# Patient Record
Sex: Male | Born: 1950
Health system: Southern US, Community
[De-identification: ages and names within clinical notes are randomized; demographics above are authoritative.]

## PROBLEM LIST (undated history)

## (undated) DIAGNOSIS — M545 Low back pain, unspecified: Secondary | ICD-10-CM

## (undated) DIAGNOSIS — K529 Noninfective gastroenteritis and colitis, unspecified: Secondary | ICD-10-CM

## (undated) DIAGNOSIS — M1990 Unspecified osteoarthritis, unspecified site: Secondary | ICD-10-CM

## (undated) DIAGNOSIS — I10 Essential (primary) hypertension: Secondary | ICD-10-CM

## (undated) DIAGNOSIS — J449 Chronic obstructive pulmonary disease, unspecified: Secondary | ICD-10-CM

## (undated) DIAGNOSIS — R51 Headache: Secondary | ICD-10-CM

## (undated) DIAGNOSIS — G629 Polyneuropathy, unspecified: Secondary | ICD-10-CM

## (undated) DIAGNOSIS — R0600 Dyspnea, unspecified: Secondary | ICD-10-CM

## (undated) DIAGNOSIS — I2510 Atherosclerotic heart disease of native coronary artery without angina pectoris: Secondary | ICD-10-CM

## (undated) DIAGNOSIS — R519 Headache, unspecified: Secondary | ICD-10-CM

## (undated) DIAGNOSIS — I219 Acute myocardial infarction, unspecified: Secondary | ICD-10-CM

## (undated) DIAGNOSIS — M542 Cervicalgia: Secondary | ICD-10-CM

## (undated) DIAGNOSIS — J189 Pneumonia, unspecified organism: Secondary | ICD-10-CM

## (undated) DIAGNOSIS — Z5181 Encounter for therapeutic drug level monitoring: Secondary | ICD-10-CM

## (undated) DIAGNOSIS — Z720 Tobacco use: Secondary | ICD-10-CM

## (undated) DIAGNOSIS — R918 Other nonspecific abnormal finding of lung field: Secondary | ICD-10-CM

## (undated) DIAGNOSIS — G709 Myoneural disorder, unspecified: Secondary | ICD-10-CM

## (undated) DIAGNOSIS — F1410 Cocaine abuse, uncomplicated: Secondary | ICD-10-CM

## (undated) DIAGNOSIS — J439 Emphysema, unspecified: Secondary | ICD-10-CM

## (undated) DIAGNOSIS — K219 Gastro-esophageal reflux disease without esophagitis: Secondary | ICD-10-CM

## (undated) DIAGNOSIS — M549 Dorsalgia, unspecified: Secondary | ICD-10-CM

## (undated) HISTORY — DX: Low back pain: M54.5

## (undated) HISTORY — DX: Gastro-esophageal reflux disease without esophagitis: K21.9

## (undated) HISTORY — DX: Tobacco use: Z72.0

## (undated) HISTORY — PX: COLONOSCOPY: SHX174

## (undated) HISTORY — DX: Cervicalgia: M54.2

## (undated) HISTORY — DX: Emphysema, unspecified: J43.9

## (undated) HISTORY — DX: Encounter for therapeutic drug level monitoring: Z51.81

## (undated) HISTORY — PX: CARDIAC CATHETERIZATION: SHX172

## (undated) HISTORY — DX: Dorsalgia, unspecified: M54.9

## (undated) HISTORY — DX: Chronic obstructive pulmonary disease, unspecified: J44.9

## (undated) HISTORY — PX: NO PAST SURGERIES: SHX2092

## (undated) HISTORY — DX: Low back pain, unspecified: M54.50

## (undated) HISTORY — DX: Noninfective gastroenteritis and colitis, unspecified: K52.9

## (undated) HISTORY — DX: Other nonspecific abnormal finding of lung field: R91.8

## (undated) HISTORY — DX: Cocaine abuse, uncomplicated: F14.10

---

## 1998-07-06 ENCOUNTER — Emergency Department (HOSPITAL_COMMUNITY): Admission: EM | Admit: 1998-07-06 | Discharge: 1998-07-06 | Payer: Self-pay | Admitting: Emergency Medicine

## 1998-07-10 ENCOUNTER — Emergency Department (HOSPITAL_COMMUNITY): Admission: EM | Admit: 1998-07-10 | Discharge: 1998-07-10 | Payer: Self-pay | Admitting: *Deleted

## 1998-07-10 ENCOUNTER — Encounter: Payer: Self-pay | Admitting: *Deleted

## 1999-12-19 ENCOUNTER — Encounter: Payer: Self-pay | Admitting: *Deleted

## 1999-12-19 ENCOUNTER — Encounter (INDEPENDENT_AMBULATORY_CARE_PROVIDER_SITE_OTHER): Payer: Self-pay | Admitting: *Deleted

## 1999-12-20 ENCOUNTER — Inpatient Hospital Stay (HOSPITAL_COMMUNITY): Admission: EM | Admit: 1999-12-20 | Discharge: 1999-12-21 | Payer: Self-pay | Admitting: *Deleted

## 2000-03-02 ENCOUNTER — Ambulatory Visit (HOSPITAL_COMMUNITY): Admission: RE | Admit: 2000-03-02 | Discharge: 2000-03-02 | Payer: Self-pay | Admitting: Gastroenterology

## 2000-04-24 ENCOUNTER — Encounter: Payer: Self-pay | Admitting: General Surgery

## 2000-04-24 ENCOUNTER — Encounter: Admission: RE | Admit: 2000-04-24 | Discharge: 2000-04-24 | Payer: Self-pay | Admitting: General Surgery

## 2000-05-13 ENCOUNTER — Emergency Department (HOSPITAL_COMMUNITY): Admission: EM | Admit: 2000-05-13 | Discharge: 2000-05-13 | Payer: Self-pay | Admitting: Emergency Medicine

## 2001-07-03 ENCOUNTER — Encounter: Admission: RE | Admit: 2001-07-03 | Discharge: 2001-07-03 | Payer: Self-pay | Admitting: Internal Medicine

## 2001-07-03 ENCOUNTER — Encounter: Payer: Self-pay | Admitting: Internal Medicine

## 2002-05-11 ENCOUNTER — Encounter: Payer: Self-pay | Admitting: Emergency Medicine

## 2002-05-11 ENCOUNTER — Emergency Department (HOSPITAL_COMMUNITY): Admission: EM | Admit: 2002-05-11 | Discharge: 2002-05-11 | Payer: Self-pay | Admitting: Emergency Medicine

## 2002-07-05 ENCOUNTER — Inpatient Hospital Stay (HOSPITAL_COMMUNITY): Admission: EM | Admit: 2002-07-05 | Discharge: 2002-07-06 | Payer: Self-pay | Admitting: Emergency Medicine

## 2002-07-05 ENCOUNTER — Encounter: Payer: Self-pay | Admitting: Emergency Medicine

## 2002-07-06 ENCOUNTER — Encounter: Payer: Self-pay | Admitting: Neurology

## 2004-01-27 ENCOUNTER — Emergency Department (HOSPITAL_COMMUNITY): Admission: EM | Admit: 2004-01-27 | Discharge: 2004-01-27 | Payer: Self-pay | Admitting: Emergency Medicine

## 2005-10-09 ENCOUNTER — Emergency Department (HOSPITAL_COMMUNITY): Admission: EM | Admit: 2005-10-09 | Discharge: 2005-10-09 | Payer: Self-pay | Admitting: Emergency Medicine

## 2007-05-21 ENCOUNTER — Emergency Department (HOSPITAL_COMMUNITY): Admission: EM | Admit: 2007-05-21 | Discharge: 2007-05-21 | Payer: Self-pay | Admitting: Emergency Medicine

## 2008-08-19 ENCOUNTER — Encounter: Admission: RE | Admit: 2008-08-19 | Discharge: 2008-08-19 | Payer: Self-pay | Admitting: Internal Medicine

## 2009-05-27 ENCOUNTER — Emergency Department (HOSPITAL_COMMUNITY): Admission: EM | Admit: 2009-05-27 | Discharge: 2009-05-27 | Payer: Self-pay | Admitting: Family Medicine

## 2009-08-31 ENCOUNTER — Emergency Department (HOSPITAL_COMMUNITY): Admission: EM | Admit: 2009-08-31 | Discharge: 2009-08-31 | Payer: Self-pay | Admitting: Family Medicine

## 2010-02-07 ENCOUNTER — Encounter
Admission: RE | Admit: 2010-02-07 | Discharge: 2010-02-07 | Payer: Self-pay | Source: Home / Self Care | Attending: Family Medicine | Admitting: Family Medicine

## 2010-03-01 ENCOUNTER — Encounter
Admission: RE | Admit: 2010-03-01 | Discharge: 2010-03-01 | Payer: Self-pay | Source: Home / Self Care | Attending: Internal Medicine | Admitting: Internal Medicine

## 2010-03-30 ENCOUNTER — Encounter: Payer: Self-pay | Admitting: Internal Medicine

## 2010-03-31 ENCOUNTER — Emergency Department (HOSPITAL_COMMUNITY): Payer: 59

## 2010-03-31 ENCOUNTER — Emergency Department (HOSPITAL_COMMUNITY)
Admission: EM | Admit: 2010-03-31 | Discharge: 2010-03-31 | Disposition: A | Discharge status: Home / Self Care | Payer: 59 | Attending: Emergency Medicine | Admitting: Emergency Medicine

## 2010-03-31 DIAGNOSIS — Z79899 Other long term (current) drug therapy: Secondary | ICD-10-CM | POA: Insufficient documentation

## 2010-03-31 DIAGNOSIS — R05 Cough: Secondary | ICD-10-CM | POA: Insufficient documentation

## 2010-03-31 DIAGNOSIS — R062 Wheezing: Secondary | ICD-10-CM | POA: Insufficient documentation

## 2010-03-31 DIAGNOSIS — R059 Cough, unspecified: Secondary | ICD-10-CM | POA: Insufficient documentation

## 2010-03-31 DIAGNOSIS — R0602 Shortness of breath: Secondary | ICD-10-CM | POA: Insufficient documentation

## 2010-03-31 DIAGNOSIS — F17200 Nicotine dependence, unspecified, uncomplicated: Secondary | ICD-10-CM | POA: Insufficient documentation

## 2010-03-31 DIAGNOSIS — J209 Acute bronchitis, unspecified: Secondary | ICD-10-CM | POA: Insufficient documentation

## 2010-04-18 ENCOUNTER — Emergency Department (HOSPITAL_COMMUNITY): Payer: 59

## 2010-04-18 ENCOUNTER — Emergency Department (HOSPITAL_COMMUNITY)
Admission: EM | Admit: 2010-04-18 | Discharge: 2010-04-19 | Disposition: A | Discharge status: Home / Self Care | Payer: 59 | Attending: Emergency Medicine | Admitting: Emergency Medicine

## 2010-04-18 DIAGNOSIS — J438 Other emphysema: Secondary | ICD-10-CM | POA: Insufficient documentation

## 2010-04-18 DIAGNOSIS — R0989 Other specified symptoms and signs involving the circulatory and respiratory systems: Secondary | ICD-10-CM | POA: Insufficient documentation

## 2010-04-18 DIAGNOSIS — I491 Atrial premature depolarization: Secondary | ICD-10-CM | POA: Insufficient documentation

## 2010-04-18 DIAGNOSIS — R062 Wheezing: Secondary | ICD-10-CM | POA: Insufficient documentation

## 2010-04-18 DIAGNOSIS — I498 Other specified cardiac arrhythmias: Secondary | ICD-10-CM | POA: Insufficient documentation

## 2010-04-18 DIAGNOSIS — R05 Cough: Secondary | ICD-10-CM | POA: Insufficient documentation

## 2010-04-18 DIAGNOSIS — J40 Bronchitis, not specified as acute or chronic: Secondary | ICD-10-CM | POA: Insufficient documentation

## 2010-04-18 DIAGNOSIS — R0609 Other forms of dyspnea: Secondary | ICD-10-CM | POA: Insufficient documentation

## 2010-04-18 DIAGNOSIS — R059 Cough, unspecified: Secondary | ICD-10-CM | POA: Insufficient documentation

## 2010-04-18 DIAGNOSIS — R079 Chest pain, unspecified: Secondary | ICD-10-CM | POA: Insufficient documentation

## 2010-04-18 LAB — DIFFERENTIAL
Eosinophils Relative: 12 % — ABNORMAL HIGH (ref 0–5)
Lymphocytes Relative: 33 % (ref 12–46)
Lymphs Abs: 4.2 10*3/uL — ABNORMAL HIGH (ref 0.7–4.0)
Monocytes Absolute: 0.6 10*3/uL (ref 0.1–1.0)

## 2010-04-18 LAB — URINALYSIS, ROUTINE W REFLEX MICROSCOPIC
Bilirubin Urine: NEGATIVE
Hgb urine dipstick: NEGATIVE
Specific Gravity, Urine: 1.011 (ref 1.005–1.030)
Urine Glucose, Fasting: NEGATIVE mg/dL
Urobilinogen, UA: 0.2 mg/dL (ref 0.0–1.0)

## 2010-04-18 LAB — CBC
HCT: 52.5 % — ABNORMAL HIGH (ref 39.0–52.0)
MCV: 87.5 fL (ref 78.0–100.0)
RDW: 14.1 % (ref 11.5–15.5)
WBC: 12.8 10*3/uL — ABNORMAL HIGH (ref 4.0–10.5)

## 2010-04-19 LAB — BASIC METABOLIC PANEL
CO2: 25 mEq/L (ref 19–32)
Calcium: 9.7 mg/dL (ref 8.4–10.5)
Chloride: 101 mEq/L (ref 96–112)
Glucose, Bld: 103 mg/dL — ABNORMAL HIGH (ref 70–99)
Potassium: 4 mEq/L (ref 3.5–5.1)
Sodium: 137 mEq/L (ref 135–145)

## 2010-05-02 ENCOUNTER — Other Ambulatory Visit: Payer: Self-pay | Admitting: Internal Medicine

## 2010-05-02 DIAGNOSIS — R918 Other nonspecific abnormal finding of lung field: Secondary | ICD-10-CM

## 2010-05-04 ENCOUNTER — Other Ambulatory Visit: Payer: 59

## 2010-05-18 ENCOUNTER — Inpatient Hospital Stay (HOSPITAL_COMMUNITY)
Admission: EM | Admit: 2010-05-18 | Discharge: 2010-05-20 | DRG: 192 | Disposition: A | Discharge status: Home / Self Care | Payer: 59 | Attending: Internal Medicine | Admitting: Internal Medicine

## 2010-05-18 ENCOUNTER — Emergency Department (HOSPITAL_COMMUNITY): Payer: 59

## 2010-05-18 ENCOUNTER — Encounter (HOSPITAL_COMMUNITY): Payer: Self-pay | Admitting: Radiology

## 2010-05-18 DIAGNOSIS — T380X5A Adverse effect of glucocorticoids and synthetic analogues, initial encounter: Secondary | ICD-10-CM | POA: Diagnosis present

## 2010-05-18 DIAGNOSIS — F17200 Nicotine dependence, unspecified, uncomplicated: Secondary | ICD-10-CM | POA: Diagnosis present

## 2010-05-18 DIAGNOSIS — R222 Localized swelling, mass and lump, trunk: Secondary | ICD-10-CM | POA: Diagnosis present

## 2010-05-18 DIAGNOSIS — R7309 Other abnormal glucose: Secondary | ICD-10-CM | POA: Diagnosis present

## 2010-05-18 DIAGNOSIS — Z8673 Personal history of transient ischemic attack (TIA), and cerebral infarction without residual deficits: Secondary | ICD-10-CM

## 2010-05-18 DIAGNOSIS — J441 Chronic obstructive pulmonary disease with (acute) exacerbation: Principal | ICD-10-CM | POA: Diagnosis present

## 2010-05-18 LAB — CK TOTAL AND CKMB (NOT AT ARMC)
CK, MB: 5 ng/mL — ABNORMAL HIGH (ref 0.3–4.0)
Total CK: 313 U/L — ABNORMAL HIGH (ref 7–232)

## 2010-05-18 LAB — DIFFERENTIAL
Lymphocytes Relative: 47 % — ABNORMAL HIGH (ref 12–46)
Lymphs Abs: 3.8 10*3/uL (ref 0.7–4.0)
Monocytes Absolute: 0.6 10*3/uL (ref 0.1–1.0)
Monocytes Relative: 8 % (ref 3–12)
Neutro Abs: 3.2 10*3/uL (ref 1.7–7.7)

## 2010-05-18 LAB — CBC
HCT: 47.3 % (ref 39.0–52.0)
Hemoglobin: 16.2 g/dL (ref 13.0–17.0)
MCH: 29.9 pg (ref 26.0–34.0)
MCHC: 34.2 g/dL (ref 30.0–36.0)
RBC: 5.42 MIL/uL (ref 4.22–5.81)

## 2010-05-18 LAB — BASIC METABOLIC PANEL
CO2: 24 mEq/L (ref 19–32)
Calcium: 9.6 mg/dL (ref 8.4–10.5)
Chloride: 112 mEq/L (ref 96–112)
GFR calc Af Amer: >60 mL/min (ref >60)
Sodium: 141 mEq/L (ref 135–145)

## 2010-05-18 LAB — TROPONIN I: Troponin I: 0.01 ng/mL (ref 0.00–0.06)

## 2010-05-18 LAB — POCT CARDIAC MARKERS: Troponin i, poc: <0.05 ng/mL (ref 0.00–0.09)

## 2010-05-18 LAB — BRAIN NATRIURETIC PEPTIDE: Pro B Natriuretic peptide (BNP): <30 pg/mL (ref 0.0–100.0)

## 2010-05-18 MED ORDER — IOHEXOL 300 MG/ML  SOLN
100.0000 mL | Freq: Once | INTRAMUSCULAR | Status: AC | PRN
  Administered 2010-05-18: 100 mL via INTRAVENOUS

## 2010-05-19 ENCOUNTER — Inpatient Hospital Stay (HOSPITAL_COMMUNITY): Payer: 59

## 2010-05-19 DIAGNOSIS — D381 Neoplasm of uncertain behavior of trachea, bronchus and lung: Secondary | ICD-10-CM

## 2010-05-19 DIAGNOSIS — R0602 Shortness of breath: Secondary | ICD-10-CM

## 2010-05-19 LAB — URINE DRUGS OF ABUSE SCREEN W ALC, ROUTINE (REF LAB)
Amphetamine Screen, Ur: NEGATIVE
Barbiturate Quant, Ur: NEGATIVE
Creatinine,U: 133.1 mg/dL
Ethyl Alcohol: <10 mg/dL (ref <10)
Marijuana Metabolite: POSITIVE — AB
Methadone: NEGATIVE
Phencyclidine (PCP): NEGATIVE

## 2010-05-19 LAB — CARDIAC PANEL(CRET KIN+CKTOT+MB+TROPI)
CK, MB: 4 ng/mL (ref 0.3–4.0)
CK, MB: 4.3 ng/mL — ABNORMAL HIGH (ref 0.3–4.0)
Relative Index: 1.5 (ref 0.0–2.5)
Troponin I: 0.03 ng/mL (ref 0.00–0.06)

## 2010-05-19 LAB — TSH: TSH: 1.036 u[IU]/mL (ref 0.350–4.500)

## 2010-05-19 LAB — CBC
HCT: 44.7 % (ref 39.0–52.0)
MCH: 30 pg (ref 26.0–34.0)
MCHC: 34.5 g/dL (ref 30.0–36.0)
Platelets: 182 10*3/uL (ref 150–400)

## 2010-05-19 LAB — COMPREHENSIVE METABOLIC PANEL
Albumin: 3.4 g/dL — ABNORMAL LOW (ref 3.5–5.2)
BUN: 14 mg/dL (ref 6–23)
Creatinine, Ser: 0.95 mg/dL (ref 0.4–1.5)
Potassium: 4.2 mEq/L (ref 3.5–5.1)
Total Protein: 6.7 g/dL (ref 6.0–8.3)

## 2010-05-19 LAB — LIPID PANEL
Cholesterol: 204 mg/dL — ABNORMAL HIGH (ref 0–200)
HDL: 63 mg/dL (ref >39)
LDL Cholesterol: 132 mg/dL — ABNORMAL HIGH (ref 0–99)
Total CHOL/HDL Ratio: 3.2 RATIO
VLDL: 9 mg/dL (ref 0–40)

## 2010-05-19 LAB — GLUCOSE, CAPILLARY
Glucose-Capillary: 100 mg/dL — ABNORMAL HIGH (ref 70–99)
Glucose-Capillary: 141 mg/dL — ABNORMAL HIGH (ref 70–99)

## 2010-05-20 ENCOUNTER — Other Ambulatory Visit (HOSPITAL_COMMUNITY): Payer: 59

## 2010-05-20 DIAGNOSIS — D381 Neoplasm of uncertain behavior of trachea, bronchus and lung: Secondary | ICD-10-CM

## 2010-05-20 LAB — BASIC METABOLIC PANEL
CO2: 27 mEq/L (ref 19–32)
Calcium: 9 mg/dL (ref 8.4–10.5)
Chloride: 108 mEq/L (ref 96–112)
Creatinine, Ser: 0.91 mg/dL (ref 0.4–1.5)
GFR calc Af Amer: >60 mL/min (ref >60)
Glucose, Bld: 111 mg/dL — ABNORMAL HIGH (ref 70–99)

## 2010-05-20 LAB — PROTIME-INR
INR: 0.94 (ref 0.00–1.49)
Prothrombin Time: 12.8 seconds (ref 11.6–15.2)

## 2010-05-20 LAB — GLUCOSE, CAPILLARY

## 2010-05-20 LAB — APTT: aPTT: 27 seconds (ref 24–37)

## 2010-05-20 LAB — HEMOGLOBIN A1C: Mean Plasma Glucose: 143 mg/dL — ABNORMAL HIGH (ref <117)

## 2010-05-24 LAB — THC (MARIJUANA), URINE, CONFIRMATION: Marijuana, Ur-Confirmation: 59 NG/ML — ABNORMAL HIGH

## 2010-05-27 ENCOUNTER — Other Ambulatory Visit: Payer: Self-pay | Admitting: Cardiothoracic Surgery

## 2010-05-27 DIAGNOSIS — IMO0002 Reserved for concepts with insufficient information to code with codable children: Secondary | ICD-10-CM

## 2010-05-27 NOTE — Consult Note (Signed)
NAMELAY, GEHRES           ACCOUNT NO.:  [id number]  MEDICAL RECORD NO.:  [id number]           PATIENT TYPE:  I  LOCATION:  [id number]                         FACILITY:  [facility]  PHYSICIAN:  Ivin Poot, M.D.  DATE OF BIRTH:  1951-02-07  DATE OF CONSULTATION:  05/19/2010 DATE OF DISCHARGE:                                CONSULTATION   REQUESTING PHYSICIAN:  Royetta Crochet. Karlton Lemon, MD  REASON FOR CONSULTATION:  Right upper lobe pleural base density by 5 x 0.5 cm.  CHIEF COMPLAINT:  Acute bronchitis and shortness of breath.  HISTORY OF PRESENT ILLNESS:  I was asked to evaluate this 60 year old African American male smoker for further evaluation and diagnosis of a pleural-based right upper lobe mass measuring 5 x 1.5 cm.  There was no discrete pulmonary mass and no abnormal mediastinal nodes.  The patient does have severe bullous emphysema and severe COPD and was admitted for a refractory episode of prolonged bronchitis with productive cough, shortness of breath, congestion, and he is now on IV antibiotics, steroids, and nebulized therapy.  This mass is new since his last chest x-ray in 2004.  He denies fever, weight loss, hemoptysis.  He has never had any significant trauma to his right chest.  He denies tenderness over the area of the mass on CT scan.  The radiologist felt this could represent either a primary bronchogenic cancer versus a fibrous tumor of the pleura versus a loculated effusion or scar.  The patient does have a strong and heavy history of smoking, still actively smokes.  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  PAST MEDICAL HISTORY: 1. COPD. 2. Tobacco abuse and polysubstance abuse. 3. History of headaches and TIAs. 4. History of small bowel obstruction, not requiring  surgery.  SOCIAL HISTORY:  The patient is retired, married, lives with his wife, and smokes cigarettes, and alcohol use is occasional.  HOME MEDICATIONS:  Ventolin inhaler, Atrovent inhaler, Robaxin for back pain, albuterol inhaler, and Skelaxin t.i.d.  ALLERGIES:  PENICILLIN.  SURGICAL HISTORY:  Negative, but he is receiving apparent steroid epidural injections for low back pain at an orthopedic office here in [place].  REVIEW OF SYSTEMS:  CONSTITUTIONAL:  Review is negative fever or weight loss.  ENT:  Review is significant for some dentures.  No difficulty swallowing or active dental complaints.  THORACIC:  Review is significant for his active COPD/bronchitis flare-up.  CARDIAC:  Review is significant for a recent 2-D echo which showed good LV function.  No significant valvular disease.  No significant pericardial effusion.  GI: Review is positive for some problem with constipation but no blood per rectum.  ENDOCRINE:  Negative diabetes.  VASCULAR:  Negative for DVT, claudication.  NEUROLOGIC:  Positive for low back pain.  PHYSICAL EXAMINATION:  VITAL SIGNS:  He is 6 feet 1 inches, weighs 86 kg.  Blood pressure 142/77, pulse 90 sinus, temperature 97.9, respirations 20, saturation on 2 liters 96%. GENERAL  APPEARANCE:  This is a middle-aged [place] American male who gets short of breath with long conversation or walking around the bed. HEENT:  Normocephalic.  Pupils are equal.  Upper dentures intact. NECK:  Without JVD, mass, or bruit. LYMPHATICS:  No palpable supraclavicular or cervical adenopathy.  Breath sounds are distant with scattered wheezing and rhonchi pronounced.  No thoracic abnormality or deformity. CARDIAC:  Rhythm is regular without murmur. ABDOMEN:  Soft without pulsatile mass. EXTREMITIES:  Reveals some clubbing, but no cyanosis or tenderness. Peripheral pulses are intact. NEUROLOGIC:  Alert and oriented without focal motor deficit.  His white count on  admission was 8.2, glucose 102.  Cardiac enzymes negative, BNP negative, and hemoglobin 15.2.  His chest CT scan was reviewed.  IMPRESSION AND PLAN:  The patient is a 60 year old African American male who is admitted with chronic obstructive pulmonary disease flare-up and is being aggressively treated with IV antibiotics and nebulized bronchodilators and IV steroids.  The pleural base mass is an incidental finding on chest x-ray and is probably not contributing to his acute symptoms.  However, this is a suspicious density.  He will need to be further evaluated.  I strongly doubt the patient would be a candidate for general anesthesia or VATS resection or biopsy due to his severe chronic obstructive pulmonary disease.  We will proceed with room air blood gas in the form of PFTs during this hospitalization.  Since he is not a surgical candidate, I will arrange for a needle core biopsy under CT guidance and then if this shows a malignancy, we would proceed with a PET scan as an outpatient with a referral to oncology for nonsurgical treatment.  This plan was discussed with the patient including the specifics of the CT directed biopsy.  He understands and agrees.  We will follow up with results of these studies.     Ivin Poot, M.D.     PV/MEDQ  D:  05/19/2010  T:  05/20/2010  Job:  [id number]  Electronically Signed by Ivin Poot M.D. on 05/27/2010 01:14:22 PM

## 2010-05-27 NOTE — Discharge Summary (Signed)
NAMEMONDALE, DUTCHER           ACCOUNT NO.:  [id number]  MEDICAL RECORD NO.:  [id number]           PATIENT TYPE:  I  LOCATION:  [id number]                         FACILITY:  [facility]  PHYSICIAN:  Nat Math, MD      DATE OF BIRTH:  10/16/1950  DATE OF ADMISSION:  05/18/2010 DATE OF DISCHARGE:  05/20/2010                        DISCHARGE SUMMARY - REFERRING   PRIMARY CARE PHYSICIAN:  Joelene Millin R. Karlton Lemon, MD  DISCHARGE DIAGNOSES: 1. Shortness of breath most likely secondary to acute exacerbation of     chronic obstructive pulmonary disease. 2. Right-sided pleural-based mass of size 1.4 x 6.4 cm, thought to be     benign.  Needs follow up CT of the chest with contrast in 6 months     time to assess progression. 3. Hyperglycemia, steroid induced. 4. Previous history of polysubstance abuse. 5. Ongoing tobacco abuse. 6. Previous history of transient ischemic attack.  DISCHARGE MEDICATIONS: 1. Azithromycin 250 mg daily for 4 more days. 2. Mucinex 1200 mg twice daily for 4 more days. 3. Prednisone taper. 4. Advair 250/50 one puff twice daily. 5. Prilosec 20 mg daily for 10 days. 6. Albuterol MDI 2 puffs every 4 hours as needed. 7. Atrovent 1 inhalation every 6 hours as needed. 8. Skelaxin 800 mg 3 times daily.  CONSULTING PHYSICIANS: 1. Ivin Poot, MD Cardiothoracic Surgery. 2. Eulas Post T. Kathlene Cote, MD Interventional Radiology.  PROCEDURES PERFORMED: 1. CT of the chest with contrast on May 18, 2010 showed severe     centrilobular emphysema with bullous disease  at the left apex and     also showed no evidence of pulmonary embolism and a pleural-based     right upper lobe mass with no thoracic adenopathy. 2. A 2-D echocardiogram on May 19, 2010 showed ejection fraction 65-     70% with no wall motion or valvular abnormalities.  HOSPITAL COURSE:  Mr. Hanlin is a very pleasant 60 year old male with history of tobacco habituation who came in with complaints of  worsening shortness of breath associated with cough.  He has been in and out of the emergency room in the last 2 months and was found to have a pleural- based mass with concerns for malignancy.  He had evidence of COPD exacerbation and was started on bronchodilators, oxygen supplementation, systemic steroids and azithromycin to which he responded very well during the short hospital stay.  Cardiothoracic Surgery Dr. Prescott Gum was consulted to decide on way for with the pleural-based mass and his initial recommendation had been for CT-guided biopsy of the mass. Radiology reports he discussed further with Dr. Kathlene Cote, Interventional Radiology who felt that this mass was unchanged from January 2012 and that it was most likely benign as it had smooth margin and has not changed in size.  The consensus was for the patient to have a followup CT scan of the chest with contrast in 6 months.  He needs pulmonary function tests to assess the severity of the COPD and may be a candidate for reduction surgery depending on pulmonary opinion on which I am deferring to the patient's primary care provider.  The patient follows very closely with Dr.  Karlton Lemon and he and his wife will call for an appointment.  Otherwise, I spent quite some time discussing his need to quit smoking as a single almost factor that could change the trajectory of what is obviously already bad COPD with concerns for malignancy.  He will work with his primary care provider to try to quit.  Otherwise today he is much better.  Labs remarkable for normal electrolytes.  Drug urine, drug screen positive for marijuana.  Lipids panel showing total cholesterol 204, HDL 63, LDL 132.  CBC, thyroid function tests normal. BNP less than 30.  He is discharged in a stable condition and should follow with Dr. Willey Blade in the next 1-2 weeks.  He should follow with Dr. Willey Blade in 1-2 weeks and should call Dr. Lucianne Lei Trigt's office for  further followup.  The time spent for discharge preparation is less than 30 minutes.     Nat Math, MD     SR/MEDQ  D:  05/20/2010  T:  05/20/2010  Job:  [id number]  cc:   Royetta Crochet. Karlton Lemon, M.D. Ivin Poot, M.D. Venetia Night. Kathlene Cote, M.D.  Electronically Signed by Nat Math  on 05/27/2010 02:08:30 AM

## 2010-05-30 NOTE — H&P (Signed)
Peter Sims, Peter Sims           ACCOUNT NO.:  [id number]  MEDICAL RECORD NO.:  [id number]           PATIENT TYPE:  E  LOCATION:  [facility]                         FACILITY:  [facility]  PHYSICIAN:  Rise Patience, MDDATE OF BIRTH:  05/05/1950  DATE OF ADMISSION:  05/18/2010 DATE OF DISCHARGE:                             HISTORY & PHYSICAL   PRIMARY CARE PHYSICIAN:  Royetta Crochet. Karlton Lemon, MD  CHIEF COMPLAINT:  Shortness of breath.  HISTORY OF PRESENT ILLNESS:  A 60 year old male with known history of COPD on nebulizer, has been experiencing shortness of breath which has worsened over the last 1 month.  The patient did originally come to ER a month ago wherein he was given a steroid tapering, during which he felt better, after that steroids were off, he started developing shortness of breath again.  The patient is short of breath both at rest and at exertion, has some cough with nonproductive sputum.  Denies any fever orchills.  Denies any dizziness, loss of consciousness, any nausea, vomiting, abdominal pain, dysuria, discharge or diarrhea.  The patient denies any focal deficit, headache or visual symptoms.  In the ER, the patient was found to have bilateral expiratory wheeze. At this time CT angio chest was done which shows right-sided pleural- based mass which he needs further workup.  The patient has been admitted for COPD exacerbation.  In addition, the patient has some chest pain when he takes deep breath.  Otherwise, he has no chest pain on exertion or at rest.  PAST MEDICAL HISTORY:  COPD, ongoing tobacco abuse, previous history of polysubstance abuse and TIAs early from that and small bowel obstruction years ago wherein the patient had colonoscopy and he states that it relieved.  MEDICATIONS PRIOR TO ADMISSION:  Nebulizers and albuterol p.r.n.  SOCIAL HISTORY:  The patient is married, lives with his wife, smokes cigarettes, alcohol very occasionally.  He used to abuse  cocaine, has stopped using it more than 10 years as per patient.  ALLERGIES:  PENICILLIN.  FAMILY HISTORY:  Father died from cancer, not sure what type.  REVIEW OF SYSTEMS:  As per history of present illness nothing else significant.  PHYSICAL EXAMINATION:  GENERAL:  The patient is examined at bedside not in acute distress. VITAL SIGNS:  Blood pressure is 133/89, pulse of 100 per minute, temperature 97.4, respirations is 22-24 per minute, O2 sat is 96. HEENT:  Anicteric.  No pallor.  No discharge from ears, eyes, nose and mouth. CHEST:  Bilateral air entry present, bilateral expiratory wheeze.  No crepitation. HEART:  S1 and S2 heard. ABDOMEN:  Soft, nontender.  Bowel sounds present. CNS:  Alert, awake and oriented to time, place and person.  Moves upper and lower extremities 5/5, symmetric.  Peripheral pulses are felt.  No edema.  LABORATORY FINDINGS:  EKG shows a normal sinus rhythm with premature atrial complexes with aberrant conduction, nonspecific ST-T wave changes with poor R-wave progression.  Chest CT angio of chest, poor to moderate heart evaluation, pulmonary embolism secondary to motion artifact.  No large embolism identified.  Plain film abnormality corresponds to a pleural-based right upper lobe mass.  Differential considerations  include primary bronchogenic carcinoma was likely a complex locular pleural effusion or a fibrous tumor of the pleura.  Consider thoracic surgery consultation, no thoracic adenopathy, severe frontal lobar infarction with bullous disease at the left apex.  CBC; WBC is 8.2, hemoglobin is 15.2, hematocrit 47.3, platelets 204, neutrophils 39%, lymphocytes 47%, eosinophil 6%.  Basic metabolic panel; sodium [id number], potassium 4, chloride 112, carbon dioxide 25, glucose 102, BUN 15, creatinine 0.9, calcium 9.6, CK-MB 3.4, troponin less than 0.05, myoglobin is 173, BNP less than 30.  ASSESSMENT: 1. Shortness of breath, most likely chronic  obstructive pulmonary     disease exacerbation. 2. Pleural-based right upper lobe mass. 3. Ongoing tobacco abuse. 4. Previous history of polysubstance abuse. 5. Previous history of transient ischemic attack. 6. Eosinophilia.  PLAN: 1. At this time we will admit the patient to telemetry. 2. For his COPD exacerbation, the patient will be placed on nebulizer,     IV steroids, slowly change to p.o. prednisone, antibiotics and     Pulmicort.  The patient also has some exertional dyspnea which     could be from his severe COPD, but at this time I will be getting a     2-D echo. 3. Pleuritic type of chest pain.  We will cycle cardiac markers.  As     suggested earlier, the patient will be getting a 2-D echo. 4. Right-sided pleural-based right upper lobe mass.  We will need to     get a CT Surgery consult in a.m. for further recommendations.  5.     Further recommendation as condition evolves.     Rise Patience, MD     ANK/MEDQ  D:  05/18/2010  T:  05/18/2010  Job:  [id number]  cc:   Royetta Crochet. Karlton Lemon, M.D.  Electronically Signed by Gean Birchwood MD on 05/30/2010 08:02:20 AM

## 2010-06-14 ENCOUNTER — Encounter (HOSPITAL_COMMUNITY)
Admission: RE | Admit: 2010-06-14 | Discharge: 2010-06-14 | Disposition: A | Discharge status: Home / Self Care | Payer: 59 | Source: Ambulatory Visit | Attending: Cardiothoracic Surgery | Admitting: Cardiothoracic Surgery

## 2010-06-14 DIAGNOSIS — R222 Localized swelling, mass and lump, trunk: Secondary | ICD-10-CM | POA: Insufficient documentation

## 2010-06-14 DIAGNOSIS — K5730 Diverticulosis of large intestine without perforation or abscess without bleeding: Secondary | ICD-10-CM | POA: Insufficient documentation

## 2010-06-14 DIAGNOSIS — I2510 Atherosclerotic heart disease of native coronary artery without angina pectoris: Secondary | ICD-10-CM | POA: Insufficient documentation

## 2010-06-14 DIAGNOSIS — IMO0002 Reserved for concepts with insufficient information to code with codable children: Secondary | ICD-10-CM

## 2010-06-14 DIAGNOSIS — K449 Diaphragmatic hernia without obstruction or gangrene: Secondary | ICD-10-CM | POA: Insufficient documentation

## 2010-06-14 LAB — GLUCOSE, CAPILLARY: Glucose-Capillary: 115 mg/dL — ABNORMAL HIGH (ref 70–99)

## 2010-06-14 MED ORDER — FLUDEOXYGLUCOSE F - 18 (FDG) INJECTION
17.0000 | Freq: Once | INTRAVENOUS | Status: AC | PRN
  Administered 2010-06-14: 17 via INTRAVENOUS

## 2010-06-15 ENCOUNTER — Ambulatory Visit: Payer: 59 | Admitting: Cardiothoracic Surgery

## 2010-06-22 ENCOUNTER — Ambulatory Visit (INDEPENDENT_AMBULATORY_CARE_PROVIDER_SITE_OTHER): Payer: 59 | Admitting: Cardiothoracic Surgery

## 2010-06-22 DIAGNOSIS — J449 Chronic obstructive pulmonary disease, unspecified: Secondary | ICD-10-CM

## 2010-06-22 DIAGNOSIS — D491 Neoplasm of unspecified behavior of respiratory system: Secondary | ICD-10-CM

## 2010-06-23 NOTE — Assessment & Plan Note (Signed)
OFFICE VISIT  EMERT, SORGI DOB:  06/04/1950                                        June 22, 2010 CHART #:  [id number]  PREOPERATIVE DIAGNOSES: 1. Right upper lobe pleural-based mass negative on PET scan. 2. Severe bilateral bullous emphysema. 3. Active smoking.  POSTOPERATIVE DIAGNOSES: 1. Right upper lobe pleural-based mass negative on PET scan. 2. Severe bilateral bullous emphysema. 3. Active smoking.  HISTORY OF PRESENT ILLNESS:  Peter Sims is a 60 year old African American male who was seen in the hospital after a CT scan showed a pleural-based mass in the right upper lobe.  This was felt to be a benign process possibly related to his COPD.  The patient was admitted to the medical service with acute COPD flare-up and was treated with steroids antibiotics with improved symptoms.  He was discharged and subsequently underwent a PET scan.  PET scan showed no metabolic activity of the pleural-based mass consistent with a scar.  He did however show some subcentimeter subcarinal nodes with no metabolic activity on PET and some subcentimeter lymph nodes in the AP window with SUV of 3.5.  He presents now for review of the PET scan and further review of the situation.  Unfortunately he continues to smoke.  PHYSICAL EXAMINATION:  VITAL SIGNS:  Blood pressure 127/80, pulse 87, respirations 20, saturation 98% on room air, weight 190 pounds, height 6 feet 1 inches.  He has clear COPD.  He is somewhat anxious. LUNGS:  Breath sounds are with scattered wheezing bilaterally. NECK:  He is no crepitus in the neck or cervical adenopathy or mass. CARDIAC:  Rhythm is regular. EXTREMITIES:  Nontender. NEUROLOGIC:  Intact.  LABORATORY DATA:  The PET scan is reviewed.  He has several subcentimeter mediastinal nodes probably related to his COPD with recent flare-up.  The right upper lobe pleural-based mass appears to be benign and will not need further  followup.  The AP window lymph node, which is subcentimeter has mild activity, but has been associated with a recent COPD flare-up.  I would not recommend biopsy of this node at this time rather follow up with a CT scan in approximately 3 months to see if the node is changed in size.  Again he was strongly advised to stop smoking. He is given a prescription for Advair Disk to help with his wheezing. He will return to the care of his primary care physician, Dr. Heath Gold and we will follow him along for his adenopathy.  Of note, spirometry today shows FEV-1 of 2.1 and FVC of 2.9, approximately 60% of predicted.  Peter Sims, M.D. Electronically Signed  PV/MEDQ  D:  06/22/2010  T:  06/23/2010  Job:  [id number]

## 2010-07-15 NOTE — H&P (Signed)
NAME:  Peter Sims, Peter Sims                     ACCOUNT NO.:  [id number]   MEDICAL RECORD NO.:  [id number]                   PATIENT TYPE:  INP   LOCATION:  3020                                 FACILITY:  [facility]   PHYSICIAN:  Jill Alexanders, M.D.               DATE OF BIRTH:  [birth year]   DATE OF ADMISSION:  07/05/2002  DATE OF DISCHARGE:  07/06/2002                                HISTORY & PHYSICAL   HISTORY OF PRESENT ILLNESS:  The patient is a 60 year old black male born  [birth year] with a history of cocaine abuse in the past.  This patient has been  drug-free for some time, but apparently fell off the wagon in the last 24  hours and has been on 13-hour binge using cocaine and alcohol. The patient  last used cocaine today around 11 a.m.  The patient began noting around 1:30  p.m. some left facial and arm numbness, chest pressure, palpitations.  The  patient also complained of some nausea, but no vomiting.  The patient was  brought to the emergency room  by EMS.  CT scan of the brain was done and is  unremarkable.  This patient has received some medication for headache  resulting in oversedation.  Neurology was called for further evaluation at  this time.  The patient has partially responded to Narcan.   PAST MEDICAL HISTORY:  1. Significant for cocaine abuse.  2. Gastroesophageal reflux disease.  3. New onset of left-sided face and arm numbness.  No clear evidence of     weakness on that side.  The patient denies any prior history of surgery.   MEDICATIONS:  Nexium 40 mg daily.   ALLERGIES:  PENICILLIN.   SOCIAL HISTORY:  Smokes a pack of cigarettes a day.  Does not drink alcohol  on a regular basis, but was drinking prior to coming in here.  Has been  using crack cocaine.   SOCIAL HISTORY:  The patient is married and has four children.  Lives in the  [place] area.  Lives with his family.   FAMILY HISTORY:  Notable that father died with cancer. Mother also passed  away.   It is not clear what the cause.  The patient is unable to list any  further family history.   REVIEW OF SYSTEMS:  Notable for problems with headache.  Denies neck pain.  Does note some shortness of breath, some nausea.  Denies chest pain.  The  patient does note some numbness. Denies any blackout episodes, seizure  problems in the past.   PHYSICAL EXAMINATION:  VITAL SIGNS:  Blood pressure is 140/90, heart rate  120, respiratory rate 20.  GENERAL:  This patient is a well-developed black male who is very sleepy at  the time of examination.  HEENT:  Head is atraumatic.  Pupils are 2 mm, trace reactive.  Disks are  flat.  NECK:  Supple.  No carotid bruits noted.  RESPIRATORY:  Clear.  CARDIOVASCULAR:  Regular  rate and rhythm.  No obvious murmurs or rubs  noted.  ABDOMEN:  Reveals positive bowel sounds.  No organomegaly or tenderness  noted.  EXTREMITIES:  Without significant edema.  NEUROLOGIC:  Cranial nerves as above. Facial symmetry is present.  The  patient has no obvious asymmetry to pin prick sensation throughout, but  patient is quite drowsy at this time.  Will move all fours fairly  symmetrically and no obvious drift is seen. The patient will not follow  commands well enough for cerebellar testing due to excessive drowsiness.  Deep tendon reflexes are depressed, but symmetric.  Toes are downgoing  bilaterally.   LABORATORY DATA:  White count 7.5, hemoglobin 15.3, hematocrit 46.4, MCV  88.6, platelets 214, sodium 138, potassium 3.6, chloride 104, CO2 24,  glucose 112, BUN 8, creatinine 0.9, calcium 9.4, total protein 7.1, albumin  3.7, AST 21, ALT 23, alkaline phosphatase 90, total bilirubin 0.7.  Alcohol  level of 30.  Drug screen is pending.   EKG reveals normal sinus rhythm, normal EKG.  Heart rate of 98.  CT of the  head is unremarkable by my review.   IMPRESSION:  1. History of cocaine abuse.  2. New onset of left face and arm numbness.  Rule out cerebrovascular      infarction.   The patient has been given Nubain 10 mg,  Ativan 2 mg and Phenergan 12.5 mg  and is oversedated at this point and has responded partially to Narcan  suggesting that the altered mental status is medication related.  Will admit  this patient at this time for further workup of possible stroke event.   PLAN:  1. Admission to [facility] monitored bed.  2. MRI of the brain.  3. MR angiogram.  4. 2-D echocardiogram.  5. Drug screen.  6. Check cardiac enzymes in the a.m.  7. Followup patient's clinical course while inhouse.                                               Jill Alexanders, M.D.    CKW/MEDQ  D:  07/05/2002  T:  07/07/2002  Job:  [id number]   cc:   [facility]  [address]  [address]

## 2010-07-15 NOTE — Discharge Summary (Signed)
NAME:  Peter Sims, Peter Sims                     ACCOUNT NO.:  [id number]   MEDICAL RECORD NO.:  [id number]                   PATIENT TYPE:  INP   LOCATION:  3020                                 FACILITY:  [facility]   PHYSICIAN:  Jill Alexanders, M.D.               DATE OF BIRTH:  1950-12-27   DATE OF ADMISSION:  07/05/2002  DATE OF DISCHARGE:  07/06/2002                                 DISCHARGE SUMMARY   ADMISSION DIAGNOSES:  1. Onset of left sensory changes, rule out cerebrovascular infarction.  2. Cocaine abuse.   DISCHARGE DIAGNOSES:  1. Cocaine abuse.  2. Cerebrovascular spasm with transient ischemic attack, right brain.   PROCEDURES:  1. Computed tomography of the head.  2. Magnetic resonance imaging of the brain.  3. Magnetic resonance imaging angiogram.   COMPLICATIONS OF ABOVE PROCEDURES:  None.   HISTORY OF PRESENT ILLNESS:  Peter Sims is a 60 year old black  male born [birth year] with a history cocaine abuse in the past.  The patient  had been doing well in this regard but fell off the wagon on the day of  admission, sustaining a 13 hour bout of cocaine abuse associated with  alcohol intake.  The patient began noting the onset of left face, left arm  numb, tingling sensation.  He was concerned that he was having a stroke.  He  then called the EMS and came to the hospital for an evaluation.  This  patient underwent a CT scan of the brain that was unremarkable, was admitted  for further evaluation.  The patient was treated with aspirin and IV fluids.   PAST MEDICAL HISTORY:  1. History of cocaine abuse.  2. Gastroesophageal reflux disease.  3. New onset left-sided facial and arm paresthesias, rule out     cerebrovascular infarction.   Medications include Nexium 40 mg a day.  He has an allergy to PENICILLIN.  He smokes a pack of cigarettes a day.  He does drink alcohol.  He has a  history of cocaine abuse.   Please refer to history and physical for social  history, family history,  review of systems, and physical examination.   LABORATORY DATA:  Notable for hemoglobin 15.3, hematocrit 46.4, platelets  214, white count of 7.5.  Sodium 138, potassium 3.6, chloride 104, CO2 24,  BUN of 8, creatinine 0.9, glucose of 112, SGOT of 21, SGPT of 23, alkaline  phosphatase of 90, total Bili of 0.7, calcium of 9.4, CK level of 291, MB  fraction of 2.9.  Homocystine level is pending at this time.  Lipid profile  is pending.  Alcohol level of 30.  Drug screen is positive for cocaine and  for THC.  Otherwise, drug screen was negative.   EKG reveals normal sinus rhythm, normal EKG, heart rate 98.   HOSPITAL COURSE:  This patient was admitted to [facility] for evaluation.  The patient was treated with IV fluids  and aspirin therapy.  The patient was  quite sedated initially as he was given narcotics and antiemetics in the  emergency room.  The patient did suffer some nausea initially but this  cleared.  The patient was set up for an MRI of the brain which has been done  showing evidence of increased white matter signal in the right deep parietal  white matter that does not have a full correlate with diffusion-weighted  positive imaging on the scan.  This was felt related to vasospasm and not  actual infarction.  An MI angiogram was unremarkable with intracranial and  extracranial vessels.  The patient has had normalization of symptomatology  and feels at his baseline.  The patient will be discharged to home on  aspirin 81 mg a day.  The patient will be on Nexium 40 mg daily.  He is to  refrain from the use of cocaine.  Again, the patient was found to have  cocaine, alcohol and marijuana in his system at the time of this admission.  Two echocardiograms initially were ordered but have been cancelled.  Plan to  follow up with this patient in 4-6 weeks.  Diagnosis code 433.10.                                               Jill Alexanders, M.D.     CKW/MEDQ  D:  07/06/2002  T:  07/08/2002  Job:  [id number]   cc:   [facility]  [id number]

## 2010-07-15 NOTE — Procedures (Signed)
[facility]  Patient:    Peter Sims, Peter Sims                  MRN: [id number] Proc. Date: 12/21/99 Adm. Date:  [id number] Attending:  Juanita Craver CC:         Royetta Crochet. Karlton Lemon, M.D.   Procedure Report  DATE OF BIRTH:  1950/10/30  REFERRING PHYSICIAN:  Royetta Crochet. Karlton Lemon, M.D.  PROCEDURE PERFORMED:  Colonoscopy with biopsies.  ENDOSCOPIST:  Nelwyn Salisbury, M.D.  INSTRUMENT USED:  Olympus video colonoscope.  INDICATIONS FOR PROCEDURE:  Rectal bleeding in a 60 year old black male with severe left lower quadrant abdominal pain rule out ischemic colitis, masses, polyps.  PREPROCEDURE PREPARATION:  Informed consent was procured from the patient. The patient was fasted for eight hours prior to the procedure and prepped with a bottle of magnesium citrate and a gallon of NuLytely the night prior to the procedure.  PREPROCEDURE PHYSICAL:  The patient had stable vital signs.  Neck supple. Chest clear to auscultation.  S1, S2 regular.  Abdomen soft with normal abdominal bowel sounds.  DESCRIPTION OF PROCEDURE:  The patient was placed in the left lateral decubitus position and sedated with 75 mg of Demerol and 8 mg of Versed intravenously.  Once the patient was adequately sedated and maintained on low-flow oxygen and continuous cardiac monitoring, the Olympus video colonoscope was advanced from the rectum to the cecum without difficulty. There was a patchy area of erythema from 30 to 35 cm that was biopsied for ischemic colitis.  No frank ulceration was seen.  No masses or polyps were present.  There was evidence of left-sided diverticulosis.  The procedure was complete up to the cecum and terminal ileum.  IMPRESSION: 1. Normal-appearing cecum and terminal ileum. 2. Normal right colon and transverse colon. 3. Patchy erythema at 30 to 35 cm consistent with ischemic colitis, biopsies    done, results pending. 4. Left-sided  diverticulosis.  RECOMMENDATIONS: 1. The patient has been advised to avoid nonsteroidals. 2. A high fiber diet has been advocated for him.  He is to start on a soft    diet today and gradually advance his diet as tolerated. 3. He is strongly advised against smoking. 4. Outpatient follow-up is advised in the next two weeks. DD:  12/21/99 TD:  12/21/99 Job: [id number]

## 2010-07-15 NOTE — Procedures (Signed)
[name]  Patient:    Peter Sims, Peter Sims                  MRN: [id number] Proc. Date: 03/02/00 Adm. Date:  [id number] Attending:  Juanita Craver CC:         Royetta Crochet. Karlton Lemon, M.D.   Procedure Report  DATE OF BIRTH:  03-Jun-1950.  PROCEDURE:  Flexible sigmoidoscopy.  ENDOSCOPIST:  Nelwyn Salisbury, M.D.  INSTRUMENT USED:  Olympus video colonoscope.  INDICATION FOR PROCEDURE:  Severe left lower quadrant pain with painful defecation in a 60 year old African-American male who has had a history of ischemic colitis in the past.  Rule out ongoing ischemia.  PREPROCEDURE PREPARATION:  Informed consent was procured from the patient. The patient was fasted for eight hours prior to the procedure and prepped with a bottle of Fleets Phospho-Soda the night prior to the procedure and two Fleets enemas the morning of the procedure.  PREPROCEDURE PHYSICAL:  VITAL SIGNS:  The patient had stable vital signs.  NECK:  Supple.  CHEST:  Clear to auscultation.  S1, S2 regular.  ABDOMEN:  Soft with normal abdominal bowel sounds, with significant left lower quadrant tenderness on palpation.  DESCRIPTION OF PROCEDURE:  The patient was placed in the left lateral decubitus position and sedated with 50 mg of Demerol and 5 mg of Versed intravenously.  Once the patient was adequately sedate and maintained on low-flow oxygen and continuous cardiac monitoring, the Olympus video colonoscope was advanced from the rectum to 70 cm without difficulty.  There was significant left-sided diverticular disease.  No masses, polyps, erosions, ulcerations, or ischemia were seen.  Small internal hemorrhoids were appreciated on retroflexion.  IMPRESSION: 1. Left-sided diverticulosis. 2. Small, nonbleeding internal hemorrhoids.  RECOMMENDATIONS: 1. The patient has been advised to increase the fluids and fiber in his diet.    Metamucil/Citrucel supplements have been  recommended, and samples have been    given to the patient.  Fifteen grams of fiber per day have been    recommended.  _____ in the fiber has been recommended. 2. Colace 100-200 mg is to be used at bedtime as a stool softener. 3. Outpatient follow-up is advised in the next four weeks. DD:  03/02/00 TD:  03/03/00 Job: [id number]

## 2010-08-17 ENCOUNTER — Other Ambulatory Visit: Payer: Self-pay | Admitting: Cardiothoracic Surgery

## 2010-08-17 DIAGNOSIS — D381 Neoplasm of uncertain behavior of trachea, bronchus and lung: Secondary | ICD-10-CM

## 2010-08-17 DIAGNOSIS — R918 Other nonspecific abnormal finding of lung field: Secondary | ICD-10-CM

## 2010-08-25 DIAGNOSIS — Z0271 Encounter for disability determination: Secondary | ICD-10-CM

## 2010-10-26 ENCOUNTER — Other Ambulatory Visit: Payer: 59

## 2010-10-26 ENCOUNTER — Ambulatory Visit: Payer: 59 | Admitting: Cardiothoracic Surgery

## 2010-11-08 DIAGNOSIS — R918 Other nonspecific abnormal finding of lung field: Secondary | ICD-10-CM

## 2010-11-08 DIAGNOSIS — F1410 Cocaine abuse, uncomplicated: Secondary | ICD-10-CM | POA: Insufficient documentation

## 2010-11-08 DIAGNOSIS — J439 Emphysema, unspecified: Secondary | ICD-10-CM

## 2010-11-08 DIAGNOSIS — R911 Solitary pulmonary nodule: Secondary | ICD-10-CM | POA: Insufficient documentation

## 2010-11-08 DIAGNOSIS — K219 Gastro-esophageal reflux disease without esophagitis: Secondary | ICD-10-CM | POA: Insufficient documentation

## 2010-11-09 ENCOUNTER — Ambulatory Visit
Admission: RE | Admit: 2010-11-09 | Discharge: 2010-11-09 | Disposition: A | Discharge status: Home / Self Care | Payer: 59 | Source: Ambulatory Visit | Attending: Cardiothoracic Surgery | Admitting: Cardiothoracic Surgery

## 2010-11-09 ENCOUNTER — Ambulatory Visit (INDEPENDENT_AMBULATORY_CARE_PROVIDER_SITE_OTHER): Payer: 59 | Admitting: Cardiothoracic Surgery

## 2010-11-09 ENCOUNTER — Encounter: Payer: Self-pay | Admitting: Cardiothoracic Surgery

## 2010-11-09 VITALS — BP 125/93 | HR 92 | Resp 18 | Ht 73.0 in | Wt 192.0 lb

## 2010-11-09 DIAGNOSIS — J439 Emphysema, unspecified: Secondary | ICD-10-CM

## 2010-11-09 DIAGNOSIS — R918 Other nonspecific abnormal finding of lung field: Secondary | ICD-10-CM

## 2010-11-09 DIAGNOSIS — D491 Neoplasm of unspecified behavior of respiratory system: Secondary | ICD-10-CM

## 2010-11-09 DIAGNOSIS — D381 Neoplasm of uncertain behavior of trachea, bronchus and lung: Secondary | ICD-10-CM

## 2010-11-09 MED ORDER — IOHEXOL 300 MG/ML  SOLN
75.0000 mL | Freq: Once | INTRAMUSCULAR | Status: AC | PRN
  Administered 2010-11-09: 75 mL via INTRAVENOUS

## 2010-11-09 NOTE — Progress Notes (Signed)
HPI:  The patient is a 60 year old black male smoker with severe end-stage lung disease from bullous emphysema. He is initially evaluated in March of this year for a  pleural based density approximately 5 x 0.5 cm which was felt to be a benign fibrous tumor of the pleura based on a negative PET scan. The PET scan at that time however showed some mild sub centimeter mediastinal lymph nodes in the AP window mild metabolic activity. He had just recovered from a course of recurrent bronchitis  At that time. He returns now for a remote followup CT scan to evaluate the mediastinal lymph nodes.  The patient continues to smoke but the proximal half pack of cigarettes a day. He needs to have severe dyspnea on exertion productive morning cough decreased activity. He is in the process of applying for disability. Denies hemoptysis. He has lost 5 pounds in weight intentionally. He remains on multiple oral medications as well as inhalers for his COPD. He has not seen a pulmonologist.   Current Outpatient Prescriptions  Medication Sig Dispense Refill  . albuterol (PROVENTIL HFA;VENTOLIN HFA) 108 (90 BASE) MCG/ACT inhaler Inhale 2 puffs into the lungs every 6 (six) hours as needed.        . beclomethasone (QVAR) 80 MCG/ACT inhaler Inhale 1 puff into the lungs as needed.        . cyclobenzaprine (FLEXERIL) 10 MG tablet Take 10 mg by mouth 2 (two) times daily as needed.        . meloxicam (MOBIC) 7.5 MG tablet Take 7.5 mg by mouth daily. As needed       . methocarbamol (ROBAXIN) 750 MG tablet Take 750 mg by mouth 3 (three) times daily. As needed       . mometasone-formoterol (DULERA) 100-5 MCG/ACT AERO Inhale 2 puffs into the lungs 2 (two) times daily.        Marland Kitchen omeprazole (PRILOSEC) 40 MG capsule Take 40 mg by mouth daily.        . predniSONE (DELTASONE) 10 MG tablet Take 10 mg by mouth daily.         No current facility-administered medications for this visit.   Facility-Administered Medications Ordered in Other  Visits  Medication Dose Route Frequency Provider Last Rate Last Dose  . iohexol (OMNIPAQUE) 300 MG/ML injection 75 mL  75 mL Intravenous Once PRN Medication Radiologist   75 mL at 11/09/10 1036     Physical Exam:  Pressure 125/90 pulse 80 and regular respirations 20 saturation on room air 98% Middle-age black male with shortness of breath at rest. ENT exam is normocephalic pupils are normal. Neck is a lot adenopathy or mass or JVD. chest exam is with bilateral rales and wheezes. Cardiac exam is regular rhythm without murmur or gallop.  Diagnostic Tests: A CT scan of the chest with IV contrast is obtained. The pleural based mass is stable to slightly decreased in size. The AP window adenopathy appears to be diminished in size appeared to study in May 2012. Noted is a small 4 mm nodule in the right upper lung for which followup CT scan in one year is recommended. This will be planned according to guidelines.  Impression:  Severe COPD with bullous emphysema. Able right pleural-based massThis appears benign. A small 4 mm nodule in the right upper lobe is noted.  Plan: Followup CT scan of the chest in one year to followup the right upper lobe small nodule. Complete smoking cessation is also recommended.

## 2010-11-09 NOTE — Patient Instructions (Signed)
Stop smoking

## 2010-11-21 LAB — POCT I-STAT, CHEM 8
BUN: 17
Calcium, Ion: 1.19
Chloride: 107
Creatinine, Ser: 1.3
Glucose, Bld: 106 — ABNORMAL HIGH
HCT: 47
Hemoglobin: 16
Potassium: 4.1
Sodium: 138
TCO2: 26

## 2011-01-31 ENCOUNTER — Institutional Professional Consult (permissible substitution): Payer: 59 | Admitting: Critical Care Medicine

## 2011-03-06 ENCOUNTER — Encounter: Payer: Self-pay | Admitting: Critical Care Medicine

## 2011-03-07 ENCOUNTER — Encounter: Payer: Self-pay | Admitting: Critical Care Medicine

## 2011-03-07 ENCOUNTER — Ambulatory Visit (INDEPENDENT_AMBULATORY_CARE_PROVIDER_SITE_OTHER): Payer: 59 | Admitting: Critical Care Medicine

## 2011-03-07 DIAGNOSIS — F17200 Nicotine dependence, unspecified, uncomplicated: Secondary | ICD-10-CM

## 2011-03-07 DIAGNOSIS — J449 Chronic obstructive pulmonary disease, unspecified: Secondary | ICD-10-CM

## 2011-03-07 DIAGNOSIS — Z720 Tobacco use: Secondary | ICD-10-CM

## 2011-03-07 DIAGNOSIS — F1410 Cocaine abuse, uncomplicated: Secondary | ICD-10-CM

## 2011-03-07 DIAGNOSIS — R222 Localized swelling, mass and lump, trunk: Secondary | ICD-10-CM

## 2011-03-07 DIAGNOSIS — R918 Other nonspecific abnormal finding of lung field: Secondary | ICD-10-CM

## 2011-03-07 MED ORDER — TIOTROPIUM BROMIDE MONOHYDRATE 18 MCG IN CAPS: 18.0000 ug | ORAL_CAPSULE | Freq: Every day | RESPIRATORY_TRACT | Status: DC

## 2011-03-07 MED ORDER — OMEPRAZOLE 40 MG PO CPDR: 40.0000 mg | DELAYED_RELEASE_CAPSULE | Freq: Every day | ORAL | Status: DC

## 2011-03-07 MED ORDER — PREDNISONE 10 MG PO TABS: ORAL_TABLET | ORAL | Status: DC

## 2011-03-07 NOTE — Progress Notes (Signed)
Subjective:    [name], male    DOB: 09-Apr-1950, 61 y.o.   MRN: [id number]  HPI Comments: Dx 2012 Hosp for bronchitis.  Dx Copd.  VanTrigt saw the patient.  D/t RUL pleural lesion.  Bx not done. Hx heavy smoking and still doing so.,  Now on wellbutrin.  Was 10cig/d now down to 5/d.    Shortness of Breath This is a chronic problem. The current episode started more than 1 year ago. The problem occurs daily (Dyspnea at rest and exertion. Will awaken at night gasping for air). The problem has been rapidly worsening. Associated symptoms include abdominal pain, chest pain, headaches, PND, rhinorrhea, a sore throat, sputum production and wheezing. Pertinent negatives include no claudication, coryza, ear pain, fever, hemoptysis, leg pain, leg swelling, neck pain, orthopnea, rash or vomiting. The symptoms are aggravated by any activity, exercise, URIs, weather changes, odors, fumes, lying flat, emotional upset and pollens. Associated symptoms comments: Mucus is white to yellow/brown Chest pain with cough. Risk factors include smoking. He has tried beta agonist inhalers for the symptoms. The treatment provided mild relief. His past medical history is significant for chronic lung disease and COPD. There is no history of allergies, aspirin allergies, asthma, bronchiolitis, CAD, DVT, a heart failure, PE, pneumonia or a recent surgery.      Review of Systems  Constitutional: Positive for appetite change and unexpected weight change. Negative for fever.  HENT: Positive for congestion, sore throat, rhinorrhea, sneezing, postnasal drip and tinnitus. Negative for ear pain, trouble swallowing, neck pain and dental problem.   Eyes: Negative for redness.  Respiratory: Positive for cough, sputum production, chest tightness, shortness of breath and wheezing. Negative for hemoptysis.   Cardiovascular: Positive for chest pain and PND. Negative for palpitations, orthopnea, claudication and leg  swelling.  Gastrointestinal: Positive for abdominal pain. Negative for nausea, vomiting and diarrhea.  Genitourinary: Negative for dysuria and urgency.  Musculoskeletal: Negative for joint swelling.  Skin: Negative for rash.  Neurological: Positive for light-headedness and headaches. Negative for syncope.  Hematological: Does not bruise/bleed easily.  Psychiatric/Behavioral: Positive for dysphoric mood. The patient is nervous/anxious.        Objective:   Physical Exam   Filed Vitals:   03/07/11 1052  BP: 120/92  Pulse: 95  Temp: 97.2 F (36.2 C)  TempSrc: Oral  Height: 6\' 1"  (1.854 m)  Weight: 199 lb 12.8 oz (90.629 kg)  SpO2: 98%    Gen: Pleasant, well-nourished, in no distress,  normal affect  ENT: No lesions,  mouth clear,  oropharynx clear, no postnasal drip  Neck: No JVD, no TMG, no carotid bruits  Lungs: No use of accessory muscles, no dullness to percussion, distant BS poor airflow, exp wheeze  Cardiovascular: RRR, heart sounds normal, no murmur or gallops, no peripheral edema  Abdomen: soft and NT, no HSM,  BS normal  Musculoskeletal: No deformities, no cyanosis or clubbing  Neuro: alert, non focal  Skin: Warm, no lesions or rashes  9/12 Unchanged appearance of lobulated pleural mass along the peripheral  aspect of the right upper thorax, grossly unchanged in size,  measuring approximately 5.5 x 1.8 cm (axial image 21), previously,  6.4 x 1.4 cm. Redemonstrated extensive centrilobular and  paraseptal emphysema with bullous change predominately involving  the left lung apex. No definite pneumothorax. 4 mm indeterminate  nodule within the right upper lobe (axial image 22). Bibasilar  dependent ground-glass opacities, likely atelectasis. No new focal  airspace opacities. No pleural effusion.  Normal heart size. No pericardial effusion. The caliber of the  main pulmonary artery is normal. Normal configuration of the  aortic arch. Normal caliber of the  thoracic aorta. No  mediastinal, hilar or axillary lymphadenopathy.  Limited evaluation of the upper abdomen redemonstrates mild diffuse  thickening of the bilateral adrenal glands, left greater than  right, without discrete nodule.  No acute aggressive osseous abnormalities.  IMPRESSION:  1. Unchanged appearance of lobulated pleural mass along the  lateral aspect of the right upper thorax. The appearance remains  indeterminate, though stability and lack of FDG avidity suggests a  benign etiology such as fibrous tumor of pleura.  2. Indeterminate 4-mm nodule within the right upper lobe. A follow-  up chest CT at 1 year is recommended. This recommendation follows  the consensus statement: Guidelines for Management of Small  Pulmonary Nodules Detected on CT Scans: A Statement from the  [employer] as published in Radiology 2005; 237:395-400.  Available online at: MyDisguise.cz-  nodule.htm.       Assessment & Plan:   COPD (chronic obstructive pulmonary disease) Golds III Copd with Bullous emphysema, pt with a large LUL bullae localized.  ? If resection would improve function Also stable RUL pleural based lesion, doubt is CA with neg PET   Plan Prednisone 10mg  Take 4 for four days 3 for four days 2 for four days then one daily Stay on Dulera Stop Qvar Start Spiriva daily Resume omeprazole daily Check alpha one antitrypsin assay Reflux diet Return 1 month STOP SMOKING--add nicorette minis to the wellbutrin   Lung mass Rul pleural based mass is likely benign Plan Follow   Nicotine abuse Nicotine abuse Plan Nicorette gum wellbutrin Counseling provided   Will obtain PFTs later    Updated Medication List Outpatient Encounter Prescriptions as of 03/07/2011  Medication Sig Dispense Refill  . albuterol (ACCUNEB) 1.25 MG/3ML nebulizer solution Take 1 ampule by nebulization 2 (two) times daily as needed.       Marland Kitchen albuterol (PROVENTIL  HFA;VENTOLIN HFA) 108 (90 BASE) MCG/ACT inhaler Inhale 2 puffs into the lungs every 6 (six) hours as needed.        Marland Kitchen buPROPion (WELLBUTRIN XL) 150 MG 24 hr tablet Take 1 tablet by mouth daily.      . cyclobenzaprine (FLEXERIL) 10 MG tablet Take 10 mg by mouth 2 (two) times daily as needed.        Marland Kitchen ipratropium (ATROVENT) 0.02 % nebulizer solution Take 500 mcg by nebulization 2 (two) times daily.       . mometasone-formoterol (DULERA) 100-5 MCG/ACT AERO Inhale 2 puffs into the lungs 2 (two) times daily.        . predniSONE (DELTASONE) 10 MG tablet Take 4 for four days 3 for four days 2 for four days then one daily  60 tablet  6  . DISCONTD: beclomethasone (QVAR) 80 MCG/ACT inhaler Inhale 2 puffs into the lungs 2 (two) times daily.       Marland Kitchen DISCONTD: predniSONE (DELTASONE) 10 MG tablet Take 10 mg by mouth daily.        . benzonatate (TESSALON) 100 MG capsule as directed.      . meloxicam (MOBIC) 7.5 MG tablet Take 7.5 mg by mouth daily. As needed       . methocarbamol (ROBAXIN) 750 MG tablet Take 750 mg by mouth 3 (three) times daily. As needed       . omeprazole (PRILOSEC) 40 MG capsule Take 1 capsule (40 mg total) by  mouth daily.  60 capsule  6  . tiotropium (SPIRIVA HANDIHALER) 18 MCG inhalation capsule Place 1 capsule (18 mcg total) into inhaler and inhale daily.  30 capsule  2  . DISCONTD: omeprazole (PRILOSEC) 40 MG capsule Take 40 mg by mouth daily.

## 2011-03-07 NOTE — Patient Instructions (Addendum)
Chest xray today Prednisone 10mg  Take 4 for four days 3 for four days 2 for four days then one daily Labs today Stay on Dulera Stop Qvar Start Spiriva daily Resume omeprazole daily Reflux diet Return 1 month STOP SMOKING--add nicorette minis to the wellbutrin

## 2011-03-09 NOTE — Assessment & Plan Note (Addendum)
Golds III Copd with Bullous emphysema, pt with a large LUL bullae localized.  ? If resection would improve function Also stable RUL pleural based lesion, doubt is CA with neg PET   Plan Prednisone 10mg  Take 4 for four days 3 for four days 2 for four days then one daily Stay on Dulera Stop Qvar Start Spiriva daily Resume omeprazole daily Check alpha one antitrypsin assay Reflux diet Return 1 month STOP SMOKING--add nicorette minis to the wellbutrin

## 2011-03-09 NOTE — Assessment & Plan Note (Signed)
Rul pleural based mass is likely benign Plan Follow

## 2011-03-09 NOTE — Assessment & Plan Note (Signed)
Nicotine abuse Plan Nicorette gum wellbutrin Counseling provided

## 2011-03-21 ENCOUNTER — Telehealth: Payer: Self-pay | Admitting: *Deleted

## 2011-03-21 NOTE — Telephone Encounter (Signed)
[email or web address].Ewa Hipp, CMA  

## 2011-03-21 NOTE — Telephone Encounter (Signed)
Message copied by Lilli Few on Tue Mar 21, 2011  3:01 PM ------      Message from: Asencion Noble E      Created: Thu Mar 09, 2011 10:21 AM       Call this pt and tell him he needs to get his chest xray done, was not done on his OV            Also need to bring him in for an alpha one test            Use [facility] lab pouch            pw

## 2011-03-29 NOTE — Telephone Encounter (Signed)
LMTCBx2. Jennifer Castillo, CMA  

## 2011-04-03 NOTE — Telephone Encounter (Signed)
Called, spoke with pt's son.  Was advised pt is not in at this time but he will ask pt to call office back.

## 2011-04-06 NOTE — Telephone Encounter (Signed)
I called, spoke with pt.  I informed him PW would like him to come in to have a CXR done because it wasn't done at his OV.  I also advised PW recs we do an Alpha One test which tests for a generic cause of COPD.  Pt was not interested in coming in for either of these tests and stated he "was not happy with the diagnosis from Dr. Joya Gaskins and will not be returning to see him."  I did ask if he changed his mind to please call our office back.  Will forward message to Dr. Joya Gaskins so he is aware pt does not wish to come in for these tests.

## 2011-04-07 ENCOUNTER — Ambulatory Visit: Payer: 59 | Admitting: Critical Care Medicine

## 2011-04-07 NOTE — Telephone Encounter (Signed)
Noted.  I will forward to PCP so she is aware of this issue.

## 2011-04-20 ENCOUNTER — Ambulatory Visit
Admission: RE | Admit: 2011-04-20 | Discharge: 2011-04-20 | Disposition: A | Discharge status: Home / Self Care | Payer: 59 | Source: Ambulatory Visit | Attending: Nurse Practitioner | Admitting: Nurse Practitioner

## 2011-04-20 ENCOUNTER — Other Ambulatory Visit: Payer: Self-pay | Admitting: Nurse Practitioner

## 2011-04-20 DIAGNOSIS — J449 Chronic obstructive pulmonary disease, unspecified: Secondary | ICD-10-CM

## 2011-04-20 DIAGNOSIS — R062 Wheezing: Secondary | ICD-10-CM

## 2011-09-28 ENCOUNTER — Inpatient Hospital Stay (HOSPITAL_COMMUNITY)
Admission: EM | Admit: 2011-09-28 | Discharge: 2011-09-30 | DRG: 194 | Disposition: A | Discharge status: Home / Self Care | Payer: 59 | Attending: Family Medicine | Admitting: Family Medicine

## 2011-09-28 ENCOUNTER — Inpatient Hospital Stay (HOSPITAL_COMMUNITY): Payer: 59

## 2011-09-28 ENCOUNTER — Encounter (HOSPITAL_COMMUNITY): Payer: Self-pay | Admitting: Emergency Medicine

## 2011-09-28 ENCOUNTER — Emergency Department (HOSPITAL_COMMUNITY): Payer: 59

## 2011-09-28 DIAGNOSIS — M545 Low back pain, unspecified: Secondary | ICD-10-CM

## 2011-09-28 DIAGNOSIS — R911 Solitary pulmonary nodule: Secondary | ICD-10-CM | POA: Diagnosis present

## 2011-09-28 DIAGNOSIS — J441 Chronic obstructive pulmonary disease with (acute) exacerbation: Secondary | ICD-10-CM | POA: Diagnosis present

## 2011-09-28 DIAGNOSIS — E785 Hyperlipidemia, unspecified: Secondary | ICD-10-CM | POA: Diagnosis present

## 2011-09-28 DIAGNOSIS — Z79899 Other long term (current) drug therapy: Secondary | ICD-10-CM

## 2011-09-28 DIAGNOSIS — J180 Bronchopneumonia, unspecified organism: Principal | ICD-10-CM | POA: Diagnosis present

## 2011-09-28 DIAGNOSIS — F1410 Cocaine abuse, uncomplicated: Secondary | ICD-10-CM

## 2011-09-28 DIAGNOSIS — Z720 Tobacco use: Secondary | ICD-10-CM

## 2011-09-28 DIAGNOSIS — R222 Localized swelling, mass and lump, trunk: Secondary | ICD-10-CM | POA: Diagnosis present

## 2011-09-28 DIAGNOSIS — R918 Other nonspecific abnormal finding of lung field: Secondary | ICD-10-CM

## 2011-09-28 DIAGNOSIS — F17210 Nicotine dependence, cigarettes, uncomplicated: Secondary | ICD-10-CM | POA: Insufficient documentation

## 2011-09-28 DIAGNOSIS — J449 Chronic obstructive pulmonary disease, unspecified: Secondary | ICD-10-CM

## 2011-09-28 DIAGNOSIS — R079 Chest pain, unspecified: Secondary | ICD-10-CM

## 2011-09-28 DIAGNOSIS — J439 Emphysema, unspecified: Secondary | ICD-10-CM | POA: Diagnosis present

## 2011-09-28 DIAGNOSIS — F17200 Nicotine dependence, unspecified, uncomplicated: Secondary | ICD-10-CM | POA: Diagnosis present

## 2011-09-28 DIAGNOSIS — K219 Gastro-esophageal reflux disease without esophagitis: Secondary | ICD-10-CM | POA: Diagnosis present

## 2011-09-28 DIAGNOSIS — G8929 Other chronic pain: Secondary | ICD-10-CM | POA: Diagnosis present

## 2011-09-28 LAB — CARDIAC PANEL(CRET KIN+CKTOT+MB+TROPI)
CK, MB: 2.7 ng/mL (ref 0.3–4.0)
CK, MB: 2.6 ng/mL (ref 0.3–4.0)
Relative Index: 1.5 (ref 0.0–2.5)
Relative Index: 1.5 (ref 0.0–2.5)
Total CK: 175 U/L (ref 7–232)
Total CK: 169 U/L (ref 7–232)
Troponin I: <0.3 ng/mL
Troponin I: <0.3 ng/mL

## 2011-09-28 LAB — BASIC METABOLIC PANEL
CO2: 26 mEq/L (ref 19–32)
Chloride: 99 mEq/L (ref 96–112)
GFR calc Af Amer: 80 mL/min — ABNORMAL LOW (ref >90)
Potassium: 4 mEq/L (ref 3.5–5.1)

## 2011-09-28 LAB — URINALYSIS, ROUTINE W REFLEX MICROSCOPIC
Bilirubin Urine: NEGATIVE
Glucose, UA: 250 mg/dL — AB
Hgb urine dipstick: NEGATIVE
Ketones, ur: NEGATIVE mg/dL
Leukocytes, UA: NEGATIVE
Nitrite: NEGATIVE
Protein, ur: NEGATIVE mg/dL
Specific Gravity, Urine: <1.005 — ABNORMAL LOW (ref 1.005–1.030)
Urobilinogen, UA: 0.2 mg/dL (ref 0.0–1.0)
pH: 5.5 (ref 5.0–8.0)

## 2011-09-28 LAB — PRO B NATRIURETIC PEPTIDE: Pro B Natriuretic peptide (BNP): 8.5 pg/mL (ref 0–125)

## 2011-09-28 LAB — CBC WITH DIFFERENTIAL/PLATELET
Basophils Absolute: 0 10*3/uL (ref 0.0–0.1)
Basophils Relative: 0 % (ref 0–1)
HCT: 48.7 % (ref 39.0–52.0)
Hemoglobin: 17.1 g/dL — ABNORMAL HIGH (ref 13.0–17.0)
Lymphocytes Relative: 25 % (ref 12–46)
MCHC: 35.1 g/dL (ref 30.0–36.0)
Monocytes Relative: 6 % (ref 3–12)
Neutro Abs: 7.3 10*3/uL (ref 1.7–7.7)
Neutrophils Relative %: 66 % (ref 43–77)
RDW: 14.1 % (ref 11.5–15.5)
WBC: 11 10*3/uL — ABNORMAL HIGH (ref 4.0–10.5)

## 2011-09-28 LAB — CREATININE, SERUM
GFR calc Af Amer: 87 mL/min — ABNORMAL LOW (ref >90)
GFR calc non Af Amer: 75 mL/min — ABNORMAL LOW (ref >90)

## 2011-09-28 LAB — RAPID URINE DRUG SCREEN, HOSP PERFORMED: Benzodiazepines: NOT DETECTED

## 2011-09-28 LAB — CBC
HCT: 48 % (ref 39.0–52.0)
Hemoglobin: 16.5 g/dL (ref 13.0–17.0)
RBC: 5.49 MIL/uL (ref 4.22–5.81)
WBC: 13.2 10*3/uL — ABNORMAL HIGH (ref 4.0–10.5)

## 2011-09-28 LAB — POCT I-STAT TROPONIN I

## 2011-09-28 MED ORDER — ZOLPIDEM TARTRATE 5 MG PO TABS: 5.0000 mg | ORAL_TABLET | Freq: Every evening | ORAL | Status: DC | PRN

## 2011-09-28 MED ORDER — ASPIRIN EC 81 MG PO TBEC
81.0000 mg | DELAYED_RELEASE_TABLET | Freq: Every day | ORAL | Status: DC
  Administered 2011-09-28 – 2011-09-30 (×3): 81 mg via ORAL
  Filled 2011-09-28 (×3): qty 1

## 2011-09-28 MED ORDER — CEFTRIAXONE SODIUM 1 G IJ SOLR
1.0000 g | Freq: Once | INTRAMUSCULAR | Status: AC
  Administered 2011-09-28: 1 g via INTRAVENOUS
  Filled 2011-09-28: qty 10

## 2011-09-28 MED ORDER — IOHEXOL 350 MG/ML SOLN
100.0000 mL | Freq: Once | INTRAVENOUS | Status: AC | PRN
  Administered 2011-09-28: 100 mL via INTRAVENOUS

## 2011-09-28 MED ORDER — IPRATROPIUM BROMIDE 0.02 % IN SOLN
0.5000 mg | Freq: Four times a day (QID) | RESPIRATORY_TRACT | Status: DC
  Administered 2011-09-28: 0.5 mg via RESPIRATORY_TRACT
  Filled 2011-09-28: qty 2.5

## 2011-09-28 MED ORDER — METHOCARBAMOL 750 MG PO TABS
750.0000 mg | ORAL_TABLET | Freq: Three times a day (TID) | ORAL | Status: DC
  Administered 2011-09-28 – 2011-09-30 (×7): 750 mg via ORAL
  Filled 2011-09-28 (×10): qty 1

## 2011-09-28 MED ORDER — ONDANSETRON HCL 4 MG/2ML IJ SOLN: 4.0000 mg | Freq: Four times a day (QID) | INTRAMUSCULAR | Status: DC | PRN

## 2011-09-28 MED ORDER — ALBUTEROL SULFATE (5 MG/ML) 0.5% IN NEBU
2.5000 mg | INHALATION_SOLUTION | RESPIRATORY_TRACT | Status: DC
  Administered 2011-09-28 – 2011-09-29 (×4): 2.5 mg via RESPIRATORY_TRACT
  Filled 2011-09-28 (×5): qty 0.5

## 2011-09-28 MED ORDER — DOCUSATE SODIUM 100 MG PO CAPS
100.0000 mg | ORAL_CAPSULE | Freq: Two times a day (BID) | ORAL | Status: DC
  Administered 2011-09-28 – 2011-09-30 (×5): 100 mg via ORAL
  Filled 2011-09-28 (×6): qty 1

## 2011-09-28 MED ORDER — IPRATROPIUM BROMIDE 0.02 % IN SOLN
0.5000 mg | Freq: Once | RESPIRATORY_TRACT | Status: AC
  Administered 2011-09-28: 0.5 mg via RESPIRATORY_TRACT
  Filled 2011-09-28: qty 2.5

## 2011-09-28 MED ORDER — DEXTROSE 5 % IV SOLN
500.0000 mg | Freq: Once | INTRAVENOUS | Status: AC
  Administered 2011-09-28: 500 mg via INTRAVENOUS
  Filled 2011-09-28: qty 500

## 2011-09-28 MED ORDER — ACETAMINOPHEN 650 MG RE SUPP: 650.0000 mg | Freq: Four times a day (QID) | RECTAL | Status: DC | PRN

## 2011-09-28 MED ORDER — HYDROCODONE-ACETAMINOPHEN 5-325 MG PO TABS
1.0000 | ORAL_TABLET | ORAL | Status: DC | PRN
  Administered 2011-09-28: 2 via ORAL
  Filled 2011-09-28: qty 2

## 2011-09-28 MED ORDER — TIOTROPIUM BROMIDE MONOHYDRATE 18 MCG IN CAPS
18.0000 ug | ORAL_CAPSULE | Freq: Every day | RESPIRATORY_TRACT | Status: DC
  Administered 2011-09-29 – 2011-09-30 (×2): 18 ug via RESPIRATORY_TRACT
  Filled 2011-09-28: qty 5

## 2011-09-28 MED ORDER — SODIUM CHLORIDE 0.9 % IV SOLN
INTRAVENOUS | Status: AC
  Administered 2011-09-28: 09:00:00 via INTRAVENOUS

## 2011-09-28 MED ORDER — SODIUM CHLORIDE 0.9 % IV SOLN
INTRAVENOUS | Status: DC
  Administered 2011-09-28: 11:00:00 via INTRAVENOUS

## 2011-09-28 MED ORDER — IPRATROPIUM BROMIDE 0.02 % IN SOLN: 0.5000 mg | Freq: Four times a day (QID) | RESPIRATORY_TRACT | Status: DC

## 2011-09-28 MED ORDER — ALBUTEROL SULFATE (5 MG/ML) 0.5% IN NEBU: 2.5000 mg | INHALATION_SOLUTION | Freq: Four times a day (QID) | RESPIRATORY_TRACT | Status: DC

## 2011-09-28 MED ORDER — GUAIFENESIN-DM 100-10 MG/5ML PO SYRP: 5.0000 mL | ORAL_SOLUTION | ORAL | Status: DC | PRN

## 2011-09-28 MED ORDER — MORPHINE SULFATE 4 MG/ML IJ SOLN
4.0000 mg | Freq: Once | INTRAMUSCULAR | Status: AC
  Administered 2011-09-28: 4 mg via INTRAVENOUS
  Filled 2011-09-28: qty 1

## 2011-09-28 MED ORDER — ONDANSETRON HCL 4 MG/2ML IJ SOLN
4.0000 mg | Freq: Once | INTRAMUSCULAR | Status: AC
  Administered 2011-09-28: 4 mg via INTRAVENOUS
  Filled 2011-09-28: qty 2

## 2011-09-28 MED ORDER — BUPROPION HCL ER (XL) 150 MG PO TB24
150.0000 mg | ORAL_TABLET | Freq: Every day | ORAL | Status: DC
  Administered 2011-09-28 – 2011-09-30 (×3): 150 mg via ORAL
  Filled 2011-09-28 (×3): qty 1

## 2011-09-28 MED ORDER — SODIUM CHLORIDE 0.9 % IJ SOLN
3.0000 mL | Freq: Two times a day (BID) | INTRAMUSCULAR | Status: DC
  Administered 2011-09-29: 3 mL via INTRAVENOUS

## 2011-09-28 MED ORDER — ACETAMINOPHEN 325 MG PO TABS: 650.0000 mg | ORAL_TABLET | Freq: Four times a day (QID) | ORAL | Status: DC | PRN

## 2011-09-28 MED ORDER — MOMETASONE FURO-FORMOTEROL FUM 100-5 MCG/ACT IN AERO
2.0000 | INHALATION_SPRAY | Freq: Two times a day (BID) | RESPIRATORY_TRACT | Status: DC
  Administered 2011-09-29 (×2): 2 via RESPIRATORY_TRACT

## 2011-09-28 MED ORDER — METHYLPREDNISOLONE SODIUM SUCC 125 MG IJ SOLR
60.0000 mg | Freq: Two times a day (BID) | INTRAMUSCULAR | Status: DC
  Administered 2011-09-28: 60 mg via INTRAVENOUS
  Administered 2011-09-28: 62.5 mg via INTRAVENOUS
  Administered 2011-09-29 (×2): 60 mg via INTRAVENOUS
  Filled 2011-09-28 (×2): qty 0.96
  Filled 2011-09-28 (×2): qty 2
  Filled 2011-09-28: qty 0.96
  Filled 2011-09-28: qty 2
  Filled 2011-09-28: qty 0.96

## 2011-09-28 MED ORDER — MOXIFLOXACIN HCL IN NACL 400 MG/250ML IV SOLN
400.0000 mg | INTRAVENOUS | Status: DC
  Administered 2011-09-28 – 2011-09-29 (×2): 400 mg via INTRAVENOUS
  Filled 2011-09-28 (×3): qty 250

## 2011-09-28 MED ORDER — ENOXAPARIN SODIUM 40 MG/0.4ML ~~LOC~~ SOLN
40.0000 mg | SUBCUTANEOUS | Status: DC
  Filled 2011-09-28 (×3): qty 0.4

## 2011-09-28 MED ORDER — CYCLOBENZAPRINE HCL 10 MG PO TABS
10.0000 mg | ORAL_TABLET | Freq: Once | ORAL | Status: AC
  Administered 2011-09-28: 10 mg via ORAL
  Filled 2011-09-28: qty 1

## 2011-09-28 MED ORDER — ATORVASTATIN CALCIUM 40 MG PO TABS
40.0000 mg | ORAL_TABLET | Freq: Every day | ORAL | Status: DC
  Administered 2011-09-28 – 2011-09-30 (×3): 40 mg via ORAL
  Filled 2011-09-28 (×3): qty 1

## 2011-09-28 MED ORDER — ASPIRIN 81 MG PO CHEW
324.0000 mg | CHEWABLE_TABLET | Freq: Once | ORAL | Status: AC
  Administered 2011-09-28: 324 mg via ORAL
  Filled 2011-09-28: qty 4

## 2011-09-28 MED ORDER — ALBUTEROL SULFATE (5 MG/ML) 0.5% IN NEBU
5.0000 mg | INHALATION_SOLUTION | Freq: Once | RESPIRATORY_TRACT | Status: AC
  Administered 2011-09-28: 5 mg via RESPIRATORY_TRACT
  Filled 2011-09-28: qty 1

## 2011-09-28 MED ORDER — ONDANSETRON HCL 4 MG PO TABS: 4.0000 mg | ORAL_TABLET | Freq: Four times a day (QID) | ORAL | Status: DC | PRN

## 2011-09-28 MED ORDER — ALUM & MAG HYDROXIDE-SIMETH 200-200-20 MG/5ML PO SUSP: 30.0000 mL | Freq: Four times a day (QID) | ORAL | Status: DC | PRN

## 2011-09-28 MED ORDER — ALBUTEROL SULFATE (5 MG/ML) 0.5% IN NEBU
2.5000 mg | INHALATION_SOLUTION | Freq: Four times a day (QID) | RESPIRATORY_TRACT | Status: DC
  Administered 2011-09-28: 2.5 mg via RESPIRATORY_TRACT
  Filled 2011-09-28: qty 0.5

## 2011-09-28 MED ORDER — IPRATROPIUM BROMIDE 0.02 % IN SOLN
0.5000 mg | RESPIRATORY_TRACT | Status: DC
  Administered 2011-09-28 – 2011-09-29 (×4): 0.5 mg via RESPIRATORY_TRACT
  Filled 2011-09-28 (×5): qty 2.5

## 2011-09-28 MED ORDER — NICOTINE 21 MG/24HR TD PT24
21.0000 mg | MEDICATED_PATCH | Freq: Every day | TRANSDERMAL | Status: DC
  Filled 2011-09-28 (×3): qty 1

## 2011-09-28 NOTE — Evaluation (Signed)
Physical Therapy Evaluation Patient Details Name: Peter Sims MRN: [id number] DOB: [birth year] Today's Date: 09/28/2011 Time: [id number] PT Time Calculation (min): 18 min  PT Assessment / Plan / Recommendation Clinical Impression  Pt admitted with chest pain and SOB, possible pneumonia. Pt is at his baseline functional level, although decreased endurance and shortness of breath. Encouraged pt to use incentive spirometer as well as educated pt on breathing techniques and energy conservation. Spoke to MD regarding possible pulmonary rehab. No acute PT needs, will not follow.    PT Assessment  Patent does not need any further PT services                   Precautions / Restrictions Restrictions Weight Bearing Restrictions: No         Mobility  Bed Mobility Bed Mobility: Supine to Sit;Sitting - Scoot to Edge of Bed;Sit to Supine Supine to Sit: 7: Independent Sitting - Scoot to Edge of Bed: 7: Independent Sit to Supine: 7: Independent Transfers Transfers: Sit to Stand;Stand to Sit Sit to Stand: 7: Independent Stand to Sit: 7: Independent Ambulation/Gait Ambulation/Gait Assistance: 6: Modified independent (Device/Increase time) Ambulation Distance (Feet): 100 Feet Assistive device: None Ambulation/Gait Assistance Details: 1 rest break requiring during ambulation secondary to fatigue. Educated pt on breathing techniques as well as energy conservation Gait Pattern: Within Functional Limits     Visit Information  Last PT Received On: 09/28/11 Assistance Needed: +1    Subjective Data      Prior [place] Lives With: Spouse Available Help at Discharge: Family (works during the day) Type of Home: House Home Access: Stairs to enter Technical brewer of Steps: 3 Entrance Stairs-Rails: None Home Layout: One level Bathroom Shower/Tub: Product/process development scientist: [place] Accessibility: Yes How Accessible: Accessible via  [place]: Crutches Prior Function Level of Independence: Log Cabin to [place]?: Yes Driving: Yes Vocation: On disability Comments: cut the grass; neighborhood Museum/gallery conservator Communication: No difficulties Dominant Hand: Right    Cognition  Overall Cognitive Status: Appears within functional limits for tasks assessed/performed Arousal/Alertness: Awake/alert Orientation Level: Appears intact for tasks assessed Behavior During Session: [facility] for tasks performed    Extremity/Trunk Assessment Right Lower Extremity Assessment RLE ROM/Strength/Tone: Within functional levels RLE Sensation: WFL - Light Touch Left Lower Extremity Assessment LLE ROM/Strength/Tone: Within functional levels LLE Sensation: WFL - Light Touch   Balance    End of Session PT - End of Session Equipment Utilized During Treatment: Gait belt Activity Tolerance: Patient limited by fatigue Patient left: in bed;with call bell/phone within reach;with family/visitor present Nurse Communication: Mobility status   Ambrose Finland 09/28/2011, 5:31 PM  09/28/2011 Ambrose Finland DPT PAGER: [phone] OFFICE: [phone]

## 2011-09-28 NOTE — ED Notes (Signed)
Pt reports at 0400 with left sided chest pain radiating into left side of neck. Symptoms accompanied by shortness of breath and diaphoresis. Pt talking in complete sentences without difficulty.

## 2011-09-28 NOTE — Care Management Note (Unsigned)
    Page 1 of 1   09/28/2011     4:08:06 PM   CARE MANAGEMENT NOTE 09/28/2011  Patient:  Peter Sims, Peter Sims   Account Number:  [id number]  Date Initiated:  09/28/2011  Documentation initiated by:  GRAVES-BIGELOW,Berniece Abid  Subjective/Objective Assessment:   Pt admitted with sob and cp. Treating for worrisome bronchopneumonia with IV ABX, Iv solumedrol and Nebulizer treatments. Pt has family support.     Action/Plan:   CM will continue to monitor for disposition needs. Pt has dme home 02 and cpap.   Anticipated DC Date:  10/02/2011   Anticipated DC Plan:  [place]         Choice offered to / List presented to:             Status of service:  In process, will continue to follow Medicare Important Message given?   (If response is "NO", the following Medicare IM given date fields will be blank) Date Medicare IM given:   Date Additional Medicare IM given:    Discharge Disposition:    Per UR Regulation:  Reviewed for med. necessity/level of care/duration of stay  If discussed at [place] of Stay Meetings, dates discussed:    Comments:

## 2011-09-28 NOTE — ED Provider Notes (Signed)
7:54 AM  Date: 09/28/2011  Rate: 108  Rhythm: sinus tachycardia  QRS Axis: normal  Intervals: normal  ST/T Wave abnormalities: normal  Conduction Disutrbances:none  Narrative Interpretation: Sinus Tachycardia, otherwise normal.  Old EKG Reviewed: unchanged    Mylinda Latina III, MD 09/28/11 [phone]

## 2011-09-28 NOTE — H&P (Signed)
[facility] History and Physical  FLORA SWARTOUT [id number] DOB: 10/02/1950 DOA: 09/28/2011  Referring physician: Delos Haring / Dr. Alcide Goodness PCP: Salena Saner., MD  Pulmonologist:  Dr. Kary Kos of [facility]  Chief Complaint: Chest Pain with Shortness of breath  HPI:  This is a very pleasant 61 year old African American male with a history of bullous emphysema, lung mass, COPD, chronic low back pain, tobacco abuse. He presents to the ED this morning after awakening at 4 AM with severe chest pain and shortness of breath. The chest pain is in his left upper chest it is associated with back spasms, and becomes much worse when taking a deep breath. His chest pain is reproducible on palpation. With regards to his breathing he tells me that over the last 6 months he's noticed a decrease in his ability to walk the one block distance to his daughter's house. At home he is on 2 L of oxygen when needed during the day and CPAP at night.  This morning when he awoke with chest pain he was also very short of breath. At this point in the ED he is conversant but showing increased work of breathing when he speaks. On 2 L his oxygen saturations are 94%. His lung mass was discovered one year ago. His pulmonologist wanted to do a biopsy, but the patient says there was concern that his lung would be punctured and so no biopsy was done. The patient does not know if the masses malignant or benign. He continues to smoke approximately 5 cigarettes daily.  Review of Systems:  Pertinent positives include:  Back spasms,  difficulty with urination (which is worse when he takes Spiriva), left upper chest chest pain, shortness of breath, dyspnea on exertion, ongoing tobacco abuse.  Pertinent negatives include:  No history of cardiac problems, no hematemesis, no recent illness, no vomiting or fever, no changes in bowel habits.  All other systems reviewed and found to be negative.   Past Medical History    Diagnosis Date  . Cocaine abuse  none since 2002   . GERD (gastroesophageal reflux disease)   . Bullous emphysema     Severe Bilateral  . Lung mass     Right Upper Lobe pleural based mass, negative  on PET Scan  . COPD (chronic obstructive pulmonary disease)   . Lumbago   . Tobacco abuse   . Therapeutic drug monitoring   . Colitis     with bleeding  . LBP (low back pain)     now chronic  . Neck pain     nerve pain   . Spine pain     nerve   History reviewed. No pertinent past surgical history. Social History:  reports that he has been smoking Cigarettes.  He has never used smokeless tobacco. He reports that he does not drink alcohol or use illicit drugs.  Allergies  Allergen Reactions  . Penicillins Anaphylaxis    And hives     Family History:  The patient was adopted. Family history unknown   Problem Relation Age of Onset    Father     Sister     Mother      Prior to Admission medications   Medication Sig Start Date End Date Taking? Authorizing Provider  albuterol (ACCUNEB) 1.25 MG/3ML nebulizer solution Take 1 ampule by nebulization 2 (two) times daily as needed. Wheezing or shortness of breath   Yes Historical Provider, MD  albuterol (PROVENTIL HFA;VENTOLIN HFA) 108 (90 BASE) MCG/ACT inhaler  Inhale 2 puffs into the lungs every 6 (six) hours as needed. Shortness of breath   Yes Historical Provider, MD  atorvastatin (LIPITOR) 40 MG tablet Take 40 mg by mouth daily.   Yes Historical Provider, MD  buPROPion (WELLBUTRIN XL) 150 MG 24 hr tablet Take 1 tablet by mouth daily. 03/02/11  Yes Historical Provider, MD  cyclobenzaprine (FLEXERIL) 10 MG tablet Take 10 mg by mouth 2 (two) times daily as needed. Muscle spasms   Yes Historical Provider, MD  ipratropium (ATROVENT) 0.02 % nebulizer solution Take 500 mcg by nebulization 2 (two) times daily. Wheezing or shortness of breath   Yes Historical Provider, MD  methocarbamol (ROBAXIN) 750 MG tablet Take 750 mg by mouth 3 (three)  times daily. Muscle spasms   Yes Historical Provider, MD  mometasone-formoterol (DULERA) 100-5 MCG/ACT AERO Inhale 2 puffs into the lungs 2 (two) times daily.     Yes Historical Provider, MD  omeprazole (PRILOSEC) 40 MG capsule Take 1 capsule (40 mg total) by mouth daily. 03/07/11  Yes Elsie Stain, MD  predniSONE (DELTASONE) 10 MG tablet Take 4 for four days 3 for four days 2 for four days then one daily 03/07/11  Yes Elsie Stain, MD  tiotropium (SPIRIVA HANDIHALER) 18 MCG inhalation capsule Place 1 capsule (18 mcg total) into inhaler and inhale daily. 03/07/11 03/06/12 Yes Elsie Stain, MD   Physical Exam: Filed Vitals:   09/28/11 [id number] 09/28/11 0811  BP: 135/98   Pulse: 102   Temp: 98.4 F (36.9 C)   TempSrc: Oral   Resp: 18   SpO2: 94% 100%     General:  No Acute Distress, Awake, Non toxic  Eyes: Pupils equal, round and reactive to light, sclera hyperemic L>R  ENT: Oropharynx shows no edema or exudates, nose with out discharge  Neck: Supple  Cardiovascular:  Tachycardic, Regular rhythm, no murmurs, rubs or gallops, no lower extremity edema  Respiratory: Severe wheezes in all lung areas , increased work of breathing noted particularly when the patient is speaking.   Abdomen: Soft, non tender, non distended, positive bowel sounds, no obvious masses palpated.  Skin: no rashes, bruises, or lesions  Psychiatric: Alert and Orientated, Cooperative, Well groomed  Neurologic: Cranial Nerves 2-12 are grossly in tact.  Exam non focal.  Labs on Admission:  Basic Metabolic Panel:  Lab [id number] 0752  NA 137  K 4.0  CL 99  CO2 26  GLUCOSE 105*  BUN 16  CREATININE 1.13  CALCIUM 9.9  MG --  PHOS --   CBC:  Lab 09/28/11 0752  WBC 11.0*  NEUTROABS 7.3  HGB 17.1*  HCT 48.7  MCV 87.3  PLT 204    Radiological Exams on Admission: Dg Chest Portable 1 View  09/28/2011  *RADIOLOGY REPORT*  Clinical Data: Chest pain shortness of breath with cough and congestion   PORTABLE CHEST - 1 VIEW  Comparison: April 20, 2011  Findings: Prominent bullous emphysematous changes are noted within the left upper lung.  There are increased peribronchial markings within the left lower lung today, and to a lesser extent the right infrahilar region, worrisome for developing bronchopneumonia.  No pleural effusions are identified.  The cardiac silhouette, mediastinum, pulmonary vasculature are within normal limits.  IMPRESSION: Increased lower lobe peribronchial markings, left greater than right, worrisome for early bronchopneumonia.  Original Report Authenticated By: Duayne Cal, M.D.    EKG: Independently reviewed. Sinus Tach  Assessment/Plan Active Problems:  Bronchopneumonia  Bullous emphysema  Lung mass  COPD (chronic obstructive pulmonary disease)  GERD (gastroesophageal reflux disease)  Nicotine abuse  Lumbago   Short of Breath with Chest Pain. Admit to a telemetry bed with continuous pulse ox.   Will order CT Angio Chest to rule out pulmonary embolus, and more closely evaluate his lung mass. Will start Avelox rather than Rocephin given his allergies. Will start IV Solu-Medrol and scheduled nebulizers.  He will be on oxygen therapy titrated to 88-92%. We will offer nightly oxygen by [place]. Pt says he is not using CPAP and does not want to use it in the hospital.   Have requested smoking cessation consult and ordered for nicotine patch.  Bronchopneumonia. Have initiated antibiotics, (AVELOX IV),  steroids, and nebulizers. Resuming home pulmonary meds.    Bolus emphysema with lung mass. The patient sees Dr. Kary Kos, pulmonologist with [facility].  Per the records he had a negative PET scan, but I am very concerned for malignancy.  The patient does not seem to understand the severity of his chronic lung disease. Will consider pulmonary consult.    COPD. Please see above.  Lumbago with back spasms. Will continue Robaxin, and add when necessary narcotic pain medicine.  Will request physical therapy evaluation. K pad.  History of Cocaine abuse.  Will check Urine Drug Screen  Code Status: Full Code Family Communication: Wife works in [employer].  She was at bedside during our conversation. Disposition Plan: to home when medically appropriate.  Imogene Burn, PA-C [facility] Pager: [phone]   Pt was seen, interviewed and examined.  I agree with above H&P, Assessment and Plan.  Care plan discussed with patient, wife and daughter.   Murvin Natal, MD [facility] Pager [phone]   If 7PM-7AM, please contact night-coverage [email or web address] Password [email or web address] 09/28/2011, 9:53 AM

## 2011-09-28 NOTE — Progress Notes (Signed)
8:12 AM Pt had onset of chest pain around 4 A.M. Today.  He says, "My heart hurts."  The pain is in the left anterior and posterior chest.  He has known bullous emphysema.  Exam shows him to be in moderate distress with chest pain.  He has coarse rhonchi over both lung fields.  Heart sounds normal.  EKG showed sinus tach.  Lab workup has been ordered.  Nebulizer Rx, IV pain meds ordered.  Lab workup subsequently showed a LLL pneumonia.  Admitted for IV antibiotics for CAP.

## 2011-09-28 NOTE — Progress Notes (Signed)
UR Completed Zyanya Glaza Graves-Bigelow, RN,BSN [phone]  

## 2011-09-28 NOTE — Consult Note (Signed)
Name: Peter Sims MRN: [id number] DOB: [birth year]    LOS: 0  Referring Provider:  [facility] Reason for Referral:  Dyspnea / Lung mass  PULMONARY / CRITICAL CARE MEDICINE  HPI:  61 yo active smoker with COPD on nocturnal oxygen admitted for progressive dyspnea and chest pain.  Chest pain onset was sudden, on the morning of admission.  It was sharp, located in the left chest and aggravated by deep breath.  This morning he also had some wheezing and difficulty clearing secretions.  Dyspnea seem to progress since about six month ago and is associated with activity.  There is cough, productive of yellow sputum in the morning, and dry the rest of the day.  He denies fever or chills.  There is no leg swelling.  Patient reports increase in frequency of rescue inhaler use.  He continues to smoke about 5 cigarettes a day.  Past Medical History  Diagnosis Date  . Cocaine abuse   . GERD (gastroesophageal reflux disease)   . Bullous emphysema     Severe Bilateral  . Lung mass     Right Upper Lobe pleural based mass, negative  on PET Scan  . COPD (chronic obstructive pulmonary disease)   . Lumbago   . Tobacco abuse   . Therapeutic drug monitoring   . Colitis     with bleeding  . LBP (low back pain)     now chronic  . Neck pain     nerve pain   . Spine pain     nerve   History reviewed. No pertinent past surgical history. Prior to Admission medications   Medication Sig Start Date End Date Taking? Authorizing Provider  albuterol (ACCUNEB) 1.25 MG/3ML nebulizer solution Take 1 ampule by nebulization 2 (two) times daily as needed. Wheezing or shortness of breath   Yes Historical Provider, MD  albuterol (PROVENTIL HFA;VENTOLIN HFA) 108 (90 BASE) MCG/ACT inhaler Inhale 2 puffs into the lungs every 6 (six) hours as needed. Shortness of breath   Yes Historical Provider, MD  atorvastatin (LIPITOR) 40 MG tablet Take 40 mg by mouth daily.   Yes Historical Provider, MD  buPROPion (WELLBUTRIN XL) 150 MG  24 hr tablet Take 1 tablet by mouth daily. 03/02/11  Yes Historical Provider, MD  cyclobenzaprine (FLEXERIL) 10 MG tablet Take 10 mg by mouth 2 (two) times daily as needed. Muscle spasms   Yes Historical Provider, MD  ipratropium (ATROVENT) 0.02 % nebulizer solution Take 500 mcg by nebulization 2 (two) times daily. Wheezing or shortness of breath   Yes Historical Provider, MD  methocarbamol (ROBAXIN) 750 MG tablet Take 750 mg by mouth 3 (three) times daily. Muscle spasms   Yes Historical Provider, MD  mometasone-formoterol (DULERA) 100-5 MCG/ACT AERO Inhale 2 puffs into the lungs 2 (two) times daily.     Yes Historical Provider, MD  omeprazole (PRILOSEC) 40 MG capsule Take 1 capsule (40 mg total) by mouth daily. 03/07/11  Yes Elsie Stain, MD  predniSONE (DELTASONE) 10 MG tablet Take 4 for four days 3 for four days 2 for four days then one daily 03/07/11  Yes Elsie Stain, MD  tiotropium (SPIRIVA HANDIHALER) 18 MCG inhalation capsule Place 1 capsule (18 mcg total) into inhaler and inhale daily. 03/07/11 03/06/12 Yes Elsie Stain, MD   Allergies Allergies  Allergen Reactions  . Penicillins Anaphylaxis    And hives    Family History Family History  Problem Relation Age of Onset  . Cancer Father   .  Cervical cancer Sister   . Emphysema Mother    Social History  reports that he has been smoking Cigarettes.  He has never used smokeless tobacco. He reports that he does not drink alcohol or use illicit drugs.  Review Of Systems:  As in HPI.  In addition leg and back spasms.  Otherwise negative.  Vital Signs: Temp:  [97.8 F (36.6 C)-98.4 F (36.9 C)] 97.8 F (36.6 C) (08/01 1400) Pulse Rate:  [92-102] 99  (08/01 1400) Resp:  [18-20] 18  (08/01 1400) BP: (131-135)/(89-98) 131/89 mmHg (08/01 1400) SpO2:  [94 %-100 %] 100 % (08/01 1712) FiO2 (%):  [100 %] 100 % (08/01 1114)  Physical Examination: General:  No distress, resting comfortably Neuro:  Awake, alert, cooperative HEENT:   PERRL Neck:  Supple, no JVD Cardiovascular:  RRR, no murmurs Lungs:  Bilateral diminished air entry, scattered rhonchi, expiratory wheezes, prolonged expiratory phase Abdomen:  Soft, nontender, bowel sounds present Musculoskeletal:  No pedal edema Skin:  Intact  Labs:  Lab 09/28/11 1147 09/28/11 0752  HGB 16.5 17.1*  HCT 48.0 48.7  WBC 13.2* 11.0*  PLT 198 204  NA -- 137  K -- 4.0  CL -- 99  CO2 -- 26  GLUCOSE -- 105*  BUN -- 16  CREATININE 1.06 1.13  CALCIUM -- 9.9  MG -- --  PHOS -- --  AST -- --  ALT -- --  ALKPHOS -- --  BILITOT -- --  PROT -- --  ALBUMIN -- --  INR -- --  APTT -- --  INR -- --  LATICACIDVEN -- --  TROPONINI <0.30 --   No results found for this basename: PHART:5,PCO2:5,PCO2ART:5,PO2:5,PO2ART:5,HCO3:5,TCO2:5,O2SAT:5 in the last 168 hours   Lab 09/28/11 1902 09/28/11 1147  TROPONINI <0.30 <0.30   Imaging:  Chest CT 09/28/11 >>> New peribronchial interstitial thickening in the left hilar region, worrisome for bronchopneumonia. Slightly enlarged lymph nodes in the mediastinum and right hilar region are likely reactive. Stable, pronounced emphysematous changes. No pneumothorax. Stable loculated fluid/soft tissue lesion at the lateral right lung apex (5 cm in greatest dimension).  PET / CT 06/14/10 >>> No malignant range F D G uptake involving the pleural based right upper lobe lesion.  Chest CT 05/20/2010  >>> Poor to moderate quality evaluation for pulmonary embolism secondary to motion artifact. No large embolism identified. The plain film abnormality corresponds to a pleural-based right upper lobe mass 6.5 cm in greatest dimension). Differential considerations include primary bronchogenic carcinoma versus less likely a complex loculated pleural fluid or a fibrous tumor of the pleura. No thoracic adenopathy.  Severe centrilobular emphysema with bullous disease at the left apex.  ASSESSMENT AND PLAN:  CAP Hilar / mediastinal lymphadenopathy, likely  reactive COPD, oxygen-dependent (GOLD stage IV) with significant bullous disease, acute exacerbation Suspected Cor pulmonale Suspected OSA with nocturnal hypoxemia, on oxygen only, not on CPAP Pleura-based mass on the right present since 05/20/2010, stable (in fact smaller in size on recent imaging) with no evidence of increased metabolic activity on PET CT 06/14/10 - likely loculate pleyral effusion or benign tumor (reportedly seen by Dr. Prescott Gum in the past, who recommended conservative management) Active smoking  -->  Agree with Avelox for CAP -->  Chest CT after discharge to demonstrated resolution of lymphadenopathy -->  Agree with Solu-Medrol, Albuterol, Atrovent, supplemental oxygen for COPD -->  2D ECHO to r/o Cor pulmonale -->  Given risk benefit ratio of pleural mass biopsy, its stability and no metabolic activity would advocate  for conservative follow up.  Would think this decision needs to be made between the patient and his primary pulmonologist and does not have to be addressed during acute admission -->  Agree with Nicoderm /  Wellbutrin for smoking cessation  Doree Fudge, M.D. [facility] [place] Pager: [phone]  09/28/2011, 7:32 PM

## 2011-09-28 NOTE — ED Provider Notes (Signed)
History     CSN: [id number]  Arrival date & time 09/28/11  [id number]   First MD Initiated Contact with Patient 09/28/11 [phone]      Chief Complaint  Patient presents with  . Shortness of Breath  . Chest Pain    (Consider location/radiation/quality/duration/timing/severity/associated sxs/prior treatment) HPI  Pt brought to the ER with acute onset of left sided chest pain at 4am. The pain is worse with coughing and palpation. The patient is having severe shortness of breath and says that he having to work hard to breath. He uses oxygen at home and many medications but it has not helped. Pt has a significant past medical history for bullous emphysema and lung mass. He takes many medications on a daily basis for his COPD. He denies having pain like this in the past. He also denies having this severe shortness of breath. During interview the patients wheezes and crackle are auditory. The patient is tachycardic and tachyonic, Blood pressure and oxygen saturation are stable.  Past Medical History  Diagnosis Date  . Cocaine abuse   . GERD (gastroesophageal reflux disease)   . Bullous emphysema     Severe Bilateral  . Lung mass     Right Upper Lobe pleural based mass, negative  on PET Scan  . COPD (chronic obstructive pulmonary disease)   . Lumbago   . Tobacco abuse   . Therapeutic drug monitoring   . Colitis     with bleeding  . LBP (low back pain)     now chronic  . Neck pain     nerve pain   . Spine pain     nerve    History reviewed. No pertinent past surgical history.  Family History  Problem Relation Age of Onset  . Cancer Father   . Cervical cancer Sister   . Emphysema Mother     History  Substance Use Topics  . Smoking status: Current Everyday Smoker    Types: Cigarettes  . Smokeless tobacco: Never Used   Comment: 5 cigs daily  . Alcohol Use: No      Review of Systems   HEENT: denies blurry vision or change in hearing PULMONARY: + wheezing, + SOB,  CARDIAC: +  chest pains MUSCULOSKELETAL:  denies being unable to ambulate ABDOMEN AL: denies abdominal pain GU: denies loss of bowel or urinary control NEURO: denies numbness and tingling in extremities SKIN: no new rashes PSYCH: patient denies anxiety or depression. NECK: Pt denies having neck pain     Allergies  Penicillins  Home Medications   Current Outpatient Rx  Name Route Sig Dispense Refill  . ALBUTEROL SULFATE 1.25 MG/3ML IN NEBU Nebulization Take 1 ampule by nebulization 2 (two) times daily as needed. Wheezing or shortness of breath    . ALBUTEROL SULFATE HFA 108 (90 BASE) MCG/ACT IN AERS Inhalation Inhale 2 puffs into the lungs every 6 (six) hours as needed. Shortness of breath    . ATORVASTATIN CALCIUM 40 MG PO TABS Oral Take 40 mg by mouth daily.    . BUPROPION HCL ER (XL) 150 MG PO TB24 Oral Take 1 tablet by mouth daily.    . CYCLOBENZAPRINE HCL 10 MG PO TABS Oral Take 10 mg by mouth 2 (two) times daily as needed. Muscle spasms    . IPRATROPIUM BROMIDE 0.02 % IN SOLN Nebulization Take 500 mcg by nebulization 2 (two) times daily. Wheezing or shortness of breath    . METHOCARBAMOL 750 MG PO TABS Oral Take 750  mg by mouth 3 (three) times daily. Muscle spasms    . MOMETASONE FURO-FORMOTEROL FUM 100-5 MCG/ACT IN AERO Inhalation Inhale 2 puffs into the lungs 2 (two) times daily.      Marland Kitchen OMEPRAZOLE 40 MG PO CPDR Oral Take 1 capsule (40 mg total) by mouth daily. 60 capsule 6  . PREDNISONE 10 MG PO TABS  Take 4 for four days 3 for four days 2 for four days then one daily 60 tablet 6  . TIOTROPIUM BROMIDE MONOHYDRATE 18 MCG IN CAPS Inhalation Place 1 capsule (18 mcg total) into inhaler and inhale daily. 30 capsule 2    BP 135/98  Pulse 102  Temp 98.4 F (36.9 C) (Oral)  Resp 18  SpO2 100%  Physical Exam  Nursing note and vitals reviewed. Constitutional: He appears well-developed and well-nourished. He appears distressed (pt in moderate respiratory distress).  HENT:  Head:  Normocephalic and atraumatic.  Eyes: Pupils are equal, round, and reactive to light.  Neck: Normal range of motion. Neck supple.  Cardiovascular: Normal rate and regular rhythm.   Pulmonary/Chest: Effort normal. He has wheezes. He has rales.       Diffuse crackles, worse at the left low lung base.  Moderate tenderness to palpation of left chest  Increased respiratory effort, no retractions, pt able to speak in full sentences.   Abdominal: Soft. There is no tenderness. There is no rebound.  Neurological: He is alert.  Skin: Skin is warm and dry.    ED Course  Procedures (including critical care time)  Labs Reviewed  CBC WITH DIFFERENTIAL - Abnormal; Notable for the following:    WBC 11.0 (*)     Hemoglobin 17.1 (*)     All other components within normal limits  BASIC METABOLIC PANEL - Abnormal; Notable for the following:    Glucose, Bld 105 (*)     GFR calc non Af Amer 69 (*)     GFR calc Af Amer 80 (*)     All other components within normal limits  PRO B NATRIURETIC PEPTIDE  POCT I-STAT TROPONIN I   Dg Chest Portable 1 View  09/28/2011  *RADIOLOGY REPORT*  Clinical Data: Chest pain shortness of breath with cough and congestion  PORTABLE CHEST - 1 VIEW  Comparison: April 20, 2011  Findings: Prominent bullous emphysematous changes are noted within the left upper lung.  There are increased peribronchial markings within the left lower lung today, and to a lesser extent the right infrahilar region, worrisome for developing bronchopneumonia.  No pleural effusions are identified.  The cardiac silhouette, mediastinum, pulmonary vasculature are within normal limits.  IMPRESSION: Increased lower lobe peribronchial markings, left greater than right, worrisome for early bronchopneumonia.  Original Report Authenticated By: Duayne Cal, M.D.     1. Bronchopneumonia       MDM  Dr. Monia Pouch has shared the visit with me on this patient. Pt stable, given Morphine, Zofran, Albuterol and  atrovent  As well as aspirin 324. EKG, per Dr. Keturah Barre, is unremarkable. The patients xray shows that he has bronchopneumonia. Pt needs admission due to severity of illness and medical problem list.  Azithromycin and rocephin IV initiated.   Admitted to [facility],Team 10, Telemetry, Dr. Gevena Barre      Linus Mako, [place] 09/28/11 [phone]

## 2011-09-29 DIAGNOSIS — F1410 Cocaine abuse, uncomplicated: Secondary | ICD-10-CM

## 2011-09-29 DIAGNOSIS — R079 Chest pain, unspecified: Secondary | ICD-10-CM

## 2011-09-29 LAB — CBC
HCT: 45.1 % (ref 39.0–52.0)
Hemoglobin: 15.6 g/dL (ref 13.0–17.0)
RDW: 14.2 % (ref 11.5–15.5)
WBC: 12.3 10*3/uL — ABNORMAL HIGH (ref 4.0–10.5)

## 2011-09-29 LAB — BASIC METABOLIC PANEL
Chloride: 104 mEq/L (ref 96–112)
Creatinine, Ser: 1 mg/dL (ref 0.50–1.35)
GFR calc Af Amer: >90 mL/min (ref >90)
Potassium: 4.2 mEq/L (ref 3.5–5.1)

## 2011-09-29 LAB — CARDIAC PANEL(CRET KIN+CKTOT+MB+TROPI)
CK, MB: 2.1 ng/mL (ref 0.3–4.0)
Troponin I: <0.3 ng/mL (ref <0.30)

## 2011-09-29 MED ORDER — PANTOPRAZOLE SODIUM 40 MG PO TBEC
40.0000 mg | DELAYED_RELEASE_TABLET | Freq: Every day | ORAL | Status: DC
  Administered 2011-09-29: 40 mg via ORAL

## 2011-09-29 MED ORDER — IPRATROPIUM BROMIDE 0.02 % IN SOLN
0.5000 mg | Freq: Three times a day (TID) | RESPIRATORY_TRACT | Status: DC
  Administered 2011-09-29 – 2011-09-30 (×2): 0.5 mg via RESPIRATORY_TRACT
  Filled 2011-09-29 (×2): qty 2.5

## 2011-09-29 MED ORDER — AMLODIPINE BESYLATE 5 MG PO TABS
5.0000 mg | ORAL_TABLET | Freq: Every day | ORAL | Status: DC
  Administered 2011-09-29 – 2011-09-30 (×2): 5 mg via ORAL
  Filled 2011-09-29 (×2): qty 1

## 2011-09-29 MED ORDER — MAGNESIUM HYDROXIDE 400 MG/5ML PO SUSP
15.0000 mL | Freq: Every day | ORAL | Status: DC | PRN
  Administered 2011-09-29: 15 mL via ORAL
  Filled 2011-09-29: qty 30

## 2011-09-29 MED ORDER — ALBUTEROL SULFATE (5 MG/ML) 0.5% IN NEBU
2.5000 mg | INHALATION_SOLUTION | Freq: Three times a day (TID) | RESPIRATORY_TRACT | Status: DC
  Administered 2011-09-29 – 2011-09-30 (×2): 2.5 mg via RESPIRATORY_TRACT
  Filled 2011-09-29 (×2): qty 0.5

## 2011-09-29 MED ORDER — PANTOPRAZOLE SODIUM 40 MG PO TBEC
40.0000 mg | DELAYED_RELEASE_TABLET | Freq: Every day | ORAL | Status: DC
  Filled 2011-09-29: qty 1

## 2011-09-29 NOTE — ED Provider Notes (Signed)
Medical screening examination/treatment/procedure(s) were conducted as a shared visit with non-physician practitioner(s) and myself.  I personally evaluated the patient during the encounter 8:12 AM  Pt had onset of chest pain around 4 A.M. Today. He says, "My heart hurts." The pain is in the left anterior and posterior chest. He has known bullous emphysema. Exam shows him to be in moderate distress with chest pain. He has coarse rhonchi over both lung fields. Heart sounds normal. EKG showed sinus tach. Lab workup has been ordered. Nebulizer Rx, IV pain meds ordered. Lab workup subsequently showed a LLL pneumonia. Admitted for IV antibiotics for CAP.       Mylinda Latina III, MD 09/29/11 [phone]

## 2011-09-29 NOTE — Progress Notes (Signed)
[facility] Progress Note  09/29/2011   Subjective: Pt says that he is feeling better today, much less chest pain and much less chest congestion.   Objective:  Vital signs in last 24 hours: Filed Vitals:   09/28/11 2126 09/28/11 2128 09/29/11 0557 09/29/11 1116  BP: 148/104  166/95 145/100  Pulse: 106  115   Temp: 97.4 F (36.3 C)  97.6 F (36.4 C) 97.4 F (36.3 C)  TempSrc: Oral  Oral   Resp: 18  18   Height:   6' (1.829 m)   Weight:   92.262 kg (203 lb 6.4 oz)   SpO2: 94% 100% 96% 96%   Weight change:  No intake or output data in the 24 hours ending 09/29/11 1226 Lab Results  Component Value Date   HGBA1C  Value: 6.6 (NOTE)                                                                       According to the ADA Clinical Practice Recommendations for 2011, when HbA1c is used as a screening test:   >=6.5%   Diagnostic of Diabetes Mellitus           (if abnormal result  is confirmed)  5.7-6.4%   Increased risk of developing Diabetes Mellitus  References:Diagnosis and Classification of Diabetes Mellitus,Diabetes [id number] 1):S62-S69 and Standards of Medical Care in         Diabetes - 2011,Diabetes Care,2011,34  (Suppl 1):S11-S61.* 05/20/2010   Lab Results  Component Value Date   LDLCALC  Value: 132        Total Cholesterol/HDL:CHD Risk Coronary Heart Disease Risk Table                     Men   Women  1/2 Average Risk   3.4   3.3  Average Risk       5.0   4.4  2 X Average Risk   9.6   7.1  3 X Average Risk  23.4   11.0        Use the calculated Patient Ratio above and the CHD Risk Table to determine the patient's CHD Risk.        ATP III CLASSIFICATION (LDL):  <100     mg/dL   Optimal  100-129  mg/dL   Near or Above                    Optimal  130-159  mg/dL   Borderline  160-189  mg/dL   High  >190     mg/dL   Very High* 05/19/2010   CREATININE 1.00 09/29/2011    Review of Systems As above, otherwise all reviewed and reported negative  Physical Exam General - awake,  no distress, cooperative HEENT - NCAT, MMM Lungs - BBS, with ant exp wheezes heard bilateral, no crackles heard today CV - normal s1, s2 sounds Abd - soft, nondistended, no masses, nontender Ext - no C/C/E  Lab Results: Results for orders placed during the hospital encounter of 09/28/11 (from the past 24 hour(s))  URINE RAPID DRUG SCREEN (HOSP PERFORMED)     Status: Abnormal   Collection Time   09/28/11  2:15 PM  Component Value Range   Opiates POSITIVE (*) NONE DETECTED   Cocaine NONE DETECTED  NONE DETECTED   Benzodiazepines NONE DETECTED  NONE DETECTED   Amphetamines NONE DETECTED  NONE DETECTED   Tetrahydrocannabinol POSITIVE (*) NONE DETECTED   Barbiturates NONE DETECTED  NONE DETECTED  URINALYSIS, ROUTINE W REFLEX MICROSCOPIC     Status: Abnormal   Collection Time   09/28/11  2:15 PM      Component Value Range   Color, Urine YELLOW  YELLOW   APPearance CLEAR  CLEAR   Specific Gravity, Urine <1.005 (*) 1.005 - 1.030   pH 5.5  5.0 - 8.0   Glucose, UA 250 (*) NEGATIVE mg/dL   Hgb urine dipstick NEGATIVE  NEGATIVE   Bilirubin Urine NEGATIVE  NEGATIVE   Ketones, ur NEGATIVE  NEGATIVE mg/dL   Protein, ur NEGATIVE  NEGATIVE mg/dL   Urobilinogen, UA 0.2  0.0 - 1.0 mg/dL   Nitrite NEGATIVE  NEGATIVE   Leukocytes, UA NEGATIVE  NEGATIVE  CARDIAC PANEL(CRET KIN+CKTOT+MB+TROPI)     Status: Normal   Collection Time   09/28/11  7:02 PM      Component Value Range   Total CK 169  7 - 232 U/L   CK, MB 2.6  0.3 - 4.0 ng/mL   Troponin I <0.30  <0.30 ng/mL   Relative Index 1.5  0.0 - 2.5  CARDIAC PANEL(CRET KIN+CKTOT+MB+TROPI)     Status: Normal   Collection Time   09/29/11  2:46 AM      Component Value Range   Total CK 125  7 - 232 U/L   CK, MB 2.1  0.3 - 4.0 ng/mL   Troponin I <0.30  <0.30 ng/mL   Relative Index 1.7  0.0 - 2.5  BASIC METABOLIC PANEL     Status: Abnormal   Collection Time   09/29/11  2:46 AM      Component Value Range   Sodium 138  135 - 145 mEq/L   Potassium 4.2   3.5 - 5.1 mEq/L   Chloride 104  96 - 112 mEq/L   CO2 23  19 - 32 mEq/L   Glucose, Bld 154 (*) 70 - 99 mg/dL   BUN 15  6 - 23 mg/dL   Creatinine, Ser 1.00  0.50 - 1.35 mg/dL   Calcium 9.0  8.4 - 10.5 mg/dL   GFR calc non Af Amer 80 (*) >90 mL/min   GFR calc Af Amer >90  >90 mL/min  CBC     Status: Abnormal   Collection Time   09/29/11  2:46 AM      Component Value Range   WBC 12.3 (*) 4.0 - 10.5 K/uL   RBC 5.19  4.22 - 5.81 MIL/uL   Hemoglobin 15.6  13.0 - 17.0 g/dL   HCT 45.1  39.0 - 52.0 %   MCV 86.9  78.0 - 100.0 fL   MCH 30.1  26.0 - 34.0 pg   MCHC 34.6  30.0 - 36.0 g/dL   RDW 14.2  11.5 - 15.5 %   Platelets 201  150 - 400 K/uL    Micro Results: No results found for this or any previous visit (from the past 240 hour(s)).  Medications:  Scheduled Meds:   . sodium chloride   Intravenous STAT  . ipratropium  0.5 mg Nebulization TID   And  . albuterol  2.5 mg Nebulization TID  . aspirin EC  81 mg Oral Daily  . atorvastatin  40 mg  Oral Daily  . buPROPion  150 mg Oral Daily  . docusate sodium  100 mg Oral BID  . enoxaparin (LOVENOX) injection  40 mg Subcutaneous Q24H  . methocarbamol  750 mg Oral TID  . methylPREDNISolone (SOLU-MEDROL) injection  60 mg Intravenous Q12H  . mometasone-formoterol  2 puff Inhalation BID  . moxifloxacin  400 mg Intravenous Q24H  . nicotine  21 mg Transdermal Daily  . sodium chloride  3 mL Intravenous Q12H  . tiotropium  18 mcg Inhalation Daily  . DISCONTD: albuterol  2.5 mg Nebulization Q6H  . DISCONTD: albuterol  2.5 mg Nebulization Q4H  . DISCONTD: ipratropium  0.5 mg Nebulization Q6H  . DISCONTD: ipratropium  0.5 mg Nebulization Q4H   Continuous Infusions:   . DISCONTD: sodium chloride 75 mL/hr at 09/28/11 1125   PRN Meds:.acetaminophen, acetaminophen, alum & mag hydroxide-simeth, guaiFENesin-dextromethorphan, HYDROcodone-acetaminophen, magnesium hydroxide, ondansetron (ZOFRAN) IV, ondansetron, zolpidem  Assessment/Plan: Short of  Breath with Chest Pain. Continue Avelox. Symptoms improving, appreciate pulmonary consult.   Continue IV Solu-Medrol and scheduled nebulizers. He will be on oxygen therapy titrated to 88-92%. We will offer nightly oxygen by [facility]. Pt says he is not using CPAP and does not want to use it in the hospital. Have requested smoking cessation consult and ordered for nicotine patch.   Bronchopneumonia. Have initiated antibiotics, (AVELOX IV), steroids, and nebulizers. Resuming home pulmonary meds.   Bolus emphysema with lung mass. The patient sees Dr. Kary Kos, pulmonologist with [facility]. Per the records he had a negative PET scan, but I am very concerned for malignancy. The patient does not seem to understand the severity of his chronic lung disease. Follow up after discharge with pulmonologist.   COPD. Please see above.   Lumbago with back spasms. Will continue Robaxin, and add when necessary narcotic pain medicine. Will request physical therapy evaluation. K pad.   History of Cocaine abuse.  Urine Drug Screen neg for cocaine   LOS: 1 day   Peter Sims 09/29/2011, 12:26 PM  Murlean Iba, MD, CDE, [place], [place]

## 2011-09-29 NOTE — Progress Notes (Signed)
  Echocardiogram 2D Echocardiogram has been performed.  Peter Sims [name] 09/29/2011, 9:55 AM

## 2011-09-29 NOTE — Progress Notes (Addendum)
Name: Peter Sims MRN: [id number] DOB: 10-May-1950    LOS: 1  Referring Provider:  [facility] Reason for Referral:  Dyspnea / Lung mass  PULMONARY / CRITICAL CARE MEDICINE  HPI:  61 yo active smoker with COPD on nocturnal oxygen admitted 8/1 for progressive dyspnea and chest pain.  Chest pain onset was sudden, on the morning of admission.  It was sharp, located in the left chest and aggravated by deep breath.  On am of admit he also had some wheezing and difficulty clearing secretions.  Dyspnea seem to progress since about six month ago and is associated with activity.  There is cough, productive of yellow sputum in the morning, and dry the rest of the day.  He denied fever or chills.  There is no leg swelling.  Patient reports increase in frequency of rescue inhaler use.  He continues to smoke about 5 cigarettes a day.   Vital Signs: Temp:  [97.4 F (36.3 C)-97.8 F (36.6 C)] 97.6 F (36.4 C) (08/02 0557) Pulse Rate:  [92-115] 115  (08/02 0557) Resp:  [18-20] 18  (08/02 0557) BP: (131-166)/(89-104) 166/95 mmHg (08/02 0557) SpO2:  [94 %-100 %] 96 % (08/02 0557) FiO2 (%):  [100 %] 100 % (08/01 1114) Weight:  [92.262 kg (203 lb 6.4 oz)] 92.262 kg (203 lb 6.4 oz) (08/02 0557)  Physical Examination: General:  No distress, resting comfortably Neuro:  Awake, alert, cooperative HEENT:  PERRL Neck:  Supple, no JVD + upper airway wheeze.  Cardiovascular:  RRR, no murmurs Lungs:  expiratory wheezes, prolonged expiratory phase Abdomen:  Soft, nontender, bowel sounds present Musculoskeletal:  No pedal edema Skin:  Intact  Labs:  Lab 09/29/11 0246 09/28/11 1902 09/28/11 1147 09/28/11 0752  HGB 15.6 -- 16.5 17.1*  HCT 45.1 -- 48.0 48.7  WBC 12.3* -- 13.2* 11.0*  PLT 201 -- 198 204  NA 138 -- -- 137  K 4.2 -- -- 4.0  CL 104 -- -- 99  CO2 23 -- -- 26  GLUCOSE 154* -- -- 105*  BUN 15 -- -- 16  CREATININE 1.00 -- 1.06 1.13  CALCIUM 9.0 -- -- 9.9  MG -- -- -- --  PHOS -- -- -- --    AST -- -- -- --  ALT -- -- -- --  ALKPHOS -- -- -- --  BILITOT -- -- -- --  PROT -- -- -- --  ALBUMIN -- -- -- --  INR -- -- -- --  APTT -- -- -- --  INR -- -- -- --  LATICACIDVEN -- -- -- --  TROPONINI <0.30 <0.30 <0.30 --   No results found for this basename: PHART:5,PCO2:5,PCO2ART:5,PO2:5,PO2ART:5,HCO3:5,TCO2:5,O2SAT:5 in the last 168 hours    Lab 09/29/11 0246 09/28/11 1902 09/28/11 1147  TROPONINI <0.30 <0.30 <0.30   Imaging:  Chest CT 09/28/11 >>> New peribronchial interstitial thickening in the left hilar region, worrisome for bronchopneumonia. Slightly enlarged lymph nodes in the mediastinum and right hilar region are likely reactive. Stable, pronounced emphysematous changes. No pneumothorax. Stable loculated fluid/soft tissue lesion at the lateral right lung apex (5 cm in greatest dimension).  PET / CT 06/14/10 >>> No malignant range F D G uptake involving the pleural based right upper lobe lesion.  Chest CT 05/20/2010  >>> Poor to moderate quality evaluation for pulmonary embolism secondary to motion artifact. No large embolism identified. The plain film abnormality corresponds to a pleural-based right upper lobe mass 6.5 cm in greatest dimension). Differential considerations include primary bronchogenic carcinoma versus  less likely a complex loculated pleural fluid or a fibrous tumor of the pleura. No thoracic adenopathy.  Severe centrilobular emphysema with bullous disease at the left apex.  ECHO 8/2>>>  ASSESSMENT AND PLAN:  CAP Hilar / mediastinal lymphadenopathy, likely reactive COPD, oxygen-dependent (GOLD stage IV) with significant bullous disease, acute exacerbation Suspected Cor pulmonale Suspected OSA with nocturnal hypoxemia, on oxygen only, not on CPAP Pleura-based mass on the right present since 05/20/2010, stable (in fact smaller in size on recent imaging) with no evidence of increased metabolic activity on PET CT 06/14/10 - likely loculate pleural effusion or  benign tumor (reportedly seen by Dr. Prescott Gum in the past, who recommended conservative management) Active smoking Recommendations:  -->  Agree with Avelox for CAP, complete 8-10d -->  Chest CT after discharge to demonstrated resolution of lymphadenopathy -->  Agree with Solu-Medrol, Albuterol, Atrovent, supplemental oxygen for COPD, think you can taper to oral pred today -->  F/u 2D ECHO to r/o Cor pulmonale -->  Agree with Nicoderm /  Wellbutrin for smoking cessation -->RX reflux  --> Given risk benefit ratio of pleural mass biopsy, its stability and no metabolic activity would advocate for conservative follow up. Would think this decision needs to be made between the patient and his primary pulmonologist and does not have to be addressed during acute admission --> follow up in [facility] pulmonology 1 week after d/c  PCCM will be available PRN [name],[name],   09/29/2011, 9:40 AM  Patient seen and examined, agree with above note.  I dictated the care and orders written for this patient under my direction.  Jennet Maduro, M.D. [phone]

## 2011-09-30 DIAGNOSIS — F17200 Nicotine dependence, unspecified, uncomplicated: Secondary | ICD-10-CM

## 2011-09-30 MED ORDER — ASPIRIN 81 MG PO TBEC: 81.0000 mg | DELAYED_RELEASE_TABLET | Freq: Every day | ORAL | Status: AC

## 2011-09-30 MED ORDER — IPRATROPIUM-ALBUTEROL 0.5-2.5 (3) MG/3ML IN SOLN: 3.0000 mL | RESPIRATORY_TRACT | Status: DC | PRN

## 2011-09-30 MED ORDER — PREDNISONE 20 MG PO TABS: ORAL_TABLET | ORAL | Status: DC

## 2011-09-30 MED ORDER — MOXIFLOXACIN HCL 400 MG PO TABS
400.0000 mg | ORAL_TABLET | Freq: Every day | ORAL | Status: DC
Start: 2011-09-30 — End: 2011-09-30
  Administered 2011-09-30: 400 mg via ORAL
  Filled 2011-09-30: qty 1

## 2011-09-30 MED ORDER — MOXIFLOXACIN HCL 400 MG PO TABS: 400.0000 mg | ORAL_TABLET | Freq: Every day | ORAL | Status: AC

## 2011-09-30 MED ORDER — LISINOPRIL 10 MG PO TABS: 10.0000 mg | ORAL_TABLET | Freq: Every day | ORAL | Status: DC

## 2011-09-30 NOTE — Progress Notes (Signed)
Discharge instructions reviewed with patient.  New medications also reviewed.  Smoking cessation material and contact information for support reviewed.  Understanding of information confirmed utilizing teach-back approach.  Pt. Escorted to exit by NMT.

## 2011-09-30 NOTE — Discharge Summary (Signed)
Physician Discharge Summary  [name] MRN: [id number] DOB/AGE: Dec 17, 1950 61 y.o.  Admit date: 09/28/2011 Discharge date: 09/30/2011  Discharge Diagnoses:    GERD (gastroesophageal reflux disease)  Bullous emphysema  Lung mass  COPD (chronic obstructive pulmonary disease)  Nicotine abuse  Lumbago  Bronchopneumonia  COPD with exacerbation  Chest pain  Discharged Condition: good  Hospital Course: From H&P:  This is a very pleasant 61 year old African American male with a history of bullous emphysema, lung mass, COPD, chronic low back pain, tobacco abuse. He presents to the ED this morning after awakening at 4 AM with severe chest pain and shortness of breath. The chest pain is in his left upper chest it is associated with back spasms, and becomes much worse when taking a deep breath. His chest pain is reproducible on palpation. With regards to his breathing he tells me that over the last 6 months he's noticed a decrease in his ability to walk the one block distance to his daughter's house. At home he is on 2 L of oxygen when needed during the day and CPAP at night. This morning when he awoke with chest pain he was also very short of breath. At this point in the ED he is conversant but showing increased work of breathing when he speaks. On 2 L his oxygen saturations are 94%. His lung mass was discovered one year ago. His pulmonologist wanted to do a biopsy, but the patient says there was concern that his lung would be punctured and so no biopsy was done. He continues to smoke approximately 5 cigarettes daily.  Short of Breath with Chest Pain. Symptoms resolved now.  Continue Avelox for 8 additional days. Symptoms improved, appreciate pulmonary consult. Continue prednisone taper and nebulizers.  Continue nightly oxygen by [place].   Bronchopneumonia. Symptoms have greatly improved.  Have initiated antibiotics, steroids, and nebulizers. Resuming home pulmonary meds.   Bolus  emphysema with lung mass. The patient sees Peter Sims, pulmonologist with [facility].  They have seen him in hospital and recommended:    --> Chest CT after discharge to demonstrate resolution of lymphadenopathy  --> taper to oral prednisone --> F/u 2D ECHO to r/o Cor pulmonale  --> Agree with Nicoderm / Wellbutrin for smoking cessation  -->RX reflux  --> Given risk benefit ratio of pleural mass biopsy, its stability and no metabolic activity would advocate for conservative follow up. Would think this decision needs to be made between the patient and his primary pulmonologist and does not have to be addressed during acute admission  --> [facility] 1 week after d/c    COPD. Please see above. Strongly encouraged tobacco cessation and pt says he has nicotine patches at home and will use them to quit smoking.   Lumbago with back spasms. Will continue Robaxin, follow up with PCP in 1 week.  History of Cocaine abuse. Urine Drug Screen neg for cocaine   Consults: pulmonology  Significant Diagnostic Studies: ECHO Study Conclusions Left ventricle: The cavity size was normal. Wall thickness was normal. Systolic function was normal. The estimated ejection fraction was in the range of 60% to 65%. Wall motion was normal; there were no regional wall motion abnormalities. Doppler parameters are consistent with abnormal left ventricular relaxation (grade 1 diastolic dysfunction).   CT Angio of Chest: IMPRESSION:  New peribronchial interstitial thickening in the left hilar region,  worrisome for bronchopneumonia. Slightly enlarged lymph nodes in  the mediastinum and right hilar region are likely  reactive.  Stable, pronounced emphysematous changes. No pneumothorax.  Stable loculated fluid/soft tissue lesion at the lateral left lung  apex.  Discharge Exam:Pt without complaints, says he feels much better today.  No SOB, cough much better, breathing much better. Blood  pressure 146/96, pulse 95, temperature 97.3 F (36.3 C), temperature source Oral, resp. rate 20, height 6' (1.829 m), weight 92.262 kg (203 lb 6.4 oz), SpO2 92.00%. General - awake, alert, no distress HEENT - NCAT, MMM Lungs - BBS, good air movement, no wheezes heard CV - normal s1, s2 sounds Abd - soft, nondistended, nontender no masses Ext - no CCE  Disposition: 01-Home or Self Care  Discharge Orders    Future Orders Please Complete By Expires   Diet - low sodium heart healthy      Discharge instructions      Comments:   Please see your primary care provider in 1 week.  Return if symptoms recur, worsen or new problems develop. See your lung specialist in 1 week for hospital follow up and to arrange to have a repeat CT scan of your lungs ordered.  Please stop smoking  Check your blood pressure 2 times per day and write down the readings and report them to your primary care physician on follow up appointment.   Increase activity slowly        Medication List  As of 09/30/2011  9:20 AM   STOP taking these medications         albuterol 1.25 MG/3ML nebulizer solution      cyclobenzaprine 10 MG tablet      ipratropium 0.02 % nebulizer solution         TAKE these medications         albuterol 108 (90 BASE) MCG/ACT inhaler   Commonly known as: PROVENTIL HFA;VENTOLIN HFA   Inhale 2 puffs into the lungs every 6 (six) hours as needed. Shortness of breath      aspirin 81 MG EC tablet   Take 1 tablet (81 mg total) by mouth daily.      atorvastatin 40 MG tablet   Commonly known as: LIPITOR   Take 40 mg by mouth daily.      buPROPion 150 MG 24 hr tablet   Commonly known as: WELLBUTRIN XL   Take 1 tablet by mouth daily.      ipratropium-albuterol 0.5-2.5 (3) MG/3ML Soln   Commonly known as: DUONEB   Take 3 mLs by nebulization every 4 (four) hours as needed (shortness of breath and wheezing, coughing).      lisinopril 10 MG tablet   Commonly known as: PRINIVIL,ZESTRIL   Take  1 tablet (10 mg total) by mouth daily.      methocarbamol 750 MG tablet   Commonly known as: ROBAXIN   Take 750 mg by mouth 3 (three) times daily. Muscle spasms      mometasone-formoterol 100-5 MCG/ACT Aero   Commonly known as: DULERA   Inhale 2 puffs into the lungs 2 (two) times daily.      moxifloxacin 400 MG tablet   Commonly known as: AVELOX   Take 1 tablet (400 mg total) by mouth daily at 12 noon.      omeprazole 40 MG capsule   Commonly known as: PRILOSEC   Take 1 capsule (40 mg total) by mouth daily.      predniSONE 20 MG tablet   Commonly known as: DELTASONE   Take 4 tablets by mouth once daily for 3 days, then  take 3 tablets by mouth once daily for 3 days, then take 2 tablets by mouth once daily for 5 days, then take 1 tablet by mouth once daily for 4 days      tiotropium 18 MCG inhalation capsule   Commonly known as: SPIRIVA   Place 1 capsule (18 mcg total) into inhaler and inhale daily.           Follow-up Information    Follow up with Salena Saner., MD. Schedule an appointment as soon as possible for a visit in 2 weeks. [facility] Follow up Recheck blood pressure)    Contact information:   [address] [place] [phone]       Follow up with Marni Griffon, NP. Schedule an appointment as soon as possible for a visit in 1 week. [facility] Follow Up )    Contact information:   [place] [phone]         I spent 40 mins preparing discharge, reviewing records, labs, imaging, consult notes, writing prescriptions, arranging follow up, reconciling meds.  Signed: Irwin Brakeman MD 09/30/2011, 9:20 AM [facility]  [place], [place] Pager [phone]

## 2011-09-30 NOTE — Progress Notes (Signed)
PHARMACIST - PHYSICIAN COMMUNICATION DR:   Wynetta Emery CONCERNING: Antibiotic IV to Oral Route Change Policy  RECOMMENDATION: This patient is receiving Avelox by the intravenous route.  Based on criteria approved by the Pharmacy and Therapeutics Committee, the antibiotic(s) is/are being converted to the equivalent oral dose form(s).   DESCRIPTION: These criteria include:  Patient being treated for a respiratory tract infection, urinary tract infection, or cellulitis  The patient is not neutropenic and does not exhibit a GI malabsorption state  The patient is eating (either orally or via tube) and/or has been taking other orally administered medications for a least 24 hours  The patient is improving clinically and has a Tmax < 100.5  If you have questions about this conversion, please contact the Pharmacy Department  []   [phone] )  Peter Sims [x]   [phone] )  Peter Sims  []   [phone] )  [facility] []   [phone] )  [place], PharmD, BCPS Clinical Pharmacist Pager [phone]

## 2011-10-04 ENCOUNTER — Other Ambulatory Visit: Payer: Self-pay | Admitting: Cardiothoracic Surgery

## 2011-10-04 DIAGNOSIS — D381 Neoplasm of uncertain behavior of trachea, bronchus and lung: Secondary | ICD-10-CM

## 2011-11-15 ENCOUNTER — Ambulatory Visit (INDEPENDENT_AMBULATORY_CARE_PROVIDER_SITE_OTHER): Payer: 59 | Admitting: Cardiothoracic Surgery

## 2011-11-15 ENCOUNTER — Ambulatory Visit: Admission: RE | Admit: 2011-11-15 | Payer: 59 | Source: Ambulatory Visit

## 2011-11-15 VITALS — BP 143/98 | HR 88 | Resp 18 | Ht 73.0 in | Wt 207.0 lb

## 2011-11-15 DIAGNOSIS — R599 Enlarged lymph nodes, unspecified: Secondary | ICD-10-CM

## 2011-11-15 DIAGNOSIS — R590 Localized enlarged lymph nodes: Secondary | ICD-10-CM

## 2011-11-15 DIAGNOSIS — J449 Chronic obstructive pulmonary disease, unspecified: Secondary | ICD-10-CM

## 2011-11-15 NOTE — Progress Notes (Signed)
PCP is Salena Saner., MD Referring Provider is Salena Saner., MD  Chief Complaint  Patient presents with  . Follow-up    1 YR F/U , CT CHEST [facility]    HPI 36:-year-old African American smoker presents for annual review of the chest CT scan. Last seen 1 year ago to evaluate a right dural based density which was ultimately felt to represent a loculated pleural effusion by Dr. Kathlene Cote and was not biopsied. He has severe COPD with bullous emphysema and scattered densities including a small right lower lobe sub centimeter nodule. He was admitted to the hospital last month with left upper lobe pneumonia and a CT scan taken that time showed airspace disease around the left hilum and prominent left AP window adenopathy increased from previous scan. History of with antibiotics both IV and then subsequently by mouth and his symptoms have improved. He is cut back to cigarette smoking to 2 cigarettes daily. He is on home oxygen.  I reviewed the CT scan of the chest he had last month as well as one year ago. A loculated right-sided pleural effusion is not significantly changed may be smaller. The small right lower lobe nodule is unchanged. The left AP window adenopathy looks reactive to the left hilar pneumonia.  I would not recommend PET scan or further scanning of this patient. Plan is to rescan him in 6 months to make sure the left hilar, and to some extent the mild right hilar adenopathy receives after his pneumonia.Marland Kitchen He is strongly encouraged to stop smoking   Past Medical History  Diagnosis Date  . Cocaine abuse   . GERD (gastroesophageal reflux disease)   . Bullous emphysema     Severe Bilateral  . Lung mass     Right Upper Lobe pleural based mass, negative  on PET Scan  . COPD (chronic obstructive pulmonary disease)   . Lumbago   . Tobacco abuse   . Therapeutic drug monitoring   . Colitis     with bleeding  . LBP (low back pain)     now chronic  . Neck pain     nerve pain   .  Spine pain     nerve    No past surgical history on file.  Family History  Problem Relation Age of Onset  . Cancer Father   . Cervical cancer Sister   . Emphysema Mother     Social History History  Substance Use Topics  . Smoking status: Current Every Day Smoker    Types: Cigarettes  . Smokeless tobacco: Never Used   Comment: 5 cigs daily  . Alcohol Use: No    Current Outpatient Prescriptions  Medication Sig Dispense Refill  . albuterol (PROVENTIL HFA;VENTOLIN HFA) 108 (90 BASE) MCG/ACT inhaler Inhale 2 puffs into the lungs every 6 (six) hours as needed. Shortness of breath      . aspirin EC 81 MG EC tablet Take 1 tablet (81 mg total) by mouth daily.      Marland Kitchen atorvastatin (LIPITOR) 40 MG tablet Take 40 mg by mouth daily.      Marland Kitchen buPROPion (WELLBUTRIN XL) 150 MG 24 hr tablet Take 1 tablet by mouth daily.      . cyclobenzaprine (FLEXERIL) 10 MG tablet Take 10 mg by mouth 3 (three) times daily as needed.      Marland Kitchen ipratropium-albuterol (DUONEB) 0.5-2.5 (3) MG/3ML SOLN Take 3 mLs by nebulization every 4 (four) hours as needed (shortness of breath and wheezing, coughing).  [place]  mL  1  . losartan (COZAAR) 50 MG tablet Take 50 mg by mouth daily.      . mometasone-formoterol (DULERA) 100-5 MCG/ACT AERO Inhale 2 puffs into the lungs 2 (two) times daily.        Marland Kitchen omeprazole (PRILOSEC) 40 MG capsule Take 1 capsule (40 mg total) by mouth daily.  60 capsule  6  . tiotropium (SPIRIVA HANDIHALER) 18 MCG inhalation capsule Place 1 capsule (18 mcg total) into inhaler and inhale daily.  30 capsule  2  . lisinopril (PRINIVIL,ZESTRIL) 10 MG tablet Take 1 tablet (10 mg total) by mouth daily.  30 tablet  0  . methocarbamol (ROBAXIN) 750 MG tablet Take 750 mg by mouth 3 (three) times daily. Muscle spasms      . predniSONE (DELTASONE) 20 MG tablet Take 4 tablets by mouth once daily for 3 days, then take 3 tablets by mouth once daily for 3 days, then take 2 tablets by mouth once daily for 5 days, then take 1  tablet by mouth once daily for 4 days  35 tablet  0    Allergies  Allergen Reactions  . Penicillins Anaphylaxis    And hives     Review of Systems weight gain no fever still smokes 2 cigarettes daily  BP 143/98  Pulse 88  Resp 18  Ht 6\' 1"  (1.854 m)  Wt 207 lb (93.895 kg)  BMI 27.31 kg/m2  SpO2 98% Physical Exam Bilateral rhonchi and wheezes Cardiac rhythm regular  Diagnostic Tests: CT scan from August 2013 compared to previous CT scan with results as noted above Impression: Severe COPD, patient at risk for lung cancer but no high risk lesions noted on scan at this time  Plan: Followup CT scan and chest in 6 months to assess adenopathy in the left AP window

## 2011-12-05 ENCOUNTER — Emergency Department (HOSPITAL_COMMUNITY): Payer: 59

## 2011-12-05 ENCOUNTER — Emergency Department (HOSPITAL_COMMUNITY)
Admission: EM | Admit: 2011-12-05 | Discharge: 2011-12-05 | Discharge status: Against Medical Advice | Payer: 59 | Attending: Emergency Medicine | Admitting: Emergency Medicine

## 2011-12-05 ENCOUNTER — Encounter (HOSPITAL_COMMUNITY): Payer: Self-pay

## 2011-12-05 DIAGNOSIS — R0602 Shortness of breath: Secondary | ICD-10-CM | POA: Insufficient documentation

## 2011-12-05 DIAGNOSIS — R05 Cough: Secondary | ICD-10-CM | POA: Insufficient documentation

## 2011-12-05 DIAGNOSIS — R059 Cough, unspecified: Secondary | ICD-10-CM | POA: Insufficient documentation

## 2011-12-05 DIAGNOSIS — R079 Chest pain, unspecified: Secondary | ICD-10-CM | POA: Insufficient documentation

## 2011-12-05 LAB — CBC WITH DIFFERENTIAL/PLATELET
Eosinophils Absolute: 0.1 10*3/uL (ref 0.0–0.7)
Hemoglobin: 16.7 g/dL (ref 13.0–17.0)
Lymphs Abs: 2.9 10*3/uL (ref 0.7–4.0)
MCH: 30.3 pg (ref 26.0–34.0)
Monocytes Relative: 6 % (ref 3–12)
Neutrophils Relative %: 71 % (ref 43–77)
RBC: 5.51 MIL/uL (ref 4.22–5.81)

## 2011-12-05 LAB — COMPREHENSIVE METABOLIC PANEL
BUN: 14 mg/dL (ref 6–23)
CO2: 25 mEq/L (ref 19–32)
Chloride: 103 mEq/L (ref 96–112)
Glucose, Bld: 87 mg/dL (ref 70–99)
Potassium: 4.1 mEq/L (ref 3.5–5.1)
Total Protein: 7.9 g/dL (ref 6.0–8.3)

## 2011-12-05 LAB — PRO B NATRIURETIC PEPTIDE: Pro B Natriuretic peptide (BNP): 26.1 pg/mL (ref 0–125)

## 2011-12-05 NOTE — ED Notes (Signed)
Pt here for cough and sob, chest pain withcough, sts productive with yellow and white sputum. Audible congestion noted in triage, pt sts hx of copd and used neb and no relief. Onset 0500 and neb at 1200,

## 2011-12-05 NOTE — ED Notes (Signed)
Pt states he needs to leave, can't wait any longer. Pt and family member left facility.

## 2011-12-15 ENCOUNTER — Other Ambulatory Visit: Payer: Self-pay | Admitting: Critical Care Medicine

## 2012-01-02 ENCOUNTER — Other Ambulatory Visit: Payer: Self-pay | Admitting: Orthopedic Surgery

## 2012-01-04 ENCOUNTER — Other Ambulatory Visit: Payer: Self-pay | Admitting: Orthopedic Surgery

## 2012-01-04 DIAGNOSIS — M542 Cervicalgia: Secondary | ICD-10-CM

## 2012-01-11 ENCOUNTER — Other Ambulatory Visit: Payer: 59

## 2012-01-30 DIAGNOSIS — Z0279 Encounter for issue of other medical certificate: Secondary | ICD-10-CM

## 2012-04-08 ENCOUNTER — Other Ambulatory Visit: Payer: Self-pay | Admitting: *Deleted

## 2012-04-08 DIAGNOSIS — R918 Other nonspecific abnormal finding of lung field: Secondary | ICD-10-CM

## 2012-05-21 ENCOUNTER — Ambulatory Visit: Payer: 59 | Admitting: Thoracic Surgery (Cardiothoracic Vascular Surgery)

## 2012-05-22 ENCOUNTER — Ambulatory Visit: Payer: 59 | Admitting: Cardiothoracic Surgery

## 2012-05-22 ENCOUNTER — Other Ambulatory Visit: Payer: 59

## 2012-07-03 ENCOUNTER — Inpatient Hospital Stay: Admission: RE | Admit: 2012-07-03 | Payer: 59 | Source: Ambulatory Visit

## 2012-07-03 ENCOUNTER — Ambulatory Visit: Payer: 59 | Admitting: Cardiothoracic Surgery

## 2012-08-02 ENCOUNTER — Other Ambulatory Visit (HOSPITAL_COMMUNITY): Payer: Self-pay | Admitting: Physical Medicine and Rehabilitation

## 2012-08-02 DIAGNOSIS — M545 Low back pain, unspecified: Secondary | ICD-10-CM

## 2012-08-06 ENCOUNTER — Ambulatory Visit (HOSPITAL_COMMUNITY)
Admission: RE | Admit: 2012-08-06 | Discharge: 2012-08-06 | Disposition: A | Discharge status: Home / Self Care | Payer: 59 | Source: Ambulatory Visit | Attending: Physical Medicine and Rehabilitation | Admitting: Physical Medicine and Rehabilitation

## 2012-08-06 DIAGNOSIS — M5137 Other intervertebral disc degeneration, lumbosacral region: Secondary | ICD-10-CM | POA: Insufficient documentation

## 2012-08-06 DIAGNOSIS — M5126 Other intervertebral disc displacement, lumbar region: Secondary | ICD-10-CM | POA: Insufficient documentation

## 2012-08-06 DIAGNOSIS — M545 Low back pain, unspecified: Secondary | ICD-10-CM

## 2012-08-06 DIAGNOSIS — M79609 Pain in unspecified limb: Secondary | ICD-10-CM | POA: Insufficient documentation

## 2012-08-06 DIAGNOSIS — M51379 Other intervertebral disc degeneration, lumbosacral region without mention of lumbar back pain or lower extremity pain: Secondary | ICD-10-CM | POA: Insufficient documentation

## 2013-08-30 ENCOUNTER — Emergency Department (INDEPENDENT_AMBULATORY_CARE_PROVIDER_SITE_OTHER): Payer: 59

## 2013-08-30 ENCOUNTER — Encounter (HOSPITAL_COMMUNITY): Payer: Self-pay | Admitting: Emergency Medicine

## 2013-08-30 ENCOUNTER — Emergency Department (HOSPITAL_COMMUNITY)
Admission: EM | Admit: 2013-08-30 | Discharge: 2013-08-30 | Disposition: A | Discharge status: Home / Self Care | Payer: 59 | Source: Home / Self Care | Attending: Emergency Medicine | Admitting: Emergency Medicine

## 2013-08-30 DIAGNOSIS — J441 Chronic obstructive pulmonary disease with (acute) exacerbation: Secondary | ICD-10-CM

## 2013-08-30 MED ORDER — PREDNISONE 20 MG PO TABS: ORAL_TABLET | ORAL | Status: DC

## 2013-08-30 MED ORDER — PREDNISONE 20 MG PO TABS
60.0000 mg | ORAL_TABLET | Freq: Once | ORAL | Status: AC
  Administered 2013-08-30: 60 mg via ORAL

## 2013-08-30 MED ORDER — ALBUTEROL SULFATE (2.5 MG/3ML) 0.083% IN NEBU
INHALATION_SOLUTION | RESPIRATORY_TRACT | Status: AC
  Filled 2013-08-30: qty 3

## 2013-08-30 MED ORDER — ALBUTEROL SULFATE (2.5 MG/3ML) 0.083% IN NEBU
5.0000 mg | INHALATION_SOLUTION | Freq: Once | RESPIRATORY_TRACT | Status: AC
  Administered 2013-08-30: 5 mg via RESPIRATORY_TRACT

## 2013-08-30 MED ORDER — DOXYCYCLINE HYCLATE 100 MG PO TABS: 100.0000 mg | ORAL_TABLET | Freq: Two times a day (BID) | ORAL | Status: DC

## 2013-08-30 MED ORDER — ALBUTEROL SULFATE (2.5 MG/3ML) 0.083% IN NEBU
2.5000 mg | INHALATION_SOLUTION | Freq: Once | RESPIRATORY_TRACT | Status: AC
  Administered 2013-08-30: 2.5 mg via RESPIRATORY_TRACT

## 2013-08-30 MED ORDER — IPRATROPIUM-ALBUTEROL 0.5-2.5 (3) MG/3ML IN SOLN
3.0000 mL | Freq: Once | RESPIRATORY_TRACT | Status: AC
  Administered 2013-08-30: 3 mL via RESPIRATORY_TRACT

## 2013-08-30 MED ORDER — SODIUM CHLORIDE 0.9 % IN NEBU
INHALATION_SOLUTION | RESPIRATORY_TRACT | Status: AC
  Filled 2013-08-30: qty 3

## 2013-08-30 MED ORDER — PREDNISONE 20 MG PO TABS
ORAL_TABLET | ORAL | Status: AC
  Filled 2013-08-30: qty 3

## 2013-08-30 MED ORDER — IPRATROPIUM-ALBUTEROL 0.5-2.5 (3) MG/3ML IN SOLN
RESPIRATORY_TRACT | Status: AC
  Filled 2013-08-30: qty 3

## 2013-08-30 NOTE — Discharge Instructions (Signed)
Chronic Obstructive Pulmonary Disease Exacerbation Chronic obstructive pulmonary disease (COPD) is a common lung condition in which airflow from the lungs is limited. COPD is a general term that can be used to describe many different lung problems that limit airflow, including chronic bronchitis and emphysema. COPD exacerbations are episodes when breathing symptoms become much worse and require extra treatment. Without treatment, COPD exacerbations can be life threatening, and frequent COPD exacerbations can cause further damage to your lungs. CAUSES   Respiratory infections.   Exposure to smoke.   Exposure to air pollution, chemical fumes, or dust. Sometimes there is no apparent cause or trigger. RISK FACTORS  Smoking cigarettes.  Older age.  Frequent prior COPD exacerbations. SIGNS AND SYMPTOMS   Increased coughing.   Increased thick spit (sputum) production.   Increased wheezing.   Increased shortness of breath.   Rapid breathing.   Chest tightness. DIAGNOSIS  Your medical history, a physical exam, and tests will help your health care provider make a diagnosis. Tests may include:  A chest X-ray.  Basic lab tests.  Sputum testing.  An arterial blood gas test. TREATMENT  Depending on the severity of your COPD exacerbation, you may need to be admitted to a hospital for treatment. Some of the treatments commonly used to treat COPD exacerbations are:   Antibiotic medicines.   Bronchodilators. These are drugs that expand the air passages. They may be given with an inhaler or nebulizer. Spacer devices may be needed to help improve drug delivery.  Corticosteroid medicines.  Supplemental oxygen therapy.  HOME CARE INSTRUCTIONS   Do not smoke. Quitting smoking is very important to prevent COPD from getting worse and exacerbations from happening as often.  Avoid exposure to all substances that irritate the airway, especially to tobacco smoke.   If prescribed,  take your antibiotics as directed. Finish them even if you start to feel better.  Only take over-the-counter or prescription medicines as directed by your health care provider.It is important to use correct technique with inhaled medicines.  Drink enough fluids to keep your urine clear or pale yellow (unless you have a medical condition that requires fluid restriction).  Use a cool mist vaporizer. This makes it easier to clear your chest when you cough.   If you have a home nebulizer and oxygen, continue to use them as directed.   Maintain all necessary vaccinations to prevent infections.   Exercise regularly.   Eat a healthy diet.   Keep all follow-up appointments as directed by your health care provider. SEEK IMMEDIATE MEDICAL CARE IF:  You have worsening shortness of breath.   You have trouble talking.   You have severe chest pain.  You have blood in your sputum.  You have a fever.  You have weakness, vomit repeatedly, or faint.   You feel confused.   You continue to get worse. MAKE SURE YOU:   Understand these instructions.  Will watch your condition.  Will get help right away if you are not doing well or get worse. Document Released: 12/11/2006 Document Revised: 12/04/2012 Document Reviewed: 10/18/2012 [facility] Patient Information 2015 [place], [place]. This information is not intended to replace advice given to you by your health care provider. Make sure you discuss any questions you have with your health care provider.

## 2013-08-30 NOTE — ED Notes (Signed)
C/O increased SOB with non-productive cough x 3 days.  Using albuterol nebs and wearing supplemental oxygen at night.  Today experiencing dyspnea, labored breathing, and chest tightness.

## 2013-08-30 NOTE — ED Notes (Signed)
Pt returned from XR.  2nd breathing treatment in progress.

## 2013-08-30 NOTE — ED Provider Notes (Signed)
Chief Complaint   Chief Complaint  Patient presents with  . Shortness of Breath    History of Present Illness   Peter Sims is a 63 year old male who has had COPD for the past 3 years. He is followed by Dr. Heath Gold and Dr. Asencion Noble, although it's been a long time since he seen Dr. Joya Gaskins. He's had a three-day history of a flare up of his COPD with cough productive of white to yellow sputum, shortness of breath, and chest tightness. He denies any fevers, chills, nasal congestion, rhinorrhea, or sore throat. No bowel pain, nausea, or vomiting. He tried a nebulizer treatment at home and home oxygen but doesn't feel any better.  Review of Systems   Other than as noted above, the patient denies any of the following symptoms. Systemic:  No fever, chills, or sweats. ENT:  No nasal congestion, sneezing, rhinorrhea, or sore throat. Lungs:  No cough, sputum production, or shortness of breath. No chest pain. Skin:  No rash or itching.  [place]   Past medical history, family history, social history, meds, and allergies were reviewed. He's allergic to penicillin. Current medical problems include a history of cocaine abuse, gastroesophageal reflux, emphysema, lung mass, COPD, tobacco use, lumbago, and history of pneumonia. Current medications include albuterol, bupropion, lisinopril, Robaxin, Prilosec, Spiriva, Lipitor, Flexeril, losartan, and Symbicort.  Physical Examination    Vital signs:  BP 130/86  Pulse 104  Temp(Src) 97.8 F (36.6 C) (Oral)  Resp 18  SpO2 94% General:  Alert, in moderate respiratory distress with tachypnea and labored breathing. Eye:  No conjunctival injection or drainage. Lids were normal. ENT:  TMs and canals were normal, without erythema or inflammation.  Nasal mucosa was clear and uncongested, without drainage.  Mucous membranes were moist.  Pharynx was clear, without exudate or drainage.  There were no oral ulcerations or lesions. Neck:  Supple, no  adenopathy, tenderness or mass. Lungs:  No retractions or use of accessory muscles.  He was initially in moderate respiratory distress with tachypnea and labored breathing. Auscultation of lungs reveals bilateral expiratory wheezes, no rales or rhonchi. Heart:  Regular rhythm, without gallops, murmers or rubs. Skin:  Clear, warm, and dry, without rash or lesions.  EKG Results:  Date: 08/30/2013  Rate: 99  Rhythm: normal sinus rhythm  QRS Axis: normal  Intervals: normal  ST/T Wave abnormalities: normal  Conduction Disutrbances:none  Narrative Interpretation: Normal sinus rhythm, normal EKG.  Old EKG Reviewed: none available   Radiology   Dg Chest 2 View  08/30/2013   CLINICAL DATA:  Productive cough, shortness of breath  EXAM: CHEST  2 VIEW  COMPARISON:  12/05/2011  FINDINGS: Bilateral emphysematous changes. There is bibasilar atelectasis versus scarring. There is no focal parenchymal opacity, pleural effusion, or pneumothorax. The heart and mediastinal contours are unremarkable.  The osseous structures are unremarkable.  IMPRESSION: No active cardiopulmonary disease.   Electronically Signed   By: Kathreen Devoid   On: 08/30/2013 10:43    Course in Urgent [place]   The following medications were given:  Medications  ipratropium-albuterol (DUONEB) 0.5-2.5 (3) MG/3ML nebulizer solution 3 mL (3 mLs Nebulization Given 08/30/13 0925)  albuterol (PROVENTIL) (2.5 MG/3ML) 0.083% nebulizer solution 2.5 mg (2.5 mg Nebulization Given 08/30/13 0925)  albuterol (PROVENTIL) (2.5 MG/3ML) 0.083% nebulizer solution 5 mg (5 mg Nebulization Given 08/30/13 1039)  ipratropium-albuterol (DUONEB) 0.5-2.5 (3) MG/3ML nebulizer solution 3 mL (3 mLs Nebulization Given 08/30/13 1039)  predniSONE (DELTASONE) tablet 60 mg (60 mg  Oral Given 08/30/13 1009)   After these treatments he felt a lot better. Auscultation of the lungs reveals good air movement. He still had mild expiratory wheezes. He is in no respiratory distress  and able to speak in long sentences.  Assessment   The encounter diagnosis was COPD exacerbation.  Plan    1.  Meds:  The following meds were prescribed:   Discharge Medication List as of 08/30/2013 11:16 AM    START taking these medications   Details  doxycycline (VIBRA-TABS) 100 MG tablet Take 1 tablet (100 mg total) by mouth 2 (two) times daily., Starting 08/30/2013, Until Discontinued, Normal    !! predniSONE (DELTASONE) 20 MG tablet Take 3 daily for 5 days, 2 daily for 5 days, 1 daily for 5 days., Normal     !! - Potential duplicate medications found. Please discuss with provider.      2.  Patient Education/Counseling:  The patient was given appropriate handouts, self care instructions, and instructed in symptomatic relief.  Continue to use all of his inhalers including albuterol, Spiriva, and Symbicort. Followup with Dr. Heath Gold within the next week.  3.  Follow up:  The patient was told to follow up here if no better in 2 days, or sooner if becoming worse in any way, and given some red flag symptoms such as increasing difficulty breathing which would prompt immediate return.         Harden Mo, MD 08/30/13 [phone]

## 2013-08-30 NOTE — ED Notes (Signed)
States feeling MUCH better.

## 2013-08-30 NOTE — ED Notes (Addendum)
Breathing treatment continues with Sao)2 @ 100% with treatment.

## 2014-07-27 ENCOUNTER — Encounter (HOSPITAL_COMMUNITY): Payer: Self-pay | Admitting: *Deleted

## 2014-07-27 ENCOUNTER — Emergency Department (HOSPITAL_COMMUNITY)
Admission: EM | Admit: 2014-07-27 | Discharge: 2014-07-27 | Disposition: A | Discharge status: Home / Self Care | Payer: 59 | Attending: Emergency Medicine | Admitting: Emergency Medicine

## 2014-07-27 ENCOUNTER — Emergency Department (HOSPITAL_COMMUNITY): Payer: 59

## 2014-07-27 DIAGNOSIS — Z79899 Other long term (current) drug therapy: Secondary | ICD-10-CM | POA: Insufficient documentation

## 2014-07-27 DIAGNOSIS — Z720 Tobacco use: Secondary | ICD-10-CM | POA: Insufficient documentation

## 2014-07-27 DIAGNOSIS — K219 Gastro-esophageal reflux disease without esophagitis: Secondary | ICD-10-CM | POA: Insufficient documentation

## 2014-07-27 DIAGNOSIS — Z7951 Long term (current) use of inhaled steroids: Secondary | ICD-10-CM | POA: Diagnosis not present

## 2014-07-27 DIAGNOSIS — J441 Chronic obstructive pulmonary disease with (acute) exacerbation: Secondary | ICD-10-CM | POA: Insufficient documentation

## 2014-07-27 DIAGNOSIS — Z8739 Personal history of other diseases of the musculoskeletal system and connective tissue: Secondary | ICD-10-CM | POA: Diagnosis not present

## 2014-07-27 DIAGNOSIS — Z880 Allergy status to penicillin: Secondary | ICD-10-CM | POA: Diagnosis not present

## 2014-07-27 DIAGNOSIS — F1210 Cannabis abuse, uncomplicated: Secondary | ICD-10-CM | POA: Diagnosis not present

## 2014-07-27 DIAGNOSIS — R042 Hemoptysis: Secondary | ICD-10-CM | POA: Diagnosis present

## 2014-07-27 DIAGNOSIS — Z7952 Long term (current) use of systemic steroids: Secondary | ICD-10-CM | POA: Insufficient documentation

## 2014-07-27 LAB — CBC WITH DIFFERENTIAL/PLATELET
BASOS ABS: 0 10*3/uL (ref 0.0–0.1)
Basophils Relative: 1 % (ref 0–1)
EOS PCT: 10 % — AB (ref 0–5)
Eosinophils Absolute: 0.9 10*3/uL — ABNORMAL HIGH (ref 0.0–0.7)
HEMATOCRIT: 47.6 % (ref 39.0–52.0)
Hemoglobin: 16.3 g/dL (ref 13.0–17.0)
LYMPHS ABS: 4.1 10*3/uL — AB (ref 0.7–4.0)
LYMPHS PCT: 48 % — AB (ref 12–46)
MCH: 29.6 pg (ref 26.0–34.0)
MCHC: 34.2 g/dL (ref 30.0–36.0)
MCV: 86.4 fL (ref 78.0–100.0)
Monocytes Absolute: 0.4 10*3/uL (ref 0.1–1.0)
Monocytes Relative: 5 % (ref 3–12)
Neutro Abs: 3 10*3/uL (ref 1.7–7.7)
Neutrophils Relative %: 36 % — ABNORMAL LOW (ref 43–77)
Platelets: 200 10*3/uL (ref 150–400)
RBC: 5.51 MIL/uL (ref 4.22–5.81)
RDW: 14.9 % (ref 11.5–15.5)
WBC: 8.5 10*3/uL (ref 4.0–10.5)

## 2014-07-27 LAB — RAPID URINE DRUG SCREEN, HOSP PERFORMED
Amphetamines: NOT DETECTED
Barbiturates: NOT DETECTED
Benzodiazepines: NOT DETECTED
Cocaine: NOT DETECTED
Opiates: NOT DETECTED
TETRAHYDROCANNABINOL: POSITIVE — AB

## 2014-07-27 LAB — I-STAT CHEM 8, ED
BUN: 12 mg/dL (ref 6–20)
CALCIUM ION: 1.21 mmol/L (ref 1.13–1.30)
CHLORIDE: 103 mmol/L (ref 101–111)
CREATININE: 1.1 mg/dL (ref 0.61–1.24)
GLUCOSE: 105 mg/dL — AB (ref 65–99)
HEMATOCRIT: 52 % (ref 39.0–52.0)
Hemoglobin: 17.7 g/dL — ABNORMAL HIGH (ref 13.0–17.0)
POTASSIUM: 3.6 mmol/L (ref 3.5–5.1)
Sodium: 142 mmol/L (ref 135–145)
TCO2: 23 mmol/L (ref 0–100)

## 2014-07-27 LAB — COMPREHENSIVE METABOLIC PANEL
ALT: 21 U/L (ref 17–63)
AST: 18 U/L (ref 15–41)
Albumin: 3.9 g/dL (ref 3.5–5.0)
Alkaline Phosphatase: 96 U/L (ref 38–126)
Anion gap: 6 (ref 5–15)
BILIRUBIN TOTAL: 0.8 mg/dL (ref 0.3–1.2)
BUN: 12 mg/dL (ref 6–20)
CALCIUM: 9.6 mg/dL (ref 8.9–10.3)
CO2: 24 mmol/L (ref 22–32)
Chloride: 108 mmol/L (ref 101–111)
Creatinine, Ser: 1.12 mg/dL (ref 0.61–1.24)
Glucose, Bld: 100 mg/dL — ABNORMAL HIGH (ref 65–99)
Potassium: 3.7 mmol/L (ref 3.5–5.1)
Sodium: 138 mmol/L (ref 135–145)
TOTAL PROTEIN: 7.2 g/dL (ref 6.5–8.1)

## 2014-07-27 LAB — BRAIN NATRIURETIC PEPTIDE: B NATRIURETIC PEPTIDE 5: 6.6 pg/mL (ref 0.0–100.0)

## 2014-07-27 LAB — I-STAT TROPONIN, ED: Troponin i, poc: 0 ng/mL (ref 0.00–0.08)

## 2014-07-27 MED ORDER — LEVOFLOXACIN 750 MG PO TABS: 750.0000 mg | ORAL_TABLET | Freq: Every day | ORAL | Status: AC

## 2014-07-27 MED ORDER — IPRATROPIUM-ALBUTEROL 0.5-2.5 (3) MG/3ML IN SOLN
3.0000 mL | Freq: Once | RESPIRATORY_TRACT | Status: AC
  Administered 2014-07-27: 3 mL via RESPIRATORY_TRACT
  Filled 2014-07-27: qty 3

## 2014-07-27 MED ORDER — PREDNISONE 20 MG PO TABS
60.0000 mg | ORAL_TABLET | Freq: Once | ORAL | Status: AC
  Administered 2014-07-27: 60 mg via ORAL
  Filled 2014-07-27: qty 3

## 2014-07-27 MED ORDER — IOHEXOL 350 MG/ML SOLN
80.0000 mL | Freq: Once | INTRAVENOUS | Status: AC | PRN
  Administered 2014-07-27: 80 mL via INTRAVENOUS

## 2014-07-27 MED ORDER — IOHEXOL 350 MG/ML SOLN
100.0000 mL | Freq: Once | INTRAVENOUS | Status: AC | PRN
  Administered 2014-07-27: 100 mL via INTRAVENOUS

## 2014-07-27 MED ORDER — SODIUM CHLORIDE 0.9 % IV SOLN
INTRAVENOUS | Status: DC
  Administered 2014-07-27: 15:00:00 via INTRAVENOUS

## 2014-07-27 MED ORDER — LEVOFLOXACIN 750 MG PO TABS
750.0000 mg | ORAL_TABLET | Freq: Once | ORAL | Status: AC
  Administered 2014-07-27: 750 mg via ORAL
  Filled 2014-07-27: qty 1

## 2014-07-27 MED ORDER — LEVOFLOXACIN 750 MG PO TABS: 750.0000 mg | ORAL_TABLET | Freq: Every day | ORAL | Status: DC

## 2014-07-27 NOTE — Discharge Instructions (Signed)

## 2014-07-27 NOTE — ED Notes (Signed)
Pt in c/o cough and congestion, states he is coughing up blood, this started today, pt recently tx for the flu, no distress noted

## 2014-07-27 NOTE — ED Notes (Signed)
MD at bedside. 

## 2014-07-27 NOTE — ED Notes (Signed)
Expiratory wheezing noted all lung fields. Respirations unlabored. Pt reports SOB increases on exertion. Denies chest pain. Pt is alert and oriented x4.

## 2014-07-27 NOTE — ED Provider Notes (Signed)
Physical Exam  BP 141/93 mmHg  Pulse 116  Temp(Src) 97.4 F (36.3 C) (Oral)  Resp 18  SpO2 94%  Physical Exam  Results for orders placed or performed during the hospital encounter of 07/27/14  CBC with Differential  Result Value Ref Range   WBC 8.5 4.0 - 10.5 K/uL   RBC 5.51 4.22 - 5.81 MIL/uL   Hemoglobin 16.3 13.0 - 17.0 g/dL   HCT 47.6 39.0 - 52.0 %   MCV 86.4 78.0 - 100.0 fL   MCH 29.6 26.0 - 34.0 pg   MCHC 34.2 30.0 - 36.0 g/dL   RDW 14.9 11.5 - 15.5 %   Platelets 200 150 - 400 K/uL   Neutrophils Relative % 36 (L) 43 - 77 %   Neutro Abs 3.0 1.7 - 7.7 K/uL   Lymphocytes Relative 48 (H) 12 - 46 %   Lymphs Abs 4.1 (H) 0.7 - 4.0 K/uL   Monocytes Relative 5 3 - 12 %   Monocytes Absolute 0.4 0.1 - 1.0 K/uL   Eosinophils Relative 10 (H) 0 - 5 %   Eosinophils Absolute 0.9 (H) 0.0 - 0.7 K/uL   Basophils Relative 1 0 - 1 %   Basophils Absolute 0.0 0.0 - 0.1 K/uL  Comprehensive metabolic panel  Result Value Ref Range   Sodium 138 135 - 145 mmol/L   Potassium 3.7 3.5 - 5.1 mmol/L   Chloride 108 101 - 111 mmol/L   CO2 24 22 - 32 mmol/L   Glucose, Bld 100 (H) 65 - 99 mg/dL   BUN 12 6 - 20 mg/dL   Creatinine, Ser 1.12 0.61 - 1.24 mg/dL   Calcium 9.6 8.9 - 10.3 mg/dL   Total Protein 7.2 6.5 - 8.1 g/dL   Albumin 3.9 3.5 - 5.0 g/dL   AST 18 15 - 41 U/L   ALT 21 17 - 63 U/L   Alkaline Phosphatase 96 38 - 126 U/L   Total Bilirubin 0.8 0.3 - 1.2 mg/dL   GFR calc non Af Amer >60 >60 mL/min   GFR calc Af Amer >60 >60 mL/min   Anion gap 6 5 - 15  Drug screen panel, emergency  Result Value Ref Range   Opiates NONE DETECTED NONE DETECTED   Cocaine NONE DETECTED NONE DETECTED   Benzodiazepines NONE DETECTED NONE DETECTED   Amphetamines NONE DETECTED NONE DETECTED   Tetrahydrocannabinol POSITIVE (A) NONE DETECTED   Barbiturates NONE DETECTED NONE DETECTED  Brain natriuretic peptide  Result Value Ref Range   B Natriuretic Peptide 6.6 0.0 - 100.0 pg/mL  I-Stat Chem 8, ED   Result Value Ref Range   Sodium 142 135 - 145 mmol/L   Potassium 3.6 3.5 - 5.1 mmol/L   Chloride 103 101 - 111 mmol/L   BUN 12 6 - 20 mg/dL   Creatinine, Ser 1.10 0.61 - 1.24 mg/dL   Glucose, Bld 105 (H) 65 - 99 mg/dL   Calcium, Ion 1.21 1.13 - 1.30 mmol/L   TCO2 23 0 - 100 mmol/L   Hemoglobin 17.7 (H) 13.0 - 17.0 g/dL   HCT 52.0 39.0 - 52.0 %  I-stat troponin, ED  Result Value Ref Range   Troponin i, poc 0.00 0.00 - 0.08 ng/mL   Comment 3           Dg Chest 2 View  07/27/2014   CLINICAL DATA:  Hemoptysis for 2 weeks.  EXAM: CHEST  2 VIEW  COMPARISON:  PA and lateral chest 12/05/2011 and 08/30/2013.  FINDINGS: Changes of bullous emphysema are again seen. No consolidative process, pneumothorax or effusion is identified.  IMPRESSION: No active cardiopulmonary disease.   Electronically Signed   By: Inge Rise M.D.   On: 07/27/2014 13:52   Ct Angio Chest Pe W/cm &/or Wo Cm  07/27/2014   CLINICAL DATA:  Patient reports hemoptysis as well wheezing. History of COPD. No chest pain.  EXAM: CT ANGIOGRAPHY CHEST WITH CONTRAST  TECHNIQUE: Multidetector CT imaging of the chest was performed using the standard protocol during bolus administration of intravenous contrast. Multiplanar CT image reconstructions and MIPs were obtained to evaluate the vascular anatomy.  CONTRAST:  179mL OMNIPAQUE IOHEXOL 350 MG/ML SOLN  COMPARISON:  Current chest radiograph.  Chest CT, 09/28/2011.  FINDINGS: Angiographic study: No evidence of a pulmonary embolism. Great vessels normal in caliber. No atherosclerotic plaque.  Thoracic inlet:  No neck base masses or adenopathy.  Normal thyroid.  Heart and mediastinum: Heart normal in size and configuration. Minimal coronary artery calcifications. Several mildly prominent lymph nodes, the largest a right subcarinal node measuring 11 mm in short axis. There mildly prominent hilar lymph nodes, largest on the right is along the superior hilum measuring 12 mm short axis. Largest on  the left is adjacent to the lower lobe bronchus measuring 9 mm in short axis.  Lungs and pleural: Extensive chronic changes. There is centrilobular and paraseptal emphysema. A large bleb is at the left apex. There is a loculated fluid collection along the antral lateral aspect of the right upper lobe measuring 7 cm in greatest transverse dimension by 19 mm in greatest thickness. This was present on the prior CT. There are areas of irregular mild bronchiectasis and bronchial wall thickening in both lower lobes and at the bases of the right middle lobe and left upper lobe lingula. This is also chronic. Superimposed acute bronchitis is possible. There are no areas of lung consolidation to suggest pneumonia. There are no discrete masses or suspicious nodules. No free pleural fluid.  Limited upper abdomen: Small hiatal hernia. No other significant finding.  Musculoskeletal: No osteoblastic or osteolytic lesions. Minor degenerative changes of the thoracic spine.  Review of the MIP images confirms the above findings.  IMPRESSION: 1. No evidence of a pulmonary embolus. 2. No definite acute findings. 3. Small chronic peripheral fluid collection adjacent to the antral lateral right upper lobe stable from the prior CT. 4. Areas of chronic mild bronchiectasis and bronchial wall thickening most evident in the lower lobes. Superimposed acute bronchitis is possible. 5. Stable changes of fairly extensive centrilobular and paraseptal emphysema. 6. No pneumonia.  No evidence of malignancy.   Electronically Signed   By: Lajean Manes M.D.   On: 07/27/2014 18:28      ED Course  Procedures  MDM  Peter Sims is a 64 y.o. male with PMH significant for severe b/l bullous emphysema complaining of dry cough which he's had for greater than one week turning productive over the last day initially with blood streaking, patient states he coughed up.   Care assumed from [facility], PA-C at 3:30 PM. At time of sign out,  the patient is awaiting his diagnostic laboratory and imaging workup including CT scan. The patient was tachycardic at time of sign out and has received disturbance, and a breathing treatment. The patient reports that his breathing feels somewhat better per report. Next  The plan of care is to evaluate the imaging studies.   The patient's diagnostic imaging study results are  seen above. The patient was found to not have any evidence of pulmonary embolism. The scan revealed stable changes of extensive emphysema and chronic bronchiectasis. There is also a small chronic fluid collection stable from prior.  Given the patient's shortness of breath and concern for COPD exacerbation, the patient was given a dose of levofloxacin and will be discharged with a prescription for the same. The patient was instructed to follow-up with his PCP in the next several days and return to the emergency department if his symptoms worsened. The patient's lungs had improved aeration and decreased wheezing when reassessed prior to discharge.  The patient had no other questions, concerns, or complaints and was discharged in good condition with understanding the plan of care.  This patient was seen with Dr. Zenia Resides, emergency medicine attending.  Antony Blackbird, MD 07/28/14 [phone]

## 2014-07-27 NOTE — ED Notes (Signed)
Pt dc home, instructions given. Pt refuses wheelchair.  Pt and family member assisted to the lobby room.

## 2014-07-27 NOTE — ED Provider Notes (Signed)
CSN: [id number]     Arrival date & time 07/27/14  1321 History   First MD Initiated Contact with Patient 07/27/14 1409     Chief Complaint  Patient presents with  . Hemoptysis     (Consider location/radiation/quality/duration/timing/severity/associated sxs/prior Treatment) HPI   Peter Sims is a 64 y.o. male with PMH significant for severe b/l bullous emphysema complaining of dry cough which she's had for greater than one week turning productive over the last day initially with blood streaking, patient states he coughed up. Blood earlier and this is largely resolved. He has been having shortness of breath and wheezing, it is not relieved by his nebulizer and inhalers at home. Patient denies fever, chills, chest pain. States he was treated for influenza 2 days ago. He denies any history of DVT or PE, recent mobilizations, calf pain or leg swelling.  [facility]  Past Medical History  Diagnosis Date  . Cocaine abuse   . GERD (gastroesophageal reflux disease)   . Bullous emphysema     Severe Bilateral  . Lung mass     Right Upper Lobe pleural based mass, negative  on PET Scan  . COPD (chronic obstructive pulmonary disease)   . Lumbago   . Tobacco abuse   . Therapeutic drug monitoring   . Colitis     with bleeding  . LBP (low back pain)     now chronic  . Neck pain     nerve pain   . Spine pain     nerve   History reviewed. No pertinent past surgical history. Family History  Problem Relation Age of Onset  . Cancer Father   . Cervical cancer Sister   . Emphysema Mother    History  Substance Use Topics  . Smoking status: Current Every Day Smoker    Types: Cigarettes  . Smokeless tobacco: Never Used     Comment: 5 cigs daily  . Alcohol Use: Yes    Review of Systems  10 systems reviewed and found to be negative, except as noted in the HPI.  Allergies  Penicillins  Home Medications   Prior to Admission medications   Medication Sig Start Date  End Date Taking? Authorizing Provider  albuterol (PROVENTIL HFA;VENTOLIN HFA) 108 (90 BASE) MCG/ACT inhaler Inhale 2 puffs into the lungs every 6 (six) hours as needed. Shortness of breath    Historical Provider, MD  atorvastatin (LIPITOR) 40 MG tablet Take 40 mg by mouth daily.    Historical Provider, MD  buPROPion (WELLBUTRIN XL) 150 MG 24 hr tablet Take 1 tablet by mouth daily. 03/02/11   Historical Provider, MD  cyclobenzaprine (FLEXERIL) 10 MG tablet Take 10 mg by mouth 3 (three) times daily as needed.    Historical Provider, MD  doxycycline (VIBRA-TABS) 100 MG tablet Take 1 tablet (100 mg total) by mouth 2 (two) times daily. 08/30/13   Harden Mo, MD  ipratropium-albuterol (DUONEB) 0.5-2.5 (3) MG/3ML SOLN Take 3 mLs by nebulization every 4 (four) hours as needed (shortness of breath and wheezing, coughing). 09/30/11   Clanford Marisa Hua, MD  lisinopril (PRINIVIL,ZESTRIL) 10 MG tablet Take 1 tablet (10 mg total) by mouth daily. 09/30/11 09/29/12  Clanford Marisa Hua, MD  LISINOPRIL PO Take by mouth.    Historical Provider, MD  losartan (COZAAR) 50 MG tablet Take 50 mg by mouth daily.    Historical Provider, MD  methocarbamol (ROBAXIN) 750 MG tablet Take 750 mg by mouth 3 (three) times daily. Muscle spasms  Historical Provider, MD  mometasone-formoterol (DULERA) 100-5 MCG/ACT AERO Inhale 2 puffs into the lungs 2 (two) times daily.      Historical Provider, MD  omeprazole (PRILOSEC) 40 MG capsule TAKE 1 CAPSULE BY MOUTH DAILY 12/15/11   Elsie Stain, MD  predniSONE (DELTASONE) 20 MG tablet Take 4 tablets by mouth once daily for 3 days, then take 3 tablets by mouth once daily for 3 days, then take 2 tablets by mouth once daily for 5 days, then take 1 tablet by mouth once daily for 4 days 09/30/11   Clanford Marisa Hua, MD  predniSONE (DELTASONE) 20 MG tablet Take 3 daily for 5 days, 2 daily for 5 days, 1 daily for 5 days. 08/30/13   Harden Mo, MD  tiotropium (SPIRIVA HANDIHALER) 18 MCG inhalation  capsule Place 1 capsule (18 mcg total) into inhaler and inhale daily. 03/07/11 03/06/12  Elsie Stain, MD  Tiotropium Bromide Monohydrate (SPIRIVA HANDIHALER IN) Inhale into the lungs.    Historical Provider, MD   BP 141/93 mmHg  Pulse 116  Temp(Src) 97.4 F (36.3 C) (Oral)  Resp 18  SpO2 94% Physical Exam  Constitutional: He is oriented to person, place, and time. He appears well-developed and well-nourished. No distress.  HENT:  Head: Normocephalic and atraumatic.  Mouth/Throat: Oropharynx is clear and moist.  Eyes: Conjunctivae and EOM are normal. Pupils are equal, round, and reactive to light.  Cardiovascular: Normal rate and regular rhythm.   Pulmonary/Chest: Effort normal. No stridor. No respiratory distress. He has wheezes. He has no rales. He exhibits no tenderness.  Reclining, speaking in short sentences. Diffuse scattered moderate expiratory wheezing. No stridor.   Blood streaked sputum noted in emesis bag.  Abdominal: Soft. He exhibits no distension and no mass. There is no tenderness. There is no rebound and no guarding.  Musculoskeletal: Normal range of motion.  Neurological: He is alert and oriented to person, place, and time.  Psychiatric: He has a normal mood and affect.  Nursing note and vitals reviewed.   ED Course  Procedures (including critical care time) Labs Review Labs Reviewed  CBC WITH DIFFERENTIAL/PLATELET - Abnormal; Notable for the following:    Neutrophils Relative % 36 (*)    Lymphocytes Relative 48 (*)    Lymphs Abs 4.1 (*)    Eosinophils Relative 10 (*)    Eosinophils Absolute 0.9 (*)    All other components within normal limits  COMPREHENSIVE METABOLIC PANEL - Abnormal; Notable for the following:    Glucose, Bld 100 (*)    All other components within normal limits  URINE RAPID DRUG SCREEN (HOSP PERFORMED) NOT AT Rule PEPTIDE  I-STAT CHEM 8, ED  Peter Sims, ED    Imaging Review Dg Chest 2 View  07/27/2014    CLINICAL DATA:  Hemoptysis for 2 weeks.  EXAM: CHEST  2 VIEW  COMPARISON:  PA and lateral chest 12/05/2011 and 08/30/2013.  FINDINGS: Changes of bullous emphysema are again seen. No consolidative process, pneumothorax or effusion is identified.  IMPRESSION: No active cardiopulmonary disease.   Electronically Signed   By: Inge Rise M.D.   On: 07/27/2014 13:52     EKG Interpretation   Date/Time:  Monday Jul 27 2014 14:19:29 EDT Ventricular Rate:  90 PR Interval:  132 QRS Duration: 83 QT Interval:  361 QTC Calculation: 442 R Axis:   83 Text Interpretation:  Sinus rhythm Borderline right axis deviation  Baseline wander in lead(s) V3 No significant change since  last tracing  Confirmed by [facility]  MD, WHITNEY ([id number]) on 07/27/2014 2:40:07 PM      MDM   Final diagnoses:  None    Filed Vitals:   07/27/14 1326  BP: 141/93  Pulse: 116  Temp: 97.4 F (36.3 C)  TempSrc: Oral  Resp: 18  SpO2: 94%    Medications  0.9 %  sodium chloride infusion ( Intravenous New Bag/Given 07/27/14 1431)  ipratropium-albuterol (DUONEB) 0.5-2.5 (3) MG/3ML nebulizer solution 3 mL (not administered)  ipratropium-albuterol (DUONEB) 0.5-2.5 (3) MG/3ML nebulizer solution 3 mL (3 mLs Nebulization Given 07/27/14 1426)  predniSONE (DELTASONE) tablet 60 mg (60 mg Oral Given 07/27/14 1426)    Peter Sims is a pleasant 64 y.o. male presenting with wheezing, shortness of breath and hemoptysis. Patient is tachycardic, no clinical signs of DVT. Patient has severe, bullous, emphysema. This is likely a emphysema exacerbation however considering his tachycardia CT to rule out PE is ordered. Urine drug screen is also ordered as patient has a history of cocaine abuse.  Reassessed after first nebulizer treatment with improvement in wheezing however he is still having mild to moderate wheezing in all fields, will order second neb.  Case signed out to resident Dr. Sherry Ruffing at shift change. Plan is to f/u CT and  reassess.        Monico Blitz, PA-C 07/27/14 [place], MD 07/29/14 2154

## 2015-03-01 DIAGNOSIS — R0602 Shortness of breath: Secondary | ICD-10-CM | POA: Diagnosis not present

## 2015-03-01 DIAGNOSIS — R05 Cough: Secondary | ICD-10-CM | POA: Diagnosis not present

## 2015-03-01 DIAGNOSIS — J449 Chronic obstructive pulmonary disease, unspecified: Secondary | ICD-10-CM | POA: Diagnosis not present

## 2015-03-05 MED FILL — IPRAT-ALBUT 0.5-3(2.5) MG/3: 0.5-2.5 (3) | 30 days supply | Qty: 360 | Fill #0

## 2015-03-25 DIAGNOSIS — Z716 Tobacco abuse counseling: Secondary | ICD-10-CM | POA: Diagnosis not present

## 2015-03-25 DIAGNOSIS — I10 Essential (primary) hypertension: Secondary | ICD-10-CM | POA: Diagnosis not present

## 2015-03-25 DIAGNOSIS — Z9981 Dependence on supplemental oxygen: Secondary | ICD-10-CM | POA: Diagnosis not present

## 2015-03-25 DIAGNOSIS — Z720 Tobacco use: Secondary | ICD-10-CM | POA: Diagnosis not present

## 2015-03-25 DIAGNOSIS — Z1211 Encounter for screening for malignant neoplasm of colon: Secondary | ICD-10-CM | POA: Diagnosis not present

## 2015-03-25 DIAGNOSIS — F329 Major depressive disorder, single episode, unspecified: Secondary | ICD-10-CM | POA: Diagnosis not present

## 2015-03-25 DIAGNOSIS — Z0000 Encounter for general adult medical examination without abnormal findings: Secondary | ICD-10-CM | POA: Diagnosis not present

## 2015-03-25 DIAGNOSIS — Z79899 Other long term (current) drug therapy: Secondary | ICD-10-CM | POA: Diagnosis not present

## 2015-03-25 DIAGNOSIS — J441 Chronic obstructive pulmonary disease with (acute) exacerbation: Secondary | ICD-10-CM | POA: Diagnosis not present

## 2015-04-01 DIAGNOSIS — R05 Cough: Secondary | ICD-10-CM | POA: Diagnosis not present

## 2015-04-01 DIAGNOSIS — J449 Chronic obstructive pulmonary disease, unspecified: Secondary | ICD-10-CM | POA: Diagnosis not present

## 2015-04-01 DIAGNOSIS — R0602 Shortness of breath: Secondary | ICD-10-CM | POA: Diagnosis not present

## 2015-04-02 MED FILL — OMEPRAZOLE DR 40 MG CAPSULE: 40 | 30 days supply | Qty: 30 | Fill #4

## 2015-04-02 MED FILL — SYMBICORT 160-4.5 MCG INH: 160-4.5 | 30 days supply | Qty: 10 | Fill #2

## 2015-04-02 MED FILL — DICLOFENAC SOD EC 75 MG TAB: 75 | 30 days supply | Qty: 60 | Fill #1

## 2015-04-02 MED FILL — BUPROPION HCL XL 150 MG TAB: 150 | 90 days supply | Qty: 90 | Fill #1

## 2015-04-05 MED FILL — predniSONE 10 MG (21) TBPK: 10 | 6 days supply | Qty: 21 | Fill #0

## 2015-04-22 MED FILL — CLINDAMYCIN HCL 300 MG CAP: 300 | 7 days supply | Qty: 28 | Fill #0

## 2015-06-08 MED FILL — SYMBICORT 160-4.5 MCG INH: 160-4.5 | 30 days supply | Qty: 10 | Fill #3

## 2015-06-12 ENCOUNTER — Encounter (HOSPITAL_COMMUNITY): Payer: Self-pay | Admitting: Emergency Medicine

## 2015-06-12 ENCOUNTER — Emergency Department (HOSPITAL_COMMUNITY)
Admission: EM | Admit: 2015-06-12 | Discharge: 2015-06-12 | Disposition: A | Discharge status: Home / Self Care | Payer: 59 | Attending: Emergency Medicine | Admitting: Emergency Medicine

## 2015-06-12 ENCOUNTER — Emergency Department (HOSPITAL_COMMUNITY): Payer: 59

## 2015-06-12 DIAGNOSIS — R079 Chest pain, unspecified: Secondary | ICD-10-CM | POA: Diagnosis present

## 2015-06-12 DIAGNOSIS — Z791 Long term (current) use of non-steroidal anti-inflammatories (NSAID): Secondary | ICD-10-CM | POA: Insufficient documentation

## 2015-06-12 DIAGNOSIS — F17210 Nicotine dependence, cigarettes, uncomplicated: Secondary | ICD-10-CM | POA: Diagnosis not present

## 2015-06-12 DIAGNOSIS — K219 Gastro-esophageal reflux disease without esophagitis: Secondary | ICD-10-CM | POA: Insufficient documentation

## 2015-06-12 DIAGNOSIS — J449 Chronic obstructive pulmonary disease, unspecified: Secondary | ICD-10-CM | POA: Diagnosis not present

## 2015-06-12 DIAGNOSIS — Z880 Allergy status to penicillin: Secondary | ICD-10-CM | POA: Diagnosis not present

## 2015-06-12 DIAGNOSIS — R0789 Other chest pain: Secondary | ICD-10-CM | POA: Insufficient documentation

## 2015-06-12 DIAGNOSIS — Z792 Long term (current) use of antibiotics: Secondary | ICD-10-CM | POA: Insufficient documentation

## 2015-06-12 DIAGNOSIS — G8929 Other chronic pain: Secondary | ICD-10-CM | POA: Insufficient documentation

## 2015-06-12 DIAGNOSIS — R0602 Shortness of breath: Secondary | ICD-10-CM | POA: Diagnosis not present

## 2015-06-12 DIAGNOSIS — Z79899 Other long term (current) drug therapy: Secondary | ICD-10-CM | POA: Diagnosis not present

## 2015-06-12 LAB — BASIC METABOLIC PANEL
ANION GAP: 11 (ref 5–15)
BUN: 18 mg/dL (ref 6–20)
CO2: 21 mmol/L — ABNORMAL LOW (ref 22–32)
Calcium: 9.3 mg/dL (ref 8.9–10.3)
Chloride: 110 mmol/L (ref 101–111)
Creatinine, Ser: 1.18 mg/dL (ref 0.61–1.24)
GFR calc Af Amer: >60 mL/min (ref >60)
GLUCOSE: 93 mg/dL (ref 65–99)
POTASSIUM: 4.1 mmol/L (ref 3.5–5.1)
Sodium: 142 mmol/L (ref 135–145)

## 2015-06-12 LAB — CBC
HEMATOCRIT: 44.5 % (ref 39.0–52.0)
HEMOGLOBIN: 14.9 g/dL (ref 13.0–17.0)
MCH: 29 pg (ref 26.0–34.0)
MCHC: 33.5 g/dL (ref 30.0–36.0)
MCV: 86.6 fL (ref 78.0–100.0)
Platelets: 215 10*3/uL (ref 150–400)
RBC: 5.14 MIL/uL (ref 4.22–5.81)
RDW: 14.9 % (ref 11.5–15.5)
WBC: 7.6 10*3/uL (ref 4.0–10.5)

## 2015-06-12 LAB — I-STAT TROPONIN, ED: Troponin i, poc: 0 ng/mL (ref 0.00–0.08)

## 2015-06-12 NOTE — ED Notes (Signed)
Pt verbalized understanding of d/c instructions and is to follow up with pcp, no further questions. NAD.

## 2015-06-12 NOTE — Discharge Instructions (Signed)
Use heat on the sore area 3 or 4 times a day. Follow-up with your Dr. for problems.   Chest Wall Pain Chest wall pain is pain in or around the bones and muscles of your chest. Sometimes, an injury causes this pain. Sometimes, the cause may not be known. This pain may take several weeks or longer to get better. HOME CARE INSTRUCTIONS  Pay attention to any changes in your symptoms. Take these actions to help with your pain:   Rest as told by your health care provider.   Avoid activities that cause pain. These include any activities that use your chest muscles or your abdominal and side muscles to lift heavy items.   If directed, apply ice to the painful area:  Put ice in a plastic bag.  Place a towel between your skin and the bag.  Leave the ice on for 20 minutes, 2-3 times per day.  Take over-the-counter and prescription medicines only as told by your health care provider.  Do not use tobacco products, including cigarettes, chewing tobacco, and e-cigarettes. If you need help quitting, ask your health care provider.  Keep all follow-up visits as told by your health care provider. This is important. SEEK MEDICAL CARE IF:  You have a fever.  Your chest pain becomes worse.  You have new symptoms. SEEK IMMEDIATE MEDICAL CARE IF:  You have nausea or vomiting.  You feel sweaty or light-headed.  You have a cough with phlegm (sputum) or you cough up blood.  You develop shortness of breath.   This information is not intended to replace advice given to you by your health care provider. Make sure you discuss any questions you have with your health care provider.   Document Released: 02/13/2005 Document Revised: 11/04/2014 Document Reviewed: 05/11/2014 Elsevier Interactive Patient Education [employer].

## 2015-06-12 NOTE — ED Notes (Signed)
Pt. reports left chest pain " spasms" with miild SOB onset this evening , pain radiates to left upper back , denies nausea or diaphoresis .

## 2015-06-12 NOTE — ED Provider Notes (Signed)
CSN: [id number]     Arrival date & time 06/12/15  2133 History   First MD Initiated Contact with Patient 06/12/15 2233     Chief Complaint  Patient presents with  . Chest Pain     (Consider location/radiation/quality/duration/timing/severity/associated sxs/prior Treatment) HPI   Peter Sims is a 65 y.o. male presents for evaluation of left-sided upper lateral chest pain which occurred tonight while he was laughing with friends, essentially drinking cold beverages. He has had intermittent similar episodes of "spasm, for 1 week. Has ongoing low back pain with "spasms," for which he takes Flexeril, and Voltaren. He denies cough, diaphoresis, nausea, vomiting, weakness or dizziness. There are no other known modifying factors.   Past Medical History  Diagnosis Date  . Cocaine abuse   . GERD (gastroesophageal reflux disease)   . Bullous emphysema (HCC)     Severe Bilateral  . Lung mass     Right Upper Lobe pleural based mass, negative  on PET Scan  . COPD (chronic obstructive pulmonary disease) ([place])   . Lumbago   . Tobacco abuse   . Therapeutic drug monitoring   . Colitis     with bleeding  . LBP (low back pain)     now chronic  . Neck pain     nerve pain   . Spine pain     nerve   History reviewed. No pertinent past surgical history. Family History  Problem Relation Age of Onset  . Cancer Father   . Cervical cancer Sister   . Emphysema Mother    Social History  Substance Use Topics  . Smoking status: Current Every Day Smoker    Types: Cigarettes  . Smokeless tobacco: Never Used     Comment: 5 cigs daily  . Alcohol Use: Yes    Review of Systems  All other systems reviewed and are negative.     Allergies  Penicillins  Home Medications   Prior to Admission medications   Medication Sig Start Date End Date Taking? Authorizing Provider  albuterol (PROVENTIL HFA;VENTOLIN HFA) 108 (90 BASE) MCG/ACT inhaler Inhale 2 puffs into the lungs every 6 (six) hours  as needed. Shortness of breath    Historical Provider, MD  atorvastatin (LIPITOR) 40 MG tablet Take 40 mg by mouth daily.    Historical Provider, MD  budesonide-formoterol (SYMBICORT) 160-4.5 MCG/ACT inhaler Inhale 2 puffs into the lungs 2 (two) times daily.    Historical Provider, MD  buPROPion (WELLBUTRIN XL) 150 MG 24 hr tablet Take 1 tablet by mouth daily. 03/02/11   Historical Provider, MD  diclofenac (VOLTAREN) 25 MG EC tablet Take 25 mg by mouth daily.    Historical Provider, MD  doxycycline (VIBRA-TABS) 100 MG tablet Take 1 tablet (100 mg total) by mouth 2 (two) times daily. 08/30/13   Harden Mo, MD  guaiFENesin (MUCINEX) 600 MG 12 hr tablet Take 600 mg by mouth 4 (four) times daily as needed for to loosen phlegm.    Historical Provider, MD  ipratropium-albuterol (DUONEB) 0.5-2.5 (3) MG/3ML SOLN Take 3 mLs by nebulization every 4 (four) hours as needed (shortness of breath and wheezing, coughing). 09/30/11   Clanford Marisa Hua, MD  levofloxacin (LEVAQUIN) 750 MG tablet Take 1 tablet (750 mg total) by mouth daily. 07/27/14   Antony Blackbird, MD  lisinopril (PRINIVIL,ZESTRIL) 10 MG tablet Take 1 tablet (10 mg total) by mouth daily. 09/30/11 07/27/14  Clanford Marisa Hua, MD  losartan (COZAAR) 50 MG tablet Take 50 mg by mouth  daily.    Historical Provider, MD  omeprazole (PRILOSEC) 40 MG capsule TAKE 1 CAPSULE BY MOUTH DAILY 12/15/11   Elsie Stain, MD  OXYGEN Inhale 2.5 L into the lungs 2 (two) times daily as needed (use sduring day only as needed and wears every night).    Historical Provider, MD  predniSONE (DELTASONE) 20 MG tablet Take 4 tablets by mouth once daily for 3 days, then take 3 tablets by mouth once daily for 3 days, then take 2 tablets by mouth once daily for 5 days, then take 1 tablet by mouth once daily for 4 days 09/30/11   Clanford Marisa Hua, MD  predniSONE (DELTASONE) 20 MG tablet Take 3 daily for 5 days, 2 daily for 5 days, 1 daily for 5 days. 08/30/13   Harden Mo, MD   tiotropium (SPIRIVA HANDIHALER) 18 MCG inhalation capsule Place 1 capsule (18 mcg total) into inhaler and inhale daily. 03/07/11 03/06/12  Elsie Stain, MD   BP 120/78 mmHg  Pulse 83  Temp(Src) 97.7 F (36.5 C) (Oral)  Resp 18  Ht 6\' 1"  (1.854 m)  Wt 191 lb 8 oz (86.864 kg)  BMI 25.27 kg/m2  SpO2 94% Physical Exam  Constitutional: He is oriented to person, place, and time. He appears well-developed and well-nourished.  HENT:  Head: Normocephalic and atraumatic.  Right Ear: External ear normal.  Left Ear: External ear normal.  Eyes: Conjunctivae and EOM are normal. Pupils are equal, round, and reactive to light.  Neck: Normal range of motion and phonation normal. Neck supple.  Cardiovascular: Normal rate, regular rhythm and normal heart sounds.   Pulmonary/Chest: Effort normal and breath sounds normal. He exhibits tenderness (left upper anterior and lateral chest wall, mild. No associated deformity, swelling or crepitation.). He exhibits no bony tenderness.  Abdominal: Soft. There is no tenderness.  Musculoskeletal: Normal range of motion.  Neurological: He is alert and oriented to person, place, and time. No cranial nerve deficit or sensory deficit. He exhibits normal muscle tone. Coordination normal.  Skin: Skin is warm, dry and intact.  Psychiatric: He has a normal mood and affect. His behavior is normal. Judgment and thought content normal.  Nursing note and vitals reviewed.   ED Course  Procedures (including critical care time)  Medications - No data to display  Patient Vitals for the past 24 hrs:  BP Temp Temp src Pulse Resp SpO2 Height Weight  06/12/15 2258 - - - 83 - 94 % - -  06/12/15 2257 120/78 mmHg - - 83 18 96 % - -  06/12/15 2141 130/80 mmHg 97.7 F (36.5 C) Oral 91 20 97 % 6\' 1"  (1.854 m) 191 lb 8 oz (86.864 kg)    11:08 PM Reevaluation with update and discussion. After initial assessment and treatment, an updated evaluation reveals no change in clinical  status. Findings discussed with patient, all questions were answered. [place] METABOLIC PANEL - Abnormal; Notable for the following:    CO2 21 (*)    All other components within normal limits  CBC  I-STAT TROPOININ, ED    Imaging Review Dg Chest 2 View  06/12/2015  CLINICAL DATA:  Initial evaluation for acute left-sided chest pain, shortness of breath. EXAM: CHEST  2 VIEW COMPARISON:  Prior study from 5/30 07/27/2014. FINDINGS: Cardiac and mediastinal silhouettes are stable in size and contour, and remain within normal limits. Lungs are mildly hypoinflated with bibasilar subsegmental atelectasis. Diffuse  peribronchial thickening, similar to prior. No consolidative airspace disease. Bullous emphysema at the left upper lobe. No pulmonary edema or pleural effusion. No pneumothorax. No acute osseus abnormality. IMPRESSION: 1. No active cardiopulmonary disease. 2. Emphysema. Electronically Signed   By: Jeannine Boga M.D.   On: 06/12/2015 22:57   I have personally reviewed and evaluated these images and lab results as part of my medical decision-making.   EKG Interpretation   Date/Time:  Saturday June 12 2015 21:39:08 EDT Ventricular Rate:  95 PR Interval:  126 QRS Duration: 84 QT Interval:  352 QTC Calculation: 442 R Axis:   79 Text Interpretation:  Sinus rhythm with occasional Premature ventricular  complexes Otherwise normal ECG Since last tracing pvc is new Confirmed by  Eulis Foster  MD, Prajwal Fellner [id number]) on 06/12/2015 10:34:37 PM      MDM   Final diagnoses:  Chest wall pain    Chest wall pain, likely muscle spasm. Doubt fracture, pneumonia, PE or ACS.  Nursing Notes Reviewed/ Care Coordinated Applicable Imaging Reviewed Interpretation of Laboratory Data incorporated into ED treatment  The patient appears reasonably screened and/or stabilized for discharge and I doubt any other medical condition or other Care [facility] requiring further  screening, evaluation, or treatment in the ED at this time prior to discharge.  Plan: Home Medications- usual; Home Treatments- rest; return here if the recommended treatment, does not improve the symptoms; Recommended follow up- PCP prn     Daleen Bo, MD 06/12/15 2311

## 2015-06-18 MED FILL — OMEPRAZOLE DR 40 MG CAPSULE: 40 | 30 days supply | Qty: 30 | Fill #5

## 2015-06-24 DIAGNOSIS — M62838 Other muscle spasm: Secondary | ICD-10-CM | POA: Diagnosis not present

## 2015-06-24 DIAGNOSIS — R7309 Other abnormal glucose: Secondary | ICD-10-CM | POA: Diagnosis not present

## 2015-06-24 DIAGNOSIS — I10 Essential (primary) hypertension: Secondary | ICD-10-CM | POA: Diagnosis not present

## 2015-06-24 DIAGNOSIS — J449 Chronic obstructive pulmonary disease, unspecified: Secondary | ICD-10-CM | POA: Diagnosis not present

## 2015-07-21 ENCOUNTER — Other Ambulatory Visit (HOSPITAL_COMMUNITY): Payer: Self-pay | Admitting: Physical Medicine and Rehabilitation

## 2015-07-21 DIAGNOSIS — M791 Myalgia: Secondary | ICD-10-CM | POA: Diagnosis not present

## 2015-07-21 DIAGNOSIS — M5412 Radiculopathy, cervical region: Secondary | ICD-10-CM | POA: Diagnosis not present

## 2015-07-21 DIAGNOSIS — G8929 Other chronic pain: Secondary | ICD-10-CM

## 2015-07-21 DIAGNOSIS — M542 Cervicalgia: Secondary | ICD-10-CM

## 2015-07-21 DIAGNOSIS — Z716 Tobacco abuse counseling: Secondary | ICD-10-CM | POA: Diagnosis not present

## 2015-07-21 DIAGNOSIS — M545 Low back pain, unspecified: Secondary | ICD-10-CM

## 2015-07-21 DIAGNOSIS — M5416 Radiculopathy, lumbar region: Secondary | ICD-10-CM | POA: Diagnosis not present

## 2015-07-21 DIAGNOSIS — F17210 Nicotine dependence, cigarettes, uncomplicated: Secondary | ICD-10-CM | POA: Diagnosis not present

## 2015-07-27 MED FILL — DICLOFENAC SOD EC 75 MG TAB: 75 | 30 days supply | Qty: 60 | Fill #0

## 2015-07-30 ENCOUNTER — Ambulatory Visit (HOSPITAL_COMMUNITY)
Admission: RE | Admit: 2015-07-30 | Discharge: 2015-07-30 | Disposition: A | Discharge status: Home / Self Care | Payer: 59 | Source: Ambulatory Visit | Attending: Physical Medicine and Rehabilitation | Admitting: Physical Medicine and Rehabilitation

## 2015-07-30 DIAGNOSIS — M5023 Other cervical disc displacement, cervicothoracic region: Secondary | ICD-10-CM | POA: Insufficient documentation

## 2015-07-30 DIAGNOSIS — M542 Cervicalgia: Secondary | ICD-10-CM | POA: Insufficient documentation

## 2015-07-30 DIAGNOSIS — M4806 Spinal stenosis, lumbar region: Secondary | ICD-10-CM | POA: Insufficient documentation

## 2015-07-30 DIAGNOSIS — G8929 Other chronic pain: Secondary | ICD-10-CM

## 2015-07-30 DIAGNOSIS — M47812 Spondylosis without myelopathy or radiculopathy, cervical region: Secondary | ICD-10-CM | POA: Diagnosis not present

## 2015-07-30 DIAGNOSIS — M47816 Spondylosis without myelopathy or radiculopathy, lumbar region: Secondary | ICD-10-CM | POA: Diagnosis not present

## 2015-07-30 DIAGNOSIS — M5127 Other intervertebral disc displacement, lumbosacral region: Secondary | ICD-10-CM | POA: Diagnosis not present

## 2015-07-30 DIAGNOSIS — M545 Low back pain, unspecified: Secondary | ICD-10-CM

## 2015-07-30 DIAGNOSIS — M4302 Spondylolysis, cervical region: Secondary | ICD-10-CM | POA: Insufficient documentation

## 2015-07-30 DIAGNOSIS — M7138 Other bursal cyst, other site: Secondary | ICD-10-CM | POA: Diagnosis not present

## 2015-07-30 MED FILL — SYMBICORT 160-4.5 MCG INH: 160-4.5 | 30 days supply | Qty: 10 | Fill #4

## 2015-07-30 MED FILL — OMEPRAZOLE DR 40 MG CAPSULE: 40 | 90 days supply | Qty: 90 | Fill #0

## 2015-07-30 MED FILL — diazePAM 10 MG TABS: 10 | 2 days supply | Qty: 2 | Fill #0

## 2015-08-02 DIAGNOSIS — M5416 Radiculopathy, lumbar region: Secondary | ICD-10-CM | POA: Diagnosis not present

## 2015-08-02 DIAGNOSIS — M5412 Radiculopathy, cervical region: Secondary | ICD-10-CM | POA: Diagnosis not present

## 2015-08-02 DIAGNOSIS — Z716 Tobacco abuse counseling: Secondary | ICD-10-CM | POA: Diagnosis not present

## 2015-08-04 ENCOUNTER — Encounter (HOSPITAL_COMMUNITY): Payer: Self-pay | Admitting: *Deleted

## 2015-08-04 DIAGNOSIS — F17210 Nicotine dependence, cigarettes, uncomplicated: Secondary | ICD-10-CM | POA: Diagnosis not present

## 2015-08-04 DIAGNOSIS — M549 Dorsalgia, unspecified: Secondary | ICD-10-CM | POA: Diagnosis not present

## 2015-08-04 DIAGNOSIS — J439 Emphysema, unspecified: Secondary | ICD-10-CM | POA: Insufficient documentation

## 2015-08-04 DIAGNOSIS — G8929 Other chronic pain: Secondary | ICD-10-CM | POA: Diagnosis not present

## 2015-08-04 NOTE — ED Notes (Signed)
The pt has chronic back pain and for th past 2 days his pain  Has been worse.  He was seen by his doctor and had his lower back injected. 2 days ago  But it did not help this time.  h cannot weight bear on his lt leg he was given percocet and flexeril but they have not helped

## 2015-08-04 NOTE — ED Notes (Signed)
No bowel or bladder difficulty

## 2015-08-05 ENCOUNTER — Emergency Department (HOSPITAL_COMMUNITY)
Admission: EM | Admit: 2015-08-05 | Discharge: 2015-08-05 | Disposition: A | Discharge status: Home / Self Care | Payer: 59 | Attending: Emergency Medicine | Admitting: Emergency Medicine

## 2015-08-05 MED FILL — METAXALONE 800 MG TABLET: 800 | 30 days supply | Qty: 60 | Fill #0

## 2015-08-05 MED FILL — predniSONE 10 MG TABS: 10 | 12 days supply | Qty: 48 | Fill #0

## 2015-08-05 NOTE — ED Notes (Signed)
Pt states he is leaving and will see PMD tomorrow

## 2015-08-12 ENCOUNTER — Other Ambulatory Visit: Payer: Self-pay | Admitting: *Deleted

## 2015-08-12 ENCOUNTER — Encounter: Payer: Self-pay | Admitting: *Deleted

## 2015-08-12 NOTE — Patient Outreach (Addendum)
[facility]) Care Management  08/12/2015  Peter Sims 09/25/1950 [id number]  Subjective: Telephone call to patient's wife Lynelle Smoke [place]) work number, per [facility] sign consent form and referral email request.  Spoke with wife and HIPAA verified.   States she is currently busy at work and requested that I reach out to patient.   Telephone call to patient's home number, spoke with patient, and HIPAA verified.  Patient states he is doing ok and hanging in there.   Discussed [name] Care Management services with patient, voiced understanding, and is  in agreement to receive services.  Patient states he has a history of COPD, rheumatoid arthritis (RA), hypertension, and borderline diabetes.  Patient states his last A1C was 6.4, not currently on any diabetic medications, and has made dietary changes to keep AC1 in line.  States he does not need any education on COPD.   Patient states has received disability since 2014, has Medicare part A, and is covered under his wife's [employer].      States he feels like his care is all over the place and wants to have all of his MDs in one place.   States he has received Medicaid in the past, had reimburse Medicare of overpayment, and is no longer eligible for Medicaid.  Patient states his biggest needs are obtaining local MD providers in [place], versus driving to [facility] for services, obtaining oxygen, obtaining oxygen concentrator through another durable medical equipment provider, and have care coordination of healthcare.   Patient states he has been walking crutches and straight cane for the last week due questionable nerve damage from an injection.  States his orthopedic MD, is Dr. Normajean Glasgow at [facility].   States he is wanting to change to a different orthopedic MD.   RNCM educated patient how to look up in network providers on insurance website and referral criteria.   Patient voices understanding, states he will review  list of providers on line, enlist RNCM assistance if needed with transition, and give RNCM update on next outreach call.   Patient states he has used [employer] for oxygen, oxygen concentrator, in the past, returned equipment to [facility] approximately 2 - 3 months ago due service dissatisfaction and billing dissatisfaction.   States he has been without oxygen and concentrator since equipment was returned and has been managing COPD with inhalers, medications, and nebulizer.  Patient states he wants a new durable medical equipment provider for his oxygen equipment and does not want [facility].  RNCM advised will send patient list of providers in [facility].   Patient voices understanding, will review list of providers once received, choose a provider, make contact with provider for services or enlist RNCM assistance if needed.   Patient states he would to also like change his primary MD.   States his current primary MD is Dr. Glendale Chard.   States he has seen Dr. Karlton Lemon in the past and may research going into the MD VIP program.  Sates he will review list of providers on line, enlist RNCM assistance if needed with transition, and give RNCM update on next outreach call.  Patient states he would like to change his pulmonologist to [facility] from [place] in [place].  Sates he will review list of providers on line, enlist RNCM assistance if needed with transition, and give RNCM update on next outreach call.  Patient states he has been recently diagnosed with rheumatoid arthritis by Dr. Allena Katz  Rip Harbour, does not currently have a rheumatologist,  and would like to see a rheumatologist.  Sates he will review list of providers on line, enlist RNCM assistance if needed to obtain services, and give RNCM update on next outreach call.  Patient states he has been given a prescription for outpatient physical therapy evaluation and can not afford the $50 copay.  RNCM advised patient of [facility]  employee / family $20 copay rate at [facility] and discounts are available for other services in [place] facilities. .   Patient voices understanding and states he will ask his wife to make him an appointment at the [facility] facility.   Patient states he does not have any pharmacy or transportation issues at this time.  States he does not drive but has faily members that will take him to appointments as needed.   Patient states he gets his prescription filled at [place], has a good relationship with the pharmacist, and is able to ask questions at needed. Patient states he has a history of medication overdose in the past due to forgetting that he had already taken his medications.  Patient states he now uses a pill box.   Patient will continue to receive [place] Management services.   Objective: Per chart review: Patient had ED visit on 08/04/15 - 08/05/15 for back pain.  Patient  left ED, no MD provider notes in chart and stated he would follow up with primary MD.   Patient had ED visit on 06/12/15 for chest pain.   Assessment: Received UMR member self/ spouse referral on 08/06/15.  Referral reason: Spouse states she is needing some help with husband.    Telephone screen completed.   Patient will continue to receive [facility] Care Management services for care coordination.    Plan: RNCM will send successful outreach letter, patient [facility] Welcome packet, and list durable medical equipment companies. RNCM will send request for consent to be uploaded to chart and case status changed to open, to [place].   RNCM will call patient within 3 weeks to follow up on care coordination needs (verify durable medical equipment vendor choice, verify status of oxygen supplies, new MD provider status)  and  complete initial assessment. RNCM will verify status of outpatient physical therapy within 3 weeks.  RNCM will follow up with patient within 3 weeks to verify  status of pulmonologist, rheumatologist, orthopedist, and primary MD.    Colbert Coyer. Annia Friendly, BSN, [place] Management [facility] Telephonic CM Phone: [phone] Fax: [phone]

## 2015-08-17 ENCOUNTER — Other Ambulatory Visit: Payer: Self-pay | Admitting: *Deleted

## 2015-08-17 NOTE — Patient Outreach (Signed)
[facility]) Care Management  08/17/2015  Peter Sims [birth year] [id number]  Subjective: Received voicemail message from patient's wife Lynelle Smoke [employer]), states she has some questions regarding patient's diagnosis and request call back at work number [phone]).   States she can be reached at work number until 4:00pm today.  RNCM has received signed [place] Management consent with patient's written authorization to speak with wife as needed.   Telephone call to wife's work number, left HIPAA compliant message with male answering the phone for Mrs. Yeager, and requested call back. Patient will continue to receive [place] Management  [facility] services.   Objective: Per chart review: Patient had ED visit on 08/04/15 - 08/05/15 for back pain. Patient left ED, no MD provider notes in chart and stated he would follow up with primary MD. Patient had ED visit on 06/12/15 for chest pain.   Assessment: Received UMR member self/ spouse referral on 08/06/15. Referral reason: Spouse states she is needing some help with husband. Telephone screen completed. Patient will continue to receive [facility] Care Management services for care coordination.   Plan: RNCM will call patient within 3 weeks to follow up on care coordination needs (verify durable medical equipment vendor choice, verify status of oxygen supplies, new MD provider status) and complete initial assessment. RNCM will verify status of outpatient physical therapy within 3 weeks.  RNCM will follow up with patient within 3 weeks to verify status of pulmonologist, rheumatologist, orthopedist, and primary MD.   Colbert Coyer. Annia Friendly, BSN, [place] Management [facility] Telephonic CM Phone: [phone] Fax: [phone]

## 2015-08-18 ENCOUNTER — Ambulatory Visit: Payer: 59 | Attending: Orthopedic Surgery

## 2015-08-18 DIAGNOSIS — M545 Low back pain: Secondary | ICD-10-CM | POA: Insufficient documentation

## 2015-08-18 DIAGNOSIS — R293 Abnormal posture: Secondary | ICD-10-CM | POA: Diagnosis not present

## 2015-08-18 DIAGNOSIS — R262 Difficulty in walking, not elsewhere classified: Secondary | ICD-10-CM | POA: Insufficient documentation

## 2015-08-18 DIAGNOSIS — M5441 Lumbago with sciatica, right side: Secondary | ICD-10-CM | POA: Diagnosis not present

## 2015-08-18 DIAGNOSIS — R252 Cramp and spasm: Secondary | ICD-10-CM | POA: Insufficient documentation

## 2015-08-18 DIAGNOSIS — M542 Cervicalgia: Secondary | ICD-10-CM | POA: Insufficient documentation

## 2015-08-18 NOTE — Therapy (Signed)
[place], [place], [address] Phone: [phone]   Fax:  [phone]  Physical Therapy Evaluation  Patient Details  Name: Peter Sims MRN: [id number] Date of Birth: February 07, 1951 Referring Provider: Normajean Glasgow, MD  Encounter Date: 08/18/2015      PT End of Session - 08/18/15 1232    Visit Number 1   Number of Visits 12   Date for PT Re-Evaluation 09/29/15   Authorization Type Medicare/UMR   PT Start Time 1036   PT Stop Time 1130   PT Time Calculation (min) 54 min   Activity Tolerance Patient limited by pain   Behavior During Therapy [facility] for tasks assessed/performed      Past Medical History  Diagnosis Date  . Cocaine abuse   . GERD (gastroesophageal reflux disease)   . Bullous emphysema (HCC)     Severe Bilateral  . Lung mass     Right Upper Lobe pleural based mass, negative  on PET Scan  . COPD (chronic obstructive pulmonary disease) ([place])   . Lumbago   . Tobacco abuse   . Therapeutic drug monitoring   . Colitis     with bleeding  . LBP (low back pain)     now chronic  . Neck pain     nerve pain   . Spine pain     nerve    No past surgical history on file.  There were no vitals filed for this visit.       Subjective Assessment - 08/18/15 1053    Subjective He reports onset of chronic neck and back pain from fall in 1987 Felt better then in 2010 he fell again and began to have chronic pain. Dx of OA and DDD with pinched nerves in back. 204 on disability. Reports neck and jaw locking with stretching Reports spasms.    Pertinent History COPD, He received cortisone injections May 2015   Limitations Walking;Standing   Diagnostic tests MRI:    Patient Stated Goals Releive pain , get some mobility   Currently in Pain? Yes   Pain Score 7    Pain Location Neck   Pain Orientation Right;Posterior;Left;Lower  neck   Pain Descriptors / Indicators Numbness   Pain Type Chronic pain   Pain Radiating Towards RT buttock to foot posterior RT leg numbness   Pain Onset More than a month ago   Pain Frequency Constant   Multiple Pain Sites Yes   Pain Score 10   Pain Location Back   Pain Orientation Right;Left   Pain Descriptors / Indicators Constant   Pain Type Chronic pain   Pain Onset More than a month ago   Pain Frequency Constant            [facility] PT Assessment - 08/18/15 1042    Assessment   Medical Diagnosis neck and back pain   Referring Provider Normajean Glasgow, MD   Onset Date/Surgical Date --  6 years   Next MD Visit later in July2017   Prior Therapy Yes years ago . Therapy did not make a big difference   Precautions   Precautions None   Restrictions   Weight Bearing Restrictions No   Balance Screen   Has the patient fallen in the past 6 months No   Has the patient had a decrease in activity level because of a fear of falling?  Yes  limited by condition   Is the patient reluctant to leave their home because of  a fear of falling?  No   Home Environment   Living Environment Private residence   Living Arrangements Spouse/significant other   Type of [place] to enter   Entrance Stairs-Number of Steps 3   Entrance Stairs-Rails Right   Prior Function   Level of Independence Requires assistive device for independence  sometimes gets assist for balance   Vocation On disability   Cognition   Overall Cognitive Status Within Functional Limits for tasks assessed   Posture/Postural Control   Posture Comments Inprone he kept RT hip Jerene Pitch /elevatedand was unable to position RT leg into hip flexion >45-50 degrees in supine but was able to sit with hip flexed to 80 degrees without complaint.    ROM / Strength   AROM / PROM / Strength AROM;Strength   AROM   Overall AROM Comments He refused to attempt RT lkne flexion  but was able to lex LT knee tpo chest, He refused  to bridge  then bridged to turn on side   AROM Assessment Site  Cervical;Lumbar   Cervical Flexion 28   Cervical Extension 60   Cervical - Right Side Bend 43   Cervical - Left Side Bend 44   Cervical - Right Rotation 55   Cervical - Left Rotation 60   Lumbar Flexion He can touch his knees   Lumbar Extension 15   Lumbar - Right Side Bend 10   Lumbar - Left Side Bend 5   Lumbar - Right Rotation 25   Lumbar - Left Rotation 30   Strength   Overall Strength Comments UE strength WNL Not able to fully test LE and he expressed pain wiht touch to hip and with testing   Flexibility   Soft Tissue Assessment /Muscle Length yes   Hamstrings SLR RT 20 degrees with pain LT 50 degrees with less pain   Palpation   Palpation comment reports extreme tenderness RT lower back and RT gluteals  on RT less so on LT to palpation   Transfers   Comments He was able to position himself on the table from prone to sidely to supine without asssit and supine to sit toto supine   Ambulation/Gait   Gait Comments No device but he uses devices to walk. Shuffling gait , decreased LE flexion with swing.    Static Standing Balance   Static Standing - Balance Support No upper extremity supported   Static Standing - Level of Assistance 6: Modified independent (Device/Increase time)   Static Standing Balance -  Activities  Single Leg Stance - Right Leg;Single Leg Stance - Left Leg   Static Standing - Comment/# of Minutes 5 sec max RT and Lt and after stating he could not do this on the RT he eneded up doing this activity.                            PT Education - 08/18/15 1232    Education provided Yes   Education Details POC   Person(s) Educated Patient   Methods Explanation   Comprehension Verbalized understanding          PT Short Term Goals - 08/18/15 1241    PT SHORT TERM GOAL #1   Title He will be ale to demo inital HEP   Time 3   Period Weeks   Status New   PT SHORT TERM GOAL #2   Title He will report 25% decrease  in neck with normal activity    Time 3   Period Weeks   Status New   PT SHORT TERM GOAL #3   Title He will repport back and RT leg symptoms 25% or more   Time 3   Period Weeks   Status New           PT Long Term Goals - 08/18/15 1242    PT LONG TERM GOAL #1   Title He will be independent with all HEP issued   Time 6   Period Weeks   Status New   PT LONG TERM GOAL #2   Title He will report pain eaed 40-50% in neck   Time 6   Period Weeks   Status New   PT LONG TERM GOAL #3   Title He will report pain eased in back and RT leg by 40-50% and report able to walk better level surfaces and stairs.    Time 6   Period Weeks   Status New               Plan - 08/18/15 1233    Clinical Impression Statement Mr Myracle presents with moderate level evaluation with reports of chronic neck and back pain with pain into RT leg (that is more recent onset in past month after  back injections )with decreased motion in LE and neck and spine and LT shoulder, abnormal posture , spasms, liniting mobility and normal activity. He reports pain levels at 10/10 though he has a pleasant( though frustrated) affect and is able to walk and do most tasks except with some RT leg motions that he refused to move , due to pain. He was not sure PT was the appropriate place for him now due to the new leg symptoms he has so I had him schedule appointments  and he can call to cancel if he opts not to come to PT now.   Rehab Potential Fair   PT Frequency 2x / week   PT Duration 6 weeks   PT Treatment/Interventions Electrical Stimulation;Moist Heat;Therapeutic exercise;Manual techniques;Taping;Dry needling;Patient/family education;Passive range of motion   PT Next Visit Plan STW, ROM , manual, modalities   Consulted and Agree with Plan of Care Patient      Patient will benefit from skilled therapeutic intervention in order to improve the following deficits and impairments:  Abnormal gait, Difficulty walking, Pain, Decreased balance,  Postural dysfunction, Decreased activity tolerance, Decreased range of motion  Visit Diagnosis: Cervicalgia - Plan: PT plan of care cert/re-cert  Bilateral low back pain with right-sided sciatica - Plan: PT plan of care cert/re-cert  Cramp and spasm - Plan: PT plan of care cert/re-cert  Abnormal posture - Plan: PT plan of care cert/re-cert  Difficulty in walking, not elsewhere classified - Plan: PT plan of care cert/re-cert      G-Codes - [id number]    Functional Assessment Tool Used FOTO 50% limited   Functional Limitation Changing and maintaining body position   Changing and Maintaining Body Position Current Status [id number]) At least 40 percent but less than 60 percent impaired, limited or restricted   Changing and Maintaining Body Position Goal Status [id number]) At least 20 percent but less than 40 percent impaired, limited or restricted       Problem List Patient Active Problem List   Diagnosis Date Noted  . Bronchopneumonia 09/28/2011  . COPD with exacerbation ([place]) 09/28/2011  . Chest pain 09/28/2011  . Lumbago   . Tobacco abuse   .  Nicotine abuse 03/09/2011  . COPD (chronic obstructive pulmonary disease) ([place]) 03/07/2011  . Cocaine abuse   . GERD (gastroesophageal reflux disease)   . Bullous emphysema ([place])   . Lung mass     Darrel Hoover  PT 08/18/2015, 12:50 PM  [facility] [address] [place], [place], [address] Phone: [phone]   Fax:  [phone]  Name: LEANDRA CHISUM MRN: [id number] Date of Birth: 01-25-1951

## 2015-08-25 ENCOUNTER — Other Ambulatory Visit: Payer: Self-pay | Admitting: *Deleted

## 2015-08-25 ENCOUNTER — Encounter: Payer: Self-pay | Admitting: *Deleted

## 2015-08-25 NOTE — Progress Notes (Signed)
This encounter was created in error - please disregard.

## 2015-08-25 NOTE — Patient Outreach (Signed)
[place] [facility]) Care Management  08/25/2015  Peter Sims 1950-06-25 [id number]  Subjective:  Telephone call to patient's home number, no answer, left HIPAA compliant voicemail message, and requested call back.  Objective: Per chart review: Patient had ED visit on 08/04/15 - 08/05/15 for back pain. Patient left ED, no MD provider notes in chart and stated he would follow up with primary MD. Patient had ED visit on 06/12/15 for chest pain.   Assessment: Received UMR member self/ spouse referral on 08/06/15. Referral reason: Spouse states she is needing some help with husband. Telephone screen completed.  Care coordination follow up, pending patient contact.  Patient will continue to receive Telephonic [facility] Care Management services for care coordination.   Plan: RNCM will call patient within 3 weeks, 2nd outreach attempt, to follow up on care coordination needs (verify durable medical equipment vendor choice, verify status of oxygen supplies, new MD provider status) and complete initial assessment. RNCM will verify status of outpatient physical therapy within 3 weeks.  RNCM will follow up with patient within 3 weeks to verify status of pulmonologist, rheumatologist, orthopedist, and primary MD.   Colbert Coyer. Annia Friendly, BSN, [place] Telephonic CM Phone: [phone] Fax: [phone]

## 2015-08-27 ENCOUNTER — Other Ambulatory Visit: Payer: Self-pay | Admitting: *Deleted

## 2015-08-27 NOTE — Patient Outreach (Signed)
[facility] [facility]) Care Management  08/27/2015  Peter Sims Jun 12, 1950 [id number]   Subjective: Telephone call to patient's home number, no answer, left HIPAA compliant voicemail message, and requested call back.  Objective: Per chart review: Patient had ED visit on 08/04/15 - 08/05/15 for back pain. Patient left ED, no MD provider notes in chart and stated he would follow up with primary MD. Patient had ED visit on 06/12/15 for chest pain.   Assessment: Received UMR member self/ spouse referral on 08/06/15. Referral reason: Spouse states she is needing some help with husband. Telephone screen completed. Care coordination follow up, pending patient contact. Patient will continue to receive Telephonic [facility] Care Management services for care coordination.   Plan: RNCM will call patient within 2 weeks, 3rd outreach attempt, to follow up on care coordination needs (verify durable medical equipment vendor choice, verify status of oxygen supplies, new MD provider status) and complete initial assessment. RNCM will verify status of outpatient physical therapy within 2 weeks.  RNCM will follow up with patient within 2 weeks to verify status of pulmonologist, rheumatologist, orthopedist, and primary MD.   Colbert Coyer. Annia Friendly, BSN, [place] Telephonic CM Phone: [phone] Fax: [phone]

## 2015-08-30 ENCOUNTER — Ambulatory Visit: Payer: 59 | Attending: Orthopedic Surgery

## 2015-08-30 DIAGNOSIS — R252 Cramp and spasm: Secondary | ICD-10-CM | POA: Diagnosis not present

## 2015-08-30 DIAGNOSIS — M542 Cervicalgia: Secondary | ICD-10-CM | POA: Diagnosis not present

## 2015-08-30 DIAGNOSIS — R293 Abnormal posture: Secondary | ICD-10-CM | POA: Insufficient documentation

## 2015-08-30 DIAGNOSIS — R262 Difficulty in walking, not elsewhere classified: Secondary | ICD-10-CM | POA: Insufficient documentation

## 2015-08-30 DIAGNOSIS — M5441 Lumbago with sciatica, right side: Secondary | ICD-10-CM | POA: Insufficient documentation

## 2015-08-30 NOTE — Therapy (Addendum)
[place], [place], [address] Phone: [phone]   Fax:  [phone]  Physical Therapy Treatment/ Discharge  Patient Details  Name: Peter Sims MRN: [id number] Date of Birth: [birth year] Referring Provider: Normajean Glasgow, MD  Encounter Date: 08/30/2015      PT End of Session - 08/30/15 1029    Visit Number 2   Number of Visits 12   Date for PT Re-Evaluation 09/29/15   PT Start Time 1018   PT Stop Time 1115   PT Time Calculation (min) 57 min   Activity Tolerance Patient limited by pain   Behavior During Therapy [facility] for tasks assessed/performed      Past Medical History  Diagnosis Date  . Cocaine abuse   . GERD (gastroesophageal reflux disease)   . Bullous emphysema (HCC)     Severe Bilateral  . Lung mass     Right Upper Lobe pleural based mass, negative  on PET Scan  . COPD (chronic obstructive pulmonary disease) ([place])   . Lumbago   . Tobacco abuse   . Therapeutic drug monitoring   . Colitis     with bleeding  . LBP (low back pain)     now chronic  . Neck pain     nerve pain   . Spine pain     nerve    No past surgical history on file.  There were no vitals filed for this visit.      Subjective Assessment - 08/30/15 1025    Subjective No changes. Pain 8/10 today.    Currently in Pain? Yes   Pain Score 8    Pain Location Neck  and back   Pain Orientation Right;Left;Posterior;Lower   Pain Descriptors / Indicators Numbness   Pain Type Chronic pain   Pain Onset More than a month ago   Pain Frequency Constant   Aggravating Factors  activity   Pain Relieving Factors ?, meds   Multiple Pain Sites Yes                         [facility] Adult PT Treatment/Exercise - 08/30/15 0001    Neck Exercises: Theraband   Scapula Retraction Limitations scapu;la rolls x 5 reps   Shoulder External Rotation 10 reps;Red   Other Theraband Exercises bicep curls x 10 RT and LT     Lumbar Exercises: Seated   Other Seated Lumbar Exercises sit fit ant/post  and side to side x 12 each   Lumbar Exercises: Supine   Ab Set 10 reps;3 seconds   AB Set Limitations needed much cuing and he reported spasms    Glut Set 10 reps;3 seconds   Clam 10 reps   Clam Limitations then 5 each leg   Other Supine Lumbar Exercises legs on ball with movement of ball side to side and Lr flexion and extension x 10    Modalities   Modalities Electrical Stimulation;Moist Heat   Moist Heat Therapy   Number Minutes Moist Heat 18 Minutes   Moist Heat Location Cervical;Lumbar Spine   Electrical Stimulation   Electrical Stimulation Location neck and back   Electrical Stimulation Action IFC   Electrical Stimulation Parameters L8   Electrical Stimulation Goals Pain   Manual Therapy   Manual Therapy Manual Traction   Manual Traction cervical 15-30 sec pulls x 8 " That actually feels good  "  with some lateral emphasis RT and LT along with straight pull  PT Short Term Goals - 08/18/15 1241    PT SHORT TERM GOAL #1   Title He will be ale to demo inital HEP   Time 3   Period Weeks   Status New   PT SHORT TERM GOAL #2   Title He will report 25% decrease in neck with normal activity   Time 3   Period Weeks   Status New   PT SHORT TERM GOAL #3   Title He will repport back and RT leg symptoms 25% or more   Time 3   Period Weeks   Status New           PT Long Term Goals - 08/18/15 1242    PT LONG TERM GOAL #1   Title He will be independent with all HEP issued   Time 6   Period Weeks   Status New   PT LONG TERM GOAL #2   Title He will report pain eaed 40-50% in neck   Time 6   Period Weeks   Status New   PT LONG TERM GOAL #3   Title He will report pain eased in back and RT leg by 40-50% and report able to walk better level surfaces and stairs.    Time 6   Period Weeks   Status New               Plan - 08/30/15 1030    Clinical Impression  Statement He did all exercise most with report of spasm but he completed all . He liked the cervical traction manual and modalities. Witll continue with exercise manual and modalities' Possible request for home TENS   PT Next Visit Plan STW, ROM , manual, modalities Start HEP   Consulted and Agree with Plan of Care Patient      Patient will benefit from skilled therapeutic intervention in order to improve the following deficits and impairments:  Abnormal gait, Difficulty walking, Pain, Decreased balance, Postural dysfunction, Decreased activity tolerance, Decreased range of motion  Visit Diagnosis: Cervicalgia  Bilateral low back pain with right-sided sciatica  Cramp and spasm  Abnormal posture  Difficulty in walking, not elsewhere classified     Problem List Patient Active Problem List   Diagnosis Date Noted  . Bronchopneumonia 09/28/2011  . COPD with exacerbation (HCC) 09/28/2011  . Chest pain 09/28/2011  . Lumbago   . Tobacco abuse   . Nicotine abuse 03/09/2011  . COPD (chronic obstructive pulmonary disease) (HCC) 03/07/2011  . Cocaine abuse   . GERD (gastroesophageal reflux disease)   . Bullous emphysema (HCC)   . Lung mass     Peter Sims  PT 08/30/2015, 11:03 AM  [facility] [address] [place], [place], [address] Phone: [phone]   Fax:  [phone]  Name: Peter Sims MRN: [id number] Date of Birth: 12/22/1950    PHYSICAL THERAPY DISCHARGE SUMMARY  Visits from Start of Care: 2  Current functional level related to goals / functional outcomes: Unknown . He no showed 3 appointments then canceled all appointments stating he did not need the services   Remaining deficits: Unknown   Education / Equipment: HEP  Plan: Patient agrees to discharge.  Patient goals were not met. Patient is being discharged due to the patient's request.  ?????    Stephen Sims Chasse, PT  10/26/15     2:52 PM 

## 2015-09-01 ENCOUNTER — Ambulatory Visit: Payer: 59

## 2015-09-02 ENCOUNTER — Other Ambulatory Visit: Payer: Self-pay | Admitting: *Deleted

## 2015-09-02 ENCOUNTER — Encounter: Payer: Self-pay | Admitting: *Deleted

## 2015-09-02 NOTE — Patient Outreach (Signed)
[place] [facility]) Care Management  09/02/2015  CHANEY SY [birth year] [id number]   Subjective: Telephone call to patient's home number, spoke with male answering the phone, states patient not available, left HIPAA compliant message with male answering phone for patient, and requested call back.  Objective: Per chart review: Patient had ED visit on 08/04/15 - 08/05/15 for back pain. Patient left ED, no MD provider notes in chart and stated he would follow up with primary MD. Patient had ED visit on 06/12/15 for chest pain.   Patient receiving outpatient physical therapy with [facility].   Assessment: Received UMR member self/ spouse referral on 08/06/15. Referral reason: Spouse states she is needing some help with husband. Telephone screen completed. Care coordination follow up, pending patient contact. Patient will continue to receive [facility] Care Management services for care coordination.   Plan:  RNCM will send patient unsuccessful outreach letter, [facility] pamphlet, and proceed with case closure if no return call from patient, within 10 business days.     Juanna Pudlo H. Annia Friendly, BSN, [place] Telephonic CM Phone: [phone] Fax: [phone]

## 2015-09-06 ENCOUNTER — Ambulatory Visit: Payer: 59

## 2015-09-08 ENCOUNTER — Ambulatory Visit: Payer: 59

## 2015-09-13 ENCOUNTER — Ambulatory Visit: Payer: 59

## 2015-09-16 ENCOUNTER — Other Ambulatory Visit: Payer: Self-pay | Admitting: *Deleted

## 2015-09-16 NOTE — Patient Outreach (Signed)
[place] [facility]) Care Management  09/16/2015  Peter Sims [facility] 19-Sep-1950 [id number]   No response from patient outreach attempts, will proceed with case closure.  Objective: Per chart review: Patient had ED visit on 08/04/15 - 08/05/15 for back pain. Patient left ED, no MD provider notes in chart and stated he would follow up with primary MD. Patient had ED visit on 06/12/15 for chest pain. Patient receiving outpatient physical therapy with [facility].   Assessment: Received UMR member self/ spouse referral on 08/06/15. Referral reason: Spouse states she is needing some help with husband. Telephone screen completed. Care coordination follow up, unsuccessful due to unable to reach patient, and will proceed with case closure.   Plan: RNCM will send case closure due to unable to reach request to [facility] Management.     Jennilee Demarco H. Annia Friendly, BSN, [place] Telephonic CM Phone: [phone] Fax: [phone]

## 2015-10-15 MED FILL — SYMBICORT 160-4.5 MCG INH: 160-4.5 | 30 days supply | Qty: 10 | Fill #0

## 2015-10-20 DIAGNOSIS — M5431 Sciatica, right side: Secondary | ICD-10-CM | POA: Diagnosis not present

## 2015-10-20 DIAGNOSIS — I10 Essential (primary) hypertension: Secondary | ICD-10-CM | POA: Diagnosis not present

## 2015-10-20 DIAGNOSIS — R7309 Other abnormal glucose: Secondary | ICD-10-CM | POA: Diagnosis not present

## 2015-10-20 DIAGNOSIS — E785 Hyperlipidemia, unspecified: Secondary | ICD-10-CM | POA: Diagnosis not present

## 2015-11-23 DIAGNOSIS — J449 Chronic obstructive pulmonary disease, unspecified: Secondary | ICD-10-CM | POA: Diagnosis not present

## 2015-11-23 DIAGNOSIS — Z23 Encounter for immunization: Secondary | ICD-10-CM | POA: Diagnosis not present

## 2015-11-23 DIAGNOSIS — R413 Other amnesia: Secondary | ICD-10-CM | POA: Diagnosis not present

## 2015-11-23 DIAGNOSIS — M5431 Sciatica, right side: Secondary | ICD-10-CM | POA: Diagnosis not present

## 2015-11-23 DIAGNOSIS — L84 Corns and callosities: Secondary | ICD-10-CM | POA: Diagnosis not present

## 2015-12-06 MED FILL — SYMBICORT 160-4.5 MCG INH: 160-4.5 | 30 days supply | Qty: 10 | Fill #1

## 2015-12-15 MED FILL — DICLOFENAC SOD 75 MG TAB EC: 75 | 30 days supply | Qty: 60 | Fill #0

## 2015-12-15 MED FILL — predniSONE 10 MG TABS: 10 | 6 days supply | Qty: 21 | Fill #0

## 2016-01-07 MED FILL — LOSARTAN POTASSIUM 50 MG TA: 50 | 90 days supply | Qty: 90 | Fill #0

## 2016-01-31 DIAGNOSIS — J449 Chronic obstructive pulmonary disease, unspecified: Secondary | ICD-10-CM | POA: Diagnosis not present

## 2016-01-31 DIAGNOSIS — J320 Chronic maxillary sinusitis: Secondary | ICD-10-CM | POA: Diagnosis not present

## 2016-01-31 DIAGNOSIS — R7309 Other abnormal glucose: Secondary | ICD-10-CM | POA: Diagnosis not present

## 2016-01-31 DIAGNOSIS — F17200 Nicotine dependence, unspecified, uncomplicated: Secondary | ICD-10-CM | POA: Diagnosis not present

## 2016-01-31 DIAGNOSIS — R202 Paresthesia of skin: Secondary | ICD-10-CM | POA: Diagnosis not present

## 2016-01-31 MED FILL — AZITHROMYCIN 250 MG TABLET: 250 | 5 days supply | Qty: 6 | Fill #0

## 2016-02-16 MED FILL — SYMBICORT 160-4.5 MCG INH: 160-4.5 | 30 days supply | Qty: 10 | Fill #2

## 2016-03-07 DIAGNOSIS — M5126 Other intervertebral disc displacement, lumbar region: Secondary | ICD-10-CM | POA: Diagnosis not present

## 2016-03-07 DIAGNOSIS — G5602 Carpal tunnel syndrome, left upper limb: Secondary | ICD-10-CM | POA: Diagnosis not present

## 2016-03-07 DIAGNOSIS — M4802 Spinal stenosis, cervical region: Secondary | ICD-10-CM | POA: Diagnosis not present

## 2016-03-07 DIAGNOSIS — G5621 Lesion of ulnar nerve, right upper limb: Secondary | ICD-10-CM | POA: Diagnosis not present

## 2016-03-07 DIAGNOSIS — M4722 Other spondylosis with radiculopathy, cervical region: Secondary | ICD-10-CM | POA: Diagnosis not present

## 2016-03-07 DIAGNOSIS — G5601 Carpal tunnel syndrome, right upper limb: Secondary | ICD-10-CM | POA: Diagnosis not present

## 2016-03-23 DIAGNOSIS — G5603 Carpal tunnel syndrome, bilateral upper limbs: Secondary | ICD-10-CM | POA: Diagnosis not present

## 2016-03-23 DIAGNOSIS — M9981 Other biomechanical lesions of cervical region: Secondary | ICD-10-CM | POA: Diagnosis not present

## 2016-03-23 DIAGNOSIS — G5623 Lesion of ulnar nerve, bilateral upper limbs: Secondary | ICD-10-CM | POA: Diagnosis not present

## 2016-03-23 MED FILL — SYMBICORT 160-4.5 MCG INH: 160-4.5 | 30 days supply | Qty: 10 | Fill #3

## 2016-03-29 DIAGNOSIS — I10 Essential (primary) hypertension: Secondary | ICD-10-CM | POA: Diagnosis not present

## 2016-03-29 DIAGNOSIS — J449 Chronic obstructive pulmonary disease, unspecified: Secondary | ICD-10-CM | POA: Diagnosis not present

## 2016-03-29 DIAGNOSIS — R51 Headache: Secondary | ICD-10-CM | POA: Diagnosis not present

## 2016-04-04 ENCOUNTER — Other Ambulatory Visit: Payer: Self-pay | Admitting: Internal Medicine

## 2016-04-04 DIAGNOSIS — R519 Headache, unspecified: Secondary | ICD-10-CM

## 2016-04-04 DIAGNOSIS — R51 Headache: Principal | ICD-10-CM

## 2016-04-05 ENCOUNTER — Other Ambulatory Visit: Payer: 59

## 2016-04-12 ENCOUNTER — Other Ambulatory Visit (HOSPITAL_COMMUNITY): Payer: Self-pay | Admitting: Internal Medicine

## 2016-04-12 DIAGNOSIS — R519 Headache, unspecified: Secondary | ICD-10-CM

## 2016-04-12 DIAGNOSIS — R51 Headache: Principal | ICD-10-CM

## 2016-04-17 ENCOUNTER — Other Ambulatory Visit: Payer: Self-pay | Admitting: Neurosurgery

## 2016-04-17 DIAGNOSIS — I10 Essential (primary) hypertension: Secondary | ICD-10-CM | POA: Diagnosis not present

## 2016-04-17 DIAGNOSIS — Z6825 Body mass index (BMI) 25.0-25.9, adult: Secondary | ICD-10-CM | POA: Diagnosis not present

## 2016-04-17 DIAGNOSIS — G5602 Carpal tunnel syndrome, left upper limb: Secondary | ICD-10-CM | POA: Diagnosis not present

## 2016-04-19 ENCOUNTER — Ambulatory Visit (HOSPITAL_COMMUNITY)
Admission: RE | Admit: 2016-04-19 | Discharge: 2016-04-19 | Disposition: A | Discharge status: Home / Self Care | Payer: 59 | Source: Ambulatory Visit | Attending: Internal Medicine | Admitting: Internal Medicine

## 2016-04-19 DIAGNOSIS — R519 Headache, unspecified: Secondary | ICD-10-CM

## 2016-04-19 DIAGNOSIS — R51 Headache: Secondary | ICD-10-CM | POA: Insufficient documentation

## 2016-04-26 ENCOUNTER — Encounter (HOSPITAL_COMMUNITY): Payer: Self-pay

## 2016-04-26 ENCOUNTER — Encounter (HOSPITAL_COMMUNITY)
Admission: RE | Admit: 2016-04-26 | Discharge: 2016-04-26 | Disposition: A | Discharge status: Home / Self Care | Payer: 59 | Source: Ambulatory Visit | Attending: Neurosurgery | Admitting: Neurosurgery

## 2016-04-26 DIAGNOSIS — Z01812 Encounter for preprocedural laboratory examination: Secondary | ICD-10-CM | POA: Insufficient documentation

## 2016-04-26 HISTORY — DX: Dyspnea, unspecified: R06.00

## 2016-04-26 HISTORY — DX: Pneumonia, unspecified organism: J18.9

## 2016-04-26 HISTORY — DX: Unspecified osteoarthritis, unspecified site: M19.90

## 2016-04-26 HISTORY — DX: Essential (primary) hypertension: I10

## 2016-04-26 HISTORY — DX: Headache, unspecified: R51.9

## 2016-04-26 HISTORY — DX: Headache: R51

## 2016-04-26 LAB — COMPREHENSIVE METABOLIC PANEL
ALBUMIN: 3.9 g/dL (ref 3.5–5.0)
ALT: 26 U/L (ref 17–63)
AST: 20 U/L (ref 15–41)
Alkaline Phosphatase: 91 U/L (ref 38–126)
Anion gap: 8 (ref 5–15)
BUN: 12 mg/dL (ref 6–20)
CHLORIDE: 106 mmol/L (ref 101–111)
CO2: 25 mmol/L (ref 22–32)
Calcium: 9.9 mg/dL (ref 8.9–10.3)
Creatinine, Ser: 1.06 mg/dL (ref 0.61–1.24)
GFR calc Af Amer: >60 mL/min (ref >60)
GLUCOSE: 113 mg/dL — AB (ref 65–99)
POTASSIUM: 4.8 mmol/L (ref 3.5–5.1)
SODIUM: 139 mmol/L (ref 135–145)
Total Bilirubin: 0.7 mg/dL (ref 0.3–1.2)
Total Protein: 7.3 g/dL (ref 6.5–8.1)

## 2016-04-26 LAB — CBC
HEMATOCRIT: 48.3 % (ref 39.0–52.0)
Hemoglobin: 16.2 g/dL (ref 13.0–17.0)
MCH: 29.4 pg (ref 26.0–34.0)
MCHC: 33.5 g/dL (ref 30.0–36.0)
MCV: 87.7 fL (ref 78.0–100.0)
Platelets: 206 10*3/uL (ref 150–400)
RBC: 5.51 MIL/uL (ref 4.22–5.81)
RDW: 15.6 % — AB (ref 11.5–15.5)
WBC: 8.8 10*3/uL (ref 4.0–10.5)

## 2016-04-26 LAB — SURGICAL PCR SCREEN
MRSA, PCR: NEGATIVE
STAPHYLOCOCCUS AUREUS: NEGATIVE

## 2016-04-26 MED FILL — IPRAT-ALBUT 0.5-3(2.5) MG/3: 0.5-2.5 (3) | 30 days supply | Qty: 360 | Fill #0

## 2016-04-26 NOTE — Pre-Procedure Instructions (Addendum)
Peter Sims  04/26/2016      [place], [place] - [address] [facility] [name] [place] [address] Phone: [phone] Fax: [phone]    Your procedure is scheduled on 05/03/16.  Report to [facility] Admitting at 800 A.M.  Call this number if you have problems the morning of surgery:  [phone]   Remember:  Do not eat food or drink liquids after midnight.  Take these medicines the morning of surgery with A SIP OF WATER       Inhalers as needed(bring albuterol inhaler ) Buspropion(wellbutrin),lyrica, omeprazole,eye drops if needed  STOP all herbel meds, nsaids (aleve,naproxen,advil,ibuprofen) Starting today including diclofenac(voltaren), aspirin, all vitamins/supplements   Do not wear jewelry, make-up or nail polish.  Do not wear lotions, powders, or perfumes, or deoderant.  Do not shave 48 hours prior to surgery.  Men may shave face and neck.  Do not bring valuables to the hospital.  [facility] is not responsible for any belongings or valuables.  Contacts, dentures or bridgework may not be worn into surgery.  Leave your suitcase in the car.  After surgery it may be brought to your room.  For patients admitted to the hospital, discharge time will be determined by your treatment team.  Patients discharged the day of surgery will not be allowed to drive home.   Special instructions:   Special Instructions: [facility] - Preparing for Surgery  Before surgery, you can play an important role.  Because skin is not sterile, your skin needs to be as free of germs as possible.  You can reduce the number of germs on you skin by washing with CHG (chlorahexidine gluconate) soap before surgery.  CHG is an antiseptic cleaner which kills germs and bonds with the skin to continue killing germs even after washing.  Please DO NOT use if you have an allergy to CHG or antibacterial soaps.  If your skin becomes  reddened/irritated stop using the CHG and inform your nurse when you arrive at Short Stay.  Do not shave (including legs and underarms) for at least 48 hours prior to the first CHG shower.  You may shave your face.  Please follow these instructions carefully:   1.  Shower with CHG Soap the night before surgery and the morning of Surgery.  2.  If you choose to wash your hair, wash your hair first as usual with your normal shampoo.  3.  After you shampoo, rinse your hair and body thoroughly to remove the Shampoo.  4.  Use CHG as you would any other liquid soap.  You can apply chg directly  to the skin and wash gently with scrungie or a clean washcloth.  5.  Apply the CHG Soap to your body ONLY FROM THE NECK DOWN.  Do not use on open wounds or open sores.  Avoid contact with your eyes ears, mouth and genitals (private parts).  Wash genitals (private parts)       with your normal soap.  6.  Wash thoroughly, paying special attention to the area where your surgery will be performed.  7.  Thoroughly rinse your body with warm water from the neck down.  8.  DO NOT shower/wash with your normal soap after using and rinsing off the CHG Soap.  9.  Pat yourself dry with a clean towel.            10.  Wear clean pajamas.  11.  Place clean sheets on your bed the night of your first shower and do not sleep with pets.  Day of Surgery  Do not apply any lotions/deodorants the morning of surgery.  Please wear clean clothes to the hospital/surgery center.  Please read over the  fact sheets that you were given.

## 2016-04-27 ENCOUNTER — Encounter (HOSPITAL_COMMUNITY): Payer: Self-pay

## 2016-04-27 MED FILL — CYCLOBENZAPRINE 10 MG TAB: 10 | 20 days supply | Qty: 60 | Fill #0

## 2016-05-01 NOTE — Progress Notes (Signed)
Anesthesia Chart Review:  Pt is a 66 year old Peter Sims scheduled for L5-S1 microdiscectomy on 05/03/2016 with Ashok Pall, MD.   - PCP is Glendale Chard, MD - Pt used to see Francena Hanly, MD with pulmonology, last office visit 08/05/14.   PMH includes:  Bullous emphysema, severe COPD, HTN, hx cocaine abuse, GERD. Current smoker. BMI 26.  BP (!) 130/94   Pulse 84   Temp 36.4 C   Resp 20   Ht 6' (1.829 m)   Wt 193 lb 1.6 oz (87.6 kg)   SpO2 100%   BMI 26.19 kg/m   Medications include: Albuterol, Symbicort, ipratropium-albuterol nebulized, lisinopril, losartan, Prilosec  Preoperative labs reviewed.    CXR 06/12/15:  1. No active cardiopulmonary disease. 2. Emphysema.  EKG 06/12/15: Sinus rhythm with occasional PVCs  Echo 8/2/Peter:  - Left ventricle: The cavity size was normal. Wall thickness was normal. Systolic function was normal. The estimated ejection fraction was in the range of 60% to 65%. Wall motion was normal; there were no regional wall motion abnormalities. Doppler parameters are consistent with abnormal left ventricular relaxation (grade 1 diastolic dysfunction).   I spoke with pt by telephone.  He feels his COPD is stable.  No longer uses oxygen.  Has not had COPD exacerbation since prior to last visit with pulmonology 07/2014.  O2 sats at PAT were 100% on RA.   If no changes, I anticipate pt can proceed with surgery as scheduled.   Willeen Cass, FNP-BC [facility]/Anesthesiology Phone: [phone] 05/01/2016 5:19 PM

## 2016-05-03 ENCOUNTER — Ambulatory Visit (HOSPITAL_COMMUNITY)
Admission: RE | Admit: 2016-05-03 | Discharge: 2016-05-03 | Disposition: A | Discharge status: Home / Self Care | Payer: 59 | Source: Ambulatory Visit | Attending: Neurosurgery | Admitting: Neurosurgery

## 2016-05-03 ENCOUNTER — Ambulatory Visit (HOSPITAL_COMMUNITY): Payer: 59 | Admitting: Anesthesiology

## 2016-05-03 ENCOUNTER — Ambulatory Visit (HOSPITAL_COMMUNITY): Payer: 59

## 2016-05-03 ENCOUNTER — Encounter (HOSPITAL_COMMUNITY): Payer: Self-pay | Admitting: Anesthesiology

## 2016-05-03 ENCOUNTER — Encounter (HOSPITAL_COMMUNITY)
Admission: RE | Disposition: A | Discharge status: Home / Self Care | Payer: Self-pay | Source: Ambulatory Visit | Attending: Neurosurgery

## 2016-05-03 ENCOUNTER — Ambulatory Visit (HOSPITAL_COMMUNITY): Payer: 59 | Admitting: Emergency Medicine

## 2016-05-03 DIAGNOSIS — Z791 Long term (current) use of non-steroidal anti-inflammatories (NSAID): Secondary | ICD-10-CM | POA: Diagnosis not present

## 2016-05-03 DIAGNOSIS — I10 Essential (primary) hypertension: Secondary | ICD-10-CM | POA: Insufficient documentation

## 2016-05-03 DIAGNOSIS — Z79899 Other long term (current) drug therapy: Secondary | ICD-10-CM | POA: Insufficient documentation

## 2016-05-03 DIAGNOSIS — M5126 Other intervertebral disc displacement, lumbar region: Secondary | ICD-10-CM | POA: Diagnosis not present

## 2016-05-03 DIAGNOSIS — Z880 Allergy status to penicillin: Secondary | ICD-10-CM | POA: Insufficient documentation

## 2016-05-03 DIAGNOSIS — K219 Gastro-esophageal reflux disease without esophagitis: Secondary | ICD-10-CM | POA: Insufficient documentation

## 2016-05-03 DIAGNOSIS — M5127 Other intervertebral disc displacement, lumbosacral region: Secondary | ICD-10-CM | POA: Diagnosis not present

## 2016-05-03 DIAGNOSIS — Z419 Encounter for procedure for purposes other than remedying health state, unspecified: Secondary | ICD-10-CM

## 2016-05-03 DIAGNOSIS — J449 Chronic obstructive pulmonary disease, unspecified: Secondary | ICD-10-CM | POA: Insufficient documentation

## 2016-05-03 DIAGNOSIS — Z9889 Other specified postprocedural states: Secondary | ICD-10-CM | POA: Diagnosis not present

## 2016-05-03 DIAGNOSIS — R918 Other nonspecific abnormal finding of lung field: Secondary | ICD-10-CM | POA: Diagnosis not present

## 2016-05-03 DIAGNOSIS — M5117 Intervertebral disc disorders with radiculopathy, lumbosacral region: Secondary | ICD-10-CM | POA: Diagnosis not present

## 2016-05-03 DIAGNOSIS — F17210 Nicotine dependence, cigarettes, uncomplicated: Secondary | ICD-10-CM | POA: Insufficient documentation

## 2016-05-03 DIAGNOSIS — Z7951 Long term (current) use of inhaled steroids: Secondary | ICD-10-CM | POA: Insufficient documentation

## 2016-05-03 HISTORY — PX: LUMBAR LAMINECTOMY/DECOMPRESSION MICRODISCECTOMY: SHX5026

## 2016-05-03 SURGERY — LUMBAR LAMINECTOMY/DECOMPRESSION MICRODISCECTOMY 1 LEVEL
Anesthesia: General | Site: Back | Laterality: Right

## 2016-05-03 MED ORDER — SENNOSIDES-DOCUSATE SODIUM 8.6-50 MG PO TABS: 1.0000 | ORAL_TABLET | Freq: Every evening | ORAL | Status: DC | PRN

## 2016-05-03 MED ORDER — MENTHOL 3 MG MT LOZG: 1.0000 | LOZENGE | OROMUCOSAL | Status: DC | PRN

## 2016-05-03 MED ORDER — NAPHAZOLINE-GLYCERIN 0.012-0.2 % OP SOLN
2.0000 [drp] | Freq: Four times a day (QID) | OPHTHALMIC | Status: DC | PRN
  Filled 2016-05-03: qty 15

## 2016-05-03 MED ORDER — PREGABALIN 50 MG PO CAPS: 50.0000 mg | ORAL_CAPSULE | Freq: Two times a day (BID) | ORAL | Status: DC

## 2016-05-03 MED ORDER — THROMBIN 5000 UNITS EX SOLR
CUTANEOUS | Status: DC | PRN
Start: 2016-05-03 — End: 2016-05-03
  Administered 2016-05-03 (×2): 5000 [IU] via TOPICAL

## 2016-05-03 MED ORDER — METHYLPREDNISOLONE ACETATE 80 MG/ML IJ SUSP
INTRAMUSCULAR | Status: DC | PRN
  Administered 2016-05-03: 80 mg

## 2016-05-03 MED ORDER — FENTANYL CITRATE (PF) 100 MCG/2ML IJ SOLN
INTRAMUSCULAR | Status: AC
  Filled 2016-05-03: qty 2

## 2016-05-03 MED ORDER — METHYLPREDNISOLONE ACETATE 80 MG/ML IJ SUSP
INTRAMUSCULAR | Status: AC
  Filled 2016-05-03: qty 1

## 2016-05-03 MED ORDER — LIDOCAINE-EPINEPHRINE (PF) 2 %-1:200000 IJ SOLN
INTRAMUSCULAR | Status: DC | PRN
  Administered 2016-05-03: 10 mL

## 2016-05-03 MED ORDER — FENTANYL CITRATE (PF) 100 MCG/2ML IJ SOLN
INTRAMUSCULAR | Status: DC | PRN
  Administered 2016-05-03: 100 ug via INTRAVENOUS
  Administered 2016-05-03 (×4): 50 ug via INTRAVENOUS

## 2016-05-03 MED ORDER — PROPOFOL 10 MG/ML IV BOLUS
INTRAVENOUS | Status: DC | PRN
  Administered 2016-05-03: 40 mg via INTRAVENOUS
  Administered 2016-05-03: 200 mg via INTRAVENOUS

## 2016-05-03 MED ORDER — 0.9 % SODIUM CHLORIDE (POUR BTL) OPTIME
TOPICAL | Status: DC | PRN
  Administered 2016-05-03: 1000 mL

## 2016-05-03 MED ORDER — LIDOCAINE HCL (CARDIAC) 20 MG/ML IV SOLN
INTRAVENOUS | Status: DC | PRN
  Administered 2016-05-03: 80 mg via INTRAVENOUS

## 2016-05-03 MED ORDER — DIAZEPAM 5 MG PO TABS: 5.0000 mg | ORAL_TABLET | Freq: Four times a day (QID) | ORAL | Status: DC | PRN

## 2016-05-03 MED ORDER — LIDOCAINE 2% (20 MG/ML) 5 ML SYRINGE
INTRAMUSCULAR | Status: AC
  Filled 2016-05-03: qty 5

## 2016-05-03 MED ORDER — FENTANYL CITRATE (PF) 100 MCG/2ML IJ SOLN
INTRAMUSCULAR | Status: DC | PRN
  Administered 2016-05-03: 100 ug via INTRAVENOUS

## 2016-05-03 MED ORDER — FENTANYL CITRATE (PF) 100 MCG/2ML IJ SOLN
50.0000 ug | INTRAMUSCULAR | Status: DC | PRN
  Administered 2016-05-03 (×2): 50 ug via INTRAVENOUS

## 2016-05-03 MED ORDER — KETOROLAC TROMETHAMINE 15 MG/ML IJ SOLN: 15.0000 mg | Freq: Four times a day (QID) | INTRAMUSCULAR | Status: DC

## 2016-05-03 MED ORDER — SODIUM CHLORIDE 0.9 % IV SOLN: 250.0000 mL | INTRAVENOUS | Status: DC

## 2016-05-03 MED ORDER — LACTATED RINGERS IV SOLN
INTRAVENOUS | Status: DC
  Administered 2016-05-03: 50 mL/h via INTRAVENOUS
  Administered 2016-05-03: 16:00:00 via INTRAVENOUS

## 2016-05-03 MED ORDER — BISACODYL 5 MG PO TBEC: 5.0000 mg | DELAYED_RELEASE_TABLET | Freq: Every day | ORAL | Status: DC | PRN

## 2016-05-03 MED ORDER — DICLOFENAC SODIUM 25 MG PO TBEC: 25.0000 mg | DELAYED_RELEASE_TABLET | Freq: Every day | ORAL | Status: DC

## 2016-05-03 MED ORDER — MAGNESIUM CITRATE PO SOLN: 1.0000 | Freq: Once | ORAL | Status: DC | PRN

## 2016-05-03 MED ORDER — PHENYLEPHRINE 40 MCG/ML (10ML) SYRINGE FOR IV PUSH (FOR BLOOD PRESSURE SUPPORT)
PREFILLED_SYRINGE | INTRAVENOUS | Status: AC
  Filled 2016-05-03: qty 10

## 2016-05-03 MED ORDER — DOCUSATE SODIUM 100 MG PO CAPS: 100.0000 mg | ORAL_CAPSULE | Freq: Two times a day (BID) | ORAL | Status: DC

## 2016-05-03 MED ORDER — GUAIFENESIN ER 600 MG PO TB12
600.0000 mg | ORAL_TABLET | Freq: Four times a day (QID) | ORAL | Status: DC | PRN
  Filled 2016-05-03: qty 1

## 2016-05-03 MED ORDER — MIDAZOLAM HCL 5 MG/5ML IJ SOLN
INTRAMUSCULAR | Status: DC | PRN
  Administered 2016-05-03: 2 mg via INTRAVENOUS

## 2016-05-03 MED ORDER — PROPOFOL 10 MG/ML IV BOLUS
INTRAVENOUS | Status: AC
  Filled 2016-05-03: qty 20

## 2016-05-03 MED ORDER — HYDROMORPHONE HCL 1 MG/ML IJ SOLN: 0.2500 mg | INTRAMUSCULAR | Status: DC | PRN

## 2016-05-03 MED ORDER — ACETAMINOPHEN 325 MG PO TABS: 650.0000 mg | ORAL_TABLET | ORAL | Status: DC | PRN

## 2016-05-03 MED ORDER — ONDANSETRON HCL 4 MG/2ML IJ SOLN
INTRAMUSCULAR | Status: DC | PRN
  Administered 2016-05-03: 4 mg via INTRAVENOUS

## 2016-05-03 MED ORDER — VANCOMYCIN HCL IN DEXTROSE 1-5 GM/200ML-% IV SOLN
INTRAVENOUS | Status: AC
  Filled 2016-05-03: qty 200

## 2016-05-03 MED ORDER — THROMBIN 5000 UNITS EX SOLR
CUTANEOUS | Status: AC
  Filled 2016-05-03: qty 10000

## 2016-05-03 MED ORDER — ONDANSETRON HCL 4 MG PO TABS: 4.0000 mg | ORAL_TABLET | Freq: Four times a day (QID) | ORAL | Status: DC | PRN

## 2016-05-03 MED ORDER — PANTOPRAZOLE SODIUM 40 MG PO TBEC: 40.0000 mg | DELAYED_RELEASE_TABLET | Freq: Every day | ORAL | Status: DC

## 2016-05-03 MED ORDER — CHLORHEXIDINE GLUCONATE CLOTH 2 % EX PADS: 6.0000 | MEDICATED_PAD | Freq: Once | CUTANEOUS | Status: DC

## 2016-05-03 MED ORDER — MOMETASONE FURO-FORMOTEROL FUM 200-5 MCG/ACT IN AERO
2.0000 | INHALATION_SPRAY | Freq: Two times a day (BID) | RESPIRATORY_TRACT | Status: DC
  Filled 2016-05-03: qty 8.8

## 2016-05-03 MED ORDER — ALUM & MAG HYDROXIDE-SIMETH 200-200-20 MG/5ML PO SUSP: 30.0000 mL | Freq: Four times a day (QID) | ORAL | Status: DC | PRN

## 2016-05-03 MED ORDER — SUGAMMADEX SODIUM 200 MG/2ML IV SOLN
INTRAVENOUS | Status: DC | PRN
  Administered 2016-05-03: 200 mg via INTRAVENOUS

## 2016-05-03 MED ORDER — SODIUM CHLORIDE 0.9% FLUSH: 3.0000 mL | INTRAVENOUS | Status: DC | PRN

## 2016-05-03 MED ORDER — IPRATROPIUM-ALBUTEROL 0.5-2.5 (3) MG/3ML IN SOLN
RESPIRATORY_TRACT | Status: AC
  Administered 2016-05-03: 3 mL
  Filled 2016-05-03: qty 3

## 2016-05-03 MED ORDER — BUPROPION HCL ER (XL) 150 MG PO TB24: 150.0000 mg | ORAL_TABLET | Freq: Every day | ORAL | Status: DC

## 2016-05-03 MED ORDER — BUPIVACAINE HCL (PF) 0.5 % IJ SOLN
INTRAMUSCULAR | Status: DC | PRN
  Administered 2016-05-03: 30 mL

## 2016-05-03 MED ORDER — ROCURONIUM BROMIDE 100 MG/10ML IV SOLN
INTRAVENOUS | Status: DC | PRN
  Administered 2016-05-03: 50 mg via INTRAVENOUS

## 2016-05-03 MED ORDER — SOD CITRATE-CITRIC ACID 500-334 MG/5ML PO SOLN: 30.0000 mL | Freq: Once | ORAL | Status: DC

## 2016-05-03 MED ORDER — LOSARTAN POTASSIUM 50 MG PO TABS: 50.0000 mg | ORAL_TABLET | Freq: Every day | ORAL | Status: DC

## 2016-05-03 MED ORDER — PHENOL 1.4 % MT LIQD: 1.0000 | OROMUCOSAL | Status: DC | PRN

## 2016-05-03 MED ORDER — LISINOPRIL 20 MG PO TABS: 10.0000 mg | ORAL_TABLET | Freq: Every day | ORAL | Status: DC

## 2016-05-03 MED ORDER — IPRATROPIUM-ALBUTEROL 0.5-2.5 (3) MG/3ML IN SOLN: 3.0000 mL | RESPIRATORY_TRACT | Status: DC | PRN

## 2016-05-03 MED ORDER — FENTANYL CITRATE (PF) 100 MCG/2ML IJ SOLN
INTRAMUSCULAR | Status: AC
  Filled 2016-05-03: qty 4

## 2016-05-03 MED ORDER — DEXAMETHASONE SODIUM PHOSPHATE 4 MG/ML IJ SOLN
INTRAMUSCULAR | Status: DC | PRN
  Administered 2016-05-03: 8 mg via INTRAVENOUS

## 2016-05-03 MED ORDER — PROMETHAZINE HCL 25 MG/ML IJ SOLN: 6.2500 mg | INTRAMUSCULAR | Status: DC | PRN

## 2016-05-03 MED ORDER — ALBUTEROL SULFATE (2.5 MG/3ML) 0.083% IN NEBU: 3.0000 mL | INHALATION_SOLUTION | Freq: Four times a day (QID) | RESPIRATORY_TRACT | Status: DC | PRN

## 2016-05-03 MED ORDER — ONDANSETRON HCL 4 MG/2ML IJ SOLN: 4.0000 mg | Freq: Four times a day (QID) | INTRAMUSCULAR | Status: DC | PRN

## 2016-05-03 MED ORDER — ZOLPIDEM TARTRATE 5 MG PO TABS: 5.0000 mg | ORAL_TABLET | Freq: Every evening | ORAL | Status: DC | PRN

## 2016-05-03 MED ORDER — SODIUM CHLORIDE 0.9% FLUSH: 3.0000 mL | Freq: Two times a day (BID) | INTRAVENOUS | Status: DC

## 2016-05-03 MED ORDER — VANCOMYCIN HCL IN DEXTROSE 1-5 GM/200ML-% IV SOLN
1000.0000 mg | INTRAVENOUS | Status: AC
  Administered 2016-05-03: 1000 mg via INTRAVENOUS

## 2016-05-03 MED ORDER — POTASSIUM CHLORIDE IN NACL 20-0.9 MEQ/L-% IV SOLN: INTRAVENOUS | Status: DC

## 2016-05-03 MED ORDER — ACETAMINOPHEN 650 MG RE SUPP: 650.0000 mg | RECTAL | Status: DC | PRN

## 2016-05-03 MED ORDER — HEMOSTATIC AGENTS (NO CHARGE) OPTIME
TOPICAL | Status: DC | PRN
  Administered 2016-05-03: 1 via TOPICAL

## 2016-05-03 MED ORDER — OXYCODONE HCL 5 MG PO TABS: 5.0000 mg | ORAL_TABLET | ORAL | Status: DC | PRN

## 2016-05-03 MED ORDER — MIDAZOLAM HCL 2 MG/2ML IJ SOLN
INTRAMUSCULAR | Status: AC
  Filled 2016-05-03: qty 2

## 2016-05-03 MED ORDER — LIDOCAINE-EPINEPHRINE (PF) 2 %-1:200000 IJ SOLN
INTRAMUSCULAR | Status: AC
  Filled 2016-05-03: qty 20

## 2016-05-03 MED FILL — HYDROCODON-APAP 5-325: 5-325 | 7 days supply | Qty: 28 | Fill #0

## 2016-05-03 SURGICAL SUPPLY — 56 items
ADH SKN CLS APL DERMABOND .7 (GAUZE/BANDAGES/DRESSINGS) ×1
APL SKNCLS STERI-STRIP NONHPOA (GAUZE/BANDAGES/DRESSINGS)
BAG DECANTER FOR FLEXI CONT (MISCELLANEOUS) ×2 IMPLANT
BENZOIN TINCTURE PRP APPL 2/3 (GAUZE/BANDAGES/DRESSINGS) IMPLANT
BLADE CLIPPER SURG (BLADE) IMPLANT
BUR MATCHSTICK NEURO 3.0 LAGG (BURR) ×2 IMPLANT
CANISTER SUCT 3000ML PPV (MISCELLANEOUS) ×2 IMPLANT
CARTRIDGE OIL MAESTRO DRILL (MISCELLANEOUS) ×1 IMPLANT
DECANTER SPIKE VIAL GLASS SM (MISCELLANEOUS) ×2 IMPLANT
DERMABOND ADVANCED (GAUZE/BANDAGES/DRESSINGS) ×1
DERMABOND ADVANCED .7 DNX12 (GAUZE/BANDAGES/DRESSINGS) ×1 IMPLANT
DIFFUSER DRILL AIR PNEUMATIC (MISCELLANEOUS) ×2 IMPLANT
DRAPE LAPAROTOMY 100X72X124 (DRAPES) ×2 IMPLANT
DRAPE MICROSCOPE LEICA (MISCELLANEOUS) ×2 IMPLANT
DRAPE POUCH INSTRU U-SHP 10X18 (DRAPES) ×2 IMPLANT
DRAPE SURG 17X23 STRL (DRAPES) ×2 IMPLANT
DURAPREP 26ML APPLICATOR (WOUND CARE) ×2 IMPLANT
ELECT REM PT RETURN 9FT ADLT (ELECTROSURGICAL) ×2
ELECTRODE REM PT RTRN 9FT ADLT (ELECTROSURGICAL) ×1 IMPLANT
GAUZE SPONGE 4X4 12PLY STRL (GAUZE/BANDAGES/DRESSINGS) IMPLANT
GAUZE SPONGE 4X4 16PLY XRAY LF (GAUZE/BANDAGES/DRESSINGS) IMPLANT
GLOVE BIO SURGEON STRL SZ8 (GLOVE) ×1 IMPLANT
GLOVE BIO SURGEON STRL SZ8.5 (GLOVE) ×1 IMPLANT
GLOVE BIOGEL PI IND STRL 8 (GLOVE) IMPLANT
GLOVE BIOGEL PI INDICATOR 8 (GLOVE) ×2
GLOVE ECLIPSE 6.5 STRL STRAW (GLOVE) ×2 IMPLANT
GLOVE ECLIPSE 7.5 STRL STRAW (GLOVE) ×2 IMPLANT
GLOVE EXAM NITRILE LRG STRL (GLOVE) IMPLANT
GLOVE EXAM NITRILE XL STR (GLOVE) IMPLANT
GLOVE EXAM NITRILE XS STR PU (GLOVE) IMPLANT
GOWN STRL REUS W/ TWL LRG LVL3 (GOWN DISPOSABLE) ×2 IMPLANT
GOWN STRL REUS W/ TWL XL LVL3 (GOWN DISPOSABLE) IMPLANT
GOWN STRL REUS W/TWL 2XL LVL3 (GOWN DISPOSABLE) ×1 IMPLANT
GOWN STRL REUS W/TWL LRG LVL3 (GOWN DISPOSABLE) ×2
GOWN STRL REUS W/TWL XL LVL3 (GOWN DISPOSABLE) ×2
KIT BASIN OR (CUSTOM PROCEDURE TRAY) ×2 IMPLANT
KIT ROOM TURNOVER OR (KITS) ×2 IMPLANT
NDL HYPO 25X1 1.5 SAFETY (NEEDLE) ×1 IMPLANT
NDL SPNL 18GX3.5 QUINCKE PK (NEEDLE) IMPLANT
NEEDLE HYPO 25X1 1.5 SAFETY (NEEDLE) ×2 IMPLANT
NEEDLE SPNL 18GX3.5 QUINCKE PK (NEEDLE) ×2 IMPLANT
NS IRRIG 1000ML POUR BTL (IV SOLUTION) ×2 IMPLANT
OIL CARTRIDGE MAESTRO DRILL (MISCELLANEOUS) ×2
PACK LAMINECTOMY NEURO (CUSTOM PROCEDURE TRAY) ×2 IMPLANT
PAD ARMBOARD 7.5X6 YLW CONV (MISCELLANEOUS) ×6 IMPLANT
RUBBERBAND STERILE (MISCELLANEOUS) ×4 IMPLANT
SPONGE LAP 4X18 X RAY DECT (DISPOSABLE) IMPLANT
SPONGE SURGIFOAM ABS GEL SZ50 (HEMOSTASIS) ×2 IMPLANT
STRIP CLOSURE SKIN 1/2X4 (GAUZE/BANDAGES/DRESSINGS) IMPLANT
SUT VIC AB 0 CT1 18XCR BRD8 (SUTURE) ×1 IMPLANT
SUT VIC AB 0 CT1 8-18 (SUTURE) ×2
SUT VIC AB 2-0 CT1 18 (SUTURE) ×2 IMPLANT
SUT VIC AB 3-0 SH 8-18 (SUTURE) ×2 IMPLANT
TOWEL GREEN STERILE (TOWEL DISPOSABLE) ×2 IMPLANT
TOWEL GREEN STERILE FF (TOWEL DISPOSABLE) ×2 IMPLANT
WATER STERILE IRR 1000ML POUR (IV SOLUTION) ×2 IMPLANT

## 2016-05-03 NOTE — Discharge Instructions (Signed)
Lumbar Discectomy °Care After °A discectomy involves removal of discmaterial (the cartilage-like structures located between the bones of the back). It is done to relieve pressure on nerve roots. It can be used as a treatment for a back problem. The time in surgery depends on the findings in surgery and what is necessary to correct the problems. °HOME CARE INSTRUCTIONS  °· Check the cut (incision) made by the surgeon twice a day for signs of infection. Some signs of infection may include:  °· A foul smelling, greenish or yellowish discharge from the wound.  °· Increased pain.  °· Increased redness over the incision (operative) site.  °· The skin edges may separate.  °· Flu-like symptoms (problems).  °· A temperature above 101.5° F (38.6° C).  °· Change your bandages in about 24 to 36 hours following surgery or as directed.  °· You may shower tomrrow.  Avoid bathtubs, swimming pools and hot tubs for three weeks or until your incision has healed completely. °· Follow your doctor's instructions as to safe activities, exercises, and physical therapy.  °· Weight reduction may be beneficial if you are overweight.  °· Daily exercise is helpful to prevent the return of problems. Walking is permitted. You may use a treadmill without an incline. Cut down on activities and exercise if you have discomfort. You may also go up and down stairs as much as you can tolerate.  °· DO NOT lift anything heavier than 10 to 15 lbs. Avoid bending or twisting at the waist. Always bend your knees when lifting.  °· Maintain strength and range of motion as instructed.  °· Do not drive for 10 days, or as directed by your doctors. You may be a passenger . Lying back in the passenger seat may be more comfortable for you. Always wear a seatbelt.  °· Limit your sitting in a regular chair to 20 to 30 minutes at a time. There are no limitations for sitting in a recliner. You should lie down or walk in between sitting periods.  °· Only take  over-the-counter or prescription medicines for pain, discomfort, or fever as directed by your caregiver.  °SEEK MEDICAL CARE IF:  °· There is increased bleeding (more than a small spot) from the wound.  °· You notice redness, swelling, or increasing pain in the wound.  °· Pus is coming from wound.  °· You develop an unexplained oral temperature above 102° F (38.9° C) develops.  °· You notice a foul smell coming from the wound or dressing.  °· You have increasing pain in your wound.  °SEEK IMMEDIATE MEDICAL CARE IF:  °· You develop a rash.  °· You have difficulty breathing.  °· You develop any allergic problems to medicines given.  °Document Released: 01/19/2004 Document Revised: 02/02/2011 Document Reviewed: 05/09/2007 °ExitCare® Patient Information °

## 2016-05-03 NOTE — Discharge Summary (Signed)
Physician Discharge Summary  [name] MRN: [id number] DOB/AGE: 1950/04/13 66 y.o.  Admit date: 05/03/2016 Discharge date: 05/03/2016  Admission Diagnoses:HNP lumbar right L5/S1  Discharge Diagnoses:  Active Problems:   HNP (herniated nucleus pulposus), lumbar   Discharged Condition: good  Hospital Course: Mr. Callander was taken to the operating room for an uncomplicated lumbar discetomy. He has voided, ambulated and tolerated a regular diet.  Treatments: surgery: right L5/S1 discetomy  Discharge Exam: Blood pressure (!) 168/99, pulse 74, temperature 98.1 F (36.7 C), temperature source Oral, resp. rate 16, height 6' (1.829 m), weight 88.2 kg (194 lb 7.1 oz), SpO2 100 %. General appearance: alert, cooperative, appears stated age and no distress  Wound is clean, dry, and without signs of infection Moving lower extremities well  Disposition: 01-Home or Self Care DISC DISPLACEMENT, LUMBAR  Allergies as of 05/03/2016      Reactions   Penicillins Anaphylaxis, Hives   Has patient had a PCN reaction causing immediate rash, facial/tongue/throat swelling, SOB or lightheadedness with hypotension: No Has patient had a PCN reaction causing severe rash involving mucus membranes or skin necrosis: No Has patient had a PCN reaction that required hospitalization No Has patient had a PCN reaction occurring within the last 10 years: No If all of the above answers are "NO", then may proceed with Cephalosporin use.      Medication List    TAKE these medications   albuterol 108 (90 Base) MCG/ACT inhaler Commonly known as:  PROVENTIL HFA;VENTOLIN HFA Inhale 2 puffs into the lungs every 6 (six) hours as needed for wheezing or shortness of breath. Shortness of breath   budesonide-formoterol 160-4.5 MCG/ACT inhaler Commonly known as:  SYMBICORT Inhale 2 puffs into the lungs 2 (two) times daily.   buPROPion 150 MG 24 hr tablet Commonly known as:  WELLBUTRIN XL Take 1 tablet  by mouth daily.   diclofenac 25 MG EC tablet Commonly known as:  VOLTAREN Take 25 mg by mouth daily.   doxycycline 100 MG tablet Commonly known as:  VIBRA-TABS Take 1 tablet (100 mg total) by mouth 2 (two) times daily.   guaiFENesin 600 MG 12 hr tablet Commonly known as:  MUCINEX Take 600 mg by mouth 4 (four) times daily as needed for to loosen phlegm.   ipratropium-albuterol 0.5-2.5 (3) MG/3ML Soln Commonly known as:  DUONEB Take 3 mLs by nebulization every 4 (four) hours as needed (shortness of breath and wheezing, coughing).   levofloxacin 750 MG tablet Commonly known as:  LEVAQUIN Take 1 tablet (750 mg total) by mouth daily.   lisinopril 10 MG tablet Commonly known as:  PRINIVIL,ZESTRIL Take 1 tablet (10 mg total) by mouth daily.   losartan 50 MG tablet Commonly known as:  COZAAR Take 50 mg by mouth daily.   LYRICA 50 MG capsule Generic drug:  pregabalin Take 50 mg by mouth 2 (two) times daily.   omeprazole 40 MG capsule Commonly known as:  PRILOSEC TAKE 1 CAPSULE BY MOUTH DAILY   OXYGEN Inhale 2.5 L into the lungs 2 (two) times daily as needed (use sduring day only as needed and wears every night). Patient states not used in 7 months   predniSONE 20 MG tablet Commonly known as:  DELTASONE Take 4 tablets by mouth once daily for 3 days, then take 3 tablets by mouth once daily for 3 days, then take 2 tablets by mouth once daily for 5 days, then take 1 tablet by mouth once daily for 4 days  predniSONE 20 MG tablet Commonly known as:  DELTASONE Take 3 daily for 5 days, 2 daily for 5 days, 1 daily for 5 days.   tetrahydrozoline 0.05 % ophthalmic solution Place 2 drops into both eyes daily.        Signed: Alaya Iverson L 05/03/2016, 7:22 PM

## 2016-05-03 NOTE — Anesthesia Postprocedure Evaluation (Addendum)
Anesthesia Post Note  Patient: KAHLEN MORAIS  Procedure(s) Performed: Procedure(s) (LRB): MICRODISCECTOMY LUMBAR FIVE- SACRAL ONE RIGHT (Right)  Patient location during evaluation: PACU Anesthesia Type: General Level of consciousness: sedated Pain management: pain level controlled Vital Signs Assessment: post-procedure vital signs reviewed and stable Respiratory status: spontaneous breathing and respiratory function stable Cardiovascular status: stable Anesthetic complications: no       Last Vitals:  Vitals:   05/03/16 1730 05/03/16 1745  BP: (!) 151/92 140/90  Pulse: 88 89  Resp: (!) 5 (!) 4  Temp:  36.6 C    Last Pain:  Vitals:   05/03/16 1658  TempSrc:   PainSc: Asleep    LLE Motor Response: Purposeful movement (05/03/16 1745)   RLE Motor Response: Purposeful movement (05/03/16 1745) RLE Sensation: No tingling;No pain;No numbness (05/03/16 1745)      [place]

## 2016-05-03 NOTE — H&P (Signed)
BP 131/81   Temp 98.6 F (37 C) (Oral)   Resp 20   SpO2 99%  [name] presents today for evaluation of pain that he has in his neck, numbness that he has in his hands, pain that he has in his shoulders, pain that he has in the right lower extremity, and numbness in the lateral aspect of his right foot. He says he has a burning sensation in the right lower extremity which goes from the calf into the foot. He has had injections into the lumbar spine performed by Dr. Jacelyn Grip, which had helped until the sixth injection, when he thinks something went wrong, and his leg started hurting there. He had a severe bout of pain which left him in bed for two weeks last year. He has been given multiple medications. He says a hot tub would help with regards to the lower back pain. He was told that he had nerve damage. He also reports tingling in the right upper extremity and all of his fingers for the last few months. He says he loses strength in the right upper extremity daily. He has a retrograde radiation of the tingling from his hand into the upper extremity. He had tried Lyrica previously with good success, but then eventually it stopped working. He has had to stop driving because of the numbness he experiences in his right foot. He is married to a young lady, [name] that works in the pathology office, and whom I know very well.  In his words, he has numbness in his right arm, hand, leg, foot, neck, and fingers, since June/2017. He has no idea what started the pain. He thought it was related to the injection the rest of the discomfort in his lower extremity. He says nothing will relieve his pain. His pain has gotten worse, he is not sure why. Weakness in the hands, arms, and legs, numbness and tingling in the neck, arm, hand, and fingers. He has headaches. He has had fairly steady weight between 187 and 191 pounds.  REVIEW OF SYSTEMS: Review of systems is positive for balance problems, chronic cough,  shortness of breath, leg pain with walking, chest pain, neck pain, arm weakness, leg weakness, back pain, leg pain, blurred vision, problems with coordination, and swollen lymph nodes. He says he has a spot in his lower back on the left side that bulges when he is hurting. PAST MEDICAL HISTORY: Past medical history is significant for hypertension, lung disease, chronic obstructive pulmonary disease, and gastrointestinal problems. PAST SURGICAL HISTORY: No previous operations. FAMILY HISTORY: Mother and father are both deceased, died at age 63. Lung disease, emphysema, and cancer are present in the family history. SOCIAL HISTORY: He is 66 years of age, weighs 180 pounds. He currently is disabled and not working. He is right-handed and a smoker, with over a 30 pack year history. He does drink alcohol socially. He does have a history of substance abuse. ALLERGIES: HE HAS AN ALLERGY TO PENICILLIN.  CURRENT MEDICATIONS: He takes Symbicort, Proventil, Wellbutrin, Voltaren, Mucinex, Losartan, Prilosec, Ipratropium/Albuterol.  PHYSICAL EXAMINATION: On exam he is alert, oriented x4, answering all questions appropriately. Memory, language, attention span, and fund of knowledge is normal. Speech is clear, it is also fluent. Hearing is intact to voice. Uvula elevates in the midline. Shoulder shrug is normal. Tongue protrudes in the midline. Normal strength in the upper extremities, maybe some slight weakness in the lower extremities in the hip flexion and hip extensor. Some of this  may be due to pain induced weakness and pain limited effort. Reflex is 2+ at the knees and left ankle. 2+ at biceps, triceps, brachioradialis. Proprioception is intact in the upper extremities. Gait is otherwise normal. Bilateral Tinel's sign on the right and left side over the transverse carpal ligament, and over the cubital tunnel in the right elbow. IMAGING STUDIES: MRI of the lumbar spine shows a large herniated disc at L5-S1 eccentric to  the right side. He has a degenerated disc at other levels of L3-4 and L4-5. He has a mild disc bulge at L4-5. In the cervical spine he has spondylitic change most prominent at C4-5 where he has some canal stenosis and bilateral foraminal stenosis. At other levels the left side stenosis at C5-6, U2-0 is present, certainly worse than what is present on the right side. Cord signal is normal throughout.  DIAGNOSES: Presumtive diagnoses: 1. HNP, 2. Right L5-S1 and right S1 radiculopathy, 3. Bilateral carpal tunnel syndrome, 4. Right ulnar neuropathy. 5. Right cervical osteoarthritis of the cervical spine with radiculopathy at C4-5.  I answered all of his questions with regards to the lumbar surgery

## 2016-05-03 NOTE — Transfer of Care (Signed)
Immediate Anesthesia Transfer of Care Note  Patient: Peter Sims  Procedure(s) Performed: Procedure(s): MICRODISCECTOMY LUMBAR FIVE- SACRAL ONE RIGHT (Right)  Patient Location: PACU  Anesthesia Type:General  Level of Consciousness: awake, alert , oriented and patient cooperative  Airway & Oxygen Therapy: Patient Spontanous Breathing and Patient connected to nasal cannula oxygen  Post-op Assessment: Report given to RN and Post -op Vital signs reviewed and stable  Post vital signs: Reviewed and stable  Last Vitals:  Vitals:   05/03/16 0859 05/03/16 1658  BP: 131/81 (!) 143/96  Pulse:  90  Resp: 20 20  Temp: 37 C 36.3 C    Last Pain:  Vitals:   05/03/16 1658  TempSrc:   PainSc: Asleep      Patients Stated Pain Goal: 8 (86/16/83 7290)  Complications: No apparent anesthesia complications

## 2016-05-03 NOTE — Anesthesia Preprocedure Evaluation (Signed)
Anesthesia Evaluation  Patient identified by MRN, date of birth, ID band Patient awake    Reviewed: Allergy & Precautions, NPO status , Patient's Chart, lab work & pertinent test results  History of Anesthesia Complications Negative for: history of anesthetic complications  Airway Mallampati: II  TM Distance: >3 FB Neck ROM: Full    Dental no notable dental hx. (+) Dental Advisory Given   Pulmonary COPD, Current Smoker,    Pulmonary exam normal        Cardiovascular hypertension, Normal cardiovascular exam     Neuro/Psych  Headaches, negative psych ROS   GI/Hepatic Neg liver ROS, GERD  ,  Endo/Other  negative endocrine ROS  Renal/GU negative Renal ROS     Musculoskeletal   Abdominal   Peds  Hematology negative hematology ROS (+)   Anesthesia Other Findings   Reproductive/Obstetrics                             Anesthesia Physical Anesthesia Plan  ASA: III  Anesthesia Plan:    Post-op Pain Management:    Induction: Intravenous  Airway Management Planned: Oral ETT  Additional Equipment:   Intra-op Plan:   Post-operative Plan: Extubation in OR  Informed Consent: I have reviewed the patients History and Physical, chart, labs and discussed the procedure including the risks, benefits and alternatives for the proposed anesthesia with the patient or authorized representative who has indicated his/her understanding and acceptance.   Dental advisory given  Plan Discussed with: CRNA, Anesthesiologist and Surgeon  Anesthesia Plan Comments:         Anesthesia Quick Evaluation

## 2016-05-03 NOTE — Addendum Note (Signed)
Addendum  created 05/03/16 1812 by Oletta Lamas, CRNA   Anesthesia Attestations filed, Anesthesia Event edited, Anesthesia Intra Flowsheets edited

## 2016-05-03 NOTE — Progress Notes (Signed)
Discharged instructions/education/AVS/Rx given to patient with wife at bedside and they both verbalized understanding. No swelling, no drainage, no redness noted on incision site. MAE well and ambulating well. Voiding with no issues, and tolerating dinner well. Patient discharged via wheelchair.

## 2016-05-03 NOTE — Op Note (Signed)
05/03/2016  7:26 PM  PATIENT:  DRAYTON TIEU  66 y.o. male  With right lower extremity pain secondary to a L5/S1 disc displacement. He has not improved with conservative treatment and wishes to proceed with operative therapy.  PRE-OPERATIVE DIAGNOSIS:  DISC DISPLACEMENT, LUMBAR L5/S1 right  POST-OPERATIVE DIAGNOSIS:  DISC DISPLACEMENT, LUMBAR L5/S1 right  PROCEDURE:  Procedure(s): MICRODISCECTOMY LUMBAR FIVE- SACRAL ONE RIGHT  SURGEON:   Surgeon(s): Ashok Pall, MD Newman Pies, MD  ASSISTANTS:Jenkins, Dellis Filbert  ANESTHESIA:   general  EBL:  No intake/output data recorded.  BLOOD ADMINISTERED:none  CELL SAVER GIVEN:none  COUNT:per nursing  DRAINS: none   SPECIMEN:  No Specimen  DICTATION: Mr. Manasco was taken to the operating room, intubated and placed under a general anesthetic without difficulty. He was positioned prone on a Wilson frame with all pressure points padded. His back was prepped and draped in a sterile manner. I opened the skin with a 10 blade and carried the dissection down to the thoracolumbar fascia. I used both sharp dissection and the monopolar cautery to expose the lamina of 5, and S1. I confirmed my location with an intraoperative xray.  I exposed the ligamentum flavum between L5 and S1. I opened the ligament with a 15 blade. I used the punches to remove the ligamentum flavum to expose the thecal sac. I brought the microscope into the operative field and with Dr.Jenkins' assistance we started our decompression of the spinal canal, thecal sac and Right S1 root(s). I cauterized epidural veins overlying the disc space then divided them sharply. I opened the disc space with a 15 blade and proceeded with the discectomy. I used pituitary rongeurs, curettes, and other instruments to remove disc material. After the discectomy was completed we inspected the S1 nerve root and felt it was well decompressed. I explored rostrally, laterally, medially, and caudally  and was satisfied with the decompression. I irrigated the wound, then closed in layers. I approximated the thoracolumbar fascia, subcutaneous, and subcuticular planes with vicryl sutures. I used dermabond for a sterile dressing.   PLAN OF CARE: Admit for overnight observation  PATIENT DISPOSITION:  PACU - hemodynamically stable.   Delay start of Pharmacological VTE agent (>24hrs) due to surgical blood loss or risk of bleeding:  yes

## 2016-05-03 NOTE — Progress Notes (Signed)
Patient complained of back and leg pain states 8 out of 10. Fentanyl 23mcg given IV at 1435. Patient states no relief, 50 mcg given at 1442, patient states he is feeling better, could not give score at this time.

## 2016-05-04 ENCOUNTER — Encounter (HOSPITAL_COMMUNITY): Payer: Self-pay | Admitting: Neurosurgery

## 2016-05-09 MED FILL — DICLOFENAC SOD 75 MG TAB EC: 75 | 30 days supply | Qty: 60 | Fill #0

## 2016-05-09 MED FILL — OMEPRAZOLE DR 40 MG CAPSULE: 40 | 90 days supply | Qty: 90 | Fill #1

## 2016-05-25 DIAGNOSIS — M5126 Other intervertebral disc displacement, lumbar region: Secondary | ICD-10-CM | POA: Diagnosis not present

## 2016-05-25 DIAGNOSIS — G5623 Lesion of ulnar nerve, bilateral upper limbs: Secondary | ICD-10-CM | POA: Diagnosis not present

## 2016-05-25 DIAGNOSIS — G5603 Carpal tunnel syndrome, bilateral upper limbs: Secondary | ICD-10-CM | POA: Diagnosis not present

## 2016-05-30 MED FILL — LOSARTAN POTASSIUM 50 MG TA: 50 | 90 days supply | Qty: 90 | Fill #1

## 2016-06-10 DIAGNOSIS — T8189XA Other complications of procedures, not elsewhere classified, initial encounter: Secondary | ICD-10-CM | POA: Diagnosis not present

## 2016-06-10 DIAGNOSIS — T8131XA Disruption of external operation (surgical) wound, not elsewhere classified, initial encounter: Secondary | ICD-10-CM | POA: Diagnosis not present

## 2016-07-27 MED FILL — SYMBICORT 160-4.5 MCG INH: 160-4.5 | 30 days supply | Qty: 10 | Fill #0

## 2016-07-27 MED FILL — VENTOLIN HFA 90 MCG INHALER: 108 (90 BAS | 17 days supply | Qty: 18 | Fill #0

## 2016-07-29 NOTE — Addendum Note (Signed)
Addendum  created 07/29/16 1057 by Duane Boston, MD   Sign clinical note

## 2016-09-11 DIAGNOSIS — Z716 Tobacco abuse counseling: Secondary | ICD-10-CM | POA: Diagnosis not present

## 2016-09-11 DIAGNOSIS — F17210 Nicotine dependence, cigarettes, uncomplicated: Secondary | ICD-10-CM | POA: Diagnosis not present

## 2016-09-11 DIAGNOSIS — M545 Low back pain: Secondary | ICD-10-CM | POA: Diagnosis not present

## 2016-09-11 MED FILL — predniSONE 10 MG TABS: 10 | 6 days supply | Qty: 21 | Fill #0

## 2016-09-12 DIAGNOSIS — H5213 Myopia, bilateral: Secondary | ICD-10-CM | POA: Diagnosis not present

## 2016-09-13 MED FILL — DICLOFENAC SOD 75 MG TAB EC: 75 | 30 days supply | Qty: 60 | Fill #0

## 2016-09-13 MED FILL — OMEPRAZOLE DR 40 MG CAPSULE: 40 | 90 days supply | Qty: 90 | Fill #0

## 2016-09-13 MED FILL — LOSARTAN POTASSIUM 50 MG TA: 50 | 90 days supply | Qty: 90 | Fill #0

## 2016-09-26 MED FILL — MONTELUKAST SOD 10 MG TAB: 10 | 30 days supply | Qty: 30 | Fill #0

## 2016-10-02 DIAGNOSIS — N182 Chronic kidney disease, stage 2 (mild): Secondary | ICD-10-CM | POA: Diagnosis not present

## 2016-10-02 DIAGNOSIS — Z125 Encounter for screening for malignant neoplasm of prostate: Secondary | ICD-10-CM | POA: Diagnosis not present

## 2016-10-02 DIAGNOSIS — Z716 Tobacco abuse counseling: Secondary | ICD-10-CM | POA: Diagnosis not present

## 2016-10-02 DIAGNOSIS — I129 Hypertensive chronic kidney disease with stage 1 through stage 4 chronic kidney disease, or unspecified chronic kidney disease: Secondary | ICD-10-CM | POA: Diagnosis not present

## 2016-10-02 DIAGNOSIS — I10 Essential (primary) hypertension: Secondary | ICD-10-CM | POA: Diagnosis not present

## 2016-10-02 DIAGNOSIS — R05 Cough: Secondary | ICD-10-CM | POA: Diagnosis not present

## 2016-10-02 DIAGNOSIS — J449 Chronic obstructive pulmonary disease, unspecified: Secondary | ICD-10-CM | POA: Diagnosis not present

## 2016-10-02 DIAGNOSIS — E785 Hyperlipidemia, unspecified: Secondary | ICD-10-CM | POA: Diagnosis not present

## 2016-10-02 DIAGNOSIS — R7309 Other abnormal glucose: Secondary | ICD-10-CM | POA: Diagnosis not present

## 2016-10-02 DIAGNOSIS — Z0000 Encounter for general adult medical examination without abnormal findings: Secondary | ICD-10-CM | POA: Diagnosis not present

## 2016-10-02 DIAGNOSIS — K5900 Constipation, unspecified: Secondary | ICD-10-CM | POA: Diagnosis not present

## 2016-10-02 MED FILL — AZITHROMYCIN 250 MG TABLET: 250 | 5 days supply | Qty: 6 | Fill #0

## 2016-10-17 DIAGNOSIS — Z6828 Body mass index (BMI) 28.0-28.9, adult: Secondary | ICD-10-CM | POA: Diagnosis not present

## 2016-10-17 DIAGNOSIS — E559 Vitamin D deficiency, unspecified: Secondary | ICD-10-CM | POA: Diagnosis not present

## 2016-10-17 DIAGNOSIS — J449 Chronic obstructive pulmonary disease, unspecified: Secondary | ICD-10-CM | POA: Diagnosis not present

## 2016-10-17 DIAGNOSIS — J441 Chronic obstructive pulmonary disease with (acute) exacerbation: Secondary | ICD-10-CM | POA: Diagnosis not present

## 2016-10-17 DIAGNOSIS — R7309 Other abnormal glucose: Secondary | ICD-10-CM | POA: Diagnosis not present

## 2016-10-17 MED FILL — VIT D2 1.25 MG (50,000 UNIT: 1.25 MG | 28 days supply | Qty: 8 | Fill #0

## 2016-10-24 DIAGNOSIS — J441 Chronic obstructive pulmonary disease with (acute) exacerbation: Secondary | ICD-10-CM | POA: Diagnosis not present

## 2016-10-26 DIAGNOSIS — J441 Chronic obstructive pulmonary disease with (acute) exacerbation: Secondary | ICD-10-CM | POA: Diagnosis not present

## 2016-10-31 DIAGNOSIS — Z6827 Body mass index (BMI) 27.0-27.9, adult: Secondary | ICD-10-CM | POA: Diagnosis not present

## 2016-10-31 DIAGNOSIS — I10 Essential (primary) hypertension: Secondary | ICD-10-CM | POA: Diagnosis not present

## 2016-10-31 DIAGNOSIS — M5126 Other intervertebral disc displacement, lumbar region: Secondary | ICD-10-CM | POA: Diagnosis not present

## 2016-11-01 ENCOUNTER — Other Ambulatory Visit (HOSPITAL_COMMUNITY): Payer: Self-pay | Admitting: Neurosurgery

## 2016-11-01 DIAGNOSIS — M5126 Other intervertebral disc displacement, lumbar region: Secondary | ICD-10-CM

## 2016-11-08 ENCOUNTER — Ambulatory Visit (HOSPITAL_COMMUNITY)
Admission: RE | Admit: 2016-11-08 | Discharge: 2016-11-08 | Disposition: A | Discharge status: Home / Self Care | Payer: 59 | Source: Ambulatory Visit | Attending: Neurosurgery | Admitting: Neurosurgery

## 2016-11-08 DIAGNOSIS — M5126 Other intervertebral disc displacement, lumbar region: Secondary | ICD-10-CM | POA: Diagnosis not present

## 2016-11-08 DIAGNOSIS — G9619 Other disorders of meninges, not elsewhere classified: Secondary | ICD-10-CM | POA: Diagnosis not present

## 2016-11-08 DIAGNOSIS — M47896 Other spondylosis, lumbar region: Secondary | ICD-10-CM | POA: Insufficient documentation

## 2016-11-08 DIAGNOSIS — M47816 Spondylosis without myelopathy or radiculopathy, lumbar region: Secondary | ICD-10-CM | POA: Diagnosis not present

## 2016-11-08 DIAGNOSIS — Z9889 Other specified postprocedural states: Secondary | ICD-10-CM | POA: Insufficient documentation

## 2016-11-08 LAB — CREATININE, SERUM
Creatinine, Ser: 1.22 mg/dL (ref 0.61–1.24)
GFR calc Af Amer: >60 mL/min (ref >60)

## 2016-11-08 MED ORDER — GADOBENATE DIMEGLUMINE 529 MG/ML IV SOLN
20.0000 mL | Freq: Once | INTRAVENOUS | Status: AC
  Administered 2016-11-08: 18 mL via INTRAVENOUS

## 2016-11-13 MED FILL — DICLOFENAC SOD 75 MG TAB EC: 75 | 30 days supply | Qty: 60 | Fill #0

## 2016-11-13 MED FILL — VIT D2 1.25 MG (50,000 UNIT: 1.25 MG | 28 days supply | Qty: 8 | Fill #1

## 2016-11-14 DIAGNOSIS — I10 Essential (primary) hypertension: Secondary | ICD-10-CM | POA: Diagnosis not present

## 2016-11-14 DIAGNOSIS — M5126 Other intervertebral disc displacement, lumbar region: Secondary | ICD-10-CM | POA: Diagnosis not present

## 2016-11-14 DIAGNOSIS — Z6827 Body mass index (BMI) 27.0-27.9, adult: Secondary | ICD-10-CM | POA: Diagnosis not present

## 2016-11-21 ENCOUNTER — Ambulatory Visit (INDEPENDENT_AMBULATORY_CARE_PROVIDER_SITE_OTHER): Payer: 59 | Admitting: Internal Medicine

## 2016-11-21 ENCOUNTER — Encounter: Payer: Self-pay | Admitting: Internal Medicine

## 2016-11-21 ENCOUNTER — Telehealth: Payer: Self-pay | Admitting: Internal Medicine

## 2016-11-21 VITALS — BP 130/80 | HR 78 | Ht 72.0 in | Wt 199.2 lb

## 2016-11-21 DIAGNOSIS — F17210 Nicotine dependence, cigarettes, uncomplicated: Secondary | ICD-10-CM | POA: Diagnosis not present

## 2016-11-21 DIAGNOSIS — I10 Essential (primary) hypertension: Secondary | ICD-10-CM | POA: Insufficient documentation

## 2016-11-21 DIAGNOSIS — J449 Chronic obstructive pulmonary disease, unspecified: Secondary | ICD-10-CM | POA: Diagnosis not present

## 2016-11-21 MED ORDER — IRBESARTAN 75 MG PO TABS: 75.0000 mg | ORAL_TABLET | Freq: Every day | ORAL | 11 refills | Status: DC

## 2016-11-21 MED ORDER — BUDESONIDE-FORMOTEROL FUMARATE 160-4.5 MCG/ACT IN AERO: 2.0000 | INHALATION_SPRAY | Freq: Two times a day (BID) | RESPIRATORY_TRACT | 11 refills | Status: DC

## 2016-11-21 MED ORDER — BUDESONIDE-FORMOTEROL FUMARATE 160-4.5 MCG/ACT IN AERO: 2.0000 | INHALATION_SPRAY | Freq: Two times a day (BID) | RESPIRATORY_TRACT | 0 refills | Status: DC

## 2016-11-21 MED ORDER — PREDNISONE 10 MG PO TABS: ORAL_TABLET | ORAL | 0 refills | Status: DC

## 2016-11-21 MED FILL — predniSONE 10 MG TABS: 10 | 6 days supply | Qty: 14 | Fill #0

## 2016-11-21 MED FILL — SYMBICORT 160-4.5 MCG INH: 160-4.5 | 30 days supply | Qty: 10 | Fill #1

## 2016-11-21 MED FILL — IRBESARTAN 75 MG TABLET: 75 | 30 days supply | Qty: 30 | Fill #0

## 2016-11-21 NOTE — Telephone Encounter (Signed)
Patient is aware of PFT scheduled on 01/02/2017. Nothing Further is needed.

## 2016-11-21 NOTE — Patient Instructions (Addendum)
Stop prinivil and losartan and start avapro 75 mg daily  - take twice daily if bp not adequate  Continue omeprazole 40 mg Take 30-60 min before first meal of the day   Prednisone 10 mg take  4 each am x 2 days,   2 each am x 2 days,  1 each am x 2 days and stop   Plan A = Automatic = Symbicort 160 Take 2 puffs first thing in am and then another 2 puffs about 12 hours later.     Work on inhaler technique:  relax and gently blow all the way out then take a nice smooth deep breath back in, triggering the inhaler at same time you start breathing in.  Hold for up to 5 seconds if you can. Blow out thru nose. Rinse and gargle with water when done      Plan B = Backup Only use your albuterol (ventolin) as a rescue medication to be used if you can't catch your breath by resting or doing a relaxed purse lip breathing pattern.  - The less you use it, the better it will work when you need it. - Ok to use the inhaler up to 2 puffs  every 4 hours if you must but call for appointment if use goes up over your usual need - Don't leave home without it !!  (think of it like the spare tire for your car)   Plan C = Crisis - only use your albuterol nebulizer if you first try Plan B and it fails to help > ok to use the nebulizer up to every 4 hours but if start needing it regularly call for immediate appointment   The key is to stop smoking completely before smoking completely stops you!    Please remember to go to the  x-ray department downstairs in the basement  for your tests - we will call you with the results when they are available.      Please schedule a follow up office visit in 6 weeks, call sooner if needed for PFTs and bring all meds/ inhalers with you

## 2016-11-21 NOTE — Progress Notes (Signed)
Subjective:     [name], male   DOB: Jul 02, 1950,    MRN: [id number]  HPI  89 yowm active smoker on disability for back problems and copd most recently under care of Mohammed in [facility] but referred to pulmonary clinic 11/21/2016 by Dr   Bryon Lions - seen previously by Dr Joya Gaskins with dx of GOLD III copd though no pfts on file in epic     11/21/2016 1st [facility] office visit/ Sebastian Lurz  ons symbicort/ acei since at least 09/30/11 / still smoking Chief Complaint  Patient presents with  . Advice Only    Referred by Dr. Baird Cancer for COPD. Pt used to see Dr. Joya Gaskins in 2013 and was looking for a new pulmonary doctor. C/o prod. cough with clear to yellow mucus and wheezing, SOB, and CP  last baseline was in June/july doe x slow pace  walking dog some hills / maint sym160 2 bid / singulair hs on neb once or twice a week/ the rescue inhaler maybe 3 x weekly/ then worse sob/cough with yellow mucus since  Aug 2018 p exp to paint so eval by Bryon Lions rec abx> turned mucus white  but needing neb twice daily still.  Has some hb/ not taking ppi regularly ac    No obvious day to day or daytime variability or assoc excess/ purulent sputum or mucus plugs or hemoptysis or cp or chest tightness,   or overt sinus   symptoms. No unusual exp hx or h/o childhood pna/ asthma or knowledge of premature birth.  Sleeping ok flat without nocturnal  or early am exacerbation  of respiratory  c/o's or need for noct saba. Also denies any obvious fluctuation of symptoms with weather or environmental changes or other aggravating or alleviating factors except as outlined above   Current Allergies, Complete Past Medical History, Past Surgical History, Family History, and Social History were reviewed in [employer] record.  ROS  The following are not active complaints unless bolded sore throat, dysphagia, dental problems, itching, sneezing,  nasal congestion or disharge of excess  mucus or purulent secretions, ear ache,   fever, chills, sweats, unintended wt loss or wt gain, classically pleuritic or exertional cp,  orthopnea pnd or leg swelling, presyncope, palpitations, abdominal pain, anorexia, nausea, vomiting, diarrhea  or change in bowel habits or bladder habits, change in stools or change in urine, dysuria, hematuria,  rash, arthralgias, visual complaints, headache, numbness, weakness or ataxia or problems with walking or coordination,  change in mood/affect or memory.        Current Meds  Medication Sig  . albuterol (PROVENTIL HFA;VENTOLIN HFA) 108 (90 BASE) MCG/ACT inhaler Inhale 2 puffs into the lungs every 6 (six) hours as needed for wheezing or shortness of breath. Shortness of breath  . budesonide-formoterol (SYMBICORT) 160-4.5 MCG/ACT inhaler Inhale 2 puffs into the lungs 2 (two) times daily.  Marland Kitchen buPROPion (WELLBUTRIN XL) 150 MG 24 hr tablet Take 1 tablet by mouth daily.  . cyclobenzaprine (FLEXERIL) 10 MG tablet Take 10 mg by mouth as needed for muscle spasms.  . diclofenac (VOLTAREN) 25 MG EC tablet Take 25 mg by mouth daily.  Marland Kitchen guaiFENesin (MUCINEX) 600 MG 12 hr tablet Take 600 mg by mouth 4 (four) times daily as needed for to loosen phlegm.  Marland Kitchen ipratropium-albuterol (DUONEB) 0.5-2.5 (3) MG/3ML SOLN Take 3 mLs by nebulization every 4 (four) hours as needed (shortness of breath and wheezing, coughing).  Marland Kitchen LYRICA 50 MG capsule  Take 50 mg by mouth 2 (two) times daily.   . montelukast (SINGULAIR) 10 MG tablet every evening.  Marland Kitchen omeprazole (PRILOSEC) 40 MG capsule TAKE 1 CAPSULE BY MOUTH DAILY  . tetrahydrozoline 0.05 % ophthalmic solution Place 2 drops into both eyes daily.  . Vitamin D, Ergocalciferol, (DRISDOL) 50000 units CAPS capsule Take 50,000 Units by mouth 2 (two) times a week.  . [DISCONTINUED] doxycycline (VIBRA-TABS) 100 MG tablet Take 1 tablet (100 mg total) by mouth 2 (two) times daily.  . [  losartan (COZAAR) 50 MG tablet Take 50 mg by mouth daily.         Review of Systems     Objective:   Physical Exam  amb wm nad   Wt Readings from Last 3 Encounters:  11/21/16 199 lb 3.2 oz (90.4 kg)  05/03/16 194 lb 7.1 oz (88.2 kg)  04/26/16 193 lb 1.6 oz (87.6 kg)    Vital signs reviewed - Note on arrival 02 sats  98% on RA and bp 130/80      HEENT: nl dentition, turbinates bilaterally, and oropharynx. Nl external ear canals without cough reflex   NECK :  without JVD/Nodes/TM/ nl carotid upstrokes bilaterally   LUNGS: no acc muscle use,  Mild/ moderate barrel contour with insp and exp rhonchi and prominent upper airway wheezing    CV:  RRR  no s3 or murmur or increase in P2, and no edema   ABD:  soft and nontender with nl inspiratory excursion in the supine position. No bruits or organomegaly appreciated, bowel sounds nl  MS:  Nl gait/ ext warm without deformities, calf tenderness, cyanosis or clubbing No obvious joint restrictions   SKIN: warm and dry without lesions    NEURO:  alert, approp, nl sensorium with  no motor or cerebellar deficits apparent.     CXR PA and Lateral:   11/21/2016 :    I personally reviewed images and agree with radiology impression as follows:    did not go as req     Assessment:

## 2016-11-21 NOTE — Telephone Encounter (Signed)
Called pt to inform him of his follow-up appt with the PFT but no answer. Left message for pt to call us back to inform him of his appt.

## 2016-11-22 ENCOUNTER — Ambulatory Visit (INDEPENDENT_AMBULATORY_CARE_PROVIDER_SITE_OTHER)
Admission: RE | Admit: 2016-11-22 | Discharge: 2016-11-22 | Disposition: A | Discharge status: Home / Self Care | Payer: 59 | Source: Ambulatory Visit | Attending: Internal Medicine | Admitting: Internal Medicine

## 2016-11-22 DIAGNOSIS — J449 Chronic obstructive pulmonary disease, unspecified: Secondary | ICD-10-CM | POA: Diagnosis not present

## 2016-11-22 DIAGNOSIS — R05 Cough: Secondary | ICD-10-CM | POA: Diagnosis not present

## 2016-11-22 NOTE — Progress Notes (Signed)
Spoke with pt and notified of results per Dr. Wert. Pt verbalized understanding and denied any questions. 

## 2016-11-22 NOTE — Assessment & Plan Note (Signed)
2013 entry: Gold  III Copd with Bullous emphysema, pt with a large LUL bullae localized.  ? If resection would improve function  11/21/2016  After extensive coaching HFA effectiveness =    75% so continue symbicort 160 2bid   Clearly his copd is poorly controlled at this point. DDX of  difficult airways management almost all start with A and  include Adherence, Ace Inhibitors, Acid Reflux, Active Sinus Disease, Alpha 1 Antitripsin deficiency, Anxiety masquerading as Airways dz,  ABPA,  Allergy(esp in young), Aspiration (esp in elderly), Adverse effects of meds,  Active smokers, A bunch of PE's (a small clot burden can't cause this syndrome unless there is already severe underlying pulm or vascular dz with poor reserve) plus two Bs  = Bronchiectasis and Beta blocker use..and one C= CHF   Adherence is always the initial "prime suspect" and is a multilayered concern that requires a "trust but verify" approach in every patient - starting with knowing how to use medications, especially inhalers, correctly, keeping up with refills and understanding the fundamental difference between maintenance and prns vs those medications only taken for a very short course and then stopped and not refilled.  - does not know his meds well - return with all meds in hand using a trust but verify approach to confirm accurate Medication  Reconciliation The principal here is that until we are certain that the  patients are doing what we've asked, it makes no sense to ask them to do more.  - see hfa teaching   Active smoking (see separate a/p)   ACEi adverse effects at the  top of the usual list of suspects and the only way to rule it out is a trial off > see a/p   ? Allergy/ asthma > high dose symb should help/ ok to continue singulair also for now and give short pred burst = Prednisone 10 mg take  4 each am x 2 days,   2 each am x 2 days,  1 each am x 2 days and stop   ? Acid (or non-acid) GERD > always difficult to exclude  as up to 75% of pts in some series report no assoc GI/ Heartburn symptoms> rec continue max (24h)  acid suppression and diet restrictions/ reviewed      Will see back in 6 weeks for pfts    Total time devoted to counseling  > 50 % of initial 60 min office visit:  review case with pt/ discussion of options/alternatives/ personally creating written customized instructions  in presence of pt  then going over those specific  Instructions directly with the pt including how to use all of the meds but in particular covering each new medication in detail and the difference between the maintenance= "automatic" meds and the prns using an action plan format for the latter (If this problem/symptom => do that organization reading Left to right).  Please see AVS from this visit for a full list of these instructions which I personally wrote for this pt and  are unique to this visit.

## 2016-11-22 NOTE — Assessment & Plan Note (Signed)
In the best review of chronic cough to date ( NEJM 2016 375 [id number]) ,  ACEi are now felt to cause cough in up to  20% of pts which is a 4 fold increase from previous reports and does not include the variety of non-specific complaints we see in pulmonary clinic in pts on ACEi but previously attributed to another dx like  Copd/asthma and  include PNDS, throat and chest congestion, "bronchitis", unexplained dyspnea and noct "strangling" sensations, and hoarseness, but also  atypical /refractory GERD symptoms like dysphagia and "bad heartburn"   The only way I know  to prove this is not an "ACEi Case" is a trial off ACEi x a minimum of 6 weeks then regroup.   Try avapro 75 mg daily and take twice a day if bp not well controlled

## 2016-11-22 NOTE — Assessment & Plan Note (Signed)

## 2016-12-21 MED FILL — IRBESARTAN 75 MG TABLET: 75 | 30 days supply | Qty: 30 | Fill #1

## 2017-01-02 ENCOUNTER — Ambulatory Visit: Payer: 59 | Admitting: Internal Medicine

## 2017-01-05 MED FILL — IPRAT-ALBUT 0.5-3(2.5) MG/3: 0.5-2.5 (3) | 30 days supply | Qty: 360 | Fill #1

## 2017-01-05 MED FILL — CYCLOBENZAPRINE 10 MG TAB: 10 | 20 days supply | Qty: 60 | Fill #0

## 2017-01-05 MED FILL — OMEPRAZOLE DR 40 MG CAPSULE: 40 | 90 days supply | Qty: 90 | Fill #1

## 2017-01-05 MED FILL — DICLOFENAC SODIUM 75 MG TAB: 75 | 30 days supply | Qty: 60 | Fill #0

## 2017-01-30 MED FILL — IRBESARTAN 75 MG TABLET: 75 | 30 days supply | Qty: 30 | Fill #2

## 2017-02-02 ENCOUNTER — Ambulatory Visit: Payer: 59 | Admitting: Internal Medicine

## 2017-02-02 ENCOUNTER — Encounter: Payer: Self-pay | Admitting: Internal Medicine

## 2017-02-02 ENCOUNTER — Ambulatory Visit (INDEPENDENT_AMBULATORY_CARE_PROVIDER_SITE_OTHER): Payer: 59 | Admitting: Internal Medicine

## 2017-02-02 VITALS — BP 124/78 | HR 94 | Ht 72.0 in | Wt 203.0 lb

## 2017-02-02 DIAGNOSIS — J449 Chronic obstructive pulmonary disease, unspecified: Secondary | ICD-10-CM

## 2017-02-02 DIAGNOSIS — R0602 Shortness of breath: Secondary | ICD-10-CM | POA: Diagnosis not present

## 2017-02-02 DIAGNOSIS — R062 Wheezing: Secondary | ICD-10-CM | POA: Diagnosis not present

## 2017-02-02 DIAGNOSIS — F17210 Nicotine dependence, cigarettes, uncomplicated: Secondary | ICD-10-CM

## 2017-02-02 DIAGNOSIS — R053 Chronic cough: Secondary | ICD-10-CM | POA: Insufficient documentation

## 2017-02-02 DIAGNOSIS — I10 Essential (primary) hypertension: Secondary | ICD-10-CM | POA: Diagnosis not present

## 2017-02-02 DIAGNOSIS — R0603 Acute respiratory distress: Secondary | ICD-10-CM | POA: Diagnosis not present

## 2017-02-02 DIAGNOSIS — R05 Cough: Secondary | ICD-10-CM

## 2017-02-02 DIAGNOSIS — J439 Emphysema, unspecified: Secondary | ICD-10-CM

## 2017-02-02 DIAGNOSIS — R059 Cough, unspecified: Secondary | ICD-10-CM

## 2017-02-02 LAB — PULMONARY FUNCTION TEST
DL/VA % PRED: 81 %
DL/VA: 3.87 ml/min/mmHg/L
DLCO UNC % PRED: 55 %
DLCO UNC: 19.27 ml/min/mmHg
DLCO cor % pred: 56 %
DLCO cor: 19.92 ml/min/mmHg
FEF 25-75 PRE: 1.57 L/s
FEF 25-75 Post: 2.56 L/sec
FEF2575-%CHANGE-POST: 62 %
FEF2575-%PRED-POST: 87 %
FEF2575-%PRED-PRE: 54 %
FEV1-%Change-Post: 12 %
FEV1-%PRED-POST: 82 %
FEV1-%Pred-Pre: 74 %
FEV1-Post: 2.71 L
FEV1-Pre: 2.41 L
FEV1FVC-%CHANGE-POST: 2 %
FEV1FVC-%Pred-Pre: 94 %
FEV6-%CHANGE-POST: 9 %
FEV6-%Pred-Post: 88 %
FEV6-%Pred-Pre: 80 %
FEV6-PRE: 3.28 L
FEV6-Post: 3.61 L
FEV6FVC-%Change-Post: 0 %
FEV6FVC-%PRED-PRE: 102 %
FEV6FVC-%Pred-Post: 103 %
FVC-%Change-Post: 8 %
FVC-%Pred-Post: 85 %
FVC-%Pred-Pre: 78 %
FVC-Post: 3.63 L
FVC-Pre: 3.33 L
PRE FEV1/FVC RATIO: 72 %
Post FEV1/FVC ratio: 75 %
Post FEV6/FVC ratio: 100 %
Pre FEV6/FVC Ratio: 99 %
RV % pred: 74 %
RV: 1.82 L
TLC % pred: 71 %
TLC: 5.32 L

## 2017-02-02 LAB — NITRIC OXIDE: NITRIC OXIDE: 5

## 2017-02-02 MED ORDER — FAMOTIDINE 20 MG PO TABS: ORAL_TABLET | ORAL | 11 refills | Status: DC

## 2017-02-02 MED ORDER — BUDESONIDE-FORMOTEROL FUMARATE 80-4.5 MCG/ACT IN AERO: 2.0000 | INHALATION_SPRAY | Freq: Two times a day (BID) | RESPIRATORY_TRACT | 11 refills | Status: DC

## 2017-02-02 MED ORDER — BUDESONIDE-FORMOTEROL FUMARATE 80-4.5 MCG/ACT IN AERO: 2.0000 | INHALATION_SPRAY | Freq: Two times a day (BID) | RESPIRATORY_TRACT | 0 refills | Status: DC

## 2017-02-02 MED ORDER — FLUTTER DEVI: 0 refills | Status: AC

## 2017-02-02 NOTE — Progress Notes (Signed)
PFT completed today by June Leap.

## 2017-02-02 NOTE — Patient Instructions (Addendum)
You do not meet critieria for COPD so we will list this as COPD GOLD 0 - this means you are at risk from smoking and you should stop smoking now rather than let it develop COPD later  Plan A = Automatic = symbicort 80 Take 2 puffs first thing in am and then another 2 puffs about 12 hours later.    Work on inhaler technique:  relax and gently blow all the way out then take a nice smooth deep breath back in, triggering the inhaler at same time you start breathing in.  Hold for up to 5 seconds if you can. Blow out thru nose. Rinse and gargle with water when done   For cough/congestion > mucinex dm up to 1200 mg every 12 hours and use the flutter valve as much as possible       Plan B = Backup Only use your albuterol as a rescue medication to be used if you can't catch your breath by resting or doing a relaxed purse lip breathing pattern.  - The less you use it, the better it will work when you need it. - Ok to use the inhaler up to 2 puffs  every 4 hours if you must but call for appointment if use goes up over your usual need - Don't leave home without it !!  (think of it like the spare tire for your car)   Plan C = Crisis - only use your albuterol nebulizer if you first try Plan B and it fails to help > ok to use the nebulizer up to every 4 hours but if start needing it regularly call for immediate appointment   Please see patient coordinator before you leave today  to schedule sinus and HRCT chest    Please schedule a follow up office visit in 6 weeks, call sooner if needed with all medications /inhalers/ solutions in hand so we can verify exactly what you are taking. This includes all medications from all doctors and over the counters

## 2017-02-02 NOTE — Progress Notes (Signed)
Subjective:     [name], male   DOB: March 10, 1950,    MRN: [id number]    Brief patient profile:  70 yowm active smoker on disability for back problems and copd most recently under care of Mohammed in [place] but referred to pulmonary clinic 11/21/2016 by Dr   Bryon Lions - seen previously by Dr Joya Gaskins with dx of   copd though no pfts on file in epic and proved to have GOLD 0 criteria 02/02/2017    History of Present Illness 11/21/2016 1st [facility] office visit/ [name]  On  symbicort/ acei since at least 09/30/11 / still smoking Chief Complaint  Patient presents with  . Advice Only    Referred by Dr. Baird Cancer for COPD. Pt used to see Dr. Joya Gaskins in 2013 and was looking for a new pulmonary doctor. C/o prod. cough with clear to yellow mucus and wheezing, SOB, and CP  last baseline was in June/july doe x slow pace  walking dog some hills / maint sym160 2 bid / singulair hs on neb once or twice a week/ the rescue inhaler maybe 3 x weekly/ then worse sob/cough with yellow mucus since  Aug 2018 p exp to paint so eval by Bryon Lions rec abx> turned mucus white  but needing neb twice daily still. Has some hb/ not taking ppi regularly ac  rec Stop prinivil and losartan and start avapro 75 mg daily  - take twice daily if bp not adequate Continue omeprazole 40 mg Take 30-60 min before first meal of the day  Prednisone 10 mg take  4 each am x 2 days,   2 each am x 2 days,  1 each am x 2 days and stop  Plan A = Automatic = Symbicort 160 Take 2 puffs first thing in am and then another 2 puffs about 12 hours later.  Work on inhaler technique:  relax and gently blow all the way out then take a nice smooth deep breath back in, triggering the inhaler at same time you start breathing in.  Hold for up to 5 seconds if you can. Blow out thru nose. Rinse and gargle with water when done Plan B = Backup Only use your albuterol (ventolin) as a rescue medication Plan C = Crisis - only use your  albuterol nebulizer if you first try Plan B and it fails to help > ok to use the nebulizer up to every 4 hours but if start needing it regularly call for immediate appointment The key is to stop smoking completely before smoking completely stops you!  Please remember to go to the  x-ray department downstairs in the basement  for your tests - we will call you with the results when they are available. Please schedule a follow up office visit in 6 weeks, call sooner if needed for PFTs and bring all meds/ inhalers with you      02/02/2017  Extended f/u ov/Willaim Mode re:  No better/ did not bring meds as requested/ still over using saba multiple forms  Chief Complaint  Patient presents with  . Follow-up    PFT's done today. Pt states that his breathing is unchanged. He is using his albuterol inhalerand his neb at least once every day, usually at night.   since 2013  Cough/ wheeze/ sob - does get better for a few days from time to time but "I don't keep a diary" and he can't see an obvious pattern for why/ when his breathing  is "better" but sill uses saba daily Cough is daily feature worse after supper / nebulizer seems to help and use at bedtime and wakes up at nl hour  wheezy / congested When he's feeling better says he can play kick ball fine but overall activity level quite low    No obvious patterns in day to day or daytime variability or assoc excess/ purulent sputum or mucus plugs or hemoptysis or cp or chest tightness,   or overt sinus or hb symptoms. No unusual exposure hx or h/o childhood pna/ asthma or knowledge of premature birth.  Sleeping ok flat without nocturnal   exacerbation  of respiratory  c/o's or need for noct saba. Also denies any obvious fluctuation of symptoms with weather or environmental changes or other aggravating or alleviating factors except as outlined above   Current Allergies, Complete Past Medical History, Past Surgical History, Family History, and Social History were  reviewed in [employer] record.  ROS  The following are not active complaints unless bolded Hoarseness, sore throat, dysphagia, dental problems, itching, sneezing,  nasal congestion or discharge of excess mucus or purulent secretions, ear ache,   fever, chills, sweats, unintended wt loss or wt gain, classically pleuritic or exertional cp,  orthopnea pnd or leg swelling, presyncope, palpitations, abdominal pain, anorexia, nausea, vomiting, diarrhea  or change in bowel habits or change in bladder habits, change in stools or change in urine, dysuria, hematuria,  rash, arthralgias, visual complaints, headache, numbness, weakness or ataxia or problems with walking or coordination,  change in mood/affect or memory.        Current Meds  Medication Sig  . albuterol (PROVENTIL HFA;VENTOLIN HFA) 108 (90 BASE) MCG/ACT inhaler Inhale 2 puffs into the lungs every 6 (six) hours as needed for wheezing or shortness of breath. Shortness of breath  . buPROPion (WELLBUTRIN XL) 150 MG 24 hr tablet Take 1 tablet by mouth daily.  . cyclobenzaprine (FLEXERIL) 10 MG tablet Take 10 mg by mouth as needed for muscle spasms.  . diclofenac (VOLTAREN) 25 MG EC tablet Take 25 mg by mouth daily.  Marland Kitchen guaiFENesin (MUCINEX) 600 MG 12 hr tablet Take 600 mg by mouth 4 (four) times daily as needed for to loosen phlegm.  Marland Kitchen ipratropium-albuterol (DUONEB) 0.5-2.5 (3) MG/3ML SOLN Take 3 mLs by nebulization every 4 (four) hours as needed (shortness of breath and wheezing, coughing).  . irbesartan (AVAPRO) 75 MG tablet Take 1 tablet (75 mg total) by mouth daily.  Marland Kitchen LYRICA 50 MG capsule Take 50 mg by mouth 2 (two) times daily.   . montelukast (SINGULAIR) 10 MG tablet every evening.  Marland Kitchen omeprazole (PRILOSEC) 40 MG capsule TAKE 1 CAPSULE BY MOUTH DAILY  . Vitamin D, Ergocalciferol, (DRISDOL) 50000 units CAPS capsule Take 50,000 Units by mouth 2 (two) times a week.  .  budesonide-formoterol (SYMBICORT) 160-4.5 MCG/ACT  inhaler Inhale 2 puffs into the lungs 2 (two) times daily.                 Objective:   Physical Exam    Amb anxious wm who  failed to answer  questions asked in a straightforward manner, tending to go off on tangents or answer questions with ambiguous medical terms or diagnoses and seemed aggravated/ talking in very loud voice   when asked the same question more than once for clarification.     02/02/2017       203   11/21/16 199 lb 3.2 oz (90.4 kg)  05/03/16  194 lb 7.1 oz (88.2 kg)  04/26/16 193 lb 1.6 oz (87.6 kg)     Vital signs reviewed - Note on arrival 02 sats  97 % on  RA          LUNGS: no acc muscle use,  Mild/ moderate barrel contour with insp and exp rhonchi and prominent upper airway wheezing    HEENT: nl  turbinates bilaterally, and oropharynx. Nl external ear canals without cough reflex - top denture   NECK :  without JVD/Nodes/TM/ nl carotid upstrokes bilaterally   LUNGS: no acc muscle use,  Nl contour chest which is clear except for prominent pseudowheeze resolved with PLM / panting    CV:  RRR  no s3 or murmur or increase in P2, and no edema   ABD:  soft and nontender with nl inspiratory excursion in the supine position. No bruits or organomegaly appreciated, bowel sounds nl  MS:  Nl gait/ ext warm without deformities, calf tenderness, cyanosis or clubbing No obvious joint restrictions   SKIN: warm and dry without lesions    NEURO:  alert, approp, nl sensorium with  no motor or cerebellar deficits apparent.                Assessment:

## 2017-02-03 ENCOUNTER — Encounter: Payer: Self-pay | Admitting: Internal Medicine

## 2017-02-03 NOTE — Assessment & Plan Note (Addendum)
D/c ACEi re 11/21/16 (unable to confirm when took last dose) FENO 02/02/2017  =   5 so not likely eos driven and ok to reduce the symb from 160 to 80 which may help the upper airway component of the cough   Lack of cough resolution on a verified empirical regimen (not able to do as pt did not bring back meds as requested  could mean an alternative diagnosis, persistence of the disease state (eg sinusitis or bronchiectasis) , or inadequacy of currently available therapy (eg no medical rx available for non-acid gerd)   rec  Continue ppi Sinus CT 02/02/2017 >>>  HRCT Chest 02/02/2017  >>>

## 2017-02-03 NOTE — Assessment & Plan Note (Signed)
2013 Dr Joya Gaskins entry: Copd with Bullous emphysema, pt with a large LUL bulla -  PFT's  02/02/2017  FEV1 2.71  (82 % ) ratio 75  p 12 % improvement from saba p nothing prior to study with DLCO  55/56 % corrects to 81  % for alv volume   - 02/02/2017  After extensive coaching HFA effectiveness =   75% from a baseline of 50% (delayed trigger) > try reduce symb to 80 bid to rx AB s aggravatign the uacs component   Despite active smoking and suboptimal hfa he actually does not meet criteria for copd though I have no doubt there will be some emphysematous changes on HRCT that is needed to sort out cause of cough (see cough a/p)    for now needs to stay off the acei, verify meds at each ov and reduce dep on saba if at all possible.   I had an extended discussion with the patient reviewing all relevant studies completed to date and  lasting 25 minutes of a 40  minute office  visit addressing  severe non-specific but potentially very serious refractory respiratory symptoms of uncertain and potentially multiple  etiologies.   Each maintenance medication was reviewed in detail including most importantly the difference between maintenance and prns and under what circumstances the prns are to be triggered using an action plan format that is not reflected in the computer generated alphabetically organized AVS.    Please see AVS for specific instructions unique to this office visit that I personally wrote and verbalized to the the pt in detail and then reviewed with pt  by my nurse highlighting any changes in therapy/plan of care  recommended at today's visit.

## 2017-02-03 NOTE — Assessment & Plan Note (Signed)

## 2017-02-03 NOTE — Assessment & Plan Note (Addendum)
rec change acei to ARB 11/21/16   Adequate control on present rx, reviewed in detail with pt > no change in rx needed    Although even in retrospect it may not be clear the ACEi contributed to the pt's symptoms,  Pt improved off them and adding them back at this point or in the future would risk confusion in interpretation of non-specific respiratory symptoms to which this patient is prone  ie  Better not to muddy the waters here.

## 2017-02-03 NOTE — Assessment & Plan Note (Signed)
He has structural changes that are c/w emphysema but note excellent air flow and dlco that corrects to nl with best days able to play kickball which is not c/w emphysema which doesn't cause the cough or the wheezing or the variable activity tolerance he describes and may at this point be a moot issue.

## 2017-02-15 ENCOUNTER — Inpatient Hospital Stay: Admission: RE | Admit: 2017-02-15 | Payer: 59 | Source: Ambulatory Visit

## 2017-03-02 DIAGNOSIS — Z720 Tobacco use: Secondary | ICD-10-CM | POA: Diagnosis not present

## 2017-03-02 DIAGNOSIS — W11XXXA Fall on and from ladder, initial encounter: Secondary | ICD-10-CM | POA: Diagnosis not present

## 2017-03-02 DIAGNOSIS — M79661 Pain in right lower leg: Secondary | ICD-10-CM | POA: Diagnosis not present

## 2017-03-02 DIAGNOSIS — M25552 Pain in left hip: Secondary | ICD-10-CM | POA: Diagnosis not present

## 2017-03-02 MED FILL — predniSONE 10 MG TABS: 10 | 6 days supply | Qty: 21 | Fill #0

## 2017-03-02 MED FILL — IRBESARTAN 75 MG TABLET: 75 | 30 days supply | Qty: 30 | Fill #3

## 2017-03-02 MED FILL — CYCLOBENZAPRINE 10 MG TAB: 10 | 20 days supply | Qty: 60 | Fill #0

## 2017-04-03 ENCOUNTER — Other Ambulatory Visit (HOSPITAL_COMMUNITY): Payer: Self-pay | Admitting: Internal Medicine

## 2017-04-03 ENCOUNTER — Ambulatory Visit (HOSPITAL_COMMUNITY)
Admission: RE | Admit: 2017-04-03 | Discharge: 2017-04-03 | Disposition: A | Discharge status: Home / Self Care | Payer: 59 | Source: Ambulatory Visit | Attending: Internal Medicine | Admitting: Internal Medicine

## 2017-04-03 DIAGNOSIS — S79912A Unspecified injury of left hip, initial encounter: Secondary | ICD-10-CM | POA: Diagnosis not present

## 2017-04-03 DIAGNOSIS — W19XXXA Unspecified fall, initial encounter: Secondary | ICD-10-CM | POA: Insufficient documentation

## 2017-04-03 DIAGNOSIS — M25552 Pain in left hip: Secondary | ICD-10-CM | POA: Insufficient documentation

## 2017-04-04 DIAGNOSIS — M62830 Muscle spasm of back: Secondary | ICD-10-CM | POA: Diagnosis not present

## 2017-04-04 DIAGNOSIS — I10 Essential (primary) hypertension: Secondary | ICD-10-CM | POA: Diagnosis not present

## 2017-04-04 DIAGNOSIS — E785 Hyperlipidemia, unspecified: Secondary | ICD-10-CM | POA: Diagnosis not present

## 2017-04-04 DIAGNOSIS — K219 Gastro-esophageal reflux disease without esophagitis: Secondary | ICD-10-CM | POA: Diagnosis not present

## 2017-04-04 LAB — HEPATIC FUNCTION PANEL
ALT: 29 (ref 10–40)
AST: 18 (ref 14–40)
Alkaline Phosphatase: 99 (ref 25–125)
Bilirubin, Total: 0.5

## 2017-04-04 LAB — BASIC METABOLIC PANEL
BUN: 14 (ref 4–21)
Creatinine: 1.1 (ref 0.6–1.3)
Glucose: 100
Potassium: 4.5 (ref 3.4–5.3)
SODIUM: 140 (ref 137–147)

## 2017-04-04 LAB — LIPID PANEL
CHOLESTEROL: 204 — AB (ref 0–200)
HDL: 63 (ref 35–70)
LDL CALC: 124
LDL/HDL RATIO: 2
TRIGLYCERIDES: 87 (ref 40–160)

## 2017-04-04 LAB — HEMOGLOBIN A1C: Hgb A1c MFr Bld: 6.6 — AB (ref 4.0–6.0)

## 2017-04-04 MED FILL — IRBESARTAN 75 MG TABLET: 75 | 30 days supply | Qty: 30 | Fill #0

## 2017-04-04 MED FILL — IPRAT-ALBUT 0.5-3(2.5) MG/3: 0.5-2.5 (3) | 23 days supply | Qty: 270 | Fill #0

## 2017-04-12 MED FILL — OMEPRAZOLE DR 40 MG CAPSULE: 40 | 90 days supply | Qty: 90 | Fill #2

## 2017-04-13 ENCOUNTER — Telehealth: Payer: Self-pay | Admitting: Internal Medicine

## 2017-04-13 MED ORDER — LOSARTAN POTASSIUM 50 MG PO TABS: 50.0000 mg | ORAL_TABLET | Freq: Every day | ORAL | 1 refills | Status: DC

## 2017-04-13 MED FILL — LOSARTAN POTASSIUM 50 MG TA: 50 | 30 days supply | Qty: 30 | Fill #0

## 2017-04-13 MED FILL — SYMBICORT 160-4.5 MCG INH: 160-4.5 | 30 days supply | Qty: 10 | Fill #2

## 2017-04-13 NOTE — Telephone Encounter (Signed)
ATC patient twice, received a busy signal each time.   Will attempt to call back later.

## 2017-04-13 NOTE — Telephone Encounter (Signed)
That's fine but don't throw the medication away as I doubt it was the cause but you never know.  We can discuss this more at f/u ov

## 2017-04-13 NOTE — Telephone Encounter (Signed)
MW please advise can patient go back to Losartan. Thanks!

## 2017-04-13 NOTE — Telephone Encounter (Signed)
Called patient, unable to reach left message to give [place] a call back. Will send in prescription for Losartan 50 mg back to pharmacy per ok by MW.

## 2017-05-17 MED FILL — LOSARTAN POTASSIUM 50 MG TA: 50 | 30 days supply | Qty: 30 | Fill #1

## 2017-06-18 DIAGNOSIS — I10 Essential (primary) hypertension: Secondary | ICD-10-CM | POA: Diagnosis not present

## 2017-06-18 DIAGNOSIS — M50121 Cervical disc disorder at C4-C5 level with radiculopathy: Secondary | ICD-10-CM | POA: Insufficient documentation

## 2017-06-18 DIAGNOSIS — M4802 Spinal stenosis, cervical region: Secondary | ICD-10-CM | POA: Diagnosis not present

## 2017-06-18 DIAGNOSIS — J439 Emphysema, unspecified: Secondary | ICD-10-CM | POA: Diagnosis not present

## 2017-06-19 MED FILL — LOSARTAN POTASSIUM 50 MG TA: 50 | 90 days supply | Qty: 90 | Fill #0

## 2017-06-27 MED FILL — SYMBICORT 160-4.5 MCG INH: 160-4.5 | 30 days supply | Qty: 10 | Fill #0

## 2017-08-17 MED FILL — OMEPRAZOLE DR 40 MG CAPSULE: 40 | 90 days supply | Qty: 90 | Fill #0

## 2017-09-28 DIAGNOSIS — F1410 Cocaine abuse, uncomplicated: Secondary | ICD-10-CM | POA: Diagnosis not present

## 2017-09-28 DIAGNOSIS — J449 Chronic obstructive pulmonary disease, unspecified: Secondary | ICD-10-CM | POA: Diagnosis not present

## 2017-09-28 DIAGNOSIS — F17210 Nicotine dependence, cigarettes, uncomplicated: Secondary | ICD-10-CM | POA: Diagnosis not present

## 2017-09-28 DIAGNOSIS — J439 Emphysema, unspecified: Secondary | ICD-10-CM | POA: Diagnosis not present

## 2017-09-28 MED FILL — levoFLOXacin 750 MG TABS: 750 | 14 days supply | Qty: 14 | Fill #0

## 2017-09-28 MED FILL — CYCLOBENZAPRINE 10 MG TAB: 10 | 30 days supply | Qty: 30 | Fill #0

## 2017-09-28 MED FILL — SPIRIVA RESPIMAT 1.25 MCG I: 1.25 | 30 days supply | Qty: 4 | Fill #0

## 2017-09-28 MED FILL — predniSONE 10 MG TABS: 10 | 12 days supply | Qty: 48 | Fill #0

## 2017-09-28 MED FILL — VENTOLIN HFA 90 MCG INHALER: 108 (90 BAS | 25 days supply | Qty: 18 | Fill #0

## 2017-10-02 MED FILL — MONTELUKAST SOD 10 MG TAB: 10 | 30 days supply | Qty: 30 | Fill #0

## 2017-10-04 MED FILL — LOSARTAN POTASSIUM 50 MG TA: 50 | 90 days supply | Qty: 90 | Fill #0

## 2017-11-17 ENCOUNTER — Encounter: Payer: Self-pay | Admitting: Internal Medicine

## 2017-11-17 DIAGNOSIS — M50121 Cervical disc disorder at C4-C5 level with radiculopathy: Secondary | ICD-10-CM

## 2017-11-28 ENCOUNTER — Ambulatory Visit: Payer: 59 | Admitting: Internal Medicine

## 2017-11-28 VITALS — BP 128/80 | Temp 97.8°F | Ht 70.5 in | Wt 191.4 lb

## 2017-11-28 DIAGNOSIS — M5416 Radiculopathy, lumbar region: Secondary | ICD-10-CM | POA: Diagnosis not present

## 2017-11-28 DIAGNOSIS — I10 Essential (primary) hypertension: Secondary | ICD-10-CM | POA: Diagnosis not present

## 2017-11-28 DIAGNOSIS — Z0001 Encounter for general adult medical examination with abnormal findings: Secondary | ICD-10-CM

## 2017-11-28 DIAGNOSIS — Z1211 Encounter for screening for malignant neoplasm of colon: Secondary | ICD-10-CM

## 2017-11-28 DIAGNOSIS — R3911 Hesitancy of micturition: Secondary | ICD-10-CM

## 2017-11-28 DIAGNOSIS — L299 Pruritus, unspecified: Secondary | ICD-10-CM

## 2017-11-28 DIAGNOSIS — Z23 Encounter for immunization: Secondary | ICD-10-CM | POA: Diagnosis not present

## 2017-11-28 DIAGNOSIS — J441 Chronic obstructive pulmonary disease with (acute) exacerbation: Secondary | ICD-10-CM

## 2017-11-28 DIAGNOSIS — Z125 Encounter for screening for malignant neoplasm of prostate: Secondary | ICD-10-CM | POA: Diagnosis not present

## 2017-11-28 NOTE — Progress Notes (Addendum)
Subjective:     [name], male   DOB: [birth year], 67 y.o.   MRN: [id number]  HPI  Pt is here for Medicare wellness and complete physical. He does not have a urologist. Has problems with nocturia and hesitation. His wife is with him today.  Allergies  Allergen Reactions  . Penicillins Anaphylaxis and Hives    Has patient had a PCN reaction causing immediate rash, facial/tongue/throat swelling, SOB or lightheadedness with hypotension: No Has patient had a PCN reaction causing severe rash involving mucus membranes or skin necrosis: No Has patient had a PCN reaction that required hospitalization No Has patient had a PCN reaction occurring within the last 10 years: No If all of the above answers are "NO", then may proceed with Cephalosporin use.    albuterol (PROVENTIL HFA;VENTOLIN HFA) inhaler budesonide-formoterol (SYMBICORT) inhaler 80-4.5 mcg/puff buPROPion (WELLBUTRIN XL) 24 hr tablet cyclobenzaprine (FLEXERIL) tablet FLUTTER ipratropium-albuterol (DUONEB) nebulizer solution losartan (COZAAR) tablet LYRICA montelukast (SINGULAIR) tablet 10 mg omeprazole (PRILOSEC) capsule Tiotropium Bromide Monohydrate   Past Medical History:  Diagnosis Date  . Arthritis   . Bullous emphysema (HCC)    Severe Bilateral  . Cocaine abuse ([place])   . Colitis    with bleeding  . COPD (chronic obstructive pulmonary disease) ([place])   . Dyspnea   . GERD (gastroesophageal reflux disease)   . Headache    sinusitis  . Hypertension   . LBP (low back pain)    now chronic  . Lumbago   . Lung mass    Right Upper Lobe pleural based mass, negative  on PET Scan  . Neck pain    nerve pain   . Pneumonia    hx  . Spine pain    nerve  . Therapeutic drug monitoring   . Tobacco abuse     Review of Systems  Constitutional: Negative.        His appetite fluctuates and appetite also fluctuates   HENT: Negative for congestion, dental problem, drooling, ear discharge, ear pain,  facial swelling, hearing loss, mouth sores, nosebleeds, postnasal drip, rhinorrhea, sinus pressure, sinus pain, sneezing, sore throat, tinnitus, trouble swallowing and voice change.        Has episdic blurred vision. He is due for eye xm now. Has had intermittent muffled hearing and opening his mouth helps it.   Eyes: Positive for visual disturbance. Negative for photophobia, pain, discharge, redness and itching.  Respiratory: Positive for cough and shortness of breath. Negative for apnea, choking and chest tightness.        Has COPD and nebs helps and FU with pulmonologist. Gets intermittent wheezing and he is SOB all the time, but his meds help  Cardiovascular: Negative.   Gastrointestinal: Positive for abdominal distention. Negative for abdominal pain, anal bleeding, blood in stool, constipation, diarrhea, nausea, rectal pain and vomiting.       Sometimes from IBS and get fiber or myralax to clean up  Endocrine: Positive for cold intolerance. Negative for heat intolerance, polydipsia and polyphagia.  Genitourinary: Negative for difficulty urinating, dysuria, enuresis, flank pain, frequency, genital sores, scrotal swelling, testicular pain and urgency.       Has hesitation, and flow is decreased in the possible.   Musculoskeletal: Positive for back pain, gait problem, neck pain and neck stiffness. Negative for joint swelling.       Pain cream and Ibuprofen helps his L lumbar back pain. Has R leg pain from lumbar radiculopathy  Skin: Negative for rash and  wound.       Has been itching x 3 weeks but does have new comforter and egg sheel, and now out, but is better  Allergic/Immunologic: Negative for environmental allergies and food allergies.  Neurological: Positive for weakness and numbness. Negative for dizziness, tremors, seizures, syncope and headaches.       R foot neuropathy x 3 years since back surgery. Has R arm numbness with different neck possitions in the past week, but resolves after he  changes position. R foot weakness off and on  Hematological: Negative for adenopathy. Does not bruise/bleed easily.  Psychiatric/Behavioral: Negative for confusion, decreased concentration, hallucinations, sleep disturbance and suicidal ideas. The patient is not nervous/anxious.        Objective:   Physical Exam BP 128/80 (BP Location: Left Arm, Patient Position: Sitting)   Temp 97.8 F (36.6 C) (Oral)   Ht 5' 10.5" (1.791 m)   Wt 191 lb 6.4 oz (86.8 kg)   BMI 27.07 kg/m   General Appearance:    Alert, cooperative, no distress, appears stated age  Head:    Normocephalic, without obvious abnormality, atraumatic  Eyes:    PERRL, conjunctiva/corneas clear, EOM's intact, both eyes       Ears:    Normal TM's and external ear canals, both ears  Nose:   Nares normal, septum midline, mucosa normal, no drainage   or sinus tenderness  Throat:   Lips, mucosa, and tongue normal; teeth and gums normal  Neck:   Supple, symmetrical, trachea midline, no adenopathy;       thyroid:  No enlargement/tenderness/nodules; no carotid   bruit  Back:     Symmetric, no curvature, ROM decreased due to stiffness and pain on lower lumbar region with ROM.   Lungs:     Has diffuse wheezing which got worse when laying down. His respirations unlabored  Chest wall:    No tenderness or deformity  Heart:    Regular rate and rhythm, S1 and S2 normal, no murmur, rub   or gallop  Abdomen:     Soft, non-tender, bowel sounds active all four quadrants,    no masses, no organomegaly  Genitalia:    declined   Rectal:    declined  Extremities:   Extremities normal, atraumatic, no cyanosis or edema  Pulses:   2+ and symmetric all extremities  Skin:   Skin color, texture, turgor normal, no rashes or lesions  Lymph nodes:   Cervical, supraclavicular, and axillary nodes normal  Neurologic:   CNII-XII intact. Normal strength, unable to obtain R achillis reflex, the other were +2/4. SLR neg. Normal Romberg, heel and tip toe  gait, and was a little off balance with tandem gait. Has symmetric strength of upper and lower extremities.        Assessment:    1- Encounter for general adult medical examination with abnormal findings. 2- Urinary hesitance- urology referral made 3- HTN- stable, was seen for Welcome to Medicare visit.  4- Pruritis- improving 5- needs update immunization- high dose flu shot given 6- Lumbar radiculopathy- chronic 7- COPD- chronic wheezing- will continue care with pulmonologist.      Plan:     Labs ordered as noted, we will call him when the results are back. PSA was added.   I cant find when his last colonoscopy was in his [facility] medical records, so I placed a referral for GI.  Fu with PCP in 3 months.

## 2017-11-29 LAB — CBC
Hematocrit: 49.5 % (ref 37.5–51.0)
Hemoglobin: 16.3 g/dL (ref 13.0–17.7)
MCH: 28.7 pg (ref 26.6–33.0)
MCHC: 32.9 g/dL (ref 31.5–35.7)
MCV: 87 fL (ref 79–97)
Platelets: 237 10*3/uL (ref 150–450)
RBC: 5.68 x10E6/uL (ref 4.14–5.80)
RDW: 15.2 % (ref 12.3–15.4)
WBC: 8.3 10*3/uL (ref 3.4–10.8)

## 2017-11-29 LAB — LIPID PANEL
CHOL/HDL RATIO: 4.4 ratio (ref 0.0–5.0)
Cholesterol, Total: 205 mg/dL — ABNORMAL HIGH (ref 100–199)
HDL: 47 mg/dL (ref >39)
LDL Calculated: 140 mg/dL — ABNORMAL HIGH (ref 0–99)
Triglycerides: 89 mg/dL (ref 0–149)
VLDL Cholesterol Cal: 18 mg/dL (ref 5–40)

## 2017-11-29 LAB — CMP14 + ANION GAP
A/G RATIO: 1.6 (ref 1.2–2.2)
ALBUMIN: 4.4 g/dL (ref 3.6–4.8)
ALT: 20 IU/L (ref 0–44)
AST: 16 IU/L (ref 0–40)
Alkaline Phosphatase: 110 IU/L (ref 39–117)
Anion Gap: 16 mmol/L (ref 10.0–18.0)
BUN / CREAT RATIO: 8 — AB (ref 10–24)
BUN: 9 mg/dL (ref 8–27)
Bilirubin Total: 0.5 mg/dL (ref 0.0–1.2)
CHLORIDE: 104 mmol/L (ref 96–106)
CO2: 24 mmol/L (ref 20–29)
Calcium: 10.1 mg/dL (ref 8.6–10.2)
Creatinine, Ser: 1.15 mg/dL (ref 0.76–1.27)
GFR, EST AFRICAN AMERICAN: 77 mL/min/{1.73_m2} (ref >59)
GFR, EST NON AFRICAN AMERICAN: 66 mL/min/{1.73_m2} (ref >59)
GLOBULIN, TOTAL: 2.8 g/dL (ref 1.5–4.5)
GLUCOSE: 98 mg/dL (ref 65–99)
POTASSIUM: 4.9 mmol/L (ref 3.5–5.2)
SODIUM: 144 mmol/L (ref 134–144)
TOTAL PROTEIN: 7.2 g/dL (ref 6.0–8.5)

## 2017-11-29 LAB — T3, FREE: T3, Free: 3.5 pg/mL (ref 2.0–4.4)

## 2017-11-29 LAB — TSH: TSH: 2.79 u[IU]/mL (ref 0.450–4.500)

## 2017-11-29 LAB — T4, FREE: FREE T4: 1.16 ng/dL (ref 0.82–1.77)

## 2017-11-29 MED FILL — SYMBICORT 160-4.5 MCG INH: 160-4.5 | 30 days supply | Qty: 10 | Fill #1

## 2017-11-30 ENCOUNTER — Telehealth: Payer: Self-pay

## 2017-11-30 NOTE — Telephone Encounter (Signed)
LMTCB ( looking to see where patient last did his colonoscopy no reports )

## 2017-12-01 LAB — SPECIMEN STATUS REPORT

## 2017-12-01 LAB — PSA: PROSTATE SPECIFIC AG, SERUM: 0.8 ng/mL (ref 0.0–4.0)

## 2017-12-04 ENCOUNTER — Telehealth: Payer: Self-pay

## 2017-12-04 NOTE — Telephone Encounter (Signed)
Patient called back states that he had one done 15 years ago with Dr. Collene Mares he thinks that is who it was with but has not had one done since then. And he also wants to know why did he get shifted to seeing you since Dr. Baird Cancer has been his provider and he has also seen janece he feels like he is being tossed around. PA

## 2017-12-04 NOTE — Addendum Note (Signed)
Addended by: Nicki Guadalajara on: 12/04/2017 01:31 PM   Modules accepted: Orders

## 2017-12-04 NOTE — Telephone Encounter (Signed)
Tell him I dont know why they are switching him, pts end up in my schedule because I'm open and if they are due to physicals or visits, they use my schedule. He can ask Ebony.

## 2017-12-05 NOTE — Telephone Encounter (Signed)
LDVM

## 2017-12-11 DIAGNOSIS — J439 Emphysema, unspecified: Secondary | ICD-10-CM | POA: Diagnosis not present

## 2017-12-11 DIAGNOSIS — J449 Chronic obstructive pulmonary disease, unspecified: Secondary | ICD-10-CM | POA: Diagnosis not present

## 2017-12-11 DIAGNOSIS — F17210 Nicotine dependence, cigarettes, uncomplicated: Secondary | ICD-10-CM | POA: Diagnosis not present

## 2017-12-25 ENCOUNTER — Encounter: Payer: Self-pay | Admitting: Internal Medicine

## 2018-01-02 DIAGNOSIS — R911 Solitary pulmonary nodule: Secondary | ICD-10-CM | POA: Diagnosis not present

## 2018-01-02 DIAGNOSIS — R05 Cough: Secondary | ICD-10-CM | POA: Diagnosis not present

## 2018-01-02 DIAGNOSIS — K219 Gastro-esophageal reflux disease without esophagitis: Secondary | ICD-10-CM | POA: Diagnosis not present

## 2018-01-02 DIAGNOSIS — J439 Emphysema, unspecified: Secondary | ICD-10-CM | POA: Diagnosis not present

## 2018-01-02 DIAGNOSIS — F17210 Nicotine dependence, cigarettes, uncomplicated: Secondary | ICD-10-CM | POA: Diagnosis not present

## 2018-01-02 DIAGNOSIS — J449 Chronic obstructive pulmonary disease, unspecified: Secondary | ICD-10-CM | POA: Diagnosis not present

## 2018-01-02 DIAGNOSIS — J180 Bronchopneumonia, unspecified organism: Secondary | ICD-10-CM | POA: Diagnosis not present

## 2018-01-02 MED FILL — SYMBICORT 160-4.5 MCG INH: 160-4.5 | 30 days supply | Qty: 10 | Fill #2

## 2018-01-04 MED FILL — SPIRIVA RESPIMAT 1.25 MCG I: 1.25 | 30 days supply | Qty: 4 | Fill #1

## 2018-01-21 ENCOUNTER — Other Ambulatory Visit: Payer: Self-pay | Admitting: Internal Medicine

## 2018-01-21 MED FILL — LOSARTAN POTASSIUM 50 MG TA: 50 | 90 days supply | Qty: 90 | Fill #0

## 2018-01-28 MED FILL — OMEPRAZOLE 40 MG CPDR: 40 | 90 days supply | Qty: 90 | Fill #1

## 2018-01-29 MED FILL — DICLOFENAC SODIUM 75 MG TAB: 75 | 30 days supply | Qty: 60 | Fill #0

## 2018-02-02 DIAGNOSIS — F17210 Nicotine dependence, cigarettes, uncomplicated: Secondary | ICD-10-CM | POA: Diagnosis not present

## 2018-03-01 ENCOUNTER — Telehealth: Payer: Self-pay

## 2018-03-01 NOTE — Telephone Encounter (Signed)
Left the pt a message with the information to the [facility] orthopedic urgent care center so he can be evaluated for his right shoulder pain.

## 2018-03-05 ENCOUNTER — Ambulatory Visit: Payer: 59 | Admitting: Internal Medicine

## 2018-03-05 ENCOUNTER — Encounter: Payer: Self-pay | Admitting: Internal Medicine

## 2018-03-05 ENCOUNTER — Other Ambulatory Visit: Payer: Self-pay

## 2018-03-05 VITALS — BP 132/68 | HR 91 | Temp 97.8°F | Ht 69.4 in | Wt 190.8 lb

## 2018-03-05 DIAGNOSIS — W11XXXA Fall on and from ladder, initial encounter: Secondary | ICD-10-CM | POA: Diagnosis not present

## 2018-03-05 DIAGNOSIS — Z720 Tobacco use: Secondary | ICD-10-CM

## 2018-03-05 DIAGNOSIS — I129 Hypertensive chronic kidney disease with stage 1 through stage 4 chronic kidney disease, or unspecified chronic kidney disease: Secondary | ICD-10-CM | POA: Insufficient documentation

## 2018-03-05 DIAGNOSIS — Z716 Tobacco abuse counseling: Secondary | ICD-10-CM | POA: Insufficient documentation

## 2018-03-05 DIAGNOSIS — I10 Essential (primary) hypertension: Secondary | ICD-10-CM

## 2018-03-05 DIAGNOSIS — S29012A Strain of muscle and tendon of back wall of thorax, initial encounter: Secondary | ICD-10-CM | POA: Diagnosis not present

## 2018-03-05 DIAGNOSIS — R7309 Other abnormal glucose: Secondary | ICD-10-CM | POA: Diagnosis not present

## 2018-03-05 DIAGNOSIS — J449 Chronic obstructive pulmonary disease, unspecified: Secondary | ICD-10-CM | POA: Diagnosis not present

## 2018-03-05 DIAGNOSIS — K5900 Constipation, unspecified: Secondary | ICD-10-CM | POA: Insufficient documentation

## 2018-03-05 DIAGNOSIS — E785 Hyperlipidemia, unspecified: Secondary | ICD-10-CM | POA: Insufficient documentation

## 2018-03-05 NOTE — Progress Notes (Addendum)
Subjective:     [name] , male    DOB: [birth year] , 68 y.o.   MRN: [id number]   Chief Complaint  Patient presents with  . Hypertension    HPI 1- Pt is here for HTN FU. He is  working on quitting and is still at 5 ctts per day. Has slight galucoma per eye xm last month.  2-Mid December He fell off latter and he was coming down as he was hanging on to the latter, felt pain on his R shoulder. He continued his regular stuff and the next day got worse. Has shotting pain from R mid scapula down and has tingling on R hand.   Pt used topical pain med which did no help. He describes his pain as burning sensation. Denies hearing a pop or click when he injured himself. .Denies being bothered with neck pain, but does have history of cervical disc disease which he chose no surgery.   Saw his pulmonolgist in December and was seen for Bronchitis, and his FU has increased chest mucous and Mucinex is helping. His COPD is stable on current meds.  He has apt with Dr Collene Mares for his colonoscopy.  He is upset with me because he never heard from a urologist I said I would refer. But he can make his own appointment, he does not need me to do this today,  Past Medical History:  Diagnosis Date  . Arthritis   . Bullous emphysema (HCC)    Severe Bilateral  . Cocaine abuse ([place])   . Colitis    with bleeding  . COPD (chronic obstructive pulmonary disease) ([place])   . Dyspnea   . GERD (gastroesophageal reflux disease)   . Headache    sinusitis  . Hypertension   . LBP (low back pain)    now chronic  . Lumbago   . Lung mass    Right Upper Lobe pleural based mass, negative  on PET Scan  . Neck pain    nerve pain   . Pneumonia    hx  . Spine pain    nerve  . Therapeutic drug monitoring   . Tobacco abuse      Family History  Problem Relation Age of Onset  . Cancer Father   . Cervical cancer Sister   . Emphysema Mother      Current Outpatient Medications:  .  albuterol  (PROVENTIL HFA;VENTOLIN HFA) 108 (90 BASE) MCG/ACT inhaler, Inhale 2 puffs into the lungs every 6 (six) hours as needed for wheezing or shortness of breath. Shortness of breath, Disp: , Rfl:  .  budesonide-formoterol (SYMBICORT) 80-4.5 MCG/ACT inhaler, Inhale 2 puffs into the lungs 2 (two) times daily., Disp: 1 Inhaler, Rfl: 11 .  buPROPion (WELLBUTRIN XL) 150 MG 24 hr tablet, Take 1 tablet by mouth daily., Disp: , Rfl:  .  cyclobenzaprine (FLEXERIL) 10 MG tablet, Take 10 mg by mouth as needed for muscle spasms., Disp: , Rfl:  .  ipratropium-albuterol (DUONEB) 0.5-2.5 (3) MG/3ML SOLN, Take 3 mLs by nebulization every 4 (four) hours as needed (shortness of breath and wheezing, coughing)., Disp: 360 mL, Rfl: 1 .  losartan (COZAAR) 50 MG tablet, TAKE 1 TABLET BY MOUTH DAILY, Disp: 90 tablet, Rfl: 0 .  LYRICA 50 MG capsule, Take 50 mg by mouth 2 (two) times daily. , Disp: , Rfl: 3 .  montelukast (SINGULAIR) 10 MG tablet, every evening., Disp: , Rfl: 1 .  omeprazole (PRILOSEC) 40 MG capsule, TAKE  1 CAPSULE BY MOUTH DAILY, Disp: 30 capsule, Rfl: 1 .  Respiratory Therapy Supplies (FLUTTER) DEVI, Use as directed, Disp: 1 each, Rfl: 0 .  Tiotropium Bromide Monohydrate (SPIRIVA RESPIMAT) 2.5 MCG/ACT AERS, Inhale 2 puffs into the lungs daily., Disp: , Rfl:    Allergies  Allergen Reactions  . Penicillins Anaphylaxis and Hives    Has patient had a PCN reaction causing immediate rash, facial/tongue/throat swelling, SOB or lightheadedness with hypotension: No Has patient had a PCN reaction causing severe rash involving mucus membranes or skin necrosis: No Has patient had a PCN reaction that required hospitalization No Has patient had a PCN reaction occurring within the last 10 years: No If all of the above answers are "NO", then may proceed with Cephalosporin use.       Review of Systems  Constitutional: Negative for appetite change and diaphoresis.  Eyes: Negative for visual disturbance.  Respiratory:  Positive for cough and wheezing.   Cardiovascular: Negative for chest pain, palpitations and leg swelling.  Gastrointestinal: Negative for abdominal distention, abdominal pain and constipation.    Today's Vitals   03/05/18 0935  BP: 132/68  Pulse: 91  Temp: 97.8 F (36.6 C)  TempSrc: Oral  SpO2: 98%  Weight: 190 lb 12.8 oz (86.5 kg)  Height: 5' 9.4" (1.763 m)   Body mass index is 27.85 kg/m.   Objective:  Physical Exam  Constitutional: She is oriented to person, place, and time. She appears well-developed and well-nourished. No distress.  HENT:  Head: Normocephalic and atraumatic.  Right Ear: External ear normal.  Left Ear: External ear normal.  Nose: Nose normal.  Eyes: Conjunctivae are normal. Right eye exhibits no discharge. Left eye exhibits no discharge. No scleral icterus.  Neck: Neck supple. No thyromegaly present.  Cardiovascular: Normal rate and regular rhythm. No murmur heard. Pulmonary/Chest: Effort normal, has mild scattered wheezing.   Musculoskeletal: Has slight decreased ROM of R shoulder due to pain on R scapula region. There is no local pain in the R shoulder with palpation. R mid rhomboid is very tender to palpation, but there is no crepitations or swelling noted. Has symmetric strength of upper extremities.   Lower legs- He exhibits no edema.  Lymphadenopathy:    He has no cervical adenopathy.  Neurological: He is alert and oriented to person, place, and time. UE reflexes are symmetric and intact.  Skin: Skin is warm and dry. Capillary refill takes less than 2 seconds. No rash noted. He is not diaphoretic.  Psychiatric: She has a normal mood and affect. Her behavior is normal. Judgment and thought content normal.  Nursing note reviewed. Assessment And Plan:     1. Essential hypertension- stable. May continue same meds - CMP14 + Anion Gap - CBC no Diff  2. Abnormal glucose- chronic - Hemoglobin A1c  3. Strain of rhomboid muscle, initial encounter-  acute. I asked him if he wanted to see ortho or try PT. I taught him to do R shoulder and Rhomboid stretches. Advised to use ice and heat alternation and do stretches after the heat. He said he can self refer himself to ortho if he feels he needs it. 4- COPD- chronic, will continue with current care from his pulmonologist.  I apologized to him about no placing the urology referral for him and he was fine at the end of the visit. Fu with Dr Baird Cancer for HTN in 3 months.    Ola Raap RODRIGUEZ-SOUTHWORTH, PA-C

## 2018-03-06 ENCOUNTER — Other Ambulatory Visit: Payer: Self-pay | Admitting: Internal Medicine

## 2018-03-06 DIAGNOSIS — E7800 Pure hypercholesterolemia, unspecified: Secondary | ICD-10-CM

## 2018-03-06 DIAGNOSIS — E559 Vitamin D deficiency, unspecified: Secondary | ICD-10-CM

## 2018-03-06 LAB — CBC
HEMATOCRIT: 48.5 % (ref 37.5–51.0)
Hemoglobin: 16 g/dL (ref 13.0–17.7)
MCH: 28.4 pg (ref 26.6–33.0)
MCHC: 33 g/dL (ref 31.5–35.7)
MCV: 86 fL (ref 79–97)
Platelets: 277 10*3/uL (ref 150–450)
RBC: 5.64 x10E6/uL (ref 4.14–5.80)
RDW: 13.8 % (ref 11.6–15.4)
WBC: 9.8 10*3/uL (ref 3.4–10.8)

## 2018-03-06 LAB — HEMOGLOBIN A1C
ESTIMATED AVERAGE GLUCOSE: 131 mg/dL
Hgb A1c MFr Bld: 6.2 % — ABNORMAL HIGH (ref 4.8–5.6)

## 2018-03-06 LAB — CMP14 + ANION GAP
ALK PHOS: 118 IU/L — AB (ref 39–117)
ALT: 22 IU/L (ref 0–44)
ANION GAP: 15 mmol/L (ref 10.0–18.0)
AST: 16 IU/L (ref 0–40)
Albumin/Globulin Ratio: 1.6 (ref 1.2–2.2)
Albumin: 4.5 g/dL (ref 3.6–4.8)
BILIRUBIN TOTAL: 0.4 mg/dL (ref 0.0–1.2)
BUN/Creatinine Ratio: 11 (ref 10–24)
BUN: 14 mg/dL (ref 8–27)
CHLORIDE: 100 mmol/L (ref 96–106)
CO2: 24 mmol/L (ref 20–29)
Calcium: 9.8 mg/dL (ref 8.6–10.2)
Creatinine, Ser: 1.24 mg/dL (ref 0.76–1.27)
GFR calc Af Amer: 70 mL/min/{1.73_m2} (ref >59)
GFR calc non Af Amer: 61 mL/min/{1.73_m2} (ref >59)
GLUCOSE: 102 mg/dL — AB (ref 65–99)
Globulin, Total: 2.8 g/dL (ref 1.5–4.5)
Potassium: 5.1 mmol/L (ref 3.5–5.2)
Sodium: 139 mmol/L (ref 134–144)
Total Protein: 7.3 g/dL (ref 6.0–8.5)

## 2018-03-07 ENCOUNTER — Telehealth: Payer: Self-pay

## 2018-03-07 NOTE — Telephone Encounter (Signed)
-----   Message from Glendale Chard, MD sent at 03/04/2018  5:58 PM EST ----- Please call pt to see if he went to orthopedic urgent care.  ----- Message ----- From: Michelle Nasuti, [place] Sent: 03/01/2018  11:49 AM EST To: Glendale Chard, MD  The pt's wife said that the pt spoke with Dr. Baird Cancer about getting a referral for an x-ray of his right shoulder because is is swollen and painful to the touch.  Mrs. Noah said that the pt is on a fixed income and wants to know if he can get a referral to an orthopedic because they can examine and treat the pt if need be.

## 2018-03-07 NOTE — Telephone Encounter (Signed)
Left the pt a message to call back to see if he went to the orthopedic urgent care.

## 2018-03-12 ENCOUNTER — Other Ambulatory Visit: Payer: Self-pay | Admitting: Internal Medicine

## 2018-03-12 DIAGNOSIS — R748 Abnormal levels of other serum enzymes: Secondary | ICD-10-CM

## 2018-03-14 LAB — LIPID PANEL W/O CHOL/HDL RATIO
Cholesterol, Total: 205 mg/dL — ABNORMAL HIGH (ref 100–199)
HDL: 48 mg/dL (ref >39)
LDL CALC: 134 mg/dL — AB (ref 0–99)
Triglycerides: 116 mg/dL (ref 0–149)
VLDL CHOLESTEROL CAL: 23 mg/dL (ref 5–40)

## 2018-03-18 MED FILL — SPIRIVA RESPIMAT 1.25 MCG I: 1.25 | 30 days supply | Qty: 4 | Fill #2

## 2018-04-19 ENCOUNTER — Other Ambulatory Visit: Payer: Self-pay

## 2018-04-19 ENCOUNTER — Encounter: Payer: Self-pay | Admitting: Internal Medicine

## 2018-04-19 ENCOUNTER — Ambulatory Visit: Payer: 59 | Admitting: Internal Medicine

## 2018-04-19 VITALS — BP 148/76 | HR 85 | Temp 97.4°F | Ht 69.5 in | Wt 187.0 lb

## 2018-04-19 DIAGNOSIS — M5412 Radiculopathy, cervical region: Secondary | ICD-10-CM | POA: Diagnosis not present

## 2018-04-19 DIAGNOSIS — F17210 Nicotine dependence, cigarettes, uncomplicated: Secondary | ICD-10-CM

## 2018-04-19 DIAGNOSIS — J209 Acute bronchitis, unspecified: Secondary | ICD-10-CM | POA: Diagnosis not present

## 2018-04-19 DIAGNOSIS — I10 Essential (primary) hypertension: Secondary | ICD-10-CM | POA: Diagnosis not present

## 2018-04-19 DIAGNOSIS — J441 Chronic obstructive pulmonary disease with (acute) exacerbation: Secondary | ICD-10-CM

## 2018-04-19 DIAGNOSIS — I700 Atherosclerosis of aorta: Secondary | ICD-10-CM | POA: Diagnosis not present

## 2018-04-19 MED ORDER — HYDROCODONE-HOMATROPINE 5-1.5 MG/5ML PO SYRP: 5.0000 mL | ORAL_SOLUTION | Freq: Four times a day (QID) | ORAL | 0 refills | Status: DC | PRN

## 2018-04-19 MED ORDER — AZITHROMYCIN 250 MG PO TABS: ORAL_TABLET | ORAL | 0 refills | Status: AC

## 2018-04-19 MED ORDER — TRIAMCINOLONE ACETONIDE 40 MG/ML IJ SUSP
60.0000 mg | Freq: Once | INTRAMUSCULAR | Status: AC
  Administered 2018-04-19: 60 mg via INTRAMUSCULAR

## 2018-04-19 MED FILL — AZITHROMYCIN 250 MG TABLET: 250 | 5 days supply | Qty: 6 | Fill #0

## 2018-04-19 MED FILL — HYDROCODONE-HOMATROPINE SOL: 5-1.5 | 6 days supply | Qty: 120 | Fill #0

## 2018-04-21 NOTE — Progress Notes (Signed)
Subjective:     [name] , male    DOB: [birth year] , 68 y.o.   MRN: [id number]   Chief Complaint  Patient presents with  . Cough    3 weeks-hurts in back-mucus-wheezing    HPI  Cough  This is a recurrent problem. The current episode started in the past 7 days. The problem has been gradually worsening. The problem occurs every few minutes. The cough is productive of brown sputum. Pertinent negatives include no ear congestion, ear pain, headaches, sweats or weight loss. The symptoms are aggravated by lying down. Risk factors for lung disease include smoking/tobacco exposure. He has tried steroid inhaler for the symptoms. The treatment provided no relief. His past medical history is significant for COPD.     Past Medical History:  Diagnosis Date  . Arthritis   . Bullous emphysema (HCC)    Severe Bilateral  . Cocaine abuse ([place])   . Colitis    with bleeding  . COPD (chronic obstructive pulmonary disease) ([place])   . Dyspnea   . GERD (gastroesophageal reflux disease)   . Headache    sinusitis  . Hypertension   . LBP (low back pain)    now chronic  . Lumbago   . Lung mass    Right Upper Lobe pleural based mass, negative  on PET Scan  . Neck pain    nerve pain   . Pneumonia    hx  . Spine pain    nerve  . Therapeutic drug monitoring   . Tobacco abuse      Family History  Problem Relation Age of Onset  . Cancer Father   . Cervical cancer Sister   . Emphysema Mother      Current Outpatient Medications:  .  albuterol (PROVENTIL HFA;VENTOLIN HFA) 108 (90 BASE) MCG/ACT inhaler, Inhale 2 puffs into the lungs every 6 (six) hours as needed for wheezing or shortness of breath. Shortness of breath, Disp: , Rfl:  .  azithromycin (ZITHROMAX Z-PAK) 250 MG tablet, Take 2 tablets (500 mg) on  Day 1,  followed by 1 tablet (250 mg) once daily on Days 2 through 5., Disp: 6 each, Rfl: 0 .  budesonide-formoterol (SYMBICORT) 80-4.5 MCG/ACT inhaler, Inhale 2 puffs  into the lungs 2 (two) times daily., Disp: 1 Inhaler, Rfl: 11 .  buPROPion (WELLBUTRIN XL) 150 MG 24 hr tablet, Take 1 tablet by mouth daily., Disp: , Rfl:  .  cyclobenzaprine (FLEXERIL) 10 MG tablet, Take 10 mg by mouth as needed for muscle spasms., Disp: , Rfl:  .  HYDROcodone-homatropine (HYDROMET) 5-1.5 MG/5ML syrup, Take 5 mLs by mouth every 6 (six) hours as needed., Disp: 120 mL, Rfl: 0 .  ipratropium-albuterol (DUONEB) 0.5-2.5 (3) MG/3ML SOLN, Take 3 mLs by nebulization every 4 (four) hours as needed (shortness of breath and wheezing, coughing)., Disp: 360 mL, Rfl: 1 .  losartan (COZAAR) 50 MG tablet, TAKE 1 TABLET BY MOUTH DAILY, Disp: 90 tablet, Rfl: 0 .  LYRICA 50 MG capsule, Take 50 mg by mouth 2 (two) times daily. , Disp: , Rfl: 3 .  montelukast (SINGULAIR) 10 MG tablet, every evening., Disp: , Rfl: 1 .  omeprazole (PRILOSEC) 40 MG capsule, TAKE 1 CAPSULE BY MOUTH DAILY, Disp: 30 capsule, Rfl: 1 .  Respiratory Therapy Supplies (FLUTTER) DEVI, Use as directed, Disp: 1 each, Rfl: 0 .  Tiotropium Bromide Monohydrate (SPIRIVA RESPIMAT) 2.5 MCG/ACT AERS, Inhale 2 puffs into the lungs daily., Disp: , Rfl:  Allergies  Allergen Reactions  . Penicillins Anaphylaxis and Hives    Has patient had a PCN reaction causing immediate rash, facial/tongue/throat swelling, SOB or lightheadedness with hypotension: No Has patient had a PCN reaction causing severe rash involving mucus membranes or skin necrosis: No Has patient had a PCN reaction that required hospitalization No Has patient had a PCN reaction occurring within the last 10 years: No If all of the above answers are "NO", then may proceed with Cephalosporin use.       Review of Systems  Constitutional: Negative.  Negative for weight loss.  HENT: Negative for ear pain.   Respiratory: Positive for cough.   Cardiovascular: Negative.   Gastrointestinal: Negative.   Neurological: Positive for numbness (he c/o numbness b/l UE,  intermittent). Negative for headaches.  Psychiatric/Behavioral: Negative.      Today's Vitals   04/19/18 1421  BP: (!) 148/76  Pulse: 85  Temp: (!) 97.4 F (36.3 C)  TempSrc: Oral  SpO2: 97%  Weight: 187 lb (84.8 kg)  Height: 5' 9.5" (1.765 m)   Body mass index is 27.22 kg/m.   Objective:  Physical Exam Vitals signs and nursing note reviewed.  HENT:     Head: Normocephalic and atraumatic.  Cardiovascular:     Rate and Rhythm: Regular rhythm.     Heart sounds: Normal heart sounds.  Pulmonary:     Breath sounds: Wheezing and rhonchi present.  Skin:    General: Skin is warm.  Neurological:     General: No focal deficit present.     Mental Status: He is alert.         Assessment And Plan:     1. Acute bronchitis, unspecified organism  He declined neb treatment. He was given rx hydromet syrup to use prn. He is also encouraged to avoid dairy products for the next week or two.   - triamcinolone acetonide (KENALOG-40) injection 60 mg  2. Chronic obstructive pulmonary disease with acute exacerbation ([place])  Again, he declined neb treatment. He was given kenalog 60mg  IM x 1 and a zpak. He is encouraged to complete full abx course.   3. Cervical radiculopathy  He is also followed by Neurosurgery. He has seen Dr. Christella Noa in the past.   4. Essential hypertension, benign  Fair control. He will continue with current meds for now. He is encouraged to limit his salt intake.   5. Cigarette nicotine dependence without complication  He is not ready to quit at this time. Will discuss Chantix at his next visit.   6. Aortic atherosclerosis ([place])  Importance of statin therapy will be discussed with patient at his next visit. Seen on low dose CT scan Dec 2019.   [name], MD

## 2018-04-26 ENCOUNTER — Other Ambulatory Visit: Payer: Self-pay | Admitting: Internal Medicine

## 2018-04-26 MED FILL — PREGABALIN 50 MG CAPS: 50 | 30 days supply | Qty: 60 | Fill #0

## 2018-04-26 NOTE — Telephone Encounter (Signed)
Lyrica 50 mg refill

## 2018-05-01 ENCOUNTER — Other Ambulatory Visit: Payer: Self-pay

## 2018-05-01 ENCOUNTER — Other Ambulatory Visit: Payer: Self-pay | Admitting: Internal Medicine

## 2018-05-01 MED ORDER — BENZONATATE 100 MG PO CAPS: 100.0000 mg | ORAL_CAPSULE | Freq: Three times a day (TID) | ORAL | 0 refills | Status: DC | PRN

## 2018-05-01 MED FILL — MONTELUKAST SOD 10 MG TAB: 10 | 30 days supply | Qty: 30 | Fill #1

## 2018-05-01 MED FILL — BENZONATATE 100 MG CAPS: 100 | 10 days supply | Qty: 30 | Fill #0

## 2018-05-01 MED FILL — LOSARTAN POTASSIUM 50 MG TA: 50 | 90 days supply | Qty: 90 | Fill #0

## 2018-05-01 MED FILL — OMEPRAZOLE 40 MG CPDR: 40 | 90 days supply | Qty: 90 | Fill #0

## 2018-05-13 MED FILL — SYMBICORT 160-4.5 MCG INH: 160-4.5 | 30 days supply | Qty: 10 | Fill #3

## 2018-05-13 MED FILL — SPIRIVA RESPIMAT 1.25 MCG I: 1.25 | 30 days supply | Qty: 4 | Fill #3

## 2018-05-13 MED FILL — VENTOLIN HFA 90 MCG INHALER: 108 (90 BAS | 25 days supply | Qty: 18 | Fill #1

## 2018-05-14 DIAGNOSIS — H5213 Myopia, bilateral: Secondary | ICD-10-CM | POA: Diagnosis not present

## 2018-05-15 ENCOUNTER — Telehealth: Payer: Self-pay

## 2018-05-15 NOTE — Telephone Encounter (Signed)
The pt wanted to know if he should be tested for the Coronavirus because of his health issues.  The pt was told that the NP Laurance Flatten said that he didn't need to be tested unless he is having some of the symptoms and the pt said that he was not and that he just wanted to be sure.  He was told to contact the office if he does.

## 2018-05-23 MED FILL — predniSONE 10 MG TABS: 10 | 12 days supply | Qty: 48 | Fill #0

## 2018-06-05 ENCOUNTER — Other Ambulatory Visit: Payer: Self-pay

## 2018-06-05 ENCOUNTER — Encounter: Payer: Self-pay | Admitting: Internal Medicine

## 2018-06-05 ENCOUNTER — Ambulatory Visit: Payer: 59 | Admitting: Internal Medicine

## 2018-06-05 VITALS — BP 132/70 | HR 85 | Temp 98.0°F | Ht 70.0 in | Wt 184.8 lb

## 2018-06-05 DIAGNOSIS — I129 Hypertensive chronic kidney disease with stage 1 through stage 4 chronic kidney disease, or unspecified chronic kidney disease: Secondary | ICD-10-CM

## 2018-06-05 DIAGNOSIS — N182 Chronic kidney disease, stage 2 (mild): Secondary | ICD-10-CM | POA: Diagnosis not present

## 2018-06-05 DIAGNOSIS — J441 Chronic obstructive pulmonary disease with (acute) exacerbation: Secondary | ICD-10-CM | POA: Diagnosis not present

## 2018-06-05 DIAGNOSIS — R252 Cramp and spasm: Secondary | ICD-10-CM | POA: Diagnosis not present

## 2018-06-05 DIAGNOSIS — F17210 Nicotine dependence, cigarettes, uncomplicated: Secondary | ICD-10-CM | POA: Diagnosis not present

## 2018-06-05 MED ORDER — VARENICLINE TARTRATE 0.5 MG X 11 & 1 MG X 42 PO MISC: ORAL | 0 refills | Status: DC

## 2018-06-05 MED ORDER — MAGNESIUM 400 MG PO CAPS: 400.0000 mg | ORAL_CAPSULE | Freq: Every day | ORAL | 1 refills | Status: DC

## 2018-06-05 NOTE — Patient Instructions (Signed)
Chronic Kidney Disease, Adult Chronic kidney disease (CKD) happens when the kidneys are damaged over a long period of time. The kidneys are two organs that help with:  Getting rid of waste and extra fluid from the blood.  Making hormones that maintain the amount of fluid in your tissues and blood vessels.  Making sure that the body has the right amount of fluids and chemicals. Most of the time, CKD does not go away, but it can usually be controlled. Steps must be taken to slow down the kidney damage or to stop it from getting worse. If this is not done, the kidneys may stop working. Follow these instructions at home: Medicines  Take over-the-counter and prescription medicines only as told by your doctor. You may need to change the amount of medicines you take.  Do not take any new medicines unless your doctor says it is okay. Many medicines can make your kidney damage worse.  Do not take any vitamin and supplements unless your doctor says it is okay. Many vitamins and supplements can make your kidney damage worse. General instructions  Follow a diet as told by your doctor. You may need to stay away from: ? Alcohol. ? Salty foods. ? Foods that are high in:  Potassium.  Calcium.  Protein.  Do not use any products that contain nicotine or tobacco, such as cigarettes and e-cigarettes. If you need help quitting, ask your doctor.  Keep track of your blood pressure at home. Tell your doctor about any changes.  If you have diabetes, keep track of your blood sugar as told by your doctor.  Try to stay at a healthy weight. If you need help, ask your doctor.  Exercise at least 30 minutes a day, 5 days a week.  Stay up-to-date with your shots (immunizations) as told by your doctor.  Keep all follow-up visits as told by your doctor. This is important. Contact a doctor if:  Your symptoms get worse.  You have new symptoms. Get help right away if:  You have symptoms of end-stage  kidney disease. These may include: ? Headaches. ? Numbness in your hands or feet. ? Easy bruising. ? Having hiccups often. ? Chest pain. ? Shortness of breath. ? Stopping of menstrual periods in women.  You have a fever.  You have very little pee (urine).  You have pain or bleeding when you pee. Summary  Chronic kidney disease (CKD) happens when the kidneys are damaged over a long period of time.  Most of the time, this condition does not go away, but it can usually be controlled. Steps must be taken to slow down the kidney damage or to stop it from getting worse.  Treatment may include a combination of medicines and lifestyle changes. This information is not intended to replace advice given to you by your health care provider. Make sure you discuss any questions you have with your health care provider. Document Released: 05/10/2009 Document Revised: 03/20/2016 Document Reviewed: 03/20/2016 Elsevier Interactive Patient Education  2019 [employer]  

## 2018-06-05 NOTE — Progress Notes (Signed)
Subjective:     [name] , male    DOB: 1950/08/21 , 68 y.o.   MRN: [id number]   Chief Complaint  Patient presents with  . Hypertension    HPI  Hypertension  This is a chronic problem. The current episode started more than 1 year ago. The problem has been gradually improving since onset. The problem is controlled. Pertinent negatives include no blurred vision, chest pain, palpitations or shortness of breath. Risk factors for coronary artery disease include male gender, sedentary lifestyle and smoking/tobacco exposure.     Past Medical History:  Diagnosis Date  . Arthritis   . Bullous emphysema (HCC)    Severe Bilateral  . Cocaine abuse ([place])   . Colitis    with bleeding  . COPD (chronic obstructive pulmonary disease) ([place])   . Dyspnea   . GERD (gastroesophageal reflux disease)   . Headache    sinusitis  . Hypertension   . LBP (low back pain)    now chronic  . Lumbago   . Lung mass    Right Upper Lobe pleural based mass, negative  on PET Scan  . Neck pain    nerve pain   . Pneumonia    hx  . Spine pain    nerve  . Therapeutic drug monitoring   . Tobacco abuse      Family History  Problem Relation Age of Onset  . Cancer Father   . Cervical cancer Sister   . Emphysema Mother      Current Outpatient Medications:  .  albuterol (PROVENTIL HFA;VENTOLIN HFA) 108 (90 BASE) MCG/ACT inhaler, Inhale 2 puffs into the lungs every 6 (six) hours as needed for wheezing or shortness of breath. Shortness of breath, Disp: , Rfl:  .  budesonide-formoterol (SYMBICORT) 80-4.5 MCG/ACT inhaler, Inhale 2 puffs into the lungs 2 (two) times daily., Disp: 1 Inhaler, Rfl: 11 .  buPROPion (WELLBUTRIN XL) 150 MG 24 hr tablet, Take 1 tablet by mouth daily., Disp: , Rfl:  .  ipratropium-albuterol (DUONEB) 0.5-2.5 (3) MG/3ML SOLN, Take 3 mLs by nebulization every 4 (four) hours as needed (shortness of breath and wheezing, coughing)., Disp: 360 mL, Rfl: 1 .  losartan  (COZAAR) 50 MG tablet, TAKE 1 TABLET BY MOUTH DAILY, Disp: 90 tablet, Rfl: 2 .  LYRICA 50 MG capsule, TAKE 1 CAPSULE BY MOUTH TWICE DAILY, Disp: 60 capsule, Rfl: 3 .  montelukast (SINGULAIR) 10 MG tablet, every evening., Disp: , Rfl: 1 .  omeprazole (PRILOSEC) 40 MG capsule, TAKE 1 CAPSULE BY MOUTH EVERY DAY BEFORE A MEAL, Disp: 90 capsule, Rfl: 2 .  Respiratory Therapy Supplies (FLUTTER) DEVI, Use as directed, Disp: 1 each, Rfl: 0 .  Tiotropium Bromide Monohydrate (SPIRIVA RESPIMAT) 2.5 MCG/ACT AERS, Inhale 2 puffs into the lungs daily., Disp: , Rfl:  .  benzonatate (TESSALON) 100 MG capsule, Take 1 capsule (100 mg total) by mouth 3 (three) times daily as needed for cough. (Patient not taking: Reported on 06/05/2018), Disp: 30 capsule, Rfl: 0 .  cyclobenzaprine (FLEXERIL) 10 MG tablet, Take 10 mg by mouth as needed for muscle spasms., Disp: , Rfl:  .  HYDROcodone-homatropine (HYDROMET) 5-1.5 MG/5ML syrup, Take 5 mLs by mouth every 6 (six) hours as needed. (Patient not taking: Reported on 06/05/2018), Disp: 120 mL, Rfl: 0 .  Magnesium 400 MG CAPS, Take 400 mg by mouth daily., Disp: 30 capsule, Rfl: 1 .  varenicline (CHANTIX STARTING MONTH PAK) 0.5 MG X 11 & 1 MG  X 42 tablet, Take one 0.5 mg tablet by mouth once daily for 3 days, then increase to one 0.5 mg tablet twice daily for 4 days, then increase to one 1 mg tablet twice daily., Disp: 53 tablet, Rfl: 0   Allergies  Allergen Reactions  . Penicillins Anaphylaxis and Hives    Has patient had a PCN reaction causing immediate rash, facial/tongue/throat swelling, SOB or lightheadedness with hypotension: No Has patient had a PCN reaction causing severe rash involving mucus membranes or skin necrosis: No Has patient had a PCN reaction that required hospitalization No Has patient had a PCN reaction occurring within the last 10 years: No If all of the above answers are "NO", then may proceed with Cephalosporin use.       Review of Systems   Constitutional: Negative.   Eyes: Negative for blurred vision.  Respiratory: Positive for wheezing (reports he was recently seen by Pulm, treated for copd exacerbation. He was prescribed prednisone. He is still coughing alot. Unable to rest at night. ). Negative for shortness of breath.   Cardiovascular: Negative.  Negative for chest pain and palpitations.  Gastrointestinal: Negative.   Neurological: Negative.   Psychiatric/Behavioral: Negative.      Today's Vitals   06/05/18 1043  BP: 132/70  Pulse: 85  Temp: 98 F (36.7 C)  TempSrc: Oral  SpO2: 97%  Weight: 184 lb 12.8 oz (83.8 kg)  Height: 5\' 10"  (1.778 m)   Body mass index is 26.52 kg/m.   Objective:  Physical Exam Vitals signs and nursing note reviewed.  Constitutional:      Appearance: Normal appearance.  Cardiovascular:     Rate and Rhythm: Normal rate and regular rhythm.     Heart sounds: Normal heart sounds.  Pulmonary:     Effort: Pulmonary effort is normal.     Breath sounds: Wheezing present.     Comments: Few, scattered wheezes Skin:    General: Skin is warm.  Neurological:     General: No focal deficit present.     Mental Status: He is alert.  Psychiatric:        Mood and Affect: Mood normal.         Assessment And Plan:     1. Parenchymal renal hypertension, stage 1 through stage 4 or unspecified chronic kidney disease  Controlled. He will continue with current meds. He is encouraged to avoid adding salt to his foods. He will rto in six months for his next physical examination.   2. Chronic renal disease, stage II  Chronic. I will check a GFR, Cr today.   3. Chronic obstructive pulmonary disease with acute exacerbation (HCC)  Resolving. He is encouraged to complete full course of prednisone as per Pulmonary.   4. Cigarette nicotine dependence without complication  We discussed smoking cessation for greater than 5 minutes. He is now ready to start Chantix. He was advised of starting pack  dosing regimen. Possible side effects were discussed with the patient. He will rto in 4-6 weeks for re-evaluation. This can be done as a virtual visit. All questions were answered to his satisfaction.   5. Leg cramps  He is encouraged to start magnesium supplementation. He will let me know if his symptoms persist.   [name], MD    THE PATIENT IS ENCOURAGED TO PRACTICE SOCIAL DISTANCING DUE TO THE COVID-19 PANDEMIC.

## 2018-06-06 ENCOUNTER — Other Ambulatory Visit: Payer: Self-pay | Admitting: Internal Medicine

## 2018-06-06 MED ORDER — CYCLOBENZAPRINE HCL 10 MG PO TABS: 10.0000 mg | ORAL_TABLET | ORAL | 0 refills | Status: DC | PRN

## 2018-06-07 MED FILL — CYCLOBENZAPRINE HCL 10 MG T: 10 | 30 days supply | Qty: 30 | Fill #0

## 2018-06-17 ENCOUNTER — Other Ambulatory Visit: Payer: Self-pay

## 2018-06-17 MED ORDER — VARENICLINE TARTRATE 0.5 MG X 11 & 1 MG X 42 PO MISC: ORAL | 0 refills | Status: DC

## 2018-06-17 MED FILL — CHANTIX STARTING MONTH BOX: 0.5 MG X 11 | 30 days supply | Qty: 53 | Fill #0

## 2018-06-17 NOTE — Telephone Encounter (Signed)
The pt was advised to do an e-visit to be evaluated because he is having some shortness of breath, fever 99.6, and saying that his lung hurt when he coughs.

## 2018-07-11 ENCOUNTER — Other Ambulatory Visit: Payer: Self-pay | Admitting: Internal Medicine

## 2018-07-11 MED FILL — IPRAT-ALBUT 0.5-3(2.5) MG/3: 0.5-2.5 (3) | 24 days supply | Qty: 270 | Fill #0

## 2018-07-15 ENCOUNTER — Telehealth: Payer: Self-pay

## 2018-07-15 NOTE — Telephone Encounter (Signed)
The pt called and confirmed his appointment for 07/18/2018

## 2018-07-18 ENCOUNTER — Ambulatory Visit: Payer: 59 | Admitting: Internal Medicine

## 2018-07-18 ENCOUNTER — Other Ambulatory Visit: Payer: Self-pay

## 2018-07-18 ENCOUNTER — Encounter: Payer: Self-pay | Admitting: Internal Medicine

## 2018-07-18 VITALS — BP 124/82 | HR 84 | Temp 97.7°F | Ht 70.0 in | Wt 182.2 lb

## 2018-07-18 DIAGNOSIS — R7309 Other abnormal glucose: Secondary | ICD-10-CM

## 2018-07-18 DIAGNOSIS — N182 Chronic kidney disease, stage 2 (mild): Secondary | ICD-10-CM | POA: Diagnosis not present

## 2018-07-18 DIAGNOSIS — R1032 Left lower quadrant pain: Secondary | ICD-10-CM

## 2018-07-18 DIAGNOSIS — I129 Hypertensive chronic kidney disease with stage 1 through stage 4 chronic kidney disease, or unspecified chronic kidney disease: Secondary | ICD-10-CM

## 2018-07-18 DIAGNOSIS — F17200 Nicotine dependence, unspecified, uncomplicated: Secondary | ICD-10-CM | POA: Diagnosis not present

## 2018-07-18 DIAGNOSIS — R399 Unspecified symptoms and signs involving the genitourinary system: Secondary | ICD-10-CM

## 2018-07-18 DIAGNOSIS — Z79899 Other long term (current) drug therapy: Secondary | ICD-10-CM | POA: Diagnosis not present

## 2018-07-18 LAB — POCT URINALYSIS DIPSTICK
Glucose, UA: NEGATIVE
Leukocytes, UA: NEGATIVE
Nitrite, UA: NEGATIVE
Protein, UA: POSITIVE — AB
Spec Grav, UA: 1.02 (ref 1.010–1.025)
Urobilinogen, UA: 0.2 E.U./dL
pH, UA: 7 (ref 5.0–8.0)

## 2018-07-19 LAB — BMP8+EGFR
BUN/Creatinine Ratio: 10 (ref 10–24)
BUN: 11 mg/dL (ref 8–27)
CO2: 21 mmol/L (ref 20–29)
Calcium: 10.2 mg/dL (ref 8.6–10.2)
Chloride: 101 mmol/L (ref 96–106)
Creatinine, Ser: 1.12 mg/dL (ref 0.76–1.27)
GFR calc Af Amer: 79 mL/min/{1.73_m2} (ref >59)
GFR calc non Af Amer: 68 mL/min/{1.73_m2} (ref >59)
Glucose: 96 mg/dL (ref 65–99)
Potassium: 4.5 mmol/L (ref 3.5–5.2)
Sodium: 141 mmol/L (ref 134–144)

## 2018-07-19 LAB — CBC
Hematocrit: 46.1 % (ref 37.5–51.0)
Hemoglobin: 16.2 g/dL (ref 13.0–17.7)
MCH: 29.3 pg (ref 26.6–33.0)
MCHC: 35.1 g/dL (ref 31.5–35.7)
MCV: 84 fL (ref 79–97)
Platelets: 257 10*3/uL (ref 150–450)
RBC: 5.52 x10E6/uL (ref 4.14–5.80)
RDW: 14.5 % (ref 11.6–15.4)
WBC: 11 10*3/uL — ABNORMAL HIGH (ref 3.4–10.8)

## 2018-07-19 LAB — HEMOGLOBIN A1C
Est. average glucose Bld gHb Est-mCnc: 137 mg/dL
Hgb A1c MFr Bld: 6.4 % — ABNORMAL HIGH (ref 4.8–5.6)

## 2018-07-20 LAB — URINE CULTURE: Organism ID, Bacteria: NO GROWTH

## 2018-07-21 ENCOUNTER — Encounter: Payer: Self-pay | Admitting: Internal Medicine

## 2018-07-21 NOTE — Progress Notes (Signed)
Subjective:     [name] , male    DOB: Sep 13, 1950 , 68 y.o.   MRN: [id number]   Chief Complaint  Patient presents with  . Chantix f/u  . Abdominal Pain    HPI  He is here today for f/u smoking cessation.  HE has been taking Chantix 58m twice daily. Stopped it three days ago due to abdominal pain. He is down to 1/2ppd. He has yet to pick a quit date.   Abdominal Pain  This is a new problem. The current episode started in the past 7 days. The onset quality is gradual. The problem occurs constantly. The problem has been unchanged. The pain is located in the LLQ. The pain is at a severity of 8/10. The pain is moderate. The quality of the pain is dull and aching. The abdominal pain does not radiate. Pertinent negatives include no arthralgias, belching, constipation, diarrhea, headaches or nausea. The pain is relieved by nothing. The treatment provided no relief.   Pain is not relieved with bowel movement. He reports he has had similar sx in the past when he had a "blockage".  This was many years ago, while under the care of Dr. SKarlton Lemon He reports Dr. MCollene Mares his gastroenterologist, knows what happened. He denies having surgical intervention at that time. HE reports having a good appetite. No fever/chills/nausea/vomiting. Pain is exacerbated by movement.   Past Medical History:  Diagnosis Date  . Arthritis   . Bullous emphysema (HCC)    Severe Bilateral  . Cocaine abuse ([place]   . Colitis    with bleeding  . [place]   . Dyspnea   . GERD (gastroesophageal reflux disease)   . Headache    sinusitis  . Hypertension   . LBP (low back pain)    now chronic  . Lumbago   . Lung mass    Right Upper Lobe pleural based mass, negative  on PET Scan  . Neck pain    nerve pain   . Pneumonia    hx  . Spine pain    nerve  . Therapeutic drug monitoring   . Tobacco abuse      Family History  Problem Relation Age of Onset  .  Cancer Father   . Cervical cancer Sister   . Emphysema Mother      Current Outpatient Medications:  .  albuterol (PROVENTIL HFA;VENTOLIN HFA) 108 (90 BASE) MCG/ACT inhaler, Inhale 2 puffs into the lungs every 6 (six) hours as needed for wheezing or shortness of breath. Shortness of breath, Disp: , Rfl:  .  budesonide-formoterol (SYMBICORT) 80-4.5 MCG/ACT inhaler, Inhale 2 puffs into the lungs 2 (two) times daily., Disp: 1 Inhaler, Rfl: 11 .  buPROPion (WELLBUTRIN XL) 150 MG 24 hr tablet, Take 1 tablet by mouth daily., Disp: , Rfl:  .  cyclobenzaprine (FLEXERIL) 10 MG tablet, Take 1 tablet (10 mg total) by mouth as needed for muscle spasms., Disp: 30 tablet, Rfl: 0 .  ipratropium-albuterol (DUONEB) 0.5-2.5 (3) MG/3ML SOLN, INHALE 1 VIAL (3MLS) USING NEBULIZER 4 TIMES PER DAY, Disp: 270 mL, Rfl: 11 .  losartan (COZAAR) 50 MG tablet, TAKE 1 TABLET BY MOUTH DAILY, Disp: 90 tablet, Rfl: 2 .  LYRICA 50 MG capsule, TAKE 1 CAPSULE BY MOUTH TWICE DAILY, Disp: 60 capsule, Rfl: 3 .  Magnesium 400 MG CAPS, Take 400 mg by mouth daily., Disp: 30 capsule, Rfl: 1 .  montelukast (SINGULAIR) 10 MG tablet, every  evening., Disp: , Rfl: 1 .  omeprazole (PRILOSEC) 40 MG capsule, TAKE 1 CAPSULE BY MOUTH EVERY DAY BEFORE A MEAL, Disp: 90 capsule, Rfl: 2 .  Respiratory Therapy Supplies (FLUTTER) DEVI, Use as directed, Disp: 1 each, Rfl: 0 .  Tiotropium Bromide Monohydrate (SPIRIVA RESPIMAT) 2.5 MCG/ACT AERS, Inhale 2 puffs into the lungs daily., Disp: , Rfl:  .  varenicline (CHANTIX STARTING MONTH PAK) 0.5 MG X 11 & 1 MG X 42 tablet, Take one 0.5 mg tablet by mouth once daily for 3 days, then increase to one 0.5 mg tablet twice daily for 4 days, then increase to one 1 mg tablet twice daily., Disp: 53 tablet, Rfl: 0   Allergies  Allergen Reactions  . Penicillins Anaphylaxis and Hives    Has patient had a PCN reaction causing immediate rash, facial/tongue/throat swelling, SOB or lightheadedness with hypotension:  No Has patient had a PCN reaction causing severe rash involving mucus membranes or skin necrosis: No Has patient had a PCN reaction that required hospitalization No Has patient had a PCN reaction occurring within the last 10 years: No If all of the above answers are "NO", then may proceed with Cephalosporin use.       Review of Systems  Constitutional: Negative.   Respiratory: Negative.   Cardiovascular: Negative.   Gastrointestinal: Positive for abdominal pain. Negative for blood in stool, constipation, diarrhea, nausea and rectal pain.  Genitourinary: Positive for urgency.  Musculoskeletal: Negative for arthralgias.  Neurological: Negative.  Negative for headaches.  Psychiatric/Behavioral: Negative.      Today's Vitals   07/18/18 1100  BP: 124/82  Pulse: 84  Temp: 97.7 F (36.5 C)  TempSrc: Oral  Weight: 182 lb 3.2 oz (82.6 kg)  Height: _0  (1.778 m)  PainSc: 3   PainLoc: Abdomen   Body mass index is 26.14 kg/m.   Objective:  Physical Exam Vitals signs and nursing note reviewed.  Constitutional:      Appearance: Normal appearance.  Cardiovascular:     Rate and Rhythm: Normal rate and regular rhythm.     Heart sounds: Normal heart sounds.  Pulmonary:     Effort: Pulmonary effort is normal.     Breath sounds: Normal breath sounds.  Abdominal:     General: Abdomen is flat. Bowel sounds are normal. There is no distension.     Palpations: Abdomen is soft.     Tenderness: There is abdominal tenderness in the left lower quadrant. There is no guarding or rebound.  Skin:    General: Skin is warm.  Neurological:     General: No focal deficit present.     Mental Status: He is alert.  Psychiatric:        Mood and Affect: Mood normal.         Assessment And Plan:     1. Tobacco dependence syndrome  He will resume Chantix 0.76m twice daily x 1 week, then 18mtwice daily. He agrees to pick a quit date within 3 weeks.   2. Left lower quadrant abdominal  pain  There is a possibility this is musculoskeletal, he does recall moving something heavy recently.  He is advised to go to ER for further evaluation should his sx persist. He may benefit from Ib-gard, one capsule tid prn for GI origin of his sx. He may get this over the counter. I will also schedule him for CT abdomen/pelvis for further evaluation should his sx persist.  Dr. ShRoland Earlecords reviewed during this encounter as  well.   3. Lower urinary tract symptoms  I will check urinalysis today.   - POCT Urinalysis Dipstick (81002) - CBC no Diff - Culture, Urine  4. Parenchymal renal hypertension, stage 1 through stage 4 or unspecified chronic kidney disease  Well controlled. He will continue with current meds. He is encouraged to avoid adding salt to his foods. Importance of regular exercise was discussed with the patient. - BMP8+EGFR  5. Chronic renal disease, stage II  Chronic. He is advised to stay well hydrated.   6. Other abnormal glucose  HIS A1C HAS BEEN ELEVATED IN THE PAST. I WILL CHECK AN A1C, BMET TODAY. HE WAS ENCOURAGED TO AVOID SUGARY BEVERAGES AND PROCESSED FOODS INCLUDNG BREADS, RICE AND PASTA.  - Hemoglobin A1c        [name], MD    THE PATIENT IS ENCOURAGED TO PRACTICE SOCIAL DISTANCING DUE TO THE COVID-19 PANDEMIC.

## 2018-07-25 ENCOUNTER — Other Ambulatory Visit: Payer: Self-pay

## 2018-07-25 DIAGNOSIS — R194 Change in bowel habit: Secondary | ICD-10-CM | POA: Diagnosis not present

## 2018-07-25 DIAGNOSIS — Z1211 Encounter for screening for malignant neoplasm of colon: Secondary | ICD-10-CM | POA: Diagnosis not present

## 2018-07-25 DIAGNOSIS — R1033 Periumbilical pain: Secondary | ICD-10-CM | POA: Diagnosis not present

## 2018-07-25 LAB — HEPATITIS C ANTIBODY: Hep C Virus Ab: <0.1 s/co ratio (ref 0.0–0.9)

## 2018-07-25 LAB — SPECIMEN STATUS REPORT

## 2018-07-25 MED FILL — GAVILYTE-G SOLUTION: 236 | 1 days supply | Qty: 4000 | Fill #0

## 2018-08-09 DIAGNOSIS — R194 Change in bowel habit: Secondary | ICD-10-CM | POA: Diagnosis not present

## 2018-08-09 DIAGNOSIS — Z1211 Encounter for screening for malignant neoplasm of colon: Secondary | ICD-10-CM | POA: Diagnosis not present

## 2018-08-09 DIAGNOSIS — D125 Benign neoplasm of sigmoid colon: Secondary | ICD-10-CM | POA: Diagnosis not present

## 2018-08-09 DIAGNOSIS — K635 Polyp of colon: Secondary | ICD-10-CM | POA: Diagnosis not present

## 2018-08-09 LAB — HM COLONOSCOPY

## 2018-08-13 ENCOUNTER — Other Ambulatory Visit: Payer: Self-pay | Admitting: Internal Medicine

## 2018-08-13 MED FILL — LOSARTAN POTASSIUM 50 MG TA: 50 | 90 days supply | Qty: 90 | Fill #1

## 2018-08-13 MED FILL — PREGABALIN 50 MG CAPS: 50 | 30 days supply | Qty: 60 | Fill #1

## 2018-08-14 ENCOUNTER — Encounter: Payer: Self-pay | Admitting: Internal Medicine

## 2018-08-22 MED FILL — SYMBICORT 160-4.5 MCG INH: 160-4.5 | 30 days supply | Qty: 10 | Fill #0

## 2018-09-18 ENCOUNTER — Telehealth: Payer: Self-pay

## 2018-09-18 NOTE — Telephone Encounter (Signed)
Called to schedule virtual appt with patient but he stated that he was going [facility] testing. Encouraged pt to call us with results or if his symptoms start to worsen

## 2018-09-30 MED FILL — OMEPRAZOLE DR 40 MG CAPSULE: 40 | 90 days supply | Qty: 90 | Fill #1

## 2018-09-30 MED FILL — SYMBICORT 160-4.5 MCG INH: 160-4.5 | 30 days supply | Qty: 10 | Fill #1

## 2018-09-30 MED FILL — MONTELUKAST SOD 10 MG TAB: 10 | 30 days supply | Qty: 30 | Fill #2

## 2018-09-30 MED FILL — IPRAT-ALBUT 0.5-3(2.5) MG/3: 0.5-2.5 (3) | 24 days supply | Qty: 270 | Fill #0

## 2018-09-30 MED FILL — SPIRIVA RESPIMAT 1.25 MCG I: 1.25 | 30 days supply | Qty: 4 | Fill #0

## 2018-09-30 MED FILL — CYCLOBENZAPRINE 10 MG TAB: 10 | 30 days supply | Qty: 30 | Fill #0

## 2018-10-29 ENCOUNTER — Encounter: Payer: Self-pay | Admitting: Internal Medicine

## 2018-10-29 ENCOUNTER — Ambulatory Visit: Payer: 59 | Admitting: Internal Medicine

## 2018-10-29 ENCOUNTER — Other Ambulatory Visit: Payer: Self-pay

## 2018-10-29 VITALS — BP 130/90 | HR 90 | Temp 97.6°F | Ht 70.0 in | Wt 189.0 lb

## 2018-10-29 DIAGNOSIS — F17210 Nicotine dependence, cigarettes, uncomplicated: Secondary | ICD-10-CM | POA: Diagnosis not present

## 2018-10-29 DIAGNOSIS — N182 Chronic kidney disease, stage 2 (mild): Secondary | ICD-10-CM | POA: Diagnosis not present

## 2018-10-29 DIAGNOSIS — I129 Hypertensive chronic kidney disease with stage 1 through stage 4 chronic kidney disease, or unspecified chronic kidney disease: Secondary | ICD-10-CM

## 2018-10-29 DIAGNOSIS — R0982 Postnasal drip: Secondary | ICD-10-CM

## 2018-10-29 DIAGNOSIS — J411 Mucopurulent chronic bronchitis: Secondary | ICD-10-CM

## 2018-10-29 DIAGNOSIS — Z23 Encounter for immunization: Secondary | ICD-10-CM

## 2018-10-29 MED ORDER — PREDNISONE 10 MG (21) PO TBPK: ORAL_TABLET | ORAL | 0 refills | Status: DC

## 2018-10-29 MED FILL — predniSONE 10 MG TABS: 10 | 6 days supply | Qty: 21 | Fill #0

## 2018-10-29 NOTE — Patient Instructions (Signed)
Chronic Bronchitis, Adult Chronic bronchitis is long-lasting inflammation of the tubes that carry air into your lungs (bronchial tubes). This is inflammation that occurs:  On most days of the week.  For at least three months at a time.  Over a period of two years in a row. When the bronchial tubes are inflamed, they start to produce mucus. The inflammation and buildup of mucus make it more difficult to breathe. Chronic bronchitis is usually a permanent problem. It is one type of chronic obstructive pulmonary disease (COPD). People with chronic bronchitis are more likely to get frequent colds or respiratory infections. What are the causes? Chronic bronchitis most often occurs in people who:  Have chronic, severe asthma.  Have a history of smoking.  Have asthma and smoke.  Have certain lung diseases.  Have had long-term exposure to certain irritating fumes or chemicals. What are the signs or symptoms? Symptoms of chronic bronchitis may include:  A cough that brings up mucus (productive cough).  Shortness of breath.  Loud breathing (wheezing).  Chest discomfort.  Frequent (recurring) colds or respiratory infections. Certain things can trigger chronic bronchitis symptoms or make them worse, such as:  Infections.  Stopping certain medicines.  Smoking.  Exposure to chemicals. How is this diagnosed? This condition may be diagnosed based on:  Your symptoms and medical history.  A physical exam.  A chest X-ray.  Lung (pulmonary) function tests. How is this treated? There is no cure for chronic bronchitis, but treatment can help control your symptoms. Treatment may include:  Using a cool mist vaporizer or humidifier to make it easier to breathe.  Drinking more fluids. Drinking more makes your mucus thinner, which may make it easier to breathe.  Lifestyle changes, such as eating a healthier diet and getting more exercise.  Medicines, such as: ? Inhalers to improve  air flow in and out of your lungs. ? Antibiotics to treat any bacterial infections you have, such as:  Lung infection (pneumonia).  Sinus infection.  A sudden, severe (acute) episode of bronchitis.  Oxygen therapy.  Preventing infections by keeping up to date on vaccinations, including the pneumonia and flu vaccines.  Pulmonary rehabilitation. This is a program that helps you manage your breathing problems and improve your quality of life. It may last for up to 4-12 weeks and may include exercise programs, education, counseling, and treatment support. Follow these instructions at home: Medicines  Take over-the-counter and prescription medicines only as told by your health care provider.  If you were prescribed an antibiotic medicine, take it as told by your health care provider. Do not stop taking the antibiotic even if you start to feel better. Preventing infections  Get vaccinations as told by your health care provider. Make sure you get a flu shot (influenza vaccine) every year.  Wash your hands often with soap and water. If soap and water are not available, use hand sanitizer.  Avoid contact with people who have symptoms of a cold or the flu. Managing symptoms   Do not smoke, and avoid secondhand smoke. Exposure to cigarette smoke or irritating chemicals will make bronchitis worse. If you smoke and you need help quitting, ask your health care provider. Quitting smoking will help your lungs heal faster.  Use an inhaler, cool mist vaporizer, or humidifier as told by your health care provider.  Avoid pollen, dust, animal dander, molds, smoke, and other things that cause shortness of breath or wheezing attacks.  Use oxygen therapy at home as directed. Follow   instructions from your health care provider about how to use oxygen safely and take precautions to prevent fire. Make sure you never smoke while using oxygen or allow others to smoke in your home.  Do not wait to get medical  care if you have any concerning symptoms or trouble breathing. Waiting could cause permanent injury and may be life threatening. General instructions  Talk with your health care provider about what activities are safe for you and about possible exercise routines. Regular exercise is very important to help you feel better.  Drink enough fluids to keep your urine pale yellow.  Keep all follow-up visits as told by your health care provider. This is important. Contact a health care provider if:  You have coughing or shortness of breath that gets worse.  You have muscle aches.  You have chest pain.  Your mucus seems to get thicker.  Your mucus changes from clear or white to yellow, green, gray, or bloody. Get help right away if:  Your usual medicines do not stop your wheezing.  You have severe difficulty breathing. These symptoms may represent a serious problem that is an emergency. Do not wait to see if the symptoms will go away. Get medical help right away. Call your local emergency services (911 in the [place] Do not drive yourself to the hospital. Summary  Chronic bronchitis is long-lasting inflammation of the tubes that carry air into your lungs (bronchial tubes).  Chronic bronchitis is usually a permanent problem. It is one type of chronic obstructive pulmonary disease (COPD).  There is no cure for chronic bronchitis, but treatment can help control your symptoms.  Do not smoke, and avoid secondhand smoke. Exposure to cigarette smoke or irritating chemicals will make bronchitis worse. This information is not intended to replace advice given to you by your health care provider. Make sure you discuss any questions you have with your health care provider. Document Released: 12/01/2005 Document Revised: 12/06/2017 Document Reviewed: 01/03/2017 Elsevier Patient Education  2020 [employer].

## 2018-11-10 NOTE — Progress Notes (Signed)
Subjective:     [name] , male    DOB: [birth year] , 68 y.o.   MRN: [id number]   Chief Complaint  Patient presents with  . Hypertension    follow up  . chect congestion    yellowoish phlegm    HPI  Hypertension This is a chronic problem. The current episode started more than 1 year ago. The problem has been gradually improving since onset. The problem is uncontrolled. Pertinent negatives include no blurred vision, chest pain, palpitations or shortness of breath. Risk factors for coronary artery disease include sedentary lifestyle. Past treatments include angiotensin blockers and diuretics. The current treatment provides moderate improvement.     Past Medical History:  Diagnosis Date  . Arthritis   . Bullous emphysema (HCC)    Severe Bilateral  . Cocaine abuse ([place])   . Colitis    with bleeding  . COPD (chronic obstructive pulmonary disease) ([place])   . Dyspnea   . GERD (gastroesophageal reflux disease)   . Headache    sinusitis  . Hypertension   . LBP (low back pain)    now chronic  . Lumbago   . Lung mass    Right Upper Lobe pleural based mass, negative  on PET Scan  . Neck pain    nerve pain   . Pneumonia    hx  . Spine pain    nerve  . Therapeutic drug monitoring   . Tobacco abuse      Family History  Problem Relation Age of Onset  . Cancer Father   . Cervical cancer Sister   . Emphysema Mother      Current Outpatient Medications:  .  albuterol (PROVENTIL HFA;VENTOLIN HFA) 108 (90 BASE) MCG/ACT inhaler, Inhale 2 puffs into the lungs every 6 (six) hours as needed for wheezing or shortness of breath. Shortness of breath, Disp: , Rfl:  .  budesonide-formoterol (SYMBICORT) 80-4.5 MCG/ACT inhaler, Inhale 2 puffs into the lungs 2 (two) times daily., Disp: 1 Inhaler, Rfl: 11 .  buPROPion (WELLBUTRIN XL) 150 MG 24 hr tablet, Take 1 tablet by mouth daily., Disp: , Rfl:  .  cyclobenzaprine (FLEXERIL) 10 MG tablet, Take 1 tablet (10 mg total)  by mouth as needed for muscle spasms., Disp: 30 tablet, Rfl: 0 .  ipratropium-albuterol (DUONEB) 0.5-2.5 (3) MG/3ML SOLN, INHALE 1 VIAL (3MLS) USING NEBULIZER 4 TIMES PER DAY, Disp: 270 mL, Rfl: 11 .  losartan (COZAAR) 50 MG tablet, TAKE 1 TABLET BY MOUTH DAILY, Disp: 90 tablet, Rfl: 2 .  LYRICA 50 MG capsule, TAKE 1 CAPSULE BY MOUTH TWICE DAILY, Disp: 60 capsule, Rfl: 3 .  Magnesium 400 MG CAPS, Take 400 mg by mouth daily., Disp: 30 capsule, Rfl: 1 .  montelukast (SINGULAIR) 10 MG tablet, every evening., Disp: , Rfl: 1 .  omeprazole (PRILOSEC) 40 MG capsule, TAKE 1 CAPSULE BY MOUTH EVERY DAY BEFORE A MEAL, Disp: 90 capsule, Rfl: 2 .  Plecanatide (TRULANCE) 3 MG TABS, Take by mouth., Disp: , Rfl:  .  Probiotic Product (RESTORA PO), Take by mouth., Disp: , Rfl:  .  SYMBICORT 160-4.5 MCG/ACT inhaler, INHALE 2 PUFFS BY MOUTH 2 TIMES EVERY DAY IN THE MORNING AND EVENING, Disp: 10.2 g, Rfl: 3 .  Tiotropium Bromide Monohydrate (SPIRIVA RESPIMAT) 2.5 MCG/ACT AERS, Inhale 2 puffs into the lungs daily., Disp: , Rfl:  .  varenicline (CHANTIX STARTING MONTH PAK) 0.5 MG X 11 & 1 MG X 42 tablet, Take one 0.5 mg tablet  by mouth once daily for 3 days, then increase to one 0.5 mg tablet twice daily for 4 days, then increase to one 1 mg tablet twice daily., Disp: 53 tablet, Rfl: 0 .  predniSONE (STERAPRED UNI-PAK 21 TAB) 10 MG (21) TBPK tablet, Use as directed; dispense as 6-day dose pack, Disp: 21 tablet, Rfl: 0 .  Respiratory Therapy Supplies (FLUTTER) DEVI, Use as directed, Disp: 1 each, Rfl: 0   Allergies  Allergen Reactions  . Penicillins Anaphylaxis and Hives    Has patient had a PCN reaction causing immediate rash, facial/tongue/throat swelling, SOB or lightheadedness with hypotension: No Has patient had a PCN reaction causing severe rash involving mucus membranes or skin necrosis: No Has patient had a PCN reaction that required hospitalization No Has patient had a PCN reaction occurring within the last  10 years: No If all of the above answers are "NO", then may proceed with Cephalosporin use.       Review of Systems  Constitutional: Negative.   HENT: Positive for postnasal drip.   Eyes: Negative for blurred vision.  Respiratory: Positive for cough. Negative for shortness of breath.   Cardiovascular: Negative.  Negative for chest pain and palpitations.  Gastrointestinal: Negative.   Neurological: Negative.   Psychiatric/Behavioral: Negative.      Today's Vitals   10/29/18 1102  BP: 130/90  Pulse: 90  Temp: 97.6 F (36.4 C)  TempSrc: Oral  SpO2: 95%  Weight: 189 lb (85.7 kg)  Height: 5\' 10"  (1.778 m)   Body mass index is 27.12 kg/m.   Objective:  Physical Exam Vitals signs and nursing note reviewed.  Constitutional:      Appearance: Normal appearance.  HENT:     Head: Normocephalic and atraumatic.     Right Ear: Hearing, tympanic membrane and ear canal normal.     Left Ear: Hearing, tympanic membrane and ear canal normal.     Mouth/Throat:     Mouth: Mucous membranes are moist.  Cardiovascular:     Rate and Rhythm: Normal rate and regular rhythm.     Heart sounds: Normal heart sounds.  Pulmonary:     Effort: Pulmonary effort is normal.     Breath sounds: Wheezing and rhonchi present.  Skin:    General: Skin is warm.  Neurological:     General: No focal deficit present.     Mental Status: He is alert.  Psychiatric:        Mood and Affect: Mood normal.         Assessment And Plan:     1. Parenchymal renal hypertension, stage 1 through stage 4 or unspecified chronic kidney disease  Fair control. He will continue with current meds. He is encouraged to avoid adding salt to his foods.   2. Chronic renal disease, stage II  Chronic. I will check GFR/Cr at her next visit.   3. Mucopurulent chronic bronchitis (HCC)  Chronic. He is encouraged to avoid dairy products. Also encouraged to use nebulizer as needed.  He was given rx prednisone dose pack to use if  his sx worsen over the next several weeks. I do not think abx are indicated at this time.   4. Cigarette nicotine dependence without complication  Chronic. He is encouraged to cut back on number of cigs smoked per day. He is encouraged to avoid smoking in his home and in his car.   5. Postnasal drip  He is encouraged to resume allergy meds since the seasons are changing.  6. Need for influenza vaccination  - Flu vaccine HIGH DOSE PF (Fluzone High dose)        [name], MD    THE PATIENT IS ENCOURAGED TO PRACTICE SOCIAL DISTANCING DUE TO THE COVID-19 PANDEMIC.

## 2018-11-29 ENCOUNTER — Other Ambulatory Visit: Payer: Self-pay | Admitting: Internal Medicine

## 2018-11-29 MED FILL — LOSARTAN POTASSIUM 50 MG TA: 50 | 90 days supply | Qty: 90 | Fill #2

## 2018-12-02 MED FILL — PREGABALIN 50 MG CAPS: 50 | 30 days supply | Qty: 60 | Fill #0

## 2018-12-02 NOTE — Telephone Encounter (Signed)
Please refill patient's prescription 

## 2019-01-20 MED FILL — SPIRIVA RESPIMAT 1.25 MCG I: 1.25 | 30 days supply | Qty: 4 | Fill #0

## 2019-01-29 ENCOUNTER — Encounter: Payer: 59 | Admitting: Internal Medicine

## 2019-01-29 ENCOUNTER — Ambulatory Visit: Payer: 59

## 2019-03-03 ENCOUNTER — Other Ambulatory Visit: Payer: Self-pay | Admitting: Internal Medicine

## 2019-03-03 MED FILL — LOSARTAN POTASSIUM 50 MG TA: 50 | 90 days supply | Qty: 90 | Fill #0

## 2019-03-03 MED FILL — SYMBICORT 160-4.5 MCG INH: 160-4.5 | 30 days supply | Qty: 10 | Fill #2

## 2019-03-03 MED FILL — PREGABALIN 50 MG CAPS: 50 | 30 days supply | Qty: 60 | Fill #1

## 2019-03-03 MED FILL — SPIRIVA RESPIMAT 1.25 MCG I: 1.25 | 30 days supply | Qty: 4 | Fill #1

## 2019-03-03 MED FILL — OMEPRAZOLE 40 MG CPDR: 40 | 90 days supply | Qty: 90 | Fill #2

## 2019-03-24 ENCOUNTER — Other Ambulatory Visit: Payer: Self-pay

## 2019-03-24 ENCOUNTER — Encounter: Payer: Self-pay | Admitting: Internal Medicine

## 2019-03-24 ENCOUNTER — Ambulatory Visit: Payer: 59 | Admitting: Internal Medicine

## 2019-03-24 VITALS — BP 120/74 | HR 78 | Temp 98.1°F | Ht 70.0 in | Wt 190.6 lb

## 2019-03-24 DIAGNOSIS — K5909 Other constipation: Secondary | ICD-10-CM | POA: Diagnosis not present

## 2019-03-24 DIAGNOSIS — Z23 Encounter for immunization: Secondary | ICD-10-CM | POA: Diagnosis not present

## 2019-03-24 DIAGNOSIS — I10 Essential (primary) hypertension: Secondary | ICD-10-CM | POA: Diagnosis not present

## 2019-03-24 DIAGNOSIS — Z0000 Encounter for general adult medical examination without abnormal findings: Secondary | ICD-10-CM | POA: Diagnosis not present

## 2019-03-24 DIAGNOSIS — Z716 Tobacco abuse counseling: Secondary | ICD-10-CM | POA: Diagnosis not present

## 2019-03-24 LAB — POCT URINALYSIS DIPSTICK
Bilirubin, UA: NEGATIVE
Blood, UA: NEGATIVE
Glucose, UA: NEGATIVE
Ketones, UA: NEGATIVE
Leukocytes, UA: NEGATIVE
Nitrite, UA: NEGATIVE
Protein, UA: NEGATIVE
Spec Grav, UA: 1.025 (ref 1.010–1.025)
Urobilinogen, UA: 0.2 E.U./dL
pH, UA: 5.5 (ref 5.0–8.0)

## 2019-03-24 LAB — POCT UA - MICROALBUMIN
Albumin/Creatinine Ratio, Urine, POC: <30
Creatinine, POC: 200 mg/dL
Microalbumin Ur, POC: 10 mg/L

## 2019-03-24 MED ORDER — PNEUMOCOCCAL 13-VAL CONJ VACC IM SUSP: 0.5000 mL | INTRAMUSCULAR | 0 refills | Status: AC

## 2019-03-24 NOTE — Progress Notes (Signed)
This visit occurred during the SARS-CoV-2 public health emergency.  Safety protocols were in place, including screening questions prior to the visit, additional usage of staff PPE, and extensive cleaning of exam room while observing appropriate contact time as indicated for disinfecting solutions.  Subjective:     [name] , male    DOB: Apr 12, 1950 , 69 y.o.   MRN: [id number]   Chief Complaint  Patient presents with  . Annual Exam  . Immunizations    pneumonia  . Hypertension    HPI  He is here today for a full physical examination. He has no specific concerns or complaints at this time. He does plan to get COVID vaccine. He is currently on a waiting list.   Hypertension This is a chronic problem. The current episode started more than 1 year ago. The problem has been gradually improving since onset. The problem is controlled. Pertinent negatives include no blurred vision, chest pain, palpitations or shortness of breath. Risk factors for coronary artery disease include smoking/tobacco exposure and sedentary lifestyle. Past treatments include angiotensin blockers. The current treatment provides moderate improvement.     Past Medical History:  Diagnosis Date  . Arthritis   . Bullous emphysema (HCC)    Severe Bilateral  . Cocaine abuse ([place])   . Colitis    with bleeding  . COPD (chronic obstructive pulmonary disease) ([place])   . Dyspnea   . GERD (gastroesophageal reflux disease)   . Headache    sinusitis  . Hypertension   . LBP (low back pain)    now chronic  . Lumbago   . Lung mass    Right Upper Lobe pleural based mass, negative  on PET Scan  . Neck pain    nerve pain   . Pneumonia    hx  . Spine pain    nerve  . Therapeutic drug monitoring   . Tobacco abuse      Family History  Problem Relation Age of Onset  . Cancer Father   . Cervical cancer Sister   . Emphysema Mother      Current Outpatient Medications:  .  albuterol (PROVENTIL  HFA;VENTOLIN HFA) 108 (90 BASE) MCG/ACT inhaler, Inhale 2 puffs into the lungs every 6 (six) hours as needed for wheezing or shortness of breath. Shortness of breath, Disp: , Rfl:  .  cyclobenzaprine (FLEXERIL) 10 MG tablet, Take 1 tablet (10 mg total) by mouth as needed for muscle spasms., Disp: 30 tablet, Rfl: 0 .  ipratropium-albuterol (DUONEB) 0.5-2.5 (3) MG/3ML SOLN, INHALE 1 VIAL (3MLS) USING NEBULIZER 4 TIMES PER DAY, Disp: 270 mL, Rfl: 11 .  losartan (COZAAR) 50 MG tablet, TAKE 1 TABLET BY MOUTH DAILY, Disp: 90 tablet, Rfl: 2 .  Magnesium 400 MG CAPS, Take 400 mg by mouth daily., Disp: 30 capsule, Rfl: 1 .  omeprazole (PRILOSEC) 40 MG capsule, TAKE 1 CAPSULE BY MOUTH EVERY DAY BEFORE A MEAL, Disp: 90 capsule, Rfl: 2 .  pregabalin (LYRICA) 50 MG capsule, TAKE 1 CAPSULE BY MOUTH TWICE DAILY, Disp: 60 capsule, Rfl: 3 .  Probiotic Product (RESTORA PO), Take by mouth., Disp: , Rfl:  .  Respiratory Therapy Supplies (FLUTTER) DEVI, Use as directed, Disp: 1 each, Rfl: 0 .  SYMBICORT 160-4.5 MCG/ACT inhaler, INHALE 2 PUFFS BY MOUTH 2 TIMES EVERY DAY IN THE MORNING AND EVENING, Disp: 10.2 g, Rfl: 3 .  Tiotropium Bromide Monohydrate (SPIRIVA RESPIMAT) 2.5 MCG/ACT AERS, Inhale 2 puffs into the lungs daily., Disp: ,  Rfl:  .  buPROPion (WELLBUTRIN XL) 150 MG 24 hr tablet, Take 1 tablet by mouth daily., Disp: , Rfl:  .  montelukast (SINGULAIR) 10 MG tablet, every evening., Disp: , Rfl: 1 .  Plecanatide (TRULANCE) 3 MG TABS, Take by mouth., Disp: , Rfl:  .  pneumococcal 13-valent conjugate vaccine (PREVNAR 13) SUSP injection, Inject 0.5 mLs into the muscle tomorrow at 10 am for 1 dose., Disp: 0.5 mL, Rfl: 0   Allergies  Allergen Reactions  . Penicillins Anaphylaxis and Hives    Has patient had a PCN reaction causing immediate rash, facial/tongue/throat swelling, SOB or lightheadedness with hypotension: No Has patient had a PCN reaction causing severe rash involving mucus membranes or skin necrosis:  No Has patient had a PCN reaction that required hospitalization No Has patient had a PCN reaction occurring within the last 10 years: No If all of the above answers are "NO", then may proceed with Cephalosporin use.      Men's preventive visit. Patient Health Questionnaire (PHQ-2) is    Office Visit from 10/29/2018 in [facility]  PHQ-2 Total Score  0    . Patient is on a healthy diet. Marital status: Married. Relevant history for alcohol use is:  Social History   Substance and Sexual Activity  Alcohol Use Yes   Comment: scotch once a week  . Relevant history for tobacco use is:  Social History   Tobacco Use  Smoking Status Current Every Day Smoker  . Packs/day: 0.50  . Years: 45.00  . Pack years: 22.50  . Types: Cigarettes  Smokeless Tobacco Never Used  .  Review of Systems  Constitutional: Negative.   HENT: Negative.   Eyes: Negative.  Negative for blurred vision.  Respiratory: Negative.  Negative for shortness of breath.   Cardiovascular: Negative.  Negative for chest pain and palpitations.  Gastrointestinal: Positive for constipation.       He reports having "terrible" constipation. This is a long-standing issue. No relief with OTC medications. He reports drinking at least 8 glasses of water daily.   Endocrine: Negative.   Genitourinary: Negative.   Musculoskeletal: Negative.   Skin: Negative.   Allergic/Immunologic: Negative.   Neurological: Negative.   Hematological: Negative.   Psychiatric/Behavioral: Negative.      Today's Vitals   03/24/19 1110  BP: 120/74  Pulse: 78  Temp: 98.1 F (36.7 C)  TempSrc: Oral  SpO2: 97%  Weight: 190 lb 9.6 oz (86.5 kg)  Height: '5\' 10"'  (1.778 m)  PainSc: 6   PainLoc: Foot   Body mass index is 27.35 kg/m.   Objective:  Physical Exam Vitals and nursing note reviewed.  Constitutional:      Appearance: Normal appearance.  HENT:     Head: Normocephalic and atraumatic.     Right Ear: Tympanic  membrane, ear canal and external ear normal.     Left Ear: Tympanic membrane, ear canal and external ear normal.     Nose:     Comments: Deferred, masked    Mouth/Throat:     Comments: Deferred, masked Eyes:     Extraocular Movements: Extraocular movements intact.     Conjunctiva/sclera: Conjunctivae normal.     Pupils: Pupils are equal, round, and reactive to light.  Cardiovascular:     Rate and Rhythm: Normal rate and regular rhythm.     Pulses: Normal pulses.     Heart sounds: Normal heart sounds.  Pulmonary:     Effort: Pulmonary effort is normal.  Comments: Decreased breath sounds bilaterally Chest:     Breasts:        Right: Normal. No swelling, bleeding, inverted nipple, mass or nipple discharge.        Left: Normal. No swelling, bleeding, inverted nipple, mass or nipple discharge.  Abdominal:     General: Abdomen is flat. Bowel sounds are normal.     Palpations: Abdomen is soft.  Genitourinary:    Comments: Deferred, per patient Musculoskeletal:        General: Normal range of motion.     Cervical back: Normal range of motion and neck supple.  Skin:    General: Skin is warm.  Neurological:     General: No focal deficit present.     Mental Status: He is alert.  Psychiatric:        Mood and Affect: Mood normal.        Behavior: Behavior normal.         Assessment And Plan:     1. Routine general medical examination at health care facility  A full exam was performed.  DRE deferred, as per patient's request. He prefers to have referral to Urology for prostate eam.  PATIENT IS ADVISED TO GET 30-45 MINUTES REGULAR EXERCISE NO LESS THAN FOUR TO FIVE DAYS PER WEEK - BOTH WEIGHTBEARING EXERCISES AND AEROBIC ARE RECOMMENDED.  HE IS ADVISED TO FOLLOW A HEALTHY DIET WITH AT LEAST SIX FRUITS/VEGGIES PER DAY, DECREASE INTAKE OF RED MEAT, AND TO INCREASE FISH INTAKE TO TWO DAYS PER WEEK.  MEATS/FISH SHOULD NOT BE FRIED, BAKED OR BROILED IS PREFERABLE.  I SUGGEST WEARING SPF  50 SUNSCREEN ON EXPOSED PARTS AND ESPECIALLY WHEN IN THE DIRECT SUNLIGHT FOR AN EXTENDED PERIOD OF TIME.  PLEASE AVOID FAST FOOD RESTAURANTS AND INCREASE YOUR WATER INTAKE.  - CMP14+EGFR - CBC - Lipid panel - Hemoglobin A1c - PSA  2. Essential hypertension, benign  Chronic, well controlled. He will continue with current meds. He is encouraged to avoid adding salt to his foods. EKG performed, NSR w/o acute changes. He will rto in six months for re-evaluation.   - POCT Urinalysis Dipstick (81002) - POCT UA - Microalbumin - EKG 12-Lead  3. Chronic constipation  He was given samples of Linzess 40m to take once daily. If this is not effective, I will increase his dose to 1441mdaily. He is also encouraged to start fiber supplement, like Metamucil once daily and advised to increase his water intake.   4. Immunization due  He was given rx Prevnar-13 to get at his local pharmacy. He is advised to wait at least two weeks before getting his first COVID vaccine.     5. Tobacco abuse counseling  Smoking cessation instruction/counseling given:  counseled patient on the dangers of tobacco use, advised patient to stop smoking, and reviewed strategies to maximize success.   RoMaximino GreenlandMD    THE PATIENT IS ENCOURAGED TO PRACTICE SOCIAL DISTANCING DUE TO THE COVID-19 PANDEMIC.

## 2019-03-24 NOTE — Patient Instructions (Signed)

## 2019-03-25 DIAGNOSIS — R55 Syncope and collapse: Secondary | ICD-10-CM | POA: Diagnosis not present

## 2019-03-25 DIAGNOSIS — R42 Dizziness and giddiness: Secondary | ICD-10-CM | POA: Diagnosis not present

## 2019-03-25 DIAGNOSIS — I959 Hypotension, unspecified: Secondary | ICD-10-CM | POA: Diagnosis not present

## 2019-03-25 DIAGNOSIS — R110 Nausea: Secondary | ICD-10-CM | POA: Diagnosis not present

## 2019-03-25 DIAGNOSIS — R52 Pain, unspecified: Secondary | ICD-10-CM | POA: Diagnosis not present

## 2019-03-25 LAB — PSA: Prostate Specific Ag, Serum: 0.8 ng/mL (ref 0.0–4.0)

## 2019-03-25 LAB — CBC
Hematocrit: 52.4 % — ABNORMAL HIGH (ref 37.5–51.0)
Hemoglobin: 17.6 g/dL (ref 13.0–17.7)
MCH: 29.3 pg (ref 26.6–33.0)
MCHC: 33.6 g/dL (ref 31.5–35.7)
MCV: 87 fL (ref 79–97)
Platelets: 259 10*3/uL (ref 150–450)
RBC: 6.01 x10E6/uL — ABNORMAL HIGH (ref 4.14–5.80)
RDW: 14.3 % (ref 11.6–15.4)
WBC: 8.6 10*3/uL (ref 3.4–10.8)

## 2019-03-25 LAB — CMP14+EGFR
ALT: 24 IU/L (ref 0–44)
AST: 17 IU/L (ref 0–40)
Albumin/Globulin Ratio: 1.4 (ref 1.2–2.2)
Albumin: 4.6 g/dL (ref 3.8–4.8)
Alkaline Phosphatase: 124 IU/L — ABNORMAL HIGH (ref 39–117)
BUN/Creatinine Ratio: 13 (ref 10–24)
BUN: 15 mg/dL (ref 8–27)
Bilirubin Total: 0.6 mg/dL (ref 0.0–1.2)
CO2: 23 mmol/L (ref 20–29)
Calcium: 10.2 mg/dL (ref 8.6–10.2)
Chloride: 104 mmol/L (ref 96–106)
Creatinine, Ser: 1.16 mg/dL (ref 0.76–1.27)
GFR calc Af Amer: 75 mL/min/{1.73_m2} (ref >59)
GFR calc non Af Amer: 65 mL/min/{1.73_m2} (ref >59)
Globulin, Total: 3.2 g/dL (ref 1.5–4.5)
Glucose: 95 mg/dL (ref 65–99)
Potassium: 4.8 mmol/L (ref 3.5–5.2)
Sodium: 141 mmol/L (ref 134–144)
Total Protein: 7.8 g/dL (ref 6.0–8.5)

## 2019-03-25 LAB — LIPID PANEL
Chol/HDL Ratio: 4 ratio (ref 0.0–5.0)
Cholesterol, Total: 216 mg/dL — ABNORMAL HIGH (ref 100–199)
HDL: 54 mg/dL (ref >39)
LDL Chol Calc (NIH): 144 mg/dL — ABNORMAL HIGH (ref 0–99)
Triglycerides: 99 mg/dL (ref 0–149)
VLDL Cholesterol Cal: 18 mg/dL (ref 5–40)

## 2019-03-25 LAB — HEMOGLOBIN A1C
Est. average glucose Bld gHb Est-mCnc: 131 mg/dL
Hgb A1c MFr Bld: 6.2 % — ABNORMAL HIGH (ref 4.8–5.6)

## 2019-03-26 ENCOUNTER — Telehealth: Payer: Self-pay

## 2019-03-26 NOTE — Telephone Encounter (Signed)
This sounds like he had a cardiac event - b/c of n/v. Pls cut back on tobacco use. Needs to go to ER for repeat sx.Is he taking aspirin 81mg ?

## 2019-03-26 NOTE — Telephone Encounter (Signed)
The pt said that he had left side chest pain, he said he had a muscle spasm,  EMS came and did an ekg and it turned out alright.  The pt said that his pulse was low and that he had slurred speech, upset stomach, threw up.  The pt said he feels much better, he's good and he feels fine.  The pt said if he has chest pain again that he will go to the hospital.

## 2019-03-28 NOTE — Telephone Encounter (Signed)
The patient said yes he uses a 81 mg aspirin daily.

## 2019-04-22 DIAGNOSIS — R350 Frequency of micturition: Secondary | ICD-10-CM | POA: Diagnosis not present

## 2019-04-22 DIAGNOSIS — N401 Enlarged prostate with lower urinary tract symptoms: Secondary | ICD-10-CM | POA: Diagnosis not present

## 2019-04-22 DIAGNOSIS — R3914 Feeling of incomplete bladder emptying: Secondary | ICD-10-CM | POA: Diagnosis not present

## 2019-04-22 DIAGNOSIS — R3912 Poor urinary stream: Secondary | ICD-10-CM | POA: Diagnosis not present

## 2019-04-22 MED FILL — TAMSULOSIN HCL 0.4 MG CAP: 0.4 | 30 days supply | Qty: 30 | Fill #0

## 2019-04-28 ENCOUNTER — Other Ambulatory Visit: Payer: Self-pay

## 2019-04-28 DIAGNOSIS — K5909 Other constipation: Secondary | ICD-10-CM

## 2019-04-28 MED ORDER — LINACLOTIDE 72 MCG PO CAPS: 72.0000 ug | ORAL_CAPSULE | Freq: Every day | ORAL | 0 refills | Status: DC

## 2019-04-28 MED FILL — LINZESS 72 MCG CAPSULE: 72 | 30 days supply | Qty: 30 | Fill #0

## 2019-06-18 ENCOUNTER — Other Ambulatory Visit: Payer: Self-pay

## 2019-06-18 ENCOUNTER — Other Ambulatory Visit: Payer: Self-pay | Admitting: Internal Medicine

## 2019-06-18 MED ORDER — CYCLOBENZAPRINE HCL 10 MG PO TABS: 10.0000 mg | ORAL_TABLET | ORAL | 0 refills | Status: DC | PRN

## 2019-06-18 MED FILL — TAMSULOSIN HCL 0.4 MG CAP: 0.4 | 30 days supply | Qty: 30 | Fill #1

## 2019-06-19 ENCOUNTER — Other Ambulatory Visit: Payer: Self-pay

## 2019-06-19 MED ORDER — FLUTICASONE-SALMETEROL 250-50 MCG/DOSE IN AEPB: 1.0000 | INHALATION_SPRAY | Freq: Two times a day (BID) | RESPIRATORY_TRACT | 3 refills | Status: DC

## 2019-06-19 MED FILL — ADVAIR 250/50 DISKUS: 250-50 | 30 days supply | Qty: 60 | Fill #0

## 2019-06-19 MED FILL — CYCLOBENZAPRINE HCL 10 MG T: 10 | 30 days supply | Qty: 30 | Fill #0

## 2019-06-23 MED FILL — LOSARTAN POTASSIUM 50 MG TA: 50 | 90 days supply | Qty: 90 | Fill #1

## 2019-06-25 ENCOUNTER — Other Ambulatory Visit: Payer: Self-pay

## 2019-06-25 ENCOUNTER — Other Ambulatory Visit: Payer: 59

## 2019-06-25 ENCOUNTER — Ambulatory Visit: Payer: 59 | Admitting: Internal Medicine

## 2019-06-25 DIAGNOSIS — D582 Other hemoglobinopathies: Secondary | ICD-10-CM | POA: Diagnosis not present

## 2019-06-26 LAB — CBC WITH DIFFERENTIAL/PLATELET
Basophils Absolute: 0.1 10*3/uL (ref 0.0–0.2)
Basos: 1 %
EOS (ABSOLUTE): 0.3 10*3/uL (ref 0.0–0.4)
Eos: 4 %
Hematocrit: 48.5 % (ref 37.5–51.0)
Hemoglobin: 16.8 g/dL (ref 13.0–17.7)
Immature Grans (Abs): 0 10*3/uL (ref 0.0–0.1)
Immature Granulocytes: 0 %
Lymphocytes Absolute: 3.7 10*3/uL — ABNORMAL HIGH (ref 0.7–3.1)
Lymphs: 48 %
MCH: 29.5 pg (ref 26.6–33.0)
MCHC: 34.6 g/dL (ref 31.5–35.7)
MCV: 85 fL (ref 79–97)
Monocytes Absolute: 0.5 10*3/uL (ref 0.1–0.9)
Monocytes: 6 %
Neutrophils Absolute: 3.2 10*3/uL (ref 1.4–7.0)
Neutrophils: 41 %
Platelets: 242 10*3/uL (ref 150–450)
RBC: 5.69 x10E6/uL (ref 4.14–5.80)
RDW: 14.1 % (ref 11.6–15.4)
WBC: 7.8 10*3/uL (ref 3.4–10.8)

## 2019-07-23 MED FILL — ADVAIR 250/50 DISKUS: 250-50 | 30 days supply | Qty: 60 | Fill #1

## 2019-07-23 MED FILL — TAMSULOSIN HCL 0.4 MG CAP: 0.4 | 30 days supply | Qty: 30 | Fill #2

## 2019-07-23 MED FILL — LINZESS 72 MCG CAPSULE: 72 | 30 days supply | Qty: 30 | Fill #1

## 2019-08-04 ENCOUNTER — Other Ambulatory Visit: Payer: Self-pay | Admitting: Internal Medicine

## 2019-08-06 ENCOUNTER — Other Ambulatory Visit: Payer: Self-pay | Admitting: Internal Medicine

## 2019-08-06 ENCOUNTER — Other Ambulatory Visit: Payer: Self-pay

## 2019-08-06 ENCOUNTER — Telehealth: Payer: Self-pay

## 2019-08-06 MED ORDER — PREGABALIN 50 MG PO CAPS: 50.0000 mg | ORAL_CAPSULE | Freq: Two times a day (BID) | ORAL | 2 refills | Status: DC

## 2019-08-06 MED ORDER — PREGABALIN 50 MG PO CAPS: 50.0000 mg | ORAL_CAPSULE | Freq: Two times a day (BID) | ORAL | 3 refills | Status: DC

## 2019-08-06 NOTE — Telephone Encounter (Signed)
Printed prescription  

## 2019-08-07 NOTE — Telephone Encounter (Signed)
LYRICA REFILL- THEY SAID THEY DIDN'T RECEIVE THE ONE FROM YESTERDAY

## 2019-09-23 ENCOUNTER — Encounter: Payer: Self-pay | Admitting: Internal Medicine

## 2019-09-23 ENCOUNTER — Ambulatory Visit: Payer: 59 | Admitting: Internal Medicine

## 2019-09-23 ENCOUNTER — Other Ambulatory Visit: Payer: Self-pay

## 2019-09-23 VITALS — BP 120/86 | HR 97 | Temp 98.0°F | Ht 70.0 in | Wt 185.0 lb

## 2019-09-23 DIAGNOSIS — M25522 Pain in left elbow: Secondary | ICD-10-CM

## 2019-09-23 DIAGNOSIS — I129 Hypertensive chronic kidney disease with stage 1 through stage 4 chronic kidney disease, or unspecified chronic kidney disease: Secondary | ICD-10-CM

## 2019-09-23 DIAGNOSIS — M50121 Cervical disc disorder at C4-C5 level with radiculopathy: Secondary | ICD-10-CM

## 2019-09-23 DIAGNOSIS — Z79899 Other long term (current) drug therapy: Secondary | ICD-10-CM

## 2019-09-23 DIAGNOSIS — E7800 Pure hypercholesterolemia, unspecified: Secondary | ICD-10-CM

## 2019-09-23 DIAGNOSIS — N182 Chronic kidney disease, stage 2 (mild): Secondary | ICD-10-CM | POA: Diagnosis not present

## 2019-09-23 DIAGNOSIS — F4321 Adjustment disorder with depressed mood: Secondary | ICD-10-CM | POA: Diagnosis not present

## 2019-09-23 MED ORDER — KETOROLAC TROMETHAMINE 30 MG/ML IJ SOLN
30.0000 mg | Freq: Once | INTRAMUSCULAR | Status: AC
  Administered 2019-09-23: 30 mg via INTRAMUSCULAR

## 2019-09-23 MED ORDER — BUPROPION HCL ER (XL) 150 MG PO TB24: 150.0000 mg | ORAL_TABLET | Freq: Every day | ORAL | 1 refills | Status: DC

## 2019-09-23 MED ORDER — TRIAMCINOLONE ACETONIDE 40 MG/ML IJ SUSP
60.0000 mg | Freq: Once | INTRAMUSCULAR | Status: AC
  Administered 2019-09-23: 60 mg via INTRAMUSCULAR

## 2019-09-23 MED FILL — buPROPion HCL ER (XL) 150 M: 150 | 90 days supply | Qty: 90 | Fill #0

## 2019-09-23 NOTE — Progress Notes (Signed)
Rutherford Nail as a scribe for [name], MD.,have documented all relevant documentation on the behalf of [name], MD,as directed by  [name], MD while in the presence of [name], MD.  This visit occurred during the SARS-CoV-2 public health emergency.  Safety protocols were in place, including screening questions prior to the visit, additional usage of staff PPE, and extensive cleaning of exam room while observing appropriate contact time as indicated for disinfecting solutions.  Subjective:     [name] , male    DOB: 02/19/1951 , 69 y.o.   MRN: [id number]   Chief Complaint  Patient presents with  . Hyperlipidemia  . Elbow Pain    left  . Medication Refill    wellbutrin    HPI  Patient presents for cholesterol check. He is not currently taking any meds for high cholesterol. He is not exercising on a regular basis. Additionally, he c/o left elbow pain. Denies fall/trauma.  Not sure what may have triggered his sx.   Additionally, he wants to resume Wellbutrin XL. He thinks this will help him to deal with his sister's illness.  He plans to visit her in [place] in the near future. Unfortunately, she has recurrent breast cancer. She may be terminally ill.     Past Medical History:  Diagnosis Date  . Arthritis   . Bullous emphysema (HCC)    Severe Bilateral  . Cocaine abuse ([place])   . Colitis    with bleeding  . COPD (chronic obstructive pulmonary disease) ([place])   . Dyspnea   . GERD (gastroesophageal reflux disease)   . Headache    sinusitis  . Hypertension   . LBP (low back pain)    now chronic  . Lumbago   . Lung mass    Right Upper Lobe pleural based mass, negative  on PET Scan  . Neck pain    nerve pain   . Pneumonia    hx  . Spine pain    nerve  . Therapeutic drug monitoring   . Tobacco abuse      Family History  Problem Relation Age of Onset  . Cancer Father   . Cervical cancer Sister   .  Emphysema Mother      Current Outpatient Medications:  .  albuterol (PROVENTIL HFA;VENTOLIN HFA) 108 (90 BASE) MCG/ACT inhaler, Inhale 2 puffs into the lungs every 6 (six) hours as needed for wheezing or shortness of breath. Shortness of breath, Disp: , Rfl:  .  cyclobenzaprine (FLEXERIL) 10 MG tablet, Take 1 tablet (10 mg total) by mouth as needed for muscle spasms., Disp: 30 tablet, Rfl: 0 .  Fluticasone-Salmeterol (ADVAIR DISKUS) 250-50 MCG/DOSE AEPB, Inhale 1 puff into the lungs 2 (two) times daily., Disp: 60 each, Rfl: 3 .  ipratropium-albuterol (DUONEB) 0.5-2.5 (3) MG/3ML SOLN, INHALE 1 VIAL (3MLS) USING NEBULIZER 4 TIMES PER DAY, Disp: 270 mL, Rfl: 11 .  linaclotide (LINZESS) 72 MCG capsule, Take 1 capsule (72 mcg total) by mouth daily before breakfast. (Patient taking differently: Take 72 mcg by mouth daily before breakfast. prn), Disp: 90 capsule, Rfl: 0 .  losartan (COZAAR) 50 MG tablet, TAKE 1 TABLET BY MOUTH DAILY, Disp: 90 tablet, Rfl: 2 .  montelukast (SINGULAIR) 10 MG tablet, every evening., Disp: , Rfl: 1 .  omeprazole (PRILOSEC) 40 MG capsule, TAKE 1 CAPSULE BY MOUTH EVERY DAY BEFORE A MEAL, Disp: 90 capsule, Rfl: 2 .  pregabalin (LYRICA) 50  MG capsule, TAKE 1 CAPSULE BY MOUTH TWICE DAILY, Disp: 60 capsule, Rfl: 3 .  Respiratory Therapy Supplies (FLUTTER) DEVI, Use as directed, Disp: 1 each, Rfl: 0 .  Tiotropium Bromide Monohydrate (SPIRIVA RESPIMAT) 2.5 MCG/ACT AERS, Inhale 2 puffs into the lungs daily., Disp: , Rfl:  .  atorvastatin (LIPITOR) 20 MG tablet, Take 1 tablet (20 mg total) by mouth daily., Disp: 30 tablet, Rfl: 1 .  buPROPion (WELLBUTRIN XL) 150 MG 24 hr tablet, Take 1 tablet (150 mg total) by mouth daily., Disp: 90 tablet, Rfl: 1 .  Magnesium 400 MG CAPS, Take 400 mg by mouth daily. (Patient not taking: Reported on 09/22/2019), Disp: 30 capsule, Rfl: 1 .  Plecanatide (TRULANCE) 3 MG TABS, Take by mouth. (Patient not taking: Reported on 09/22/2019), Disp: , Rfl:  .   Probiotic Product (RESTORA PO), Take by mouth. (Patient not taking: Reported on 09/22/2019), Disp: , Rfl:    Allergies  Allergen Reactions  . Penicillins Anaphylaxis and Hives    Has patient had a PCN reaction causing immediate rash, facial/tongue/throat swelling, SOB or lightheadedness with hypotension: No Has patient had a PCN reaction causing severe rash involving mucus membranes or skin necrosis: No Has patient had a PCN reaction that required hospitalization No Has patient had a PCN reaction occurring within the last 10 years: No If all of the above answers are "NO", then may proceed with Cephalosporin use.       Review of Systems  Constitutional: Negative.  Negative for fatigue.  HENT: Negative.   Eyes: Negative.   Respiratory: Negative.   Cardiovascular: Negative.   Gastrointestinal: Negative.   Musculoskeletal: Positive for arthralgias.  Skin: Negative.   Psychiatric/Behavioral: Negative.      Today's Vitals   09/23/19 1149  BP: (!) 120/86  Pulse: 97  Temp: 98 F (36.7 C)  TempSrc: Oral  Weight: 185 lb (83.9 kg)  Height: '5\' 10"'  (1.778 m)   Body mass index is 26.54 kg/m.   Objective:  Physical Exam Vitals and nursing note reviewed.  Constitutional:      Appearance: Normal appearance.  Cardiovascular:     Rate and Rhythm: Normal rate and regular rhythm.     Heart sounds: Normal heart sounds.  Pulmonary:     Effort: Pulmonary effort is normal.     Breath sounds: Normal breath sounds.  Musculoskeletal:     Comments: There is some lateral epicondylar tenderness to palpation.   Skin:    General: Skin is warm.  Neurological:     General: No focal deficit present.     Mental Status: He is alert.  Psychiatric:        Mood and Affect: Mood normal.         Assessment And Plan:     1. Pure hypercholesterolemia Comments: I will check non-fasting lipid panel. Encouraged to avoid fried foods and to incorporate more exercise/fiber into his daily routine.  -  Lipid panel  2. Cervical disc disorder at C4-C5 level with radiculopathy Comments: Chronic, I will schedule him for MRI cervical spine.He does not wish to see Dr. Christella Noa at this time.  He was also given ketorolac/kenalog injection x 1.  - MR Cervical Spine Wo Contrast; Future - ketorolac (TORADOL) 30 MG/ML injection 30 mg - triamcinolone acetonide (KENALOG-40) injection 60 mg  3. Left elbow pain Comments: I will check an arthritis panel. I will make other recommendations once his labs are available for review.  - ANA, IFA (with reflex) - CYCLIC CITRUL  PEPTIDE ANTIBODY, IGG/IGA - Rheumatoid factor - Sedimentation rate - Uric acid  4. Parenchymal renal hypertension, stage 1 through stage 4 or unspecified chronic kidney disease Comments: Chronic, well controlled. He will continue with curent meds. He is encouraged to avoid adding salt to his foods. - BMP8+EGFR  5. Chronic renal disease, stage II Comments: Chronic, this has been stable. I will encourage him to stay well hydrated.   6. Drug therapy - Vitamin B12  7. Adjustment disorder with depressed mood Comments: I will resume Wellbutrin xl as requested. If needed, will consider hospice referral for grief counseling.      Patient was given opportunity to ask questions. Patient verbalized understanding of the plan and was able to repeat key elements of the plan. All questions were answered to their satisfaction.  [name], MD   I, [name], MD, have reviewed all documentation for this visit. The documentation on 09/28/19 for the exam, diagnosis, procedures, and orders are all accurate and complete.  THE PATIENT IS ENCOURAGED TO PRACTICE SOCIAL DISTANCING DUE TO THE COVID-19 PANDEMIC.

## 2019-09-23 NOTE — Patient Instructions (Signed)
Cervical Radiculopathy  Cervical radiculopathy happens when a nerve in the neck (a cervical nerve) is pinched or bruised. This condition can happen because of an injury to the cervical spine (vertebrae) in the neck, or as part of the normal aging process. Pressure on the cervical nerves can cause pain or numbness that travels from the neck all the way down into the arm and fingers. Usually, this condition gets better with rest. Treatment may be needed if the condition does not improve. What are the causes? This condition may be caused by:  A neck injury.  A bulging (herniated) disk.  Muscle spasms.  Muscle tightness in the neck because of overuse.  Arthritis.  Breakdown or degeneration in the bones and joints of the spine (spondylosis) due to aging.  Bone spurs that may develop near the cervical nerves. What are the signs or symptoms? Symptoms of this condition include:  Pain. The pain may travel from the neck to the arm and hand. The pain can be severe or irritating. It may be worse when you move your neck.  Numbness or tingling in your arm or hand.  Weakness in the affected arm and hand, in severe cases. How is this diagnosed? This condition may be diagnosed based on your symptoms, your medical history, and a physical exam. You may also have tests, including:  X-rays.  A CT scan.  An MRI.  An electromyogram (EMG).  Nerve conduction tests. How is this treated? In many cases, treatment is not needed for this condition. With rest, the condition usually gets better over time. If treatment is needed, options may include:  Wearing a soft neck collar (cervical collar) for short periods of time, as told by your health care provider.  Doing physical therapy to strengthen your neck muscles.  Taking medicines, such as NSAIDs or oral corticosteroids.  Having spinal injections, in severe cases.  Having surgery. This may be needed if other treatments do not help. Different types  of surgery may be done depending on the cause of this condition. Follow these instructions at home: If you have a cervical collar:  Wear it as told by your health care provider. Remove it only as told by your health care provider.  Ask your health care provider if you can remove the collar for cleaning and bathing. If you are allowed to remove the collar for cleaning or bathing: ? Follow instructions from your health care provider about how to remove the collar safely. ? Clean the collar by wiping it with mild soap and water and drying it completely. ? Take out any removable pads in the collar every 1-2 days, and wash them by hand with soap and water. Let them air-dry completely before you put them back in the collar. ? Check your skin under the collar for irritation or sores. If you see any, tell your health care provider. Managing pain      Take over-the-counter and prescription medicines only as told by your health care provider.  If directed, put ice on the affected area. ? If you have a soft neck collar, remove it as told by your health care provider. ? Put ice in a plastic bag. ? Place a towel between your skin and the bag. ? Leave the ice on for 20 minutes, 2-3 times a day.  If applying ice does not help, you can try using heat. Use the heat source that your health care provider recommends, such as a moist heat pack or a heating pad. ?  Place a towel between your skin and the heat source. °? Leave the heat on for 20-30 minutes. °? Remove the heat if your skin turns bright red. This is especially important if you are unable to feel pain, heat, or cold. You may have a greater risk of getting burned. °· Try a gentle neck and shoulder massage to help relieve symptoms. °Activity °· Rest as needed. °· Return to your normal activities as told by your health care provider. Ask your health care provider what activities are safe for you. °· Do stretching and strengthening exercises as told by  your health care provider or physical therapist. °· Do not lift anything that is heavier than 10 lb (4.5 kg) until your health care provider tells you that it is safe. °General instructions °· Use a flat pillow when you sleep. °· Do not drive while wearing a cervical collar. If you do not have a cervical collar, ask your health care provider if it is safe to drive while your neck heals. °· Ask your health care provider if the medicine prescribed to you requires you to avoid driving or using heavy machinery. °· Do not use any products that contain nicotine or tobacco, such as cigarettes, e-cigarettes, and chewing tobacco. These can delay healing. If you need help quitting, ask your health care provider. °· Keep all follow-up visits as told by your health care provider. This is important. °Contact a health care provider if: °· Your condition does not improve with treatment. °Get help right away if: °· Your pain gets much worse and cannot be controlled with medicines. °· You have weakness or numbness in your hand, arm, face, or leg. °· You have a high fever. °· You have a stiff, rigid neck. °· You lose control of your bowels or your bladder (have incontinence). °· You have trouble with walking, balance, or speaking. °Summary °· Cervical radiculopathy happens when a nerve in the neck is pinched or bruised. °· A nerve can get pinched from a bulging disk, arthritis, muscle spasms, or an injury to the neck. °· Symptoms include pain, tingling, or numbness radiating from the neck into the arm or hand. Weakness can also occur in severe cases. °· Treatment may include rest, wearing a cervical collar, and physical therapy. Medicines may be prescribed to help with pain. In severe cases, injections or surgery may be needed. °This information is not intended to replace advice given to you by your health care provider. Make sure you discuss any questions you have with your health care provider. °Document Revised: 01/04/2018  Document Reviewed: 01/04/2018 °Elsevier Patient Education © 2020 [employer] ° °

## 2019-09-24 LAB — BMP8+EGFR
BUN/Creatinine Ratio: 10 (ref 10–24)
BUN: 12 mg/dL (ref 8–27)
CO2: 23 mmol/L (ref 20–29)
Calcium: 10 mg/dL (ref 8.6–10.2)
Chloride: 100 mmol/L (ref 96–106)
Creatinine, Ser: 1.23 mg/dL (ref 0.76–1.27)
GFR calc Af Amer: 70 mL/min/{1.73_m2} (ref >59)
GFR calc non Af Amer: 60 mL/min/{1.73_m2} (ref >59)
Glucose: 96 mg/dL (ref 65–99)
Potassium: 4.6 mmol/L (ref 3.5–5.2)
Sodium: 137 mmol/L (ref 134–144)

## 2019-09-24 LAB — CYCLIC CITRUL PEPTIDE ANTIBODY, IGG/IGA: Cyclic Citrullin Peptide Ab: 6 units (ref 0–19)

## 2019-09-24 LAB — LIPID PANEL
Chol/HDL Ratio: 4.1 ratio (ref 0.0–5.0)
Cholesterol, Total: 212 mg/dL — ABNORMAL HIGH (ref 100–199)
HDL: 52 mg/dL (ref >39)
LDL Chol Calc (NIH): 145 mg/dL — ABNORMAL HIGH (ref 0–99)
Triglycerides: 83 mg/dL (ref 0–149)
VLDL Cholesterol Cal: 15 mg/dL (ref 5–40)

## 2019-09-24 LAB — URIC ACID: Uric Acid: 6.9 mg/dL (ref 3.8–8.4)

## 2019-09-24 LAB — VITAMIN B12: Vitamin B-12: 466 pg/mL (ref 232–1245)

## 2019-09-24 LAB — ANTINUCLEAR ANTIBODIES, IFA: ANA Titer 1: NEGATIVE

## 2019-09-24 LAB — SEDIMENTATION RATE: Sed Rate: 24 mm/hr (ref 0–30)

## 2019-09-24 LAB — RHEUMATOID FACTOR: Rheumatoid fact SerPl-aCnc: 10.8 IU/mL (ref 0.0–13.9)

## 2019-09-25 ENCOUNTER — Telehealth: Payer: Self-pay

## 2019-09-25 ENCOUNTER — Other Ambulatory Visit: Payer: Self-pay

## 2019-09-25 MED ORDER — ATORVASTATIN CALCIUM 20 MG PO TABS
20.0000 mg | ORAL_TABLET | Freq: Every day | ORAL | 1 refills | Status: DC
Start: 2019-09-25 — End: 2020-01-15

## 2019-09-25 MED FILL — ATORVASTATIN 20 MG TABLET: 20 | 30 days supply | Qty: 30 | Fill #0

## 2019-09-25 NOTE — Telephone Encounter (Signed)
-----   Message from Glendale Chard, MD sent at 09/24/2019  7:24 PM EDT ----- Here are your lab results: Your LDL, bad cholesterol is 145. Ideally, this should be less than 100.    I would like to start you on cholesterol medication. Are you willing to take this? It is also important to avoid fried foods, increase exercise and decrease your meat/cheese intake.   Please let me know if you have any questions or concerns. Stay safe!   Sincerely,    Robyn N. [facility]aird [facility], MD

## 2019-09-25 NOTE — Telephone Encounter (Signed)
lvm for pt to return call for lab results  

## 2019-09-29 MED FILL — TAMSULOSIN HCL 0.4 MG CAP: 0.4 | 30 days supply | Qty: 30 | Fill #3

## 2019-09-29 MED FILL — ADVAIR 250/50 DISKUS: 250-50 | 30 days supply | Qty: 60 | Fill #2

## 2019-10-20 MED FILL — LOSARTAN POTASSIUM 50 MG TA: 50 | 90 days supply | Qty: 90 | Fill #2

## 2019-11-05 ENCOUNTER — Other Ambulatory Visit: Payer: Self-pay | Admitting: Internal Medicine

## 2019-11-05 MED FILL — TAMSULOSIN HCL 0.4 MG CAP: 0.4 | 30 days supply | Qty: 30 | Fill #4

## 2019-11-05 MED FILL — OMEPRAZOLE 40 MG CPDR: 40 | 90 days supply | Qty: 90 | Fill #0

## 2019-11-05 MED FILL — CYCLOBENZAPRINE HCL 10 MG T: 10 | 30 days supply | Qty: 30 | Fill #0

## 2019-11-05 MED FILL — PREGABALIN 50 MG CAPS: 50 | 30 days supply | Qty: 60 | Fill #1

## 2019-11-11 ENCOUNTER — Ambulatory Visit: Payer: 59 | Admitting: Internal Medicine

## 2019-12-02 ENCOUNTER — Ambulatory Visit (INDEPENDENT_AMBULATORY_CARE_PROVIDER_SITE_OTHER): Payer: 59 | Admitting: Internal Medicine

## 2019-12-02 ENCOUNTER — Other Ambulatory Visit: Payer: Self-pay

## 2019-12-02 ENCOUNTER — Encounter: Payer: Self-pay | Admitting: Internal Medicine

## 2019-12-02 VITALS — BP 122/74 | HR 96 | Temp 97.6°F | Ht 70.0 in | Wt 192.2 lb

## 2019-12-02 DIAGNOSIS — J4489 Other specified chronic obstructive pulmonary disease: Secondary | ICD-10-CM | POA: Insufficient documentation

## 2019-12-02 DIAGNOSIS — I2583 Coronary atherosclerosis due to lipid rich plaque: Secondary | ICD-10-CM | POA: Diagnosis not present

## 2019-12-02 DIAGNOSIS — F4321 Adjustment disorder with depressed mood: Secondary | ICD-10-CM | POA: Diagnosis not present

## 2019-12-02 DIAGNOSIS — I10 Essential (primary) hypertension: Secondary | ICD-10-CM | POA: Diagnosis not present

## 2019-12-02 DIAGNOSIS — J449 Chronic obstructive pulmonary disease, unspecified: Secondary | ICD-10-CM | POA: Diagnosis not present

## 2019-12-02 DIAGNOSIS — J411 Mucopurulent chronic bronchitis: Secondary | ICD-10-CM | POA: Diagnosis not present

## 2019-12-02 DIAGNOSIS — I2510 Atherosclerotic heart disease of native coronary artery without angina pectoris: Secondary | ICD-10-CM | POA: Diagnosis not present

## 2019-12-02 DIAGNOSIS — F17200 Nicotine dependence, unspecified, uncomplicated: Secondary | ICD-10-CM | POA: Diagnosis not present

## 2019-12-02 DIAGNOSIS — E7800 Pure hypercholesterolemia, unspecified: Secondary | ICD-10-CM | POA: Diagnosis not present

## 2019-12-02 DIAGNOSIS — I700 Atherosclerosis of aorta: Secondary | ICD-10-CM | POA: Diagnosis not present

## 2019-12-02 NOTE — Progress Notes (Signed)
I,[name],acting as a Education administrator for Peter Greenland, MD.,have documented all relevant documentation on the behalf of Peter Greenland, MD,as directed by  Peter Greenland, MD while in the presence of Peter Greenland, MD.  This visit occurred during the SARS-CoV-2 public health emergency.  Safety protocols were in place, including screening questions prior to the visit, additional usage of staff PPE, and extensive cleaning of exam room while observing appropriate contact time as indicated for disinfecting solutions.  Subjective:     [name] , male    DOB: 10/02/1950 , 69 y.o.   MRN: [id number]   Chief Complaint  Patient presents with   Wellbutrin f/u   Hyperlipidemia    HPI  He is here today for f/u adjustment disorder. He presented at last visit c/o depressed mood due to sister's recent diagnosis of cancer. He was started on Wellbutrin. He has not had any issues with the medication.   Additionally, he is here today for cholesterol check. He was started on atorvastatin at his last visit. He has tolerated the medication without any issues.     Past Medical History:  Diagnosis Date   Arthritis    Bullous emphysema (HCC)    Severe Bilateral   Cocaine abuse ([place])    Colitis    with bleeding   COPD (chronic obstructive pulmonary disease) (HCC)    Dyspnea    GERD (gastroesophageal reflux disease)    Headache    sinusitis   Hypertension    LBP (low back pain)    now chronic   Lumbago    Lung mass    Right Upper Lobe pleural based mass, negative  on PET Scan   Neck pain    nerve pain    Pneumonia    hx   Spine pain    nerve   Therapeutic drug monitoring    Tobacco abuse      Family History  Problem Relation Age of Onset   Cancer Father    Cervical cancer Sister    Emphysema Mother      Current Outpatient Medications:    atorvastatin (LIPITOR) 20 MG tablet, Take 1 tablet (20 mg total) by mouth daily., Disp: 30  tablet, Rfl: 1   buPROPion (WELLBUTRIN XL) 150 MG 24 hr tablet, Take 1 tablet (150 mg total) by mouth daily., Disp: 90 tablet, Rfl: 1   cyclobenzaprine (FLEXERIL) 10 MG tablet, TAKE 1 TABLET (10 MG TOTAL) BY MOUTH AS NEEDED FOR MUSCLE SPASMS., Disp: 30 tablet, Rfl: 0   Fluticasone-Salmeterol (ADVAIR DISKUS) 250-50 MCG/DOSE AEPB, Inhale 1 puff into the lungs 2 (two) times daily., Disp: 60 each, Rfl: 3   ipratropium-albuterol (DUONEB) 0.5-2.5 (3) MG/3ML SOLN, INHALE 1 VIAL (3MLS) USING NEBULIZER 4 TIMES PER DAY, Disp: 270 mL, Rfl: 11   linaclotide (LINZESS) 72 MCG capsule, Take 1 capsule (72 mcg total) by mouth daily before breakfast. (Patient taking differently: Take 72 mcg by mouth daily before breakfast. prn), Disp: 90 capsule, Rfl: 0   losartan (COZAAR) 50 MG tablet, TAKE 1 TABLET BY MOUTH DAILY, Disp: 90 tablet, Rfl: 2   Magnesium 400 MG CAPS, Take 400 mg by mouth daily., Disp: 30 capsule, Rfl: 1   montelukast (SINGULAIR) 10 MG tablet, every evening., Disp: , Rfl: 1   omeprazole (PRILOSEC) 40 MG capsule, TAKE 1 CAPSULE BY MOUTH EVERY DAY BEFORE A MEAL, Disp: 90 capsule, Rfl: 2   pregabalin (LYRICA) 50 MG capsule, TAKE 1 CAPSULE BY MOUTH TWICE  DAILY, Disp: 60 capsule, Rfl: 3   Respiratory Therapy Supplies (FLUTTER) DEVI, Use as directed, Disp: 1 each, Rfl: 0   albuterol (PROVENTIL HFA;VENTOLIN HFA) 108 (90 BASE) MCG/ACT inhaler, Inhale 2 puffs into the lungs every 6 (six) hours as needed for wheezing or shortness of breath. Shortness of breath (Patient not taking: Reported on 12/02/2019), Disp: , Rfl:    Tiotropium Bromide Monohydrate (SPIRIVA RESPIMAT) 2.5 MCG/ACT AERS, Inhale 2 puffs into the lungs daily. (Patient not taking: Reported on 12/02/2019), Disp: , Rfl:    Allergies  Allergen Reactions   Penicillins Anaphylaxis and Hives    Has patient had a PCN reaction causing immediate rash, facial/tongue/throat swelling, SOB or lightheadedness with hypotension: No Has patient had a  PCN reaction causing severe rash involving mucus membranes or skin necrosis: No Has patient had a PCN reaction that required hospitalization No Has patient had a PCN reaction occurring within the last 10 years: No If all of the above answers are "NO", then may proceed with Cephalosporin use.       Review of Systems  Constitutional: Negative.   Respiratory: Negative.   Cardiovascular: Negative.   Psychiatric/Behavioral: Negative.   All other systems reviewed and are negative.    Today's Vitals   12/02/19 1105  BP: 122/74  Pulse: 96  Temp: 97.6 F (36.4 C)  TempSrc: Oral  SpO2: 98%  Weight: 192 lb 3.2 oz (87.2 kg)  Height: '5\' 10"'  (1.778 m)  PainSc: 0-No pain   Body mass index is 27.58 kg/m.  Wt Readings from Last 3 Encounters:  12/02/19 192 lb 3.2 oz (87.2 kg)  09/23/19 185 lb (83.9 kg)  03/24/19 190 lb 9.6 oz (86.5 kg)   Objective:  Physical Exam Vitals and nursing note reviewed.  Constitutional:      Appearance: Normal appearance.  HENT:     Head: Normocephalic and atraumatic.  Cardiovascular:     Rate and Rhythm: Normal rate and regular rhythm.     Heart sounds: Normal heart sounds.  Pulmonary:     Effort: Pulmonary effort is normal.     Breath sounds: Rhonchi present.  Skin:    General: Skin is warm.  Neurological:     General: No focal deficit present.     Mental Status: He is alert and oriented to person, place, and time.         Assessment And Plan:     1. Adjustment disorder with depressed mood Comments: Stable. He will continue with current meds.   2. Pure hypercholesterolemia Comments: Chronic, I will check fasting lipid panel today.  He will continue with atorvastatin 48m daily for now.  - Lipid panel - ALT  3. Coronary atherosclerosis due to lipid rich plaque Comments: Chronic, he agrees to Cardiology evaluation. He does report some SOB, but he also has COPD.  - Ambulatory referral to Cardiology  4. Aortic atherosclerosis  (HCC) Comments: Chronic. Encouraged to follow heart healthy lifestyle. Importance of smoking cessation was discussed with the patient.  He will continue with atorvastatin.  - Ambulatory referral to Cardiology  5. Essential hypertension, benign  Chronic, well controlled. She will continue with current meds. She is encouraged to avoid adding salt to her foods.   - BMP8+EGFR  6. COPD with chronic bronchitis (HCC) Comments: Chronic. He was given samples of Breztri to use 2 puffs twice daily. He is encouraged to notify me in two weeks how he feels on this new inhaler. He will rto in Jan 2022 for his  next physical examination.   7. Mucopurulent chronic bronchitis (HCC) Comments: Chronic. He is also followed by Pulmonary at [facility].   8. Tobacco use disorder Comments: He is not yet ready to quit smoking. I will check low dose CT chest for lung CA screening. He is in agreement with plan. Previous year's CT reviewed in detail.  - CT CHEST LUNG CA SCREEN LOW DOSE W/O CM; Future     Patient was given opportunity to ask questions. Patient verbalized understanding of the plan and was able to repeat key elements of the plan. All questions were answered to their satisfaction.  Peter Greenland, MD   I, Peter Greenland, MD, have reviewed all documentation for this visit. The documentation on 12/02/19 for the exam, diagnosis, procedures, and orders are all accurate and complete.  THE PATIENT IS ENCOURAGED TO PRACTICE SOCIAL DISTANCING DUE TO THE COVID-19 PANDEMIC.

## 2019-12-02 NOTE — Patient Instructions (Signed)
Tobacco Use Disorder Tobacco use disorder (TUD) occurs when a person craves, seeks, and uses tobacco, regardless of the consequences. This disorder can cause problems with mental and physical health. It can affect your ability to have healthy relationships, and it can keep you from meeting your responsibilities at work, home, or school. Tobacco may be:  Smoked as a cigarette or cigar.  Inhaled using e-cigarettes.  Smoked in a pipe or hookah.  Chewed as smokeless tobacco.  Inhaled into the nostrils as snuff. Tobacco products contain a dangerous chemical called nicotine, which is very addictive. Nicotine triggers hormones that make the body feel stimulated and works on areas of the brain that make you feel good. These effects can make it hard for people to quit nicotine. Tobacco contains many other unsafe chemicals that can damage almost every organ in the body. Smoking tobacco also puts others in danger due to fire risk and possible health problems caused by breathing in secondhand smoke. What are the signs or symptoms? Symptoms of TUD may include:  Being unable to slow down or stop your tobacco use.  Spending an abnormal amount of time getting or using tobacco.  Craving tobacco. Cravings may last for up to 6 months after quitting.  Tobacco use that: ? Interferes with your work, school, or home life. ? Interferes with your personal and social relationships. ? Makes you give up activities that you once enjoyed or found important.  Using tobacco even though you know that it is: ? Dangerous or bad for your health or someone else's health. ? Causing problems in your life.  Needing more and more of the substance to get the same effect (developing tolerance).  Experiencing unpleasant symptoms if you do not use the substance (withdrawal). Withdrawal symptoms may include: ? Depressed, anxious, or irritable mood. ? Difficulty concentrating. ? Increased appetite. ? Restlessness or trouble  sleeping.  Using the substance to avoid withdrawal. How is this diagnosed? This condition may be diagnosed based on:  Your current and past tobacco use. Your health care provider may ask questions about how your tobacco use affects your life.  A physical exam. You may be diagnosed with TUD if you have at least two symptoms within a 12-month period. How is this treated? This condition is treated by stopping tobacco use. Many people are unable to quit on their own and need help. Treatment may include:  Nicotine replacement therapy (NRT). NRT provides nicotine without the other harmful chemicals in tobacco. NRT gradually lowers the dosage of nicotine in the body and reduces withdrawal symptoms. NRT is available as: ? Over-the-counter gums, lozenges, and skin patches. ? Prescription mouth inhalers and nasal sprays.  Medicine that acts on the brain to reduce cravings and withdrawal symptoms.  A type of talk therapy that examines your triggers for tobacco use, how to avoid them, and how to cope with cravings (behavioral therapy).  Hypnosis. This may help with withdrawal symptoms.  Joining a support group for others coping with TUD. The best treatment for TUD is usually a combination of medicine, talk therapy, and support groups. Recovery can be a long process. Many people start using tobacco again after stopping (relapse). If you relapse, it does not mean that treatment will not work. Follow these instructions at home:  Lifestyle  Do not use any products that contain nicotine or tobacco, such as cigarettes and e-cigarettes.  Avoid things that trigger tobacco use as much as you can. Triggers include people and situations that usually cause you   to use tobacco.  Avoid drinks that contain caffeine, including coffee. These may worsen some withdrawal symptoms.  Find ways to manage stress. Wanting to smoke may cause stress, and stress can make you want to smoke. Relaxation techniques such as  deep breathing, meditation, and yoga may help.  Attend support groups as needed. These groups are an important part of long-term recovery for many people. General instructions  Take over-the-counter and prescription medicines only as told by your health care provider.  Check with your health care provider before taking any new prescription or over-the-counter medicines.  Decide on a friend, family member, or smoking quit-line (such as 1-800-QUIT-NOW in the [place] that you can call or text when you feel the urge to smoke or when you need help coping with cravings.  Keep all follow-up visits as told by your health care provider and therapist. This is important. Contact a health care provider if:  You are not able to take your medicines as prescribed.  Your symptoms get worse, even with treatment. Summary  Tobacco use disorder (TUD) occurs when a person craves, seeks, and uses tobacco regardless of the consequences.  This condition may be diagnosed based on your current and past tobacco use and a physical exam.  Many people are unable to quit on their own and need help. Recovery can be a long process.  The most effective treatment for TUD is usually a combination of medicine, talk therapy, and support groups. This information is not intended to replace advice given to you by your health care provider. Make sure you discuss any questions you have with your health care provider. Document Revised: 01/31/2017 Document Reviewed: 01/31/2017 Elsevier Patient Education  2020 [employer] Inc[employer]  

## 2019-12-03 LAB — LIPID PANEL
Chol/HDL Ratio: 4.3 ratio (ref 0.0–5.0)
Cholesterol, Total: 227 mg/dL — ABNORMAL HIGH (ref 100–199)
HDL: 53 mg/dL (ref >39)
LDL Chol Calc (NIH): 153 mg/dL — ABNORMAL HIGH (ref 0–99)
Triglycerides: 118 mg/dL (ref 0–149)
VLDL Cholesterol Cal: 21 mg/dL (ref 5–40)

## 2019-12-03 LAB — ALT: ALT: 20 IU/L (ref 0–44)

## 2019-12-03 LAB — BMP8+EGFR
BUN/Creatinine Ratio: 15 (ref 10–24)
BUN: 17 mg/dL (ref 8–27)
CO2: 23 mmol/L (ref 20–29)
Calcium: 9.8 mg/dL (ref 8.6–10.2)
Chloride: 103 mmol/L (ref 96–106)
Creatinine, Ser: 1.17 mg/dL (ref 0.76–1.27)
GFR calc Af Amer: 74 mL/min/{1.73_m2} (ref >59)
GFR calc non Af Amer: 64 mL/min/{1.73_m2} (ref >59)
Glucose: 115 mg/dL — ABNORMAL HIGH (ref 65–99)
Potassium: 4.1 mmol/L (ref 3.5–5.2)
Sodium: 143 mmol/L (ref 134–144)

## 2019-12-08 ENCOUNTER — Other Ambulatory Visit: Payer: Self-pay | Admitting: Internal Medicine

## 2019-12-08 MED ORDER — PREDNISONE 10 MG (21) PO TBPK
ORAL_TABLET | ORAL | 0 refills | Status: DC
Start: 2019-12-08 — End: 2020-01-07

## 2019-12-08 MED FILL — TAMSULOSIN HCL 0.4 MG CAP: 0.4 | 30 days supply | Qty: 30 | Fill #5

## 2019-12-09 ENCOUNTER — Telehealth: Payer: Self-pay

## 2019-12-09 MED FILL — predniSONE 10 MG TABS: 10 | 6 days supply | Qty: 21 | Fill #0

## 2019-12-09 NOTE — Telephone Encounter (Signed)
Mrs Potenza was notified that the pt's prescription for prednisone was faxed to his pharmacy.  Mrs. Victorian wanted to mention that the pt has been having trouble hearing and needed to have his hearing checked.  A note was added to check his hearing at his next visit at Mrs. Emigh's request.

## 2019-12-16 ENCOUNTER — Ambulatory Visit (INDEPENDENT_AMBULATORY_CARE_PROVIDER_SITE_OTHER): Payer: 59

## 2019-12-16 ENCOUNTER — Other Ambulatory Visit: Payer: Self-pay

## 2019-12-16 VITALS — BP 136/84 | HR 102 | Temp 98.0°F | Ht 70.0 in | Wt 191.0 lb

## 2019-12-16 DIAGNOSIS — Z23 Encounter for immunization: Secondary | ICD-10-CM | POA: Diagnosis not present

## 2019-12-16 DIAGNOSIS — Z0110 Encounter for examination of ears and hearing without abnormal findings: Secondary | ICD-10-CM

## 2019-12-16 NOTE — Progress Notes (Signed)
Patient is here today for flu vaccine. He also needs his hearing checked.

## 2019-12-18 ENCOUNTER — Encounter (HOSPITAL_COMMUNITY): Payer: Self-pay | Admitting: Emergency Medicine

## 2019-12-18 ENCOUNTER — Emergency Department (HOSPITAL_COMMUNITY): Payer: 59

## 2019-12-18 ENCOUNTER — Other Ambulatory Visit: Payer: Self-pay

## 2019-12-18 ENCOUNTER — Emergency Department (HOSPITAL_COMMUNITY)
Admission: EM | Admit: 2019-12-18 | Discharge: 2019-12-18 | Disposition: A | Discharge status: Home / Self Care | Payer: 59 | Attending: Emergency Medicine | Admitting: Emergency Medicine

## 2019-12-18 DIAGNOSIS — J432 Centrilobular emphysema: Secondary | ICD-10-CM | POA: Diagnosis not present

## 2019-12-18 DIAGNOSIS — Z79899 Other long term (current) drug therapy: Secondary | ICD-10-CM | POA: Diagnosis not present

## 2019-12-18 DIAGNOSIS — I2510 Atherosclerotic heart disease of native coronary artery without angina pectoris: Secondary | ICD-10-CM | POA: Insufficient documentation

## 2019-12-18 DIAGNOSIS — R072 Precordial pain: Secondary | ICD-10-CM | POA: Insufficient documentation

## 2019-12-18 DIAGNOSIS — M542 Cervicalgia: Secondary | ICD-10-CM | POA: Diagnosis not present

## 2019-12-18 DIAGNOSIS — J449 Chronic obstructive pulmonary disease, unspecified: Secondary | ICD-10-CM | POA: Diagnosis not present

## 2019-12-18 DIAGNOSIS — R0789 Other chest pain: Secondary | ICD-10-CM | POA: Diagnosis present

## 2019-12-18 DIAGNOSIS — K5730 Diverticulosis of large intestine without perforation or abscess without bleeding: Secondary | ICD-10-CM | POA: Diagnosis not present

## 2019-12-18 DIAGNOSIS — N184 Chronic kidney disease, stage 4 (severe): Secondary | ICD-10-CM | POA: Insufficient documentation

## 2019-12-18 DIAGNOSIS — J322 Chronic ethmoidal sinusitis: Secondary | ICD-10-CM | POA: Diagnosis not present

## 2019-12-18 DIAGNOSIS — K449 Diaphragmatic hernia without obstruction or gangrene: Secondary | ICD-10-CM | POA: Diagnosis not present

## 2019-12-18 DIAGNOSIS — R55 Syncope and collapse: Secondary | ICD-10-CM | POA: Diagnosis not present

## 2019-12-18 DIAGNOSIS — I708 Atherosclerosis of other arteries: Secondary | ICD-10-CM | POA: Diagnosis not present

## 2019-12-18 DIAGNOSIS — I1310 Hypertensive heart and chronic kidney disease without heart failure, with stage 1 through stage 4 chronic kidney disease, or unspecified chronic kidney disease: Secondary | ICD-10-CM | POA: Diagnosis not present

## 2019-12-18 DIAGNOSIS — J439 Emphysema, unspecified: Secondary | ICD-10-CM | POA: Diagnosis not present

## 2019-12-18 DIAGNOSIS — R079 Chest pain, unspecified: Secondary | ICD-10-CM | POA: Diagnosis not present

## 2019-12-18 DIAGNOSIS — N3289 Other specified disorders of bladder: Secondary | ICD-10-CM | POA: Diagnosis not present

## 2019-12-18 DIAGNOSIS — F17210 Nicotine dependence, cigarettes, uncomplicated: Secondary | ICD-10-CM | POA: Insufficient documentation

## 2019-12-18 DIAGNOSIS — J3489 Other specified disorders of nose and nasal sinuses: Secondary | ICD-10-CM | POA: Diagnosis not present

## 2019-12-18 LAB — BASIC METABOLIC PANEL
Anion gap: 10 (ref 5–15)
BUN: 16 mg/dL (ref 8–23)
CO2: 27 mmol/L (ref 22–32)
Calcium: 9.9 mg/dL (ref 8.9–10.3)
Chloride: 102 mmol/L (ref 98–111)
Creatinine, Ser: 1.14 mg/dL (ref 0.61–1.24)
GFR, Estimated: >60 mL/min (ref >60)
Glucose, Bld: 114 mg/dL — ABNORMAL HIGH (ref 70–99)
Potassium: 4.5 mmol/L (ref 3.5–5.1)
Sodium: 139 mmol/L (ref 135–145)

## 2019-12-18 LAB — URINALYSIS, ROUTINE W REFLEX MICROSCOPIC
Bilirubin Urine: NEGATIVE
Glucose, UA: NEGATIVE mg/dL
Hgb urine dipstick: NEGATIVE
Ketones, ur: NEGATIVE mg/dL
Leukocytes,Ua: NEGATIVE
Nitrite: NEGATIVE
Protein, ur: NEGATIVE mg/dL
Specific Gravity, Urine: >1.046 — ABNORMAL HIGH (ref 1.005–1.030)
pH: 6 (ref 5.0–8.0)

## 2019-12-18 LAB — CBC
HCT: 55.7 % — ABNORMAL HIGH (ref 39.0–52.0)
Hemoglobin: 18.1 g/dL — ABNORMAL HIGH (ref 13.0–17.0)
MCH: 29.2 pg (ref 26.0–34.0)
MCHC: 32.5 g/dL (ref 30.0–36.0)
MCV: 90 fL (ref 80.0–100.0)
Platelets: 286 10*3/uL (ref 150–400)
RBC: 6.19 MIL/uL — ABNORMAL HIGH (ref 4.22–5.81)
RDW: 16.5 % — ABNORMAL HIGH (ref 11.5–15.5)
WBC: 9.5 10*3/uL (ref 4.0–10.5)
nRBC: 0 % (ref 0.0–0.2)

## 2019-12-18 LAB — TROPONIN I (HIGH SENSITIVITY)
Troponin I (High Sensitivity): 8 ng/L (ref <18)
Troponin I (High Sensitivity): 22 ng/L — ABNORMAL HIGH (ref <18)
Troponin I (High Sensitivity): 7 ng/L (ref <18)

## 2019-12-18 MED ORDER — SODIUM CHLORIDE 0.9 % IV BOLUS
1000.0000 mL | Freq: Once | INTRAVENOUS | Status: AC
  Administered 2019-12-18: 1000 mL via INTRAVENOUS

## 2019-12-18 MED ORDER — IOHEXOL 350 MG/ML SOLN
80.0000 mL | Freq: Once | INTRAVENOUS | Status: AC | PRN
  Administered 2019-12-18: 80 mL via INTRAVENOUS

## 2019-12-18 NOTE — ED Triage Notes (Signed)
Pt c/o left side cp radiating to his back, neck and arm, with SOB and nausea.

## 2019-12-18 NOTE — ED Provider Notes (Signed)
[facility] EMERGENCY DEPARTMENT Provider Note   CSN: [id number] Arrival date & time: 12/18/19  1045     History Chief Complaint  Patient presents with  . Chest Pain    Peter Sims is a 69 y.o. male.  Pt presents to the ED today with cp and back and neck pain.  Pt said he was on the commode last night having a bowel movement.  He felt lightheaded, became sweaty, and felt like he could not speak.  He called EMS who did orthostatics.  He was told his bp "bottomed out."  He felt better, so did not go to the ED.  He called his pcp this am who told him to come to the ED for further eval.  Pt has some chest, neck, and back pain now, but it is mild.  He says he feels pretty normal now.          Past Medical History:  Diagnosis Date  . Arthritis   . Bullous emphysema (HCC)    Severe Bilateral  . Cocaine abuse ([place])   . Colitis    with bleeding  . COPD (chronic obstructive pulmonary disease) ([place])   . Dyspnea   . GERD (gastroesophageal reflux disease)   . Headache    sinusitis  . Hypertension   . LBP (low back pain)    now chronic  . Lumbago   . Lung mass    Right Upper Lobe pleural based mass, negative  on PET Scan  . Neck pain    nerve pain   . Pneumonia    hx  . Spine pain    nerve  . Therapeutic drug monitoring   . Tobacco abuse     Patient Active Problem List   Diagnosis Date Noted  . Aortic atherosclerosis ([place]) 12/02/2019  . Coronary atherosclerosis due to lipid rich plaque 12/02/2019  . COPD with chronic bronchitis ([place]) 12/02/2019  . Mucopurulent chronic bronchitis ([place]) 12/02/2019  . Constipation 03/05/2018  . Hyperlipidemia 03/05/2018  . Hypertensive chronic kidney disease with stage 1 through stage 4 chronic kidney disease, or unspecified chronic kidney disease 03/05/2018  . Tobacco abuse counseling 03/05/2018  . Cervical disc disorder at C4-C5 level with radiculopathy 06/18/2017  . Chronic cough 02/02/2017  . Essential  hypertension, benign 11/21/2016  . HNP (herniated nucleus pulposus), lumbar 05/03/2016  . Bronchopneumonia 09/28/2011  . COPD with exacerbation ([place]) 09/28/2011  . Chest pain 09/28/2011  . Lumbago   . Cigarette smoker   . COPD GOLD 0/ ab with pseudowheeze component  03/07/2011  . Cocaine abuse ([place])   . GERD (gastroesophageal reflux disease)   . Bullous emphysema ([place])   . Lung mass     Past Surgical History:  Procedure Laterality Date  . COLONOSCOPY    . LUMBAR LAMINECTOMY/DECOMPRESSION MICRODISCECTOMY Right 05/03/2016   Procedure: MICRODISCECTOMY LUMBAR FIVE- SACRAL ONE RIGHT;  Surgeon: Ashok Pall, MD;  Location: [facility];  Service: Neurosurgery;  Laterality: Right;  . NO PAST SURGERIES         Family History  Problem Relation Age of Onset  . Cancer Father   . Cervical cancer Sister   . Emphysema Mother     Social History   Tobacco Use  . Smoking status: Current Every Day Smoker    Packs/day: 0.50    Years: 45.00    Pack years: 22.50    Types: Cigarettes  . Smokeless tobacco: Never Used  . Tobacco comment: 2ppd x15 years, then 1/2ppd,  now 1ppd x 3  Vaping Use  . Vaping Use: Never used  Substance Use Topics  . Alcohol use: Yes    Comment: scotch once a week  . Drug use: Yes    Types: Cocaine, Marijuana    Comment: crack cocaine quit 2003    Home Medications Prior to Admission medications   Medication Sig Start Date End Date Taking? Authorizing Provider  atorvastatin (LIPITOR) 20 MG tablet Take 1 tablet (20 mg total) by mouth daily. 09/25/19  Yes Glendale Chard, MD  Budeson-Glycopyrrol-Formoterol (BREZTRI AEROSPHERE) 160-9-4.8 MCG/ACT AERO Inhale 1 puff into the lungs daily.   Yes [provider]  buPROPion (WELLBUTRIN XL) 150 MG 24 hr tablet Take 1 tablet (150 mg total) by mouth daily. 09/23/19  Yes Glendale Chard, MD  cyclobenzaprine (FLEXERIL) 10 MG tablet TAKE 1 TABLET (10 MG TOTAL) BY MOUTH AS NEEDED FOR MUSCLE SPASMS. 11/05/19  Yes Glendale Chard, MD    ipratropium-albuterol (DUONEB) 0.5-2.5 (3) MG/3ML SOLN INHALE 1 VIAL (3MLS) USING NEBULIZER 4 TIMES PER DAY Patient taking differently: Inhale 3 mLs into the lungs every 6 (six) hours as needed (shortness of breath).  07/11/18  Yes Glendale Chard, MD  linaclotide [facility]) 72 MCG capsule Take 1 capsule (72 mcg total) by mouth daily before breakfast. Patient taking differently: Take 72 mcg by mouth daily before breakfast. prn 04/28/19  Yes Glendale Chard, MD  losartan (COZAAR) 50 MG tablet TAKE 1 TABLET BY MOUTH DAILY Patient taking differently: Take 50 mg by mouth daily.  03/03/19  Yes Glendale Chard, MD  Magnesium 400 MG CAPS Take 400 mg by mouth daily. 06/05/18  Yes Glendale Chard, MD  montelukast (SINGULAIR) 10 MG tablet Take 10 mg by mouth at bedtime.  09/26/16  Yes [provider]  omeprazole (PRILOSEC) 40 MG capsule TAKE 1 CAPSULE BY MOUTH EVERY DAY BEFORE A MEAL Patient taking differently: Take 40 mg by mouth daily.  11/05/19  Yes Glendale Chard, MD  pregabalin (LYRICA) 50 MG capsule TAKE 1 CAPSULE BY MOUTH TWICE DAILY Patient taking differently: Take 50 mg by mouth 2 (two) times daily.  08/07/19  Yes Glendale Chard, MD  tamsulosin (FLOMAX) 0.4 MG CAPS capsule Take 0.4 mg by mouth daily. 12/08/19  Yes [provider]  albuterol (PROVENTIL HFA;VENTOLIN HFA) 108 (90 BASE) MCG/ACT inhaler Inhale 2 puffs into the lungs every 6 (six) hours as needed for wheezing or shortness of breath. Shortness of breath Patient not taking: Reported on 12/02/2019    [provider]  Fluticasone-Salmeterol (ADVAIR DISKUS) 250-50 MCG/DOSE AEPB Inhale 1 puff into the lungs 2 (two) times daily. Patient not taking: Reported on 12/18/2019 06/19/19   Glendale Chard, MD  predniSONE (STERAPRED UNI-PAK 21 TAB) 10 MG (21) TBPK tablet Use as directed; dispense as 6-day dose pack 12/08/19   Glendale Chard, MD  Respiratory Therapy Supplies (FLUTTER) DEVI Use as directed 02/02/17   Tanda Rockers, MD     Allergies    Penicillins  Review of Systems   Review of Systems  Cardiovascular: Positive for chest pain.  Musculoskeletal: Positive for back pain and neck pain.  Neurological: Positive for light-headedness.  All other systems reviewed and are negative.   Physical Exam Updated Vital Signs BP (!) 144/97   Pulse 84   Temp 98.2 F (36.8 C) (Oral)   Resp (!) 23   Ht 5\' 10"  (1.778 m)   Wt 86.6 kg   SpO2 98%   BMI 27.39 kg/m   Physical Exam Vitals and nursing  note reviewed.  Constitutional:      Appearance: He is well-developed.  HENT:     Head: Normocephalic and atraumatic.  Eyes:     Extraocular Movements: Extraocular movements intact.     Pupils: Pupils are equal, round, and reactive to light.  Cardiovascular:     Rate and Rhythm: Normal rate and regular rhythm.     Heart sounds: Normal heart sounds.  Pulmonary:     Effort: Pulmonary effort is normal.     Breath sounds: Normal breath sounds.  Abdominal:     General: Bowel sounds are normal.     Palpations: Abdomen is soft.  Musculoskeletal:        General: Normal range of motion.     Cervical back: Normal range of motion and neck supple.  Skin:    General: Skin is warm and dry.     Capillary Refill: Capillary refill takes less than 2 seconds.  Neurological:     General: No focal deficit present.     Mental Status: He is alert and oriented to person, place, and time.  Psychiatric:        Mood and Affect: Mood normal.        Behavior: Behavior normal.     ED Results / Procedures / Treatments   Labs (all labs ordered are listed, but only abnormal results are displayed) Labs Reviewed  BASIC METABOLIC PANEL - Abnormal; Notable for the following components:      Result Value   Glucose, Bld 114 (*)    All other components within normal limits  CBC - Abnormal; Notable for the following components:   RBC 6.19 (*)    Hemoglobin 18.1 (*)    HCT 55.7 (*)    RDW 16.5 (*)    All other components within  normal limits  URINALYSIS, ROUTINE W REFLEX MICROSCOPIC - Abnormal; Notable for the following components:   Color, Urine STRAW (*)    Specific Gravity, Urine >1.046 (*)    All other components within normal limits  TROPONIN I (HIGH SENSITIVITY) - Abnormal; Notable for the following components:   Troponin I (High Sensitivity) 22 (*)    All other components within normal limits  TROPONIN I (HIGH SENSITIVITY)  TROPONIN I (HIGH SENSITIVITY)    EKG EKG Interpretation  Date/Time:  Thursday December 18 2019 11:19:23 EDT Ventricular Rate:  112 PR Interval:  116 QRS Duration: 80 QT Interval:  316 QTC Calculation: 431 R Axis:   83 Text Interpretation: Sinus tachycardia Nonspecific ST abnormality Abnormal ECG Since last tracing rate faster Confirmed by Isla Pence [phone]) on 12/18/2019 11:59:49 AM   Radiology No results found.  Procedures Procedures (including critical care time)  Medications Ordered in ED Medications  sodium chloride 0.9 % bolus 1,000 mL (0 mLs Intravenous Stopped 12/18/19 1330)  iohexol (OMNIPAQUE) 350 MG/ML injection 80 mL (80 mLs Intravenous Contrast Given 12/18/19 1521)    ED Course  I have reviewed the triage vital signs and the nursing notes.  Pertinent labs & imaging results that were available during my care of the patient were reviewed by me and considered in my medical decision making (see chart for details).    MDM Rules/Calculators/A&P                          SX are consistent with vasovagal syncope.  No evidence of dissection.  2nd troponin is elevated a little, so I will repeat 1 more.  I am  not finding anything concerning on evaluation.  Pt to f/u with pcp.  Return if worse.  Final Clinical Impression(s) / ED Diagnoses Final diagnoses:  Precordial pain    Rx / DC Orders ED Discharge Orders    None       Isla Pence, MD 12/21/19 1527

## 2019-12-18 NOTE — ED Provider Notes (Signed)
4:46 PM Care assumed from Dr. Gilford Raid.  At time of transfer of care, patient is waiting for results of a third troponin to make sure it is not rising precipitously as well as urinalysis given the bladder wall thickening on CT imaging earlier.  If those 2 tests are reassuring, anticipate discharge home.  If there is concerning findings, anticipate admission for chest pain with syncope and rising troponins.  Urinalysis did not show convincing evidence of UTI with no nitrites or leukocytes.  Troponin was normal on the third value.  Patient was very much wanting to be discharged so I printed his discharge paper.  He will be given instructions to follow-up with PCP and cardiology.    Patient discharged in good condition.  Clinical Impression: 1. Precordial pain     Disposition: Discharge  Condition: Good  I have discussed the results, Dx and Tx plan with the pt(& family if present). He/she/they expressed understanding and agree(s) with the plan. Discharge instructions discussed at great length. Strict return precautions discussed and pt &/or family have verbalized understanding of the instructions. No further questions at time of discharge.    Discharge Medication List as of 12/18/2019  6:15 PM      Follow Up: Glendale Chard, [address] [place] [id number] [phone]     [place] [address] [phone]       Misbah Hornaday, Gwenyth Allegra, MD 12/18/19 [phone]

## 2019-12-18 NOTE — ED Notes (Signed)
Patient verbalizes understanding of discharge instructions. Opportunity for questioning and answers were provided. Pt discharged from ED ambulatory.  

## 2019-12-18 NOTE — Discharge Instructions (Signed)
Your work-up today did not show an acute cardiac cause of your discomfort today.  Your third heart enzyme was normal and reassuring.  The urinalysis also did not show convincing evidence of UTI.  Please follow-up with your primary doctor and due to the chest discomfort and your age, please follow-up with cardiology as well.  If any symptoms change or worsen, please return to the nearest emergency department.

## 2019-12-19 ENCOUNTER — Other Ambulatory Visit (HOSPITAL_COMMUNITY): Payer: Self-pay

## 2019-12-19 MED FILL — OMRON 3 SERIES BP MONITOR D: 30 days supply | Qty: 1 | Fill #0

## 2019-12-26 ENCOUNTER — Other Ambulatory Visit: Payer: Self-pay | Admitting: Internal Medicine

## 2019-12-26 MED FILL — CYCLOBENZAPRINE HCL 10 MG T: 10 | 30 days supply | Qty: 30 | Fill #0

## 2020-01-01 ENCOUNTER — Ambulatory Visit
Admission: RE | Admit: 2020-01-01 | Discharge: 2020-01-01 | Disposition: A | Discharge status: Home / Self Care | Payer: 59 | Source: Ambulatory Visit | Attending: Internal Medicine | Admitting: Internal Medicine

## 2020-01-01 DIAGNOSIS — M50121 Cervical disc disorder at C4-C5 level with radiculopathy: Secondary | ICD-10-CM

## 2020-01-02 MED FILL — PREGABALIN 50 MG CAPS: 50 | 30 days supply | Qty: 60 | Fill #2

## 2020-01-07 ENCOUNTER — Other Ambulatory Visit: Payer: Self-pay

## 2020-01-07 ENCOUNTER — Ambulatory Visit: Payer: 59 | Admitting: Cardiovascular Disease

## 2020-01-07 ENCOUNTER — Encounter: Payer: Self-pay | Admitting: Cardiovascular Disease

## 2020-01-07 VITALS — BP 125/72 | HR 92 | Temp 97.7°F | Ht 72.0 in | Wt 192.4 lb

## 2020-01-07 DIAGNOSIS — I2583 Coronary atherosclerosis due to lipid rich plaque: Secondary | ICD-10-CM

## 2020-01-07 DIAGNOSIS — E785 Hyperlipidemia, unspecified: Secondary | ICD-10-CM

## 2020-01-07 DIAGNOSIS — I10 Essential (primary) hypertension: Secondary | ICD-10-CM | POA: Diagnosis not present

## 2020-01-07 DIAGNOSIS — Z5181 Encounter for therapeutic drug level monitoring: Secondary | ICD-10-CM | POA: Diagnosis not present

## 2020-01-07 DIAGNOSIS — I700 Atherosclerosis of aorta: Secondary | ICD-10-CM

## 2020-01-07 DIAGNOSIS — I2510 Atherosclerotic heart disease of native coronary artery without angina pectoris: Secondary | ICD-10-CM

## 2020-01-07 DIAGNOSIS — R072 Precordial pain: Secondary | ICD-10-CM | POA: Diagnosis not present

## 2020-01-07 DIAGNOSIS — Z01812 Encounter for preprocedural laboratory examination: Secondary | ICD-10-CM | POA: Diagnosis not present

## 2020-01-07 MED ORDER — METOPROLOL TARTRATE 100 MG PO TABS: ORAL_TABLET | ORAL | 0 refills | Status: DC

## 2020-01-07 MED FILL — METOPROLOL TARTRATE 100 MG: 100 | 1 days supply | Qty: 1 | Fill #0

## 2020-01-07 NOTE — Progress Notes (Signed)
Cardiology Office Note   Date:  01/07/2020   ID:  Peter, Sims 07/19/1950, MRN [id number]  PCP:  Peter Chard, MD  Cardiologist:   Peter Latch, MD   No chief complaint on file.    History of Present Illness: Peter Sims is a 69 y.o. male with hypertension, hyperlipidemia, tobacco abuse, bullous emphysema, COPD, and GERD who is being seen today for the evaluation of chest pain at the request of Peter Chard, MD.  He was seen in the ED 11/2019 with precordial chest pain.  He also had pain in his back and neck at the time.  He had an episode of getting lightheaded and sweaty after having a bowel movement.  He felt lethargic and sweaty.  He also had slurred speech.  He called EMS and did orthostatic vitals that reportedly showed hypotension.  His blood pressure improved and they decided that he did not need to go to the hospital.  He called his PCP the following day and they recommended that he go to the emergency department for evaluation.  In the ED, high-sensitivity troponin revealed a pattern of 8-->22-->7.  EKG revealed sinus tachycardia to 112 bpm.  There were no acute ischemic changes.  Chest CT-A revealed pulmonary nodule, foci of atherosclerosis in the coronaries and aorta.  He was felt to be stable to be discharged and followed up with cardiology as an outpatient.  [name] recalls another similar episode that occurred when standing.  He was outside talking with a neighbor.  He then got a sharp pain in his chest.  He started walking into the kitchen and started seating profusely.  He was unable to speak to them.  He knew what he wanted to say but couldn't get the words out.  He nearly passed out.  The pain he describes was debilitating, sharp pain in the shoulder that felt like a spasm.  He has experienced pain like this other times when he turns too quickly.    [name] doesn't get much exercise.  He occasionally walks the dogs 2-4 blocks.  He gets  some shortness of breath and fatigue.  He gets a feeling that he is overdoing it and needs to slow down. He notes light pressure with with walking.  He has no LE edema, orthopnea or PND.  He is able to wash the cars or mow the lawn.  He was prescribed atorvastatin but only took 1 dose before he stopped taking it.  Today he started taking it was when he had a orthostatic episode and he thought it may have been related.  Instead he has been eating oatmeal.  He smokes 1/2 pack of cigarettes daily, which is down from 2 packs/day.  He tried using patches in the past but was unable to quit smoking so he felt poorly with them.  He notes some leg cramps at night but not with walking.  He denies any lower extremity edema, orthopnea, or PND.   Past Medical History:  Diagnosis Date  . Arthritis   . Bullous emphysema (HCC)    Severe Bilateral  . Cocaine abuse ([place])   . Colitis    with bleeding  . COPD (chronic obstructive pulmonary disease) ([place])   . Dyspnea   . GERD (gastroesophageal reflux disease)   . Headache    sinusitis  . Hypertension   . LBP (low back pain)    now chronic  . Lumbago   . Lung mass  Right Upper Lobe pleural based mass, negative  on PET Scan  . Neck pain    nerve pain   . Pneumonia    hx  . Spine pain    nerve  . Therapeutic drug monitoring   . Tobacco abuse     Past Surgical History:  Procedure Laterality Date  . COLONOSCOPY    . LUMBAR LAMINECTOMY/DECOMPRESSION MICRODISCECTOMY Right 05/03/2016   Procedure: MICRODISCECTOMY LUMBAR FIVE- SACRAL ONE RIGHT;  Surgeon: Ashok Pall, MD;  Location: [facility];  Service: Neurosurgery;  Laterality: Right;  . NO PAST SURGERIES       Current Outpatient Medications  Medication Sig Dispense Refill  . albuterol (PROVENTIL HFA;VENTOLIN HFA) 108 (90 BASE) MCG/ACT inhaler Inhale 2 puffs into the lungs every 6 (six) hours as needed for wheezing or shortness of breath. Shortness of breath    . atorvastatin (LIPITOR) 20 MG tablet Take 1  tablet (20 mg total) by mouth daily. 30 tablet 1  . Budeson-Glycopyrrol-Formoterol (BREZTRI AEROSPHERE) 160-9-4.8 MCG/ACT AERO Inhale 1 puff into the lungs daily.    Marland Kitchen buPROPion (WELLBUTRIN XL) 150 MG 24 hr tablet Take 1 tablet (150 mg total) by mouth daily. 90 tablet 1  . cyclobenzaprine (FLEXERIL) 10 MG tablet TAKE 1 TABLET (10 MG TOTAL) BY MOUTH AS NEEDED FOR MUSCLE SPASMS. 30 tablet 0  . Fluticasone-Salmeterol (ADVAIR DISKUS) 250-50 MCG/DOSE AEPB Inhale 1 puff into the lungs 2 (two) times daily. 60 each 3  . ipratropium-albuterol (DUONEB) 0.5-2.5 (3) MG/3ML SOLN INHALE 1 VIAL (3MLS) USING NEBULIZER 4 TIMES PER DAY (Patient taking differently: Inhale 3 mLs into the lungs every 6 (six) hours as needed (shortness of breath). ) 270 mL 11  . linaclotide (LINZESS) 72 MCG capsule Take 1 capsule (72 mcg total) by mouth daily before breakfast. (Patient taking differently: Take 72 mcg by mouth daily before breakfast. prn) 90 capsule 0  . losartan (COZAAR) 50 MG tablet TAKE 1 TABLET BY MOUTH DAILY (Patient taking differently: Take 50 mg by mouth daily. ) 90 tablet 2  . Magnesium 400 MG CAPS Take 400 mg by mouth daily. 30 capsule 1  . montelukast (SINGULAIR) 10 MG tablet Take 10 mg by mouth at bedtime.   1  . omeprazole (PRILOSEC) 40 MG capsule TAKE 1 CAPSULE BY MOUTH EVERY DAY BEFORE A MEAL (Patient taking differently: Take 40 mg by mouth daily. ) 90 capsule 2  . pregabalin (LYRICA) 50 MG capsule TAKE 1 CAPSULE BY MOUTH TWICE DAILY (Patient taking differently: Take 50 mg by mouth 2 (two) times daily. ) 60 capsule 3  . Respiratory Therapy Supplies (FLUTTER) DEVI Use as directed 1 each 0  . tamsulosin (FLOMAX) 0.4 MG CAPS capsule Take 0.4 mg by mouth daily.    . metoprolol tartrate (LOPRESSOR) 100 MG tablet TAKE 1 TABLET 2 HOURS PRIOR TO CT 1 tablet 0   No current facility-administered medications for this visit.    Allergies:   Penicillins    Social History:  The patient  reports that he has been  smoking cigarettes. He has a 22.50 pack-year smoking history. He has never used smokeless tobacco. He reports current alcohol use. He reports current drug use. Drugs: Cocaine and Marijuana.   Family History:  The patient's family history includes Breast cancer in his sister; Cancer in his father; Emphysema in his mother; Heart failure in his sister; Lung cancer in his father. He was adopted.    ROS:  Please see the history of present illness.   Otherwise, review  of systems are positive for none.   All other systems are reviewed and negative.    PHYSICAL EXAM: VS: BP 125/72  Temp 97.7 F (36.5 C)   Ht 6' (1.829 m)   Wt 192 lb 6.4 oz (87.3 kg)   SpO2 97%   BMI 26.09 kg/m  , BMI Body mass index is 26.09 kg/m. GENERAL:  Well appearing HEENT:  Pupils equal round and reactive, fundi not visualized, oral mucosa unremarkable NECK:  No jugular venous distention, waveform within normal limits, carotid upstroke brisk and symmetric, no bruits, no thyromegaly LYMPHATICS:  No cervical adenopathy LUNGS:  Clear to auscultation bilaterally HEART:  RRR.  PMI not displaced or sustained,S1 and S2 within normal limits, no S3, no S4, no clicks, no rubs, no murmurs ABD:  Flat, positive bowel sounds normal in frequency in pitch, no bruits, no rebound, no guarding, no midline pulsatile mass, no hepatomegaly, no splenomegaly EXT:  2 plus pulses throughout, no edema, no cyanosis no clubbing SKIN:  No rashes no nodules NEURO:  Cranial nerves II through XII grossly intact, motor grossly intact throughout PSYCH:  Cognitively intact, oriented to person place and time   EKG:  EKG is not ordered today.   Recent Labs: 12/02/2019: ALT 20 12/18/2019: BUN 16; Creatinine, Ser 1.14; Hemoglobin 18.1; Platelets 286; Potassium 4.5; Sodium 139    Lipid Panel    Component Value Date/Time   CHOL 227 (H) 12/02/2019 1219   TRIG 118 12/02/2019 1219   HDL 53 12/02/2019 1219   CHOLHDL 4.3 12/02/2019 1219   CHOLHDL 3.2  05/19/2010 0335   VLDL 9 05/19/2010 0335   LDLCALC 153 (H) 12/02/2019 1219      Wt Readings from Last 3 Encounters:  01/07/20 192 lb 6.4 oz (87.3 kg)  12/18/19 190 lb 14.7 oz (86.6 kg)  12/16/19 191 lb (86.6 kg)      ASSESSMENT AND PLAN:  # Atypical chest pain:  # coronary calcification: # Hyperlipidemia: Mr. Rex Kras has a couple different types of chest pain.  The sharp, left shoulder pain that he has sounds more musculoskeletal than ischemic.  I am more concerned by the fatigue and exertional dyspnea that he gets with exercise.  Chest CT was negative for PE but did show some coronary calcification.  We will get a coronary CT-a to assess for obstructive coronary disease.  I did recommend that he go ahead and start taking the atorvastatin that was recommended by Dr. Baird Cancer.  His LDL goal is less than 70 and I do not think it caused his symptoms.  Repeat fasting lipids and a CMP in 3 months.  # Near syncope:  Mr. Rottenberg's episode sound like classic vasovagal hypotension.  The first occurred in the setting of the sharp pain in the second occurred after straining to have a bowel movement.  Recommend that if he has symptoms like this again that he get down quickly and nighttime walk.  Recommend that he not get intravascularly volume depleted.  Ischemia evaluation as above.  # Tobacco abuse:  Recommend cessation.  # Hypertension:  BP stable on losartan.  Current medicines are reviewed at length with the patient today.  The patient does not have concerns regarding medicines.  The following changes have been made:  Start atorvastatin  Labs/ tests ordered today include:   Orders Placed This Encounter  Procedures  . CT CORONARY MORPH W/CTA COR W/SCORE W/CA W/CM &/OR WO/CM  . CT CORONARY FRACTIONAL FLOW RESERVE DATA PREP  . CT CORONARY  FRACTIONAL FLOW RESERVE FLUID ANALYSIS  . Basic metabolic panel  . Lipid panel  . Comprehensive metabolic panel     Disposition:   FU with  Evangeline Utley C. Oval Linsey, MD, [facility] in 3 months.    Signed, Korena Nass C. Oval Linsey, MD, [facility]  01/07/2020 5:58 PM    [place]

## 2020-01-07 NOTE — Patient Instructions (Addendum)
Medication Instructions:  TAKE METOPROLOL 100 MG 2 HOURS PRIOR TO YOUR CT  START THE ATORVASTATIN AS PRESCRIBED   *If you need a refill on your cardiac medications before your next appointment, please call your pharmacy*  Lab Work: BMET 1 WEEK PRIOR TO CT  FASTING LP/CMET 3 MONTHS 1 WEEK PRIOR TO FOLLOW UP   If you have labs (blood work) drawn today and your tests are completely normal, you will receive your results only by: Marland Kitchen MyChart Message (if you have MyChart) OR . A paper copy in the mail If you have any lab test that is abnormal or we need to change your treatment, we will call you to review the results.  Testing/Procedures: Your physician has requested that you have cardiac CT. Cardiac computed tomography (CT) is a painless test that uses an x-ray machine to take clear, detailed pictures of your heart. For further information please visit [email or web address]. Please follow instruction sheet as given. ONCE INSURANCE REVIEWED THE OFFICE WILL CALL YOU TO SCHEDULE   Follow-Up: At [facility], you and your health needs are our priority.  As part of our continuing mission to provide you with exceptional heart care, we have created designated Provider Care Teams.  These Care Teams include your primary Cardiologist (physician) and Advanced Practice Providers (APPs -  Physician Assistants and Nurse Practitioners) who all work together to provide you with the care you need, when you need it.  We recommend signing up for the patient portal called "MyChart".  Sign up information is provided on this After Visit Summary.  MyChart is used to connect with patients for Virtual Visits (Telemedicine).  Patients are able to view lab/test results, encounter notes, upcoming appointments, etc.  Non-urgent messages can be sent to your provider as well.   To learn more about what you can do with MyChart, go to [email or web address].    Your next appointment:   3 month(s)  The format for your next  appointment:   In Person  Provider:   You may see DR [facility]  or one of the following Advanced Practice Providers on your designated Care Team:    Kerin Ransom, PA-C  [place], [place]  Coletta Memos, [place]  Other Instructions Your cardiac CT will be scheduled at one of the below locations:   [facility] [address] [place], [place] [address] [phone]  If scheduled at [facility], please arrive at the [facility] main entrance of [facility] 30 minutes prior to test start time. Proceed to the [facility] Department (first floor) to check-in and test prep.  Please follow these instructions carefully (unless otherwise directed):  Hold all erectile dysfunction medications at least 3 days (72 hrs) prior to test.  On the Night Before the Test: . Be sure to Drink plenty of water. . Do not consume any caffeinated/decaffeinated beverages or chocolate 12 hours prior to your test. . Do not take any antihistamines 12 hours prior to your test. . If the patient has contrast allergy: ? Patient will need a prescription for Prednisone and very clear instructions (as follows): 1. Prednisone 50 mg - take 13 hours prior to test 2. Take another Prednisone 50 mg 7 hours prior to test 3. Take another Prednisone 50 mg 1 hour prior to test 4. Take Benadryl 50 mg 1 hour prior to test . Patient must complete all four doses of above prophylactic medications. . Patient will need a ride after test due to Benadryl.  On the Day of the Test: . Drink plenty of water. Do not drink any water within one hour of the test. . Do not eat any food 4 hours prior to the test. . You may take your regular medications prior to the test.  . Take metoprolol (Lopressor) two hours prior to test. . HOLD Furosemide/Hydrochlorothiazide morning of the test. . FEMALES- please wear underwire-free bra if available      After the Test: . Drink plenty of water. . After  receiving IV contrast, you may experience a mild flushed feeling. This is normal. . On occasion, you may experience a mild rash up to 24 hours after the test. This is not dangerous. If this occurs, you can take Benadryl 25 mg and increase your fluid intake. . If you experience trouble breathing, this can be serious. If it is severe call 911 IMMEDIATELY. If it is mild, please call our office. . If you take any of these medications: Glipizide/Metformin, Avandament, Glucavance, please do not take 48 hours after completing test unless otherwise instructed.   Once we have confirmed authorization from your insurance company, we will call you to set up a date and time for your test. Based on how quickly your insurance processes prior authorizations requests, please allow up to 4 weeks to be contacted for scheduling your Cardiac CT appointment. Be advised that routine Cardiac CT appointments could be scheduled as many as 8 weeks after your provider has ordered it.  For non-scheduling related questions, please contact the cardiac imaging nurse navigator should you have any questions/concerns: Marchia Bond, Cardiac Imaging Nurse Navigator Burley Saver, Interim Cardiac Imaging Nurse [place] [facility] Direct Office Dial: [phone]   For scheduling needs, including cancellations and rescheduling, please call Vivien Rota at [phone], option [phone].   Cardiac CT Angiogram A cardiac CT angiogram is a procedure to look at the heart and the area around the heart. It may be done to help find the cause of chest pains or other symptoms of heart disease. During this procedure, a substance called contrast dye is injected into the blood vessels in the area to be checked. A large X-ray machine, called a CT scanner, then takes detailed pictures of the heart and the surrounding area. The procedure is also sometimes called a coronary CT angiogram, coronary artery scanning, or CTA. A cardiac CT angiogram  allows the health care provider to see how well blood is flowing to and from the heart. The health care provider will be able to see if there are any problems, such as:  Blockage or narrowing of the coronary arteries in the heart.  Fluid around the heart.  Signs of weakness or disease in the muscles, valves, and tissues of the heart. Tell a health care provider about:  Any allergies you have. This is especially important if you have had a previous allergic reaction to contrast dye.  All medicines you are taking, including vitamins, herbs, eye drops, creams, and over-the-counter medicines.  Any blood disorders you have.  Any surgeries you have had.  Any medical conditions you have.  Whether you are pregnant or may be pregnant.  Any anxiety disorders, chronic pain, or other conditions you have that may increase your stress or prevent you from lying still. What are the risks? Generally, this is a safe procedure. However, problems may occur, including:  Bleeding.  Infection.  Allergic reactions to medicines or dyes.  Damage to other structures or organs.  Kidney damage from the  contrast dye that is used.  Increased risk of cancer from radiation exposure. This risk is low. Talk with your health care provider about: ? The risks and benefits of testing. ? How you can receive the lowest dose of radiation. What happens before the procedure?  Wear comfortable clothing and remove any jewelry, glasses, dentures, and hearing aids.  Follow instructions from your health care provider about eating and drinking. This may include: ? For 12 hours before the procedure -- avoid caffeine. This includes tea, coffee, soda, energy drinks, and diet pills. Drink plenty of water or other fluids that do not have caffeine in them. Being well hydrated can prevent complications. ? For 4-6 hours before the procedure -- stop eating and drinking. The contrast dye can cause nausea, but this is less likely if  your stomach is empty.  Ask your health care provider about changing or stopping your regular medicines. This is especially important if you are taking diabetes medicines, blood thinners, or medicines to treat problems with erections (erectile dysfunction). What happens during the procedure?   Hair on your chest may need to be removed so that small sticky patches called electrodes can be placed on your chest. These will transmit information that helps to monitor your heart during the procedure.  An IV will be inserted into one of your veins.  You might be given a medicine to control your heart rate during the procedure. This will help to ensure that good images are obtained.  You will be asked to lie on an exam table. This table will slide in and out of the CT machine during the procedure.  Contrast dye will be injected into the IV. You might feel warm, or you may get a metallic taste in your mouth.  You will be given a medicine called nitroglycerin. This will relax or dilate the arteries in your heart.  The table that you are lying on will move into the CT machine tunnel for the scan.  The person running the machine will give you instructions while the scans are being done. You may be asked to: ? Keep your arms above your head. ? Hold your breath. ? Stay very still, even if the table is moving.  When the scanning is complete, you will be moved out of the machine.  The IV will be removed. The procedure may vary among health care providers and hospitals. What can I expect after the procedure? After your procedure, it is common to have:  A metallic taste in your mouth from the contrast dye.  A feeling of warmth.  A headache from the nitroglycerin. Follow these instructions at home:  Take over-the-counter and prescription medicines only as told by your health care provider.  If you are told, drink enough fluid to keep your urine pale yellow. This will help to flush the contrast  dye out of your body.  Most people can return to their normal activities right after the procedure. Ask your health care provider what activities are safe for you.  It is up to you to get the results of your procedure. Ask your health care provider, or the department that is doing the procedure, when your results will be ready.  Keep all follow-up visits as told by your health care provider. This is important. Contact a health care provider if:  You have any symptoms of allergy to the contrast dye. These include: ? Shortness of breath. ? Rash or hives. ? A racing heartbeat. Summary  A  cardiac CT angiogram is a procedure to look at the heart and the area around the heart. It may be done to help find the cause of chest pains or other symptoms of heart disease.  During this procedure, a large X-ray machine, called a CT scanner, takes detailed pictures of the heart and the surrounding area after a contrast dye has been injected into blood vessels in the area.  Ask your health care provider about changing or stopping your regular medicines before the procedure. This is especially important if you are taking diabetes medicines, blood thinners, or medicines to treat erectile dysfunction.  If you are told, drink enough fluid to keep your urine pale yellow. This will help to flush the contrast dye out of your body. This information is not intended to replace advice given to you by your health care provider. Make sure you discuss any questions you have with your health care provider. Document Revised: 10/09/2018 Document Reviewed: 10/09/2018 Elsevier Patient Education  [place].

## 2020-01-13 ENCOUNTER — Emergency Department (HOSPITAL_COMMUNITY): Payer: 59

## 2020-01-13 ENCOUNTER — Inpatient Hospital Stay (HOSPITAL_COMMUNITY)
Admission: EM | Admit: 2020-01-13 | Discharge: 2020-01-15 | DRG: 247 | Disposition: A | Discharge status: Home / Self Care | Payer: 59 | Attending: Cardiovascular Disease | Admitting: Cardiovascular Disease

## 2020-01-13 DIAGNOSIS — I700 Atherosclerosis of aorta: Secondary | ICD-10-CM | POA: Diagnosis not present

## 2020-01-13 DIAGNOSIS — R748 Abnormal levels of other serum enzymes: Secondary | ICD-10-CM | POA: Diagnosis not present

## 2020-01-13 DIAGNOSIS — J439 Emphysema, unspecified: Secondary | ICD-10-CM | POA: Diagnosis not present

## 2020-01-13 DIAGNOSIS — Z720 Tobacco use: Secondary | ICD-10-CM | POA: Diagnosis not present

## 2020-01-13 DIAGNOSIS — R7303 Prediabetes: Secondary | ICD-10-CM | POA: Diagnosis present

## 2020-01-13 DIAGNOSIS — I213 ST elevation (STEMI) myocardial infarction of unspecified site: Secondary | ICD-10-CM | POA: Diagnosis not present

## 2020-01-13 DIAGNOSIS — I2109 ST elevation (STEMI) myocardial infarction involving other coronary artery of anterior wall: Principal | ICD-10-CM | POA: Diagnosis present

## 2020-01-13 DIAGNOSIS — R0789 Other chest pain: Secondary | ICD-10-CM | POA: Diagnosis not present

## 2020-01-13 DIAGNOSIS — E7800 Pure hypercholesterolemia, unspecified: Secondary | ICD-10-CM | POA: Diagnosis not present

## 2020-01-13 DIAGNOSIS — E785 Hyperlipidemia, unspecified: Secondary | ICD-10-CM | POA: Diagnosis present

## 2020-01-13 DIAGNOSIS — Z825 Family history of asthma and other chronic lower respiratory diseases: Secondary | ICD-10-CM | POA: Diagnosis not present

## 2020-01-13 DIAGNOSIS — I214 Non-ST elevation (NSTEMI) myocardial infarction: Secondary | ICD-10-CM | POA: Diagnosis not present

## 2020-01-13 DIAGNOSIS — Z8249 Family history of ischemic heart disease and other diseases of the circulatory system: Secondary | ICD-10-CM | POA: Diagnosis not present

## 2020-01-13 DIAGNOSIS — Z880 Allergy status to penicillin: Secondary | ICD-10-CM | POA: Diagnosis not present

## 2020-01-13 DIAGNOSIS — R079 Chest pain, unspecified: Secondary | ICD-10-CM | POA: Diagnosis not present

## 2020-01-13 DIAGNOSIS — K219 Gastro-esophageal reflux disease without esophagitis: Secondary | ICD-10-CM | POA: Diagnosis not present

## 2020-01-13 DIAGNOSIS — J449 Chronic obstructive pulmonary disease, unspecified: Secondary | ICD-10-CM | POA: Diagnosis present

## 2020-01-13 DIAGNOSIS — Z79899 Other long term (current) drug therapy: Secondary | ICD-10-CM | POA: Diagnosis not present

## 2020-01-13 DIAGNOSIS — Z20822 Contact with and (suspected) exposure to covid-19: Secondary | ICD-10-CM | POA: Diagnosis present

## 2020-01-13 DIAGNOSIS — F17200 Nicotine dependence, unspecified, uncomplicated: Secondary | ICD-10-CM | POA: Diagnosis present

## 2020-01-13 DIAGNOSIS — I2102 ST elevation (STEMI) myocardial infarction involving left anterior descending coronary artery: Secondary | ICD-10-CM | POA: Diagnosis not present

## 2020-01-13 DIAGNOSIS — J4489 Other specified chronic obstructive pulmonary disease: Secondary | ICD-10-CM | POA: Diagnosis present

## 2020-01-13 DIAGNOSIS — I10 Essential (primary) hypertension: Secondary | ICD-10-CM | POA: Diagnosis not present

## 2020-01-13 DIAGNOSIS — I2583 Coronary atherosclerosis due to lipid rich plaque: Secondary | ICD-10-CM | POA: Diagnosis not present

## 2020-01-13 DIAGNOSIS — I2510 Atherosclerotic heart disease of native coronary artery without angina pectoris: Secondary | ICD-10-CM | POA: Diagnosis present

## 2020-01-13 DIAGNOSIS — E119 Type 2 diabetes mellitus without complications: Secondary | ICD-10-CM | POA: Diagnosis not present

## 2020-01-13 DIAGNOSIS — Z716 Tobacco abuse counseling: Secondary | ICD-10-CM

## 2020-01-13 DIAGNOSIS — R7989 Other specified abnormal findings of blood chemistry: Secondary | ICD-10-CM

## 2020-01-13 DIAGNOSIS — R778 Other specified abnormalities of plasma proteins: Secondary | ICD-10-CM

## 2020-01-13 DIAGNOSIS — Z955 Presence of coronary angioplasty implant and graft: Secondary | ICD-10-CM

## 2020-01-13 DIAGNOSIS — Z95820 Peripheral vascular angioplasty status with implants and grafts: Secondary | ICD-10-CM

## 2020-01-13 LAB — BASIC METABOLIC PANEL
Anion gap: 8 (ref 5–15)
BUN: 11 mg/dL (ref 8–23)
CO2: 28 mmol/L (ref 22–32)
Calcium: 9.4 mg/dL (ref 8.9–10.3)
Chloride: 102 mmol/L (ref 98–111)
Creatinine, Ser: 1.13 mg/dL (ref 0.61–1.24)
GFR, Estimated: >60 mL/min (ref >60)
Glucose, Bld: 134 mg/dL — ABNORMAL HIGH (ref 70–99)
Potassium: 3.7 mmol/L (ref 3.5–5.1)
Sodium: 138 mmol/L (ref 135–145)

## 2020-01-13 LAB — CBG MONITORING, ED: Glucose-Capillary: 121 mg/dL — ABNORMAL HIGH (ref 70–99)

## 2020-01-13 LAB — TROPONIN I (HIGH SENSITIVITY)
Troponin I (High Sensitivity): 61 ng/L — ABNORMAL HIGH (ref <18)
Troponin I (High Sensitivity): 82 ng/L — ABNORMAL HIGH (ref <18)

## 2020-01-13 LAB — HEPATIC FUNCTION PANEL
ALT: 28 U/L (ref 0–44)
AST: 19 U/L (ref 15–41)
Albumin: 3.4 g/dL — ABNORMAL LOW (ref 3.5–5.0)
Alkaline Phosphatase: 86 U/L (ref 38–126)
Bilirubin, Direct: <0.1 mg/dL (ref 0.0–0.2)
Total Bilirubin: 0.6 mg/dL (ref 0.3–1.2)
Total Protein: 6.6 g/dL (ref 6.5–8.1)

## 2020-01-13 LAB — RAPID URINE DRUG SCREEN, HOSP PERFORMED
Amphetamines: NOT DETECTED
Barbiturates: NOT DETECTED
Benzodiazepines: NOT DETECTED
Cocaine: NOT DETECTED
Opiates: NOT DETECTED
Tetrahydrocannabinol: POSITIVE — AB

## 2020-01-13 LAB — CBC
HCT: 49.2 % (ref 39.0–52.0)
Hemoglobin: 16 g/dL (ref 13.0–17.0)
MCH: 29.7 pg (ref 26.0–34.0)
MCHC: 32.5 g/dL (ref 30.0–36.0)
MCV: 91.3 fL (ref 80.0–100.0)
Platelets: 238 10*3/uL (ref 150–400)
RBC: 5.39 MIL/uL (ref 4.22–5.81)
RDW: 15.7 % — ABNORMAL HIGH (ref 11.5–15.5)
WBC: 8.1 10*3/uL (ref 4.0–10.5)
nRBC: 0 % (ref 0.0–0.2)

## 2020-01-13 LAB — LIPASE, BLOOD: Lipase: 31 U/L (ref 11–51)

## 2020-01-13 MED ORDER — ASPIRIN 81 MG PO CHEW: 324.0000 mg | CHEWABLE_TABLET | Freq: Once | ORAL | Status: DC

## 2020-01-13 MED ORDER — NICOTINE 7 MG/24HR TD PT24
7.0000 mg | MEDICATED_PATCH | Freq: Once | TRANSDERMAL | Status: AC
  Administered 2020-01-13: 7 mg via TRANSDERMAL
  Filled 2020-01-13: qty 1

## 2020-01-13 MED ORDER — HEPARIN (PORCINE) 25000 UT/250ML-% IV SOLN
1050.0000 [IU]/h | INTRAVENOUS | Status: DC
  Administered 2020-01-13: 1050 [IU]/h via INTRAVENOUS
  Filled 2020-01-13: qty 250

## 2020-01-13 MED ORDER — HEPARIN BOLUS VIA INFUSION
4000.0000 [IU] | Freq: Once | INTRAVENOUS | Status: AC
  Administered 2020-01-13: 4000 [IU] via INTRAVENOUS
  Filled 2020-01-13: qty 4000

## 2020-01-13 MED ORDER — NITROGLYCERIN 0.4 MG SL SUBL
0.4000 mg | SUBLINGUAL_TABLET | SUBLINGUAL | Status: DC | PRN
  Administered 2020-01-13 – 2020-01-14 (×2): 0.4 mg via SUBLINGUAL
  Filled 2020-01-13 (×2): qty 1

## 2020-01-13 MED FILL — TAMSULOSIN HCL 0.4 MG CAP: 0.4 | 30 days supply | Qty: 30 | Fill #6

## 2020-01-13 NOTE — Progress Notes (Signed)
[facility] for Heparin Indication: chest pain/ACS  Allergies  Allergen Reactions  . Penicillins Anaphylaxis and Hives    Has patient had a PCN reaction causing immediate rash, facial/tongue/throat swelling, SOB or lightheadedness with hypotension: No Has patient had a PCN reaction causing severe rash involving mucus membranes or skin necrosis: No Has patient had a PCN reaction that required hospitalization No Has patient had a PCN reaction occurring within the last 10 years: No If all of the above answers are "NO", then may proceed with Cephalosporin use.      Patient Measurements: Height: 6' (182.9 cm) Weight: 87.2 kg (192 lb 3.9 oz) IBW/kg (Calculated) : 77.6 Heparin Dosing Weight: 87.2 kg  Vital Signs: Temp: 97.8 F (36.6 C) (11/16 1912) Temp Source: Oral (11/16 1912) BP: 122/83 (11/16 2130) Pulse Rate: 85 (11/16 2130)  Labs: Recent Labs    01/13/20 1932  HGB 16.0  HCT 49.2  PLT 238  CREATININE 1.13  TROPONINIHS 61*    Estimated Creatinine Clearance: 69.6 mL/min (by C-G formula based on SCr of 1.13 mg/dL).   Medical History: Past Medical History:  Diagnosis Date  . Arthritis   . Bullous emphysema (HCC)    Severe Bilateral  . Cocaine abuse ([place])   . Colitis    with bleeding  . COPD (chronic obstructive pulmonary disease) ([place])   . Dyspnea   . GERD (gastroesophageal reflux disease)   . Headache    sinusitis  . Hypertension   . LBP (low back pain)    now chronic  . Lumbago   . Lung mass    Right Upper Lobe pleural based mass, negative  on PET Scan  . Neck pain    nerve pain   . Pneumonia    hx  . Spine pain    nerve  . Therapeutic drug monitoring   . Tobacco abuse     Medications:  Scheduled:  . aspirin  324 mg Oral Once  . heparin  4,000 Units Intravenous Once  . nicotine  7 mg Transdermal Once    Assessment: Patient is a 68 yom that presents to the ED with c/o chest pain. The patient was found to  have an elevated trop and pharmacy has been asked to dose heparin at this time.   Goal of Therapy:  Heparin level 0.3-0.7 units/ml Monitor platelets by anticoagulation protocol: Yes   Plan:  - Heparin bolus 4000 units IV x 1 dose - Heparin drip @ 1050 units/hr - Heparin level in ~ 6 hours  - Monitor patient for s/s of bleeding and CBC while on heparin   Duanne Limerick PharmD. BCPS  01/13/2020,10:05 PM

## 2020-01-13 NOTE — ED Notes (Signed)
[facility] labs sent to lab.

## 2020-01-13 NOTE — ED Triage Notes (Signed)
Pt arrived via EMS from home after pt has had intermittent chest pain today. Pt has had the same on and off pressure pain for weeks. Pt received aspirin and nitro by EMS.

## 2020-01-13 NOTE — ED Provider Notes (Addendum)
[facility] EMERGENCY DEPARTMENT Provider Note   CSN: [id number] Arrival date & time: 01/13/20  1909     History Chief Complaint  Patient presents with  . Chest Pain    Peter Sims is a 69 y.o. male with history of hypertension, hyperlipidemia, tobacco use, emphysema, COPD, GERD presents to the ED by EMS for evaluation of chest pains.  Described as tightness like a blood pressure cuff around the left side of his chest.  Sometimes feels like a muscle spasming.  This has been going on for over 6 months.  Returned at 2 AM this morning, patient states he felt leg cramp starting so he sat up out of bed and felt the chest pain.  Returned again at 10 AM when he was walking out of his garage.  States for the last few months he has had recurrent chest pain like this.  He calls them "episodes".  He feels a tightness in his chest and then tightness around his neck.  Feels nauseous, metallic taste in his mouth, shortness of breath.  States one time his blood pressure dropped and he felt very lightheaded.  One time had sensation of urge to have a bowel movement as well.  Feels like he cannot move or act or talk during these episodes.  Today at 10 AM he felt the sudden onset of pain and tried to call 911 but felt like his hands were not moving.  This episode lasted about 30 minutes, improved after he laid down for a few hours.  Chest pain came back again around 5 PM followed with nausea and sweats.  Today he did have a headache which was new.  He received nitroglycerin in route by EMS and states he feels much better and is having now milder chest discomfort.  Feels like his chest pain episodes have been more frequent in the last 3 weeks.  Also reports longstanding fatigue and shortness of breath with exertion.  Specifically, states if he gets up quickly and walks across his house to pick up the phone he feels very tired and short of breath.  No associated vision changes, vomiting,  abdominal pain. Has had intermittent tingling of left finger tips but no numbness or one sided weakness. Denies illicit drug use specifically cocaine.   Seen in ER for CP last week had normal work up and CT dissection study. Seen by cardiology Dr Oval Linsey and awaiting coronary CT to be scheduled.   HPI  HPI: A 69 year old patient with a history of hypertension and hypercholesterolemia presents for evaluation of chest pain. Initial onset of pain was less than one hour ago. The patient's chest pain is described as heaviness/pressure/tightness, is not worse with exertion and is relieved by nitroglycerin. The patient complains of nausea and reports some diaphoresis. The patient's chest pain is middle- or left-sided, is not well-localized, is not sharp and does radiate to the arms/jaw/neck. The patient has smoked in the past 90 days. The patient has no history of stroke, has no history of peripheral artery disease, denies any history of treated diabetes, has no relevant family history of coronary artery disease (first degree relative at less than age 38) and does not have an elevated BMI (>=30).   Past Medical History:  Diagnosis Date  . Arthritis   . Bullous emphysema (HCC)    Severe Bilateral  . Cocaine abuse ([place])   . Colitis    with bleeding  . COPD (chronic obstructive pulmonary disease) ([place])   .  Dyspnea   . GERD (gastroesophageal reflux disease)   . Headache    sinusitis  . Hypertension   . LBP (low back pain)    now chronic  . Lumbago   . Lung mass    Right Upper Lobe pleural based mass, negative  on PET Scan  . Neck pain    nerve pain   . Pneumonia    hx  . Spine pain    nerve  . Therapeutic drug monitoring   . Tobacco abuse     Patient Active Problem List   Diagnosis Date Noted  . Aortic atherosclerosis ([place]) 12/02/2019  . Coronary atherosclerosis due to lipid rich plaque 12/02/2019  . COPD with chronic bronchitis ([place]) 12/02/2019  . Mucopurulent chronic bronchitis  ([place]) 12/02/2019  . Constipation 03/05/2018  . Hyperlipidemia 03/05/2018  . Hypertensive chronic kidney disease with stage 1 through stage 4 chronic kidney disease, or unspecified chronic kidney disease 03/05/2018  . Tobacco abuse counseling 03/05/2018  . Cervical disc disorder at C4-C5 level with radiculopathy 06/18/2017  . Chronic cough 02/02/2017  . Essential hypertension, benign 11/21/2016  . HNP (herniated nucleus pulposus), lumbar 05/03/2016  . Bronchopneumonia 09/28/2011  . COPD with exacerbation ([place]) 09/28/2011  . Chest pain 09/28/2011  . Lumbago   . Cigarette smoker   . COPD GOLD 0/ ab with pseudowheeze component  03/07/2011  . Cocaine abuse ([place])   . GERD (gastroesophageal reflux disease)   . Bullous emphysema ([place])   . Lung mass     Past Surgical History:  Procedure Laterality Date  . COLONOSCOPY    . LUMBAR LAMINECTOMY/DECOMPRESSION MICRODISCECTOMY Right 05/03/2016   Procedure: MICRODISCECTOMY LUMBAR FIVE- SACRAL ONE RIGHT;  Surgeon: Ashok Pall, MD;  Location: [facility];  Service: Neurosurgery;  Laterality: Right;  . NO PAST SURGERIES         Family History  Adopted: Yes  Problem Relation Age of Onset  . Cancer Father   . Lung cancer Father   . Heart failure Sister   . Breast cancer Sister   . Emphysema Mother     Social History   Tobacco Use  . Smoking status: Current Every Day Smoker    Packs/day: 0.50    Years: 45.00    Pack years: 22.50    Types: Cigarettes  . Smokeless tobacco: Never Used  . Tobacco comment: 2ppd x15 years, then 1/2ppd, now 1ppd x 3  Vaping Use  . Vaping Use: Never used  Substance Use Topics  . Alcohol use: Yes    Comment: scotch once a week  . Drug use: Yes    Types: Cocaine, Marijuana    Comment: crack cocaine quit 2003    Home Medications Prior to Admission medications   Medication Sig Start Date End Date Taking? Authorizing Provider  albuterol (PROVENTIL HFA;VENTOLIN HFA) 108 (90 BASE) MCG/ACT inhaler Inhale 2 puffs  into the lungs every 6 (six) hours as needed for wheezing or shortness of breath.    Yes [provider]  atorvastatin (LIPITOR) 20 MG tablet Take 1 tablet (20 mg total) by mouth daily. 09/25/19  Yes Glendale Chard, MD  buPROPion (WELLBUTRIN XL) 150 MG 24 hr tablet Take 1 tablet (150 mg total) by mouth daily. Patient taking differently: Take 150 mg by mouth daily as needed (feeling down).  09/23/19  Yes Glendale Chard, MD  Carboxymethylcellul-Glycerin (CLEAR EYES FOR DRY EYES OP) Apply 1 drop to eye daily as needed (for dry eyes).   Yes [provider]  cyclobenzaprine (  FLEXERIL) 10 MG tablet TAKE 1 TABLET (10 MG TOTAL) BY MOUTH AS NEEDED FOR MUSCLE SPASMS. Patient taking differently: Take 10 mg by mouth daily as needed for muscle spasms.  12/26/19  Yes Glendale Chard, MD  diclofenac Sodium (VOLTAREN) 1 % GEL Apply 2 g topically 2 (two) times daily.   Yes [provider]  Fluticasone-Salmeterol (ADVAIR DISKUS) 250-50 MCG/DOSE AEPB Inhale 1 puff into the lungs 2 (two) times daily. 06/19/19  Yes Glendale Chard, MD  ipratropium-albuterol (DUONEB) 0.5-2.5 (3) MG/3ML SOLN INHALE 1 VIAL (3MLS) USING NEBULIZER 4 TIMES PER DAY Patient taking differently: Inhale 3 mLs into the lungs every 6 (six) hours as needed (shortness of breath).  07/11/18  Yes Glendale Chard, MD  linaclotide [facility]) 72 MCG capsule Take 1 capsule (72 mcg total) by mouth daily before breakfast. Patient taking differently: Take 72 mcg by mouth daily as needed (constipation).  04/28/19  Yes Glendale Chard, MD  losartan (COZAAR) 50 MG tablet TAKE 1 TABLET BY MOUTH DAILY Patient taking differently: Take 50 mg by mouth daily.  03/03/19  Yes Glendale Chard, MD  Magnesium 400 MG CAPS Take 400 mg by mouth daily. 06/05/18  Yes Glendale Chard, MD  montelukast (SINGULAIR) 10 MG tablet Take 10 mg by mouth at bedtime.  09/26/16  Yes [provider]  omeprazole (PRILOSEC) 40 MG capsule TAKE 1 CAPSULE BY MOUTH EVERY DAY  BEFORE A MEAL Patient taking differently: Take 40 mg by mouth daily.  11/05/19  Yes Glendale Chard, MD  pregabalin (LYRICA) 50 MG capsule TAKE 1 CAPSULE BY MOUTH TWICE DAILY Patient taking differently: Take 50 mg by mouth 2 (two) times daily.  08/07/19  Yes Glendale Chard, MD  tamsulosin (FLOMAX) 0.4 MG CAPS capsule Take 0.4 mg by mouth daily. 12/08/19  Yes [provider]  Budeson-Glycopyrrol-Formoterol (BREZTRI AEROSPHERE) 160-9-4.8 MCG/ACT AERO Inhale 1 puff into the lungs 2 (two) times daily.     [provider]  metoprolol tartrate (LOPRESSOR) 100 MG tablet TAKE 1 TABLET 2 HOURS PRIOR TO CT Patient not taking: Reported on 01/13/2020 01/07/20   Skeet Latch, MD  Respiratory Therapy Supplies (FLUTTER) DEVI Use as directed 02/02/17   Tanda Rockers, MD    Allergies    Penicillins  Review of Systems   Review of Systems  Constitutional: Positive for diaphoresis and fatigue (on exertion).  Respiratory: Positive for shortness of breath.   Cardiovascular: Positive for chest pain.  Gastrointestinal: Positive for nausea.  Neurological: Positive for light-headedness.  All other systems reviewed and are negative.   Physical Exam Updated Vital Signs BP 120/85   Pulse 80   Temp 97.8 F (36.6 C) (Oral)   Resp 18   SpO2 99%   Physical Exam Constitutional:      Appearance: He is well-developed.     Comments: NAD. Non toxic.   HENT:     Head: Normocephalic and atraumatic.     Nose: Nose normal.  Eyes:     General: Lids are normal.     Conjunctiva/sclera: Conjunctivae normal.  Neck:     Trachea: Trachea normal.     Comments: Trachea midline.  Cardiovascular:     Rate and Rhythm: Normal rate and regular rhythm.     Pulses:          Radial pulses are 1+ on the right side and 1+ on the left side.       Dorsalis pedis pulses are 1+ on the right side and 1+ on the left side.  Heart sounds: Normal heart sounds, S1 normal and S2 normal.     Comments: No murmurs.  No LE edema or calf tenderness.  Pulmonary:     Effort: Pulmonary effort is normal.     Breath sounds: Normal breath sounds.  Abdominal:     General: Bowel sounds are normal.     Palpations: Abdomen is soft.     Tenderness: There is no abdominal tenderness.     Comments: No epigastric or upper abdominal tenderness.  Musculoskeletal:     Cervical back: Normal range of motion.  Skin:    General: Skin is warm and dry.     Capillary Refill: Capillary refill takes less than 2 seconds.     Comments: No rash to chest wall  Neurological:     Mental Status: He is alert.     GCS: GCS eye subscore is 4. GCS verbal subscore is 5. GCS motor subscore is 6.     Comments: Sensation and strength intact in upper/lower extremities  Psychiatric:        Speech: Speech normal.        Behavior: Behavior normal.        Thought Content: Thought content normal.     ED Results / Procedures / Treatments   Labs (all labs ordered are listed, but only abnormal results are displayed) Labs Reviewed  BASIC METABOLIC PANEL - Abnormal; Notable for the following components:      Result Value   Glucose, Bld 134 (*)    All other components within normal limits  CBC - Abnormal; Notable for the following components:   RDW 15.7 (*)    All other components within normal limits  HEPATIC FUNCTION PANEL - Abnormal; Notable for the following components:   Albumin 3.4 (*)    All other components within normal limits  CBG MONITORING, ED - Abnormal; Notable for the following components:   Glucose-Capillary 121 (*)    All other components within normal limits  TROPONIN I (HIGH SENSITIVITY) - Abnormal; Notable for the following components:   Troponin I (High Sensitivity) 61 (*)    All other components within normal limits  RESPIRATORY PANEL BY RT PCR (FLU A&B, COVID)  LIPASE, BLOOD  RAPID URINE DRUG SCREEN, HOSP PERFORMED  TROPONIN I (HIGH SENSITIVITY)    EKG EKG Interpretation  Date/Time:  Tuesday January 13 2020 19:12:45 EST Ventricular Rate:  85 PR Interval:    QRS Duration: 92 QT Interval:  372 QTC Calculation: 443 R Axis:   72 Text Interpretation: Sinus arrhythmia Anteroseptal infarct, age indeterminate Confirmed by Lennice Sites [phone]) on 01/13/2020 7:26:26 PM   Radiology DG Chest Portable 1 View  Result Date: 01/13/2020 CLINICAL DATA:  chest pain EXAM: PORTABLE CHEST 1 VIEW COMPARISON:  December 18, 2019 FINDINGS: The heart size and mediastinal contours are within normal limits. Both lungs are clear. The visualized skeletal structures are unremarkable. IMPRESSION: No active disease. Electronically Signed   By: Prudencio Pair M.D.   On: 01/13/2020 20:06    Procedures Procedures (including critical care time)  Medications Ordered in ED Medications  aspirin chewable tablet 324 mg (324 mg Oral Not Given 01/13/20 1934)  nitroGLYCERIN (NITROSTAT) SL tablet 0.4 mg (has no administration in time range)    ED Course  I have reviewed the triage vital signs and the nursing notes.  Pertinent labs & imaging results that were available during my care of the patient were reviewed by me and considered in my medical decision making (see  chart for details).  Clinical Course as of Jan 13 2120  Tue Jan 13, 2020  2047 Troponin I (High Sensitivity)(!): 33 [CG]    Clinical Course User Index [CG] Kinnie Feil, PA-C   MDM Rules/Calculators/A&P HEAR Score: 6                        EMR, triage nurse notes reviewed to obtain more history and MDM  Seen in the ER for chest pain and had normal CT dissection study, undetectable troponins. Seen by cardiology Dr. Oval Linsey 11/10 who suspected chest pain was atypical and possibly musculoskeletal but patient also reporting exertional fatigue and dyspnea. She has ordered coronary CT for further testing.  DDx here includes coronary vasospasm, ACS/non-STEMI. No upper respiratory symptoms like cough, fever to suggest infection process. Doubt PE given  chronicity of symptoms, recent extensive work-up in the ED and CT dissection study that was negative and no PE. No distal neuro pulse deficits here today. No abdominal tenderness. Doubt central nervous system process like TIA or CVA.  ER work-up initiated in triage including basic lab work, EKG, chest x-ray and troponin. Have added LFTs, lipase, UDS.  ER work-up personally visualized and interpreted.  Labs reviewed-essentially normal however troponin is 61.  Imaging reviewed-EKG with some PACs but no ischemic changes, right ventricular strain. Chest x-ray without acute findings.  Meds ordered: Patient had improvement in chest pain after nitroglycerin SL on route, will repeat as he still having mild lingering chest pain. Aspirin 324 mg.   Continuous cardiac and pulse oximeter monitoring.  2115: Patient re-evaluated and no clinical decline. RN to give nitroglycerin.    HEART score is 6.  Given recurrent CP, trop leak will speak to cardiology for admission. Concern for unstable angina, coronary vasospasm?   2120: Spoke to cardiology Dr Kalman Shan, will admit to cardiology service. Anticipate cath. NPO at midnight. Start heparin, nitro SL for CP consider gtt if repeated doses needed. Patient and wife updated.  Final Clinical Impression(s) / ED Diagnoses Final diagnoses:  Elevated troponin    Rx / DC Orders ED Discharge Orders    None         Kinnie Feil, PA-C 01/13/20 2122    Lennice Sites, DO 01/13/20 2136

## 2020-01-14 ENCOUNTER — Other Ambulatory Visit: Payer: Self-pay

## 2020-01-14 ENCOUNTER — Encounter (HOSPITAL_COMMUNITY)
Admission: EM | Disposition: A | Discharge status: Home / Self Care | Payer: Self-pay | Source: Home / Self Care | Attending: Cardiovascular Disease

## 2020-01-14 ENCOUNTER — Encounter (HOSPITAL_COMMUNITY): Payer: Self-pay | Admitting: Cardiovascular Disease

## 2020-01-14 DIAGNOSIS — Z720 Tobacco use: Secondary | ICD-10-CM

## 2020-01-14 DIAGNOSIS — I2102 ST elevation (STEMI) myocardial infarction involving left anterior descending coronary artery: Secondary | ICD-10-CM

## 2020-01-14 DIAGNOSIS — E119 Type 2 diabetes mellitus without complications: Secondary | ICD-10-CM

## 2020-01-14 DIAGNOSIS — I213 ST elevation (STEMI) myocardial infarction of unspecified site: Secondary | ICD-10-CM

## 2020-01-14 DIAGNOSIS — J449 Chronic obstructive pulmonary disease, unspecified: Secondary | ICD-10-CM

## 2020-01-14 DIAGNOSIS — I2510 Atherosclerotic heart disease of native coronary artery without angina pectoris: Secondary | ICD-10-CM

## 2020-01-14 DIAGNOSIS — E7800 Pure hypercholesterolemia, unspecified: Secondary | ICD-10-CM

## 2020-01-14 HISTORY — PX: CORONARY/GRAFT ACUTE MI REVASCULARIZATION: CATH118305

## 2020-01-14 HISTORY — PX: LEFT HEART CATH AND CORONARY ANGIOGRAPHY: CATH118249

## 2020-01-14 LAB — CBC
HCT: 50.9 % (ref 39.0–52.0)
HCT: 45.8 % (ref 39.0–52.0)
Hemoglobin: 16.3 g/dL (ref 13.0–17.0)
Hemoglobin: 14.6 g/dL (ref 13.0–17.0)
MCH: 29.3 pg (ref 26.0–34.0)
MCH: 28.4 pg (ref 26.0–34.0)
MCHC: 32 g/dL (ref 30.0–36.0)
MCHC: 31.9 g/dL (ref 30.0–36.0)
MCV: 91.4 fL (ref 80.0–100.0)
MCV: 89.1 fL (ref 80.0–100.0)
Platelets: 241 10*3/uL (ref 150–400)
Platelets: 212 10*3/uL (ref 150–400)
RBC: 5.57 MIL/uL (ref 4.22–5.81)
RBC: 5.14 MIL/uL (ref 4.22–5.81)
RDW: 15.8 % — ABNORMAL HIGH (ref 11.5–15.5)
RDW: 15.6 % — ABNORMAL HIGH (ref 11.5–15.5)
WBC: 10.7 10*3/uL — ABNORMAL HIGH (ref 4.0–10.5)
WBC: 9.1 10*3/uL (ref 4.0–10.5)
nRBC: 0 % (ref 0.0–0.2)
nRBC: 0 % (ref 0.0–0.2)

## 2020-01-14 LAB — BASIC METABOLIC PANEL
Anion gap: 8 (ref 5–15)
BUN: 10 mg/dL (ref 8–23)
CO2: 23 mmol/L (ref 22–32)
Calcium: 9 mg/dL (ref 8.9–10.3)
Chloride: 107 mmol/L (ref 98–111)
Creatinine, Ser: 0.98 mg/dL (ref 0.61–1.24)
GFR, Estimated: >60 mL/min (ref >60)
Glucose, Bld: 142 mg/dL — ABNORMAL HIGH (ref 70–99)
Potassium: 3.9 mmol/L (ref 3.5–5.1)
Sodium: 138 mmol/L (ref 135–145)

## 2020-01-14 LAB — LIPID PANEL
Cholesterol: 164 mg/dL (ref 0–200)
HDL: 61 mg/dL (ref >40)
LDL Cholesterol: 91 mg/dL (ref 0–99)
Total CHOL/HDL Ratio: 2.7 RATIO
Triglycerides: 62 mg/dL (ref <150)
VLDL: 12 mg/dL (ref 0–40)

## 2020-01-14 LAB — POCT ACTIVATED CLOTTING TIME: Activated Clotting Time: 296 seconds

## 2020-01-14 LAB — PROTIME-INR
INR: 1 (ref 0.8–1.2)
Prothrombin Time: 12.4 seconds (ref 11.4–15.2)

## 2020-01-14 LAB — RESPIRATORY PANEL BY RT PCR (FLU A&B, COVID)
Influenza A by PCR: NEGATIVE
Influenza B by PCR: NEGATIVE
SARS Coronavirus 2 by RT PCR: NEGATIVE

## 2020-01-14 LAB — APTT: aPTT: 71 seconds — ABNORMAL HIGH (ref 24–36)

## 2020-01-14 LAB — TROPONIN I (HIGH SENSITIVITY)
Troponin I (High Sensitivity): 83 ng/L — ABNORMAL HIGH (ref <18)
Troponin I (High Sensitivity): 106 ng/L (ref <18)

## 2020-01-14 LAB — MRSA PCR SCREENING: MRSA by PCR: NEGATIVE

## 2020-01-14 SURGERY — CORONARY/GRAFT ACUTE MI REVASCULARIZATION
Anesthesia: LOCAL

## 2020-01-14 MED ORDER — LOSARTAN POTASSIUM 50 MG PO TABS: 50.0000 mg | ORAL_TABLET | Freq: Every day | ORAL | Status: DC

## 2020-01-14 MED ORDER — IOHEXOL 350 MG/ML SOLN
INTRAVENOUS | Status: AC
  Filled 2020-01-14: qty 1

## 2020-01-14 MED ORDER — MONTELUKAST SODIUM 10 MG PO TABS
10.0000 mg | ORAL_TABLET | Freq: Every day | ORAL | Status: DC
  Administered 2020-01-14: 10 mg via ORAL
  Filled 2020-01-14: qty 1

## 2020-01-14 MED ORDER — VERAPAMIL HCL 2.5 MG/ML IV SOLN
INTRAVENOUS | Status: DC | PRN
  Administered 2020-01-14: 10 mL via INTRA_ARTERIAL

## 2020-01-14 MED ORDER — MIDAZOLAM HCL 2 MG/2ML IJ SOLN
INTRAMUSCULAR | Status: DC | PRN
  Administered 2020-01-14: 2 mg via INTRAVENOUS

## 2020-01-14 MED ORDER — HYDRALAZINE HCL 20 MG/ML IJ SOLN: 10.0000 mg | INTRAMUSCULAR | Status: AC | PRN

## 2020-01-14 MED ORDER — MIDAZOLAM HCL 2 MG/2ML IJ SOLN
INTRAMUSCULAR | Status: AC
  Filled 2020-01-14: qty 2

## 2020-01-14 MED ORDER — HEPARIN SODIUM (PORCINE) 1000 UNIT/ML IJ SOLN
INTRAMUSCULAR | Status: DC | PRN
  Administered 2020-01-14: 8000 [IU] via INTRAVENOUS

## 2020-01-14 MED ORDER — HEPARIN SODIUM (PORCINE) 5000 UNIT/ML IJ SOLN
5000.0000 [IU] | Freq: Three times a day (TID) | INTRAMUSCULAR | Status: DC
  Administered 2020-01-14 – 2020-01-15 (×2): 5000 [IU] via SUBCUTANEOUS
  Filled 2020-01-14 (×2): qty 1

## 2020-01-14 MED ORDER — CYCLOBENZAPRINE HCL 10 MG PO TABS
10.0000 mg | ORAL_TABLET | Freq: Every day | ORAL | Status: DC | PRN
  Administered 2020-01-14: 10 mg via ORAL
  Filled 2020-01-14 (×3): qty 1

## 2020-01-14 MED ORDER — MOMETASONE FURO-FORMOTEROL FUM 200-5 MCG/ACT IN AERO
2.0000 | INHALATION_SPRAY | Freq: Two times a day (BID) | RESPIRATORY_TRACT | Status: DC
  Administered 2020-01-14 – 2020-01-15 (×3): 2 via RESPIRATORY_TRACT
  Filled 2020-01-14: qty 8.8

## 2020-01-14 MED ORDER — VERAPAMIL HCL 2.5 MG/ML IV SOLN
INTRAVENOUS | Status: DC | PRN
  Administered 2020-01-14: 2 mg via INTRAVENOUS

## 2020-01-14 MED ORDER — HEPARIN (PORCINE) IN NACL 1000-0.9 UT/500ML-% IV SOLN
INTRAVENOUS | Status: DC | PRN
  Administered 2020-01-14 (×2): 500 mL

## 2020-01-14 MED ORDER — BUPROPION HCL ER (XL) 150 MG PO TB24
150.0000 mg | ORAL_TABLET | Freq: Every day | ORAL | Status: DC
  Administered 2020-01-15: 150 mg via ORAL
  Filled 2020-01-14 (×2): qty 1

## 2020-01-14 MED ORDER — NITROGLYCERIN 1 MG/10 ML FOR IR/CATH LAB
INTRA_ARTERIAL | Status: AC
  Filled 2020-01-14: qty 10

## 2020-01-14 MED ORDER — TICAGRELOR 90 MG PO TABS
ORAL_TABLET | ORAL | Status: DC | PRN
  Administered 2020-01-14: 180 mg via ORAL

## 2020-01-14 MED ORDER — FENTANYL CITRATE (PF) 100 MCG/2ML IJ SOLN
INTRAMUSCULAR | Status: AC
  Filled 2020-01-14: qty 2

## 2020-01-14 MED ORDER — ATORVASTATIN CALCIUM 80 MG PO TABS
80.0000 mg | ORAL_TABLET | Freq: Every day | ORAL | Status: DC
  Administered 2020-01-14 – 2020-01-15 (×2): 80 mg via ORAL
  Filled 2020-01-14 (×2): qty 1

## 2020-01-14 MED ORDER — LOSARTAN POTASSIUM 25 MG PO TABS
25.0000 mg | ORAL_TABLET | Freq: Every day | ORAL | Status: DC
  Administered 2020-01-14: 25 mg via ORAL
  Filled 2020-01-14: qty 1

## 2020-01-14 MED ORDER — MAGNESIUM OXIDE 400 (241.3 MG) MG PO TABS
400.0000 mg | ORAL_TABLET | Freq: Every day | ORAL | Status: DC
  Administered 2020-01-14 – 2020-01-15 (×2): 400 mg via ORAL
  Filled 2020-01-14 (×2): qty 1

## 2020-01-14 MED ORDER — FENTANYL CITRATE (PF) 100 MCG/2ML IJ SOLN
INTRAMUSCULAR | Status: DC | PRN
  Administered 2020-01-14: 25 ug via INTRAVENOUS

## 2020-01-14 MED ORDER — LABETALOL HCL 5 MG/ML IV SOLN: 10.0000 mg | INTRAVENOUS | Status: AC | PRN

## 2020-01-14 MED ORDER — ACETAMINOPHEN 325 MG PO TABS: 650.0000 mg | ORAL_TABLET | ORAL | Status: DC | PRN

## 2020-01-14 MED ORDER — IOHEXOL 350 MG/ML SOLN
INTRAVENOUS | Status: DC | PRN
  Administered 2020-01-14: 110 mL

## 2020-01-14 MED ORDER — TICAGRELOR 90 MG PO TABS
ORAL_TABLET | ORAL | Status: AC
  Filled 2020-01-14: qty 2

## 2020-01-14 MED ORDER — VERAPAMIL HCL 2.5 MG/ML IV SOLN
INTRAVENOUS | Status: AC
  Filled 2020-01-14: qty 2

## 2020-01-14 MED ORDER — SODIUM CHLORIDE 0.9% FLUSH: 3.0000 mL | INTRAVENOUS | Status: DC | PRN

## 2020-01-14 MED ORDER — CHLORHEXIDINE GLUCONATE CLOTH 2 % EX PADS: 6.0000 | MEDICATED_PAD | Freq: Every day | CUTANEOUS | Status: DC

## 2020-01-14 MED ORDER — OXYCODONE HCL 5 MG PO TABS
5.0000 mg | ORAL_TABLET | ORAL | Status: DC | PRN
  Administered 2020-01-14: 10 mg via ORAL
  Filled 2020-01-14: qty 2

## 2020-01-14 MED ORDER — TICAGRELOR 90 MG PO TABS
90.0000 mg | ORAL_TABLET | Freq: Two times a day (BID) | ORAL | Status: DC
  Administered 2020-01-14 – 2020-01-15 (×3): 90 mg via ORAL
  Filled 2020-01-14 (×3): qty 1

## 2020-01-14 MED ORDER — PREGABALIN 25 MG PO CAPS
50.0000 mg | ORAL_CAPSULE | Freq: Two times a day (BID) | ORAL | Status: DC
  Administered 2020-01-14 – 2020-01-15 (×4): 50 mg via ORAL
  Filled 2020-01-14 (×5): qty 2

## 2020-01-14 MED ORDER — NITROGLYCERIN 1 MG/10 ML FOR IR/CATH LAB
INTRA_ARTERIAL | Status: DC | PRN
  Administered 2020-01-14: 150 ug via INTRACORONARY

## 2020-01-14 MED ORDER — ASPIRIN 81 MG PO CHEW
81.0000 mg | CHEWABLE_TABLET | Freq: Every day | ORAL | Status: DC
  Administered 2020-01-14 – 2020-01-15 (×2): 81 mg via ORAL
  Filled 2020-01-14 (×2): qty 1

## 2020-01-14 MED ORDER — HEPARIN (PORCINE) IN NACL 1000-0.9 UT/500ML-% IV SOLN
INTRAVENOUS | Status: AC
  Filled 2020-01-14: qty 1000

## 2020-01-14 MED ORDER — TAMSULOSIN HCL 0.4 MG PO CAPS
0.4000 mg | ORAL_CAPSULE | Freq: Every day | ORAL | Status: DC
  Administered 2020-01-14 – 2020-01-15 (×2): 0.4 mg via ORAL
  Filled 2020-01-14 (×2): qty 1

## 2020-01-14 MED ORDER — LIDOCAINE HCL (PF) 1 % IJ SOLN
INTRAMUSCULAR | Status: DC | PRN
  Administered 2020-01-14: 2 mL

## 2020-01-14 MED ORDER — SODIUM CHLORIDE 0.9 % WEIGHT BASED INFUSION
1.0000 mL/kg/h | INTRAVENOUS | Status: AC
  Administered 2020-01-14: 0.998 mL/kg/h via INTRAVENOUS

## 2020-01-14 MED ORDER — SODIUM CHLORIDE 0.9% FLUSH
3.0000 mL | Freq: Two times a day (BID) | INTRAVENOUS | Status: DC
  Administered 2020-01-14 – 2020-01-15 (×2): 3 mL via INTRAVENOUS

## 2020-01-14 MED ORDER — ONDANSETRON HCL 4 MG/2ML IJ SOLN: 4.0000 mg | Freq: Four times a day (QID) | INTRAMUSCULAR | Status: DC | PRN

## 2020-01-14 MED ORDER — PANTOPRAZOLE SODIUM 40 MG PO TBEC
40.0000 mg | DELAYED_RELEASE_TABLET | Freq: Every day | ORAL | Status: DC
  Administered 2020-01-14 – 2020-01-15 (×2): 40 mg via ORAL
  Filled 2020-01-14 (×2): qty 1

## 2020-01-14 MED ORDER — ORAL CARE MOUTH RINSE
15.0000 mL | Freq: Two times a day (BID) | OROMUCOSAL | Status: DC
  Administered 2020-01-14: 15 mL via OROMUCOSAL

## 2020-01-14 MED ORDER — METOPROLOL TARTRATE 12.5 MG HALF TABLET
12.5000 mg | ORAL_TABLET | Freq: Two times a day (BID) | ORAL | Status: DC
  Administered 2020-01-14 – 2020-01-15 (×4): 12.5 mg via ORAL
  Filled 2020-01-14 (×4): qty 1

## 2020-01-14 MED ORDER — SODIUM CHLORIDE 0.9 % IV SOLN: 250.0000 mL | INTRAVENOUS | Status: DC | PRN

## 2020-01-14 MED ORDER — NICOTINE 7 MG/24HR TD PT24
7.0000 mg | MEDICATED_PATCH | Freq: Every day | TRANSDERMAL | Status: DC
  Administered 2020-01-14: 7 mg via TRANSDERMAL
  Filled 2020-01-14: qty 1

## 2020-01-14 MED ORDER — LIDOCAINE HCL (PF) 1 % IJ SOLN
INTRAMUSCULAR | Status: AC
  Filled 2020-01-14: qty 30

## 2020-01-14 MED ORDER — IPRATROPIUM-ALBUTEROL 0.5-2.5 (3) MG/3ML IN SOLN
3.0000 mL | Freq: Four times a day (QID) | RESPIRATORY_TRACT | Status: DC | PRN
  Administered 2020-01-14: 3 mL via RESPIRATORY_TRACT
  Filled 2020-01-14: qty 3

## 2020-01-14 MED ORDER — HEPARIN SODIUM (PORCINE) 1000 UNIT/ML IJ SOLN
INTRAMUSCULAR | Status: AC
  Filled 2020-01-14: qty 1

## 2020-01-14 SURGICAL SUPPLY — 18 items
BALLN SAPPHIRE 2.5X12 (BALLOONS) ×2
BALLN SAPPHIRE ~~LOC~~ 3.75X10 (BALLOONS) ×1 IMPLANT
BALLOON SAPPHIRE 2.5X12 (BALLOONS) IMPLANT
CATH 5FR JL3.5 JR4 ANG PIG MP (CATHETERS) ×1 IMPLANT
CATH LAUNCHER 5F EBU3.5 (CATHETERS) ×1 IMPLANT
CATH LAUNCHER 6FR EBU3.5 (CATHETERS) ×1 IMPLANT
DEVICE RAD COMP TR BAND LRG (VASCULAR PRODUCTS) ×1 IMPLANT
GLIDESHEATH SLEND SS 6F .021 (SHEATH) ×1 IMPLANT
GUIDEWIRE INQWIRE 1.5J.035X260 (WIRE) IMPLANT
INQWIRE 1.5J .035X260CM (WIRE) ×4
KIT ENCORE 26 ADVANTAGE (KITS) ×1 IMPLANT
KIT HEART LEFT (KITS) ×2 IMPLANT
PACK CARDIAC CATHETERIZATION (CUSTOM PROCEDURE TRAY) ×2 IMPLANT
STENT SYNERGY XD 3.50X16 (Permanent Stent) IMPLANT
SYNERGY XD 3.50X16 (Permanent Stent) ×2 IMPLANT
TRANSDUCER W/STOPCOCK (MISCELLANEOUS) ×2 IMPLANT
TUBING CIL FLEX 10 FLL-RA (TUBING) ×2 IMPLANT
WIRE COUGAR XT STRL 190CM (WIRE) ×1 IMPLANT

## 2020-01-14 NOTE — Plan of Care (Signed)

## 2020-01-14 NOTE — ED Notes (Signed)
Pt had an episode of increased chest pain, pain described as a squeezing feeling, SOB increased, and pt became diaphoretic. EKG performed.

## 2020-01-14 NOTE — ED Notes (Signed)
Dr. Jilda Roche

## 2020-01-14 NOTE — ED Notes (Signed)
Dr. Wickline at bedside.  

## 2020-01-14 NOTE — ED Notes (Signed)
Pt transported to cath lab at this time.

## 2020-01-14 NOTE — Progress Notes (Signed)
C/O SOB, RT notified. Pt seems in no distress, PRN is given by RT.

## 2020-01-14 NOTE — Progress Notes (Signed)
Progress Note  Patient Name: Peter Sims Date of Encounter: 01/14/2020  [facility] Cardiologist: Skeet Latch, MD   Subjective   Feeling well.  No further chest pain.  Breathing is stable.  Inpatient Medications    Scheduled Meds: . aspirin  81 mg Oral Daily  . atorvastatin  80 mg Oral Daily  . buPROPion  150 mg Oral Daily  . heparin  5,000 Units Subcutaneous Q8H  . losartan  50 mg Oral Daily  . magnesium oxide  400 mg Oral Daily  . metoprolol tartrate  12.5 mg Oral BID  . mometasone-formoterol  2 puff Inhalation BID  . montelukast  10 mg Oral QHS  . nicotine  7 mg Transdermal Once  . pantoprazole  40 mg Oral Daily  . pregabalin  50 mg Oral BID  . sodium chloride flush  3 mL Intravenous Q12H  . tamsulosin  0.4 mg Oral Daily  . ticagrelor  90 mg Oral BID   Continuous Infusions: . sodium chloride    . sodium chloride 0.998 mL/kg/hr (01/14/20 0700)   PRN Meds: sodium chloride, acetaminophen, cyclobenzaprine, hydrALAZINE, ipratropium-albuterol, labetalol, nitroGLYCERIN, ondansetron (ZOFRAN) IV, oxyCODONE, sodium chloride flush   Vital Signs    Vitals:   01/14/20 0645 01/14/20 0700 01/14/20 0744 01/14/20 0800  BP:  110/88  134/76  Pulse: 79 75  69  Resp: 19 18  20   Temp:   97.6 F (36.4 C)   TempSrc:   Oral   SpO2: 100% 100%  100%  Weight:      Height:        Intake/Output Summary (Last 24 hours) at 01/14/2020 0810 Last data filed at 01/14/2020 0800 Gross per 24 hour  Intake 478.5 ml  Output --  Net 478.5 ml   Last 3 Weights 01/14/2020 01/13/2020 01/07/2020  Weight (lbs) 191 lb 5.8 oz 192 lb 3.9 oz 192 lb 6.4 oz  Weight (kg) 86.8 kg 87.2 kg 87.272 kg      Telemetry    Sinus rhythm.  Atrial bigeminy.  PACs.- Personally Reviewed  ECG    01/13/2020: Sinus tachycardia.  Rate 118 bpm.  Anterolateral ST elevations- Personally Reviewed  Physical Exam   VS:  BP 134/76 (BP Location: Left Arm)   Pulse 69   Temp 97.6 F (36.4 C) (Oral)    Resp 20   Ht 6' (1.829 m)   Wt 86.8 kg   SpO2 100%   BMI 25.95 kg/m  , BMI Body mass index is 25.95 kg/m. GENERAL:  Well appearing HEENT: Pupils equal round and reactive, fundi not visualized, oral mucosa unremarkable NECK:  No jugular venous distention, waveform within normal limits, carotid upstroke brisk and symmetric, no bruits LUNGS:  Clear to auscultation bilaterally HEART:  RRR.  PMI not displaced or sustained,S1 and S2 within normal limits, no S3, no S4, no clicks, no rubs, no murmurs ABD:  Flat, positive bowel sounds normal in frequency in pitch, no bruits, no rebound, no guarding, no midline pulsatile mass, no hepatomegaly, no splenomegaly EXT:  2 plus pulses throughout, no edema, no cyanosis no clubbing.  R radial C/D/I SKIN:  No rashes no nodules NEURO:  Cranial nerves II through XII grossly intact, motor grossly intact throughout PSYCH:  Cognitively intact, oriented to person place and time   Labs    High Sensitivity Troponin:   Recent Labs  Lab 12/18/19 1543 01/13/20 1932 01/13/20 2130 01/14/20 0128 01/14/20 0339  TROPONINIHS 7 61* 82* 83* 106*  Chemistry Recent Labs  Lab 01/13/20 1932 01/14/20 0339  NA 138 138  K 3.7 3.9  CL 102 107  CO2 28 23  GLUCOSE 134* 142*  BUN 11 10  CREATININE 1.13 0.98  CALCIUM 9.4 9.0  PROT 6.6  --   ALBUMIN 3.4*  --   AST 19  --   ALT 28  --   ALKPHOS 86  --   BILITOT 0.6  --   GFRNONAA >60 >60  ANIONGAP 8 8     Hematology Recent Labs  Lab 01/13/20 1932 01/14/20 0128 01/14/20 0339  WBC 8.1 10.7* 9.1  RBC 5.39 5.57 5.14  HGB 16.0 16.3 14.6  HCT 49.2 50.9 45.8  MCV 91.3 91.4 89.1  MCH 29.7 29.3 28.4  MCHC 32.5 32.0 31.9  RDW 15.7* 15.8* 15.6*  PLT 238 241 212    BNPNo results for input(s): BNP, PROBNP in the last 168 hours.   DDimer No results for input(s): DDIMER in the last 168 hours.   Radiology    CARDIAC CATHETERIZATION  Result Date: 01/14/2020 1.  Severe mid LAD stenosis (culprit  lesion) treated successfully with PCI using a 3.5 x 16 mm Synergy DES 2.  Mild nonobstructive proximal LAD, mid circumflex, OM 2, and proximal RCA stenoses 3.  Normal LV systolic function with normal LVEDP Recommendations: Aggressive medical therapy for secondary risk reduction.  Tobacco cessation.  The patient is loaded with ticagrelor 180 mg in the Cath Lab.  He is started on a high intensity statin drug and a beta-blocker.  Discussed cardiac catheterization findings with patient and his wife.  If no complications arise, should be eligible for hospital discharge within 24 hours.  DG Chest Portable 1 View  Result Date: 01/13/2020 CLINICAL DATA:  chest pain EXAM: PORTABLE CHEST 1 VIEW COMPARISON:  December 18, 2019 FINDINGS: The heart size and mediastinal contours are within normal limits. Both lungs are clear. The visualized skeletal structures are unremarkable. IMPRESSION: No active disease. Electronically Signed   By: Prudencio Pair M.D.   On: 01/13/2020 20:06    Cardiac Studies   LHC 01/13/20: Diagnostic Dominance: Right  Intervention     Patient Profile     69 y.o. male with CAD, hypertension, hyperlipidemia, tobacco abuse, bullous emphysema, COPD, and GERD  admitted with STEMI.  Assessment & Plan    # STEMI:  Patient was recently seen in clinic for chest pain and referred for coronary CT-A.  However he presented to hospital with an anterior STEMI last night.  High-sensitivity troponin was elevated to 106.  He was found to have severe mid LAD stenosis that was successfully PCI with a drug-eluting stent.  Otherwise mild to moderate disease.  Systolic function was normal, as was LVEDP.  Therefore we will not get an echo at this time.  Continue DAPT with ticagrelor for 1 year.  Continue atorvastatin and metoprolol.  # Hypertension:  Losartan was started at cath.  However his blood pressure was low so we will reduce it to 25 mg daily.  Metoprolol was also started this admission.  He will  need a basic metabolic panel in a week given that the ARB is new.    # Tobacco abuse:  Smoking cessation advised.  He understands that he needs to quit smoking now.  Nicotine patch was applied.   For questions or updates, please contact [place] Please consult [email or web address] for contact info under        Signed, Skeet Latch, MD  01/14/2020, 8:10  AM

## 2020-01-14 NOTE — ED Notes (Signed)
Pt ready for Cath lab at this time.

## 2020-01-14 NOTE — H&P (Signed)
Cardiology History & Physical    Patient ID: [name] MRN: [id number], DOB/AGE: Dec 15, 1950   Admit date: 01/13/2020  Primary Physician: Peter Chard, MD Primary Cardiologist: Peter Latch, MD  Patient Profile    Peter Sims is a 69 year old male with hypertension, hyperlipidemia, current tobacco use, borderline type 2 DM, COPD, and GERD who presents today for evaluation of recurrent chest pain.  History of Present Illness    Peter Sims woke up at 2 am this morning with a leg cramp and on the way to the bathroom developed sudden onset chest pain with radiation into the neck, associated with paraesthesias of his hands, weakness of his left arm, nausea, diaphoresis, and mild dyspnea. He sat down and the symptoms subsided after about 15 minutes. He had 2 more of the episodes during the day, one at 10 am while carrying a propane tank that was relieved with rest, and a third episode while walking to the bathroom again. The third episode was crushing and severe enough for him to call EMS.  His symptoms had resolved at the time of their evaluation and he was initially not transported, but came to the ED after another recurrence of his symptoms shortly after. En route he had more chest pain that responded well to SL nitro, and another nitro-responsive episode shortly after arrival to the ED. ASA was given en route. Initial ECGs showed only nonspecific ST-T wave changes. Troponin was 61. UDS was sent that was positive for THC but negative for cocaine, he also denies any cocaine use for the past 18 years but does continue to smoke cigarettes. I was consulted for further evaluation and started heparin with bolus and plan to admit for NSTEMI.   While in the ED he had another severe chest pain episode associate with marked ST elevation in the anterior and lateral leads prompting CODE STEMI activation. He was given SL nitro again with resolution of the ST changes and chest pain  shortly after. He was taken emergently to the cath lab and found to have a high-grade thrombotic lesion in the mid-LAD successfully treated with 3.5x60mm Synergy DES. Otherwise, moderate nonobstructive disease (up to 50% prox OM2); normal LVEDP. He tolerated the procedure well and remains in stable condition.   Past Medical History   Past Medical History:  Diagnosis Date  . Arthritis   . Bullous emphysema (HCC)    Severe Bilateral  . Cocaine abuse ([place])   . Colitis    with bleeding  . COPD (chronic obstructive pulmonary disease) ([place])   . Dyspnea   . GERD (gastroesophageal reflux disease)   . Headache    sinusitis  . Hypertension   . LBP (low back pain)    now chronic  . Lumbago   . Lung mass    Right Upper Lobe pleural based mass, negative  on PET Scan  . Neck pain    nerve pain   . Pneumonia    hx  . Spine pain    nerve  . Therapeutic drug monitoring   . Tobacco abuse     Past Surgical History:  Procedure Laterality Date  . COLONOSCOPY    . LUMBAR LAMINECTOMY/DECOMPRESSION MICRODISCECTOMY Right 05/03/2016   Procedure: MICRODISCECTOMY LUMBAR FIVE- SACRAL ONE RIGHT;  Surgeon: Peter Pall, MD;  Location: [facility];  Service: Neurosurgery;  Laterality: Right;  . NO PAST SURGERIES       Allergies Allergies  Allergen Reactions  . Penicillins Anaphylaxis and Hives  Has patient had a PCN reaction causing immediate rash, facial/tongue/throat swelling, SOB or lightheadedness with hypotension: No Has patient had a PCN reaction causing severe rash involving mucus membranes or skin necrosis: No Has patient had a PCN reaction that required hospitalization No Has patient had a PCN reaction occurring within the last 10 years: No If all of the above answers are "NO", then may proceed with Cephalosporin use.      Home Medications    Prior to Admission medications   Medication Sig Start Date End Date Taking? Authorizing Provider  albuterol (PROVENTIL HFA;VENTOLIN HFA) 108 (90  BASE) MCG/ACT inhaler Inhale 2 puffs into the lungs every 6 (six) hours as needed for wheezing or shortness of breath.    Yes [provider]  atorvastatin (LIPITOR) 20 MG tablet Take 1 tablet (20 mg total) by mouth daily. 09/25/19  Yes Peter Chard, MD  buPROPion (WELLBUTRIN XL) 150 MG 24 hr tablet Take 1 tablet (150 mg total) by mouth daily. Patient taking differently: Take 150 mg by mouth daily as needed (feeling down).  09/23/19  Yes Peter Chard, MD  Carboxymethylcellul-Glycerin (CLEAR EYES FOR DRY EYES OP) Apply 1 drop to eye daily as needed (for dry eyes).   Yes [provider]  cyclobenzaprine (FLEXERIL) 10 MG tablet TAKE 1 TABLET (10 MG TOTAL) BY MOUTH AS NEEDED FOR MUSCLE SPASMS. Patient taking differently: Take 10 mg by mouth daily as needed for muscle spasms.  12/26/19  Yes Peter Chard, MD  diclofenac Sodium (VOLTAREN) 1 % GEL Apply 2 g topically 2 (two) times daily.   Yes [provider]  Fluticasone-Salmeterol (ADVAIR DISKUS) 250-50 MCG/DOSE AEPB Inhale 1 puff into the lungs 2 (two) times daily. 06/19/19  Yes Peter Chard, MD  ipratropium-albuterol (DUONEB) 0.5-2.5 (3) MG/3ML SOLN INHALE 1 VIAL (3MLS) USING NEBULIZER 4 TIMES PER DAY Patient taking differently: Inhale 3 mLs into the lungs every 6 (six) hours as needed (shortness of breath).  07/11/18  Yes Peter Chard, MD  linaclotide [facility]) 72 MCG capsule Take 1 capsule (72 mcg total) by mouth daily before breakfast. Patient taking differently: Take 72 mcg by mouth daily as needed (constipation).  04/28/19  Yes Peter Chard, MD  losartan (COZAAR) 50 MG tablet TAKE 1 TABLET BY MOUTH DAILY Patient taking differently: Take 50 mg by mouth daily.  03/03/19  Yes Peter Chard, MD  Magnesium 400 MG CAPS Take 400 mg by mouth daily. 06/05/18  Yes Peter Chard, MD  montelukast (SINGULAIR) 10 MG tablet Take 10 mg by mouth at bedtime.  09/26/16  Yes [provider]  omeprazole (PRILOSEC) 40 MG capsule  TAKE 1 CAPSULE BY MOUTH EVERY DAY BEFORE A MEAL Patient taking differently: Take 40 mg by mouth daily.  11/05/19  Yes Peter Chard, MD  pregabalin (LYRICA) 50 MG capsule TAKE 1 CAPSULE BY MOUTH TWICE DAILY Patient taking differently: Take 50 mg by mouth 2 (two) times daily.  08/07/19  Yes Peter Chard, MD  tamsulosin (FLOMAX) 0.4 MG CAPS capsule Take 0.4 mg by mouth daily. 12/08/19  Yes [provider]  Budeson-Glycopyrrol-Formoterol (BREZTRI AEROSPHERE) 160-9-4.8 MCG/ACT AERO Inhale 1 puff into the lungs 2 (two) times daily.     [provider]  metoprolol tartrate (LOPRESSOR) 100 MG tablet TAKE 1 TABLET 2 HOURS PRIOR TO CT Patient not taking: Reported on 01/13/2020 01/07/20   Peter Latch, MD  Respiratory Therapy Supplies (FLUTTER) DEVI Use as directed 02/02/17   Tanda Rockers, MD    Family History  Family History  Adopted: Yes  Problem Relation Age of Onset  . Cancer Father   . Lung cancer Father   . Heart failure Sister   . Breast cancer Sister   . Emphysema Mother    is adopted.    Social History    Social History   Socioeconomic History  . Marital status: Married    Spouse name: Not on file  . Number of children: 4  . Years of education: Not on file  . Highest education level: Not on file  Occupational History  . Occupation: Merchandiser, retail: UNEMPLOYED    Comment: Previous floor maintenance   Tobacco Use  . Smoking status: Current Every Day Smoker    Packs/day: 0.50    Years: 45.00    Pack years: 22.50    Types: Cigarettes  . Smokeless tobacco: Never Used  . Tobacco comment: 2ppd x15 years, then 1/2ppd, now 1ppd x 3  Vaping Use  . Vaping Use: Never used  Substance and Sexual Activity  . Alcohol use: Yes    Comment: scotch once a week  . Drug use: Yes    Types: Cocaine, Marijuana    Comment: crack cocaine quit 2003  . Sexual activity: Not on file  Other Topics Concern  . Not on file  Social History Narrative  . Not on  file   Social Determinants of Health   Financial Resource Strain:   . Difficulty of Paying Living Expenses: Not on file  Food Insecurity:   . Worried About Charity fundraiser in the Last Year: Not on file  . Ran Out of Food in the Last Year: Not on file  Transportation Needs:   . Lack of Transportation (Medical): Not on file  . Lack of Transportation (Non-Medical): Not on file  Physical Activity:   . Days of Exercise per Week: Not on file  . Minutes of Exercise per Session: Not on file  Stress:   . Feeling of Stress : Not on file  Social Connections:   . Frequency of Communication with Friends and Family: Not on file  . Frequency of Social Gatherings with Friends and Family: Not on file  . Attends Religious Services: Not on file  . Active Member of Clubs or Organizations: Not on file  . Attends Archivist Meetings: Not on file  . Marital Status: Not on file  Intimate Partner Violence:   . Fear of Current or Ex-Partner: Not on file  . Emotionally Abused: Not on file  . Physically Abused: Not on file  . Sexually Abused: Not on file     Review of Systems    A comprehensive ROS was performed with pertinent positives and negatives noted in the HPI.  Physical Exam    BP 111/85   Pulse 70   Temp 97.8 F (36.6 C) (Oral)   Resp (!) 22   Ht 6' (1.829 m)   Wt 87.2 kg   SpO2 99%   BMI 26.07 kg/m  General: Alert, NAD HEENT: Normal  Neck: No bruits or JVD. Lungs:  Resp regular and unlabored, diffuse expiratory wheezing bilaterally Heart: Regular rhythm, no s3, s4, or murmurs. Abdomen: Soft, non-tender, non-distended, BS +.  Extremities: Warm. No clubbing, cyanosis or edema. DP/PT/Radials 2+ and equal bilaterally. Psych: Normal affect. Neuro: Alert and oriented. No gross focal deficits. No abnormal movements.  Labs    Troponin (Point of Care Test) No results for input(s): TROPIPOC in the last 72 hours.  No results for input(s): CKTOTAL, CKMB, TROPONINI in the  last 72 hours. Lab Results  Component Value Date   WBC 10.7 (H) 01/14/2020   HGB 16.3 01/14/2020   HCT 50.9 01/14/2020   MCV 91.4 01/14/2020   PLT 241 01/14/2020    Recent Labs  Lab 01/13/20 1932  NA 138  K 3.7  CL 102  CO2 28  BUN 11  CREATININE 1.13  CALCIUM 9.4  PROT 6.6  BILITOT 0.6  ALKPHOS 86  ALT 28  AST 19  GLUCOSE 134*   Lab Results  Component Value Date   CHOL 164 01/14/2020   HDL 61 01/14/2020   LDLCALC 91 01/14/2020   TRIG 62 01/14/2020   No results found for: [facility]   Radiology Studies    XR Chest Portable 1 View Result Date: 01/13/2020 FINDINGS: The heart size and mediastinal contours are within normal limits. Both lungs are clear. The visualized skeletal structures are unremarkable. IMPRESSION: No active disease.    ECG & Cardiac Imaging    ECGs, personally reviewed: 01/13/20 20:03: sinus rhythm with occasional PAC, nonspecific ST-T wave changes  01/14/20 13:14: sinus rhythm with possible fusion complexes, marked anterior and lateral ST elevation with more subtle reciprocal changes, nonspecific IVCD  Assessment & Plan    Anterior STEMI: recurrent chest pain over the last 24 hours with initial presentation consistent with NSTEMI progressing to anterior STEMI while in the ED. Successful PCI to mid-LAD culprit. Moderate nonobstructive residual disease. LVEDP normal, Killip class I. Stable and chest pain free. The patient's TIMI risk score is 4, which indicates a 7.3% risk of all cause mortality at 30 days. He continues to smoke. LDL is 91 with goal at least < 70. - TTE in the morning - At least 1 year of DAPT with aspirin and ticagrelor - Increase atorvastatin to 80mg  - Continue metoprolol and losartan - SL nitro and ECG prn - Cardiac rehab referral  COPD: currently with significant wheezing but no other evidence of exacerbation. Recently completed a course of prednisone. Continues to smoke. - continue home regimen (Singulair, Dulera substituted  for Advair) - Duoneb prn - consider pulmonology consultation  Tobacco use: previous attempt unsuccessful. Ready to quit. Did not tolerate Chantix previously. - Smoking cessation strategies were discussed - Provided with nicotine patch - Smoking cessation referral  Type 2 DM: last A1c was < 6.5% and he is not currently on medication. - Monitor glucose, currently wnl - SSI prn - On ARB and statin  Nutrition: resume heart healthy diet DVT ppx: SQ heparin Advanced Care Planning: full code   Signed, Marykay Lex, MD 01/14/2020, 2:56 AM

## 2020-01-14 NOTE — ED Notes (Signed)
Cardiology at bedside at this time

## 2020-01-14 NOTE — ED Notes (Addendum)
Pt connected to defibrillator pads at this time.

## 2020-01-14 NOTE — ED Provider Notes (Signed)
I was called to room due to chest pain and abnormal EKG EKG shows new anterolateral STEMI Code STEMI called Pt awake/alert, reports CP   EKG Interpretation  Date/Time:  Wednesday January 14 2020 01:15:29 EST Ventricular Rate:  118 PR Interval:    QRS Duration: 124 QT Interval:  320 QTC Calculation: 449 R Axis:   60 Text Interpretation: Sinus tachycardia Left bundle branch block ** ** ACUTE MI / STEMI ** ** Confirmed by Ripley Fraise [id number]) on 01/14/2020 1:28:23 AM      D/w dr Ronalee Belts cooper on for STEMI call He will take patient to cath lab    Ripley Fraise, MD 01/14/20 0133

## 2020-01-14 NOTE — ED Notes (Signed)
Activated Code Stemi--Peter Sims 

## 2020-01-14 NOTE — Progress Notes (Signed)
CARDIAC REHAB PHASE I   PRE:  Rate/Rhythm: 66 SR with many PACs    BP: sitting 133/85    SaO2: 100 RA  MODE:  Ambulation: 270 ft   POST:  Rate/Rhythm: 82 SR with PACs    BP: sitting 160/101     SaO2: 91 RA  Pt ambulated independently. C/o slight chest tightness at halfway, seemed to resolve with rest. BP elevated. To recliner after walk. Discussed MI, stent, Brilinta importance, smoking cessation and restrictions. Pt receptive. He wants to quit smoking but unsure of cold [place]. Gave him materials and we will discuss more. Also gave him diet to begin reviewing. [place], ACSM 01/14/2020 12:17 PM

## 2020-01-14 NOTE — Progress Notes (Signed)
   01/14/20 0135  Clinical Encounter Type  Visited With Patient not available  Visit Type Trauma  Referral From Nurse  Consult/Referral To Chaplain  The chaplain responded to code stemi. Patient is being transported to the Cath lab. The chaplain will follow up as needed.

## 2020-01-14 NOTE — ED Notes (Signed)
Pt's spouse is at bedside.

## 2020-01-15 ENCOUNTER — Telehealth: Payer: Self-pay | Admitting: Cardiovascular Disease

## 2020-01-15 ENCOUNTER — Encounter (HOSPITAL_COMMUNITY): Payer: Self-pay | Admitting: Cardiovascular Disease

## 2020-01-15 ENCOUNTER — Telehealth: Payer: Self-pay

## 2020-01-15 ENCOUNTER — Other Ambulatory Visit (HOSPITAL_COMMUNITY): Payer: Self-pay | Admitting: Cardiology

## 2020-01-15 DIAGNOSIS — I214 Non-ST elevation (NSTEMI) myocardial infarction: Secondary | ICD-10-CM

## 2020-01-15 DIAGNOSIS — I10 Essential (primary) hypertension: Secondary | ICD-10-CM

## 2020-01-15 DIAGNOSIS — Z95820 Peripheral vascular angioplasty status with implants and grafts: Secondary | ICD-10-CM

## 2020-01-15 DIAGNOSIS — Z720 Tobacco use: Secondary | ICD-10-CM

## 2020-01-15 HISTORY — DX: Peripheral vascular angioplasty status with implants and grafts: Z95.820

## 2020-01-15 LAB — CBC
HCT: 49.4 % (ref 39.0–52.0)
Hemoglobin: 16.5 g/dL (ref 13.0–17.0)
MCH: 29.2 pg (ref 26.0–34.0)
MCHC: 33.4 g/dL (ref 30.0–36.0)
MCV: 87.4 fL (ref 80.0–100.0)
Platelets: 237 10*3/uL (ref 150–400)
RBC: 5.65 MIL/uL (ref 4.22–5.81)
RDW: 15.5 % (ref 11.5–15.5)
WBC: 9.2 10*3/uL (ref 4.0–10.5)
nRBC: 0 % (ref 0.0–0.2)

## 2020-01-15 MED ORDER — MOMETASONE FURO-FORMOTEROL FUM 200-5 MCG/ACT IN AERO: 2.0000 | INHALATION_SPRAY | Freq: Two times a day (BID) | RESPIRATORY_TRACT | 3 refills | Status: DC

## 2020-01-15 MED ORDER — ACETAMINOPHEN 325 MG PO TABS: 650.0000 mg | ORAL_TABLET | ORAL | Status: AC | PRN

## 2020-01-15 MED ORDER — NITROGLYCERIN 0.4 MG SL SUBL: 0.4000 mg | SUBLINGUAL_TABLET | SUBLINGUAL | 4 refills | Status: DC | PRN

## 2020-01-15 MED ORDER — TICAGRELOR 90 MG PO TABS: 90.0000 mg | ORAL_TABLET | Freq: Two times a day (BID) | ORAL | 11 refills | Status: DC

## 2020-01-15 MED ORDER — ASPIRIN 81 MG PO CHEW: 81.0000 mg | CHEWABLE_TABLET | Freq: Every day | ORAL | Status: AC

## 2020-01-15 MED ORDER — LOSARTAN POTASSIUM 50 MG PO TABS
50.0000 mg | ORAL_TABLET | Freq: Every day | ORAL | Status: DC
  Administered 2020-01-15: 50 mg via ORAL
  Filled 2020-01-15: qty 1

## 2020-01-15 MED ORDER — METOPROLOL TARTRATE 25 MG PO TABS: 12.5000 mg | ORAL_TABLET | Freq: Two times a day (BID) | ORAL | 6 refills | Status: DC

## 2020-01-15 MED ORDER — ATORVASTATIN CALCIUM 80 MG PO TABS: 80.0000 mg | ORAL_TABLET | Freq: Every day | ORAL | 6 refills | Status: DC

## 2020-01-15 MED ORDER — NICOTINE 7 MG/24HR TD PT24: 7.0000 mg | MEDICATED_PATCH | Freq: Every day | TRANSDERMAL | 0 refills | Status: DC

## 2020-01-15 MED FILL — BRILINTA 90 MG TABLET: 90 | 30 days supply | Qty: 60 | Fill #0

## 2020-01-15 MED FILL — ATORVASTATIN 80 MG TABLET: 80 | 30 days supply | Qty: 30 | Fill #0

## 2020-01-15 MED FILL — METOPROLOL TARTRATE 25 MG T: 25 | 30 days supply | Qty: 30 | Fill #0

## 2020-01-15 MED FILL — DULERA 200 MCG/5 MCG INH: 200-5 | 30 days supply | Qty: 13 | Fill #0

## 2020-01-15 MED FILL — NICOTINE 7 MG/24HR PATCH: 7 | 28 days supply | Qty: 28 | Fill #0

## 2020-01-15 MED FILL — NITROGLYCERIN 0.4 MG TAB SL: 0.4 | 5 days supply | Qty: 25 | Fill #0

## 2020-01-15 NOTE — Discharge Summary (Signed)
Discharge Summary    [name] MRN: [id number]; DOB: [birth year]  Admit date: 01/13/2020 Discharge date: 01/15/2020  Primary Care Provider: Glendale Chard, MD  Primary Cardiologist: Skeet Latch, MD  Primary Electrophysiologist:  None   Discharge Diagnoses    Principal Problem:   ST elevation myocardial infarction involving left anterior descending (LAD) coronary artery [facility]) Active Problems:   Essential hypertension, benign   Hyperlipidemia   Tobacco abuse counseling   Coronary atherosclerosis due to lipid rich plaque   COPD with chronic bronchitis ([place])   S/P angioplasty with stent 01/13/20 emergent DES to mLAD      Diagnostic Studies/Procedures    Cardiac cath emergent 01/14/20 .  Severe mid LAD stenosis (culprit lesion) treated successfully with PCI using a 3.5 x 16 mm Synergy DES 2.  Mild nonobstructive proximal LAD, mid circumflex, OM 2, and proximal RCA stenoses 3.  Normal LV systolic function with normal LVEDP  Recommendations: Aggressive medical therapy for secondary risk reduction.  Tobacco cessation.  The patient is loaded with ticagrelor 180 mg in the Cath Lab.  He is started on a high intensity statin drug and a beta-blocker.  Discussed cardiac catheterization findings with patient and his wife.  If no complications arise, should be eligible for hospital discharge within 24 hours.  __The left ventricular size is normal. The left ventricular systolic function is normal. LV end diastolic pressure is normal. The left ventricular ejection fraction is greater than 65% by visual estimate. No regional wall motion abnormalities.___________  Diagnostic Dominance: Right  Intervention      History of Present Illness     Peter Sims is a 69 y.o. male with hx of HTN, HLD, tobacco use borderline DM-2 COPD and GERD presented to ER 01/13/20 at 7 pm for intermittent chest pain.  He had had pressure off and on for weeks and had been seen in  the office with plans for cardiac CTA.  By time of this admit he had not yet had the CTA and his pain increased.   With the pain he responded to SL NTG.  Initial EKG was stable with only ST-T wave changes. Troponin was 61.  He was placed on IV heparin and with another episode of pain he developed ST elevation in ant. Lateral leads and CODE STEMI was called.  He was taken emergently to the cath lab.   He had high-grade thrombotic lesion in the mid-LAD successfully treated with 3.5x34mm Synergy DES. Otherwise, moderate nonobstructive disease (up to 50% prox OM2); normal LVEDP. He tolerated the procedure well. Admitted to ICU.     Hospital Course     Consultants: none   Pt has done well since admit.  BP was low so ARB was reduced but now at discharge back up so cozaar at outpt dose.  LDL 91 so Lipitor increased to 80 mg, pt is on nicoderm for smoking cessation.   He has walked with cardiac rehab and we recommend phase 2 rehab.  His EF is normal.  On cath he has residual CAD mild that will be treated medically.  pk troponin 106.    Pt was seen and evaluated by Dr. Oval Linsey and found stable for discharge. BP 131/95 prior to discharge.  Did the patient have an acute coronary syndrome (MI, NSTEMI, STEMI, etc) this admission?:  Yes  AHA/ACC Clinical Performance & Quality Measures: 1. Aspirin prescribed? - Yes 2. ADP Receptor Inhibitor (Plavix/Clopidogrel, Brilinta/Ticagrelor or Effient/Prasugrel) prescribed (includes medically managed patients)? - Yes 3. Beta Blocker prescribed? - Yes 4. High Intensity Statin (Lipitor 40-80mg  or Crestor 20-40mg ) prescribed? - Yes 5. EF assessed during THIS hospitalization? - Yes 6. For EF <40%, was ACEI/ARB prescribed? - Not Applicable (EF >/= 47%) 7. For EF <40%, Aldosterone Antagonist (Spironolactone or Eplerenone) prescribed? - Not Applicable (EF >/= 09%) 8. Cardiac Rehab Phase II ordered (including medically managed patients)? - Yes         _____________  Discharge Vitals Blood pressure (!) 150/112, pulse 75, temperature 97.7 F (36.5 C), temperature source Oral, resp. rate 20, height 6' (1.829 m), weight 86.8 kg, SpO2 95 %.  Filed Weights   01/13/20 2200 01/14/20 0330  Weight: 87.2 kg 86.8 kg    Labs & Radiologic Studies    CBC Recent Labs    01/14/20 0339 01/15/20 0536  WBC 9.1 9.2  HGB 14.6 16.5  HCT 45.8 49.4  MCV 89.1 87.4  PLT 212 628   Basic Metabolic Panel Recent Labs    01/13/20 1932 01/14/20 0339  NA 138 138  K 3.7 3.9  CL 102 107  CO2 28 23  GLUCOSE 134* 142*  BUN 11 10  CREATININE 1.13 0.98  CALCIUM 9.4 9.0   Liver Function Tests Recent Labs    01/13/20 1932  AST 19  ALT 28  ALKPHOS 86  BILITOT 0.6  PROT 6.6  ALBUMIN 3.4*   Recent Labs    01/13/20 1932  LIPASE 31   High Sensitivity Troponin:   Recent Labs  Lab 12/18/19 1543 01/13/20 1932 01/13/20 2130 01/14/20 0128 01/14/20 0339  TROPONINIHS 7 61* 82* 83* 106*    BNP Invalid input(s): POCBNP D-Dimer No results for input(s): DDIMER in the last 72 hours. Hemoglobin A1C No results for input(s): HGBA1C in the last 72 hours. Fasting Lipid Panel Recent Labs    01/14/20 0128  CHOL 164  HDL 61  LDLCALC 91  TRIG 62  CHOLHDL 2.7   Thyroid Function Tests No results for input(s): TSH, T4TOTAL, T3FREE, THYROIDAB in the last 72 hours.  Invalid input(s): FREET3 _____________  DG Chest 2 View  Result Date: 12/18/2019 CLINICAL DATA:  Chest pain EXAM: CHEST - 2 VIEW COMPARISON:  01/02/2018 FINDINGS: The heart size and mediastinal contours are within normal limits. Emphysematous changes within the upper lobes including medial left apical bulla. 9 mm nodular density projecting within the right lung base. No pleural effusion or pneumothorax. The visualized skeletal structures are unremarkable. IMPRESSION: 1. There is a 9 mm nodular density projecting over the right lung base. Repeat study with nipple markers is  suggested for further evaluation. 2. Emphysema. Electronically Signed   By: Davina Poke D.O.   On: 12/18/2019 11:39   CT Head Wo Contrast  Result Date: 12/18/2019 CLINICAL DATA:  TIA. EXAM: CT HEAD WITHOUT CONTRAST TECHNIQUE: Contiguous axial images were obtained from the base of the skull through the vertex without intravenous contrast. COMPARISON:  04/19/2016 FINDINGS: Brain: There is no evidence of an acute infarct, intracranial hemorrhage, mass, midline shift, or extra-axial fluid collection. The ventricles and sulci are normal. Vascular: Calcified atherosclerosis at the skull base. No hyperdense vessel. Skull: No acute fracture or suspicious osseous lesion. Sinuses/Orbits: Chronic depression of the left lamina papyracea. Chronic opacification of multiple right ethmoid air cells, slightly progressed from prior. Mild left frontal sinus mucosal thickening. Clear mastoid air  cells. Other: None. IMPRESSION: No evidence of acute intracranial abnormality. Electronically Signed   By: Logan Bores M.D.   On: 12/18/2019 13:39   MR Cervical Spine Wo Contrast  Result Date: 01/01/2020 CLINICAL DATA:  Chronic neck pain EXAM: MRI CERVICAL SPINE WITHOUT CONTRAST TECHNIQUE: Multiplanar, multisequence MR imaging of the cervical spine was performed. No intravenous contrast was administered. COMPARISON:  None. FINDINGS: Alignment: There is slight reversal of the normal cervical lordosis. Vertebrae: The vertebral body heights are well maintained. No fracture, marrow edema,or pathologic marrow infiltration. Cord: Normal signal and morphology. Posterior Fossa, vertebral arteries, paraspinal tissues: The visualized portion of the posterior fossa is unremarkable. Normal flow voids seen within the vertebral arteries. The paraspinal soft tissues are unremarkable. Disc levels: C1-C2: Atlanto-axial junction is normal, without canal narrowing C2-C3: There is a minimal disc osteophyte complex and uncovertebral osteophytes which  causes moderate bilateral neural foraminal narrowing. C3-C4: There is a disc osteophyte complex and uncovertebral osteophytes which causes severe left and moderate right neural foraminal narrowing as well as mild central canal stenosis. C4-C5: There is a disc osteophyte complex and uncovertebral osteophytes which causes severe bilateral foraminal narrowing and moderate central canal stenosis. C5-C6: There is a disc osteophyte complex and uncovertebral osteophytes which causes severe right and moderate to severe left neural foraminal narrowing and mild effacement anterior thecal sac. C6-C7: There is a disc osteophyte complex and uncovertebral osteophytes which causes severe bilateral neural foraminal narrowing and mild effacement anterior thecal sac. C7-T1: There is a disc osteophyte complex and uncovertebral osteophytes which causes severe bilateral neural foraminal narrowing and moderate central canal stenosis. IMPRESSION: Cervical spine spondylosis most notable at C4-C5 and C7-T1 with severe bilateral neural foraminal narrowing and moderate central canal stenosis. Electronically Signed   By: Prudencio Pair M.D.   On: 01/01/2020 22:26   CARDIAC CATHETERIZATION  Result Date: 01/14/2020 1.  Severe mid LAD stenosis (culprit lesion) treated successfully with PCI using a 3.5 x 16 mm Synergy DES 2.  Mild nonobstructive proximal LAD, mid circumflex, OM 2, and proximal RCA stenoses 3.  Normal LV systolic function with normal LVEDP Recommendations: Aggressive medical therapy for secondary risk reduction.  Tobacco cessation.  The patient is loaded with ticagrelor 180 mg in the Cath Lab.  He is started on a high intensity statin drug and a beta-blocker.  Discussed cardiac catheterization findings with patient and his wife.  If no complications arise, should be eligible for hospital discharge within 24 hours.  DG Chest Portable 1 View  Result Date: 01/13/2020 CLINICAL DATA:  chest pain EXAM: PORTABLE CHEST 1 VIEW  COMPARISON:  December 18, 2019 FINDINGS: The heart size and mediastinal contours are within normal limits. Both lungs are clear. The visualized skeletal structures are unremarkable. IMPRESSION: No active disease. Electronically Signed   By: Prudencio Pair M.D.   On: 01/13/2020 20:06   CT Angio Chest/Abd/Pel for Dissection W and/or Wo Contrast  Result Date: 12/18/2019 CLINICAL DATA:  Chest and abdominal pain EXAM: CT ANGIOGRAPHY CHEST, ABDOMEN AND PELVIS TECHNIQUE: Non-contrast CT of the chest was initially obtained. Multidetector CT imaging through the chest, abdomen and pelvis was performed using the standard protocol during bolus administration of intravenous contrast. Multiplanar reconstructed images and MIPs were obtained and reviewed to evaluate the vascular anatomy. CONTRAST:  79mL OMNIPAQUE IOHEXOL 350 MG/ML SOLN COMPARISON:  Chest CT February 02, 2018; chest radiograph December 18, 2019; prior chest CT Jul 27, 2014 FINDINGS: CTA CHEST FINDINGS Cardiovascular: There is no intramural hematoma evident on noncontrast  enhanced study. There is no evident intramural hematoma. No thoracic aortic aneurysm or dissection. Visualized great vessels appear normal. There is mild aortic atherosclerosis. There are occasional foci of coronary artery calcification. There is no pericardial effusion or pericardial thickening. Mediastinum/Nodes: Thyroid appears normal. There is no appreciable thoracic adenopathy. There is a small hiatal hernia. Lungs/Pleura: There is underlying centrilobular and paraseptal emphysematous change. There is a stable lentiform shaped opacity abutting the pleura in the lateral aspect of the left upper lobe, measuring 5.5 x 1.1 cm, stable compared to the prior study from 2019 and smaller than on the 2016 study. No similar areas of localized opacity abutting the pleura. Note that there are prominent bullae in the apices, stable. There is no edema or airspace opacity. No pleural effusions are evident.  Musculoskeletal: No blastic or lytic bone lesions. No evident chest wall lesions. Review of the MIP images confirms the above findings. CTA ABDOMEN AND PELVIS FINDINGS VASCULAR Aorta: There is no evident abdominal aortic aneurysm or dissection. There is slight atherosclerotic plaque in the distal aorta without evidence suggesting hemodynamically significant obstruction. Celiac: Celiac artery and its branches are widely patent without aneurysm or dissection evident. SMA: Superior mesenteric artery and its branches are widely patent. No evident aneurysm or dissection involving these vessels. Renals: There is a single renal artery on each side. Renal arteries and their respective branches are widely patent. No aneurysm or dissection involving these vessels. No demonstrable fibromuscular dysplasia on either side. IMA: The inferior mesenteric artery and its branches are widely patent without appreciable aneurysm or dissection involving these vessels. Inflow: No aneurysm or dissection noted involving the pelvic arterial vessels. There is minimal calcification in the distal right common iliac artery. No hemodynamically significant obstruction involving pelvic arterial vessels. There is slight calcification in the proximal superficial femoral arteries bilaterally without appreciable hemodynamically significant obstruction. No aneurysm or dissection proximal superficial and profunda femoral arteries. Veins: No obvious venous abnormality within the limitations of this arterial phase study. Review of the MIP images confirms the above findings. NON-VASCULAR Hepatobiliary: There there is an apparent degree of hepatic steatosis. No focal liver lesions are appreciable. The gallbladder wall is not appreciably thickened. There is no biliary duct dilatation. Pancreas: No pancreatic mass or inflammatory focus. Spleen: No splenic lesions are evident. Adrenals/Urinary Tract: Adrenals bilaterally appear normal. Kidneys bilaterally show  no evident mass or hydronephrosis on either side. There is no renal or ureteral calculus on either side. The urinary bladder is midline. There is thickening of the wall of the urinary bladder. Stomach/Bowel: There are occasional sigmoid diverticula without diverticulitis. Occasional scattered diverticula elsewhere in the colon without diverticulitis. There is no appreciable bowel wall or mesenteric thickening. The terminal ileum appears normal. No evident bowel obstruction. No free air or portal venous air. Lymphatic: No lymph node enlargement evident in the abdomen or pelvis. Reproductive: Occasional prostatic calculi. Prostate and seminal vesicles appear normal in size and configuration. Other: Appendix appears unremarkable. No evident abscess or ascites in the abdomen or pelvis. Musculoskeletal: There is degenerative change in the lumbar spine. No blastic or lytic bone lesions are appreciable. No appreciable intramuscular or abdominal wall lesions. Review of the MIP images confirms the above findings. IMPRESSION: CT angiogram chest: 1. No thoracic aortic aneurysm or dissection. Mild aortic atherosclerosis as well as foci of coronary artery calcification noted. 2. No pulmonary embolus evident. 3. Underlying emphysematous change. 4. Stable lentiform shaped opacity abutting the pleura in the periphery of the right upper lobe. Stability over  time indicative of benign etiology. 5. Small hiatal hernia. 6. No evident thoracic adenopathy. CT angiogram abdomen; CT angiogram pelvis: 1. No aneurysm or dissection involving the aorta, major mesenteric, and major pelvic arterial vessels. Mild atherosclerotic plaque in the distal aorta. No hemodynamically obstructing lesions evident involving the arterial vessels in the abdomen and pelvis. No fibromuscular dysplasia. 2. Scattered colonic diverticula without diverticulitis. No bowel obstruction. No abscess in the abdomen or pelvis. Appendix appears normal. 3. Urinary bladder  wall thickening consistent with a degree of cystitis. 4.  No renal or ureteral calculi.  No hydronephrosis. Electronically Signed   By: Lowella Grip III M.D.   On: 12/18/2019 15:37   Disposition   Pt is being discharged home today in good condition.  Follow-up Plans & Appointments   Call [facility] at [phone] if any bleeding, swelling or drainage at cath site.  May shower, no tub baths for 48 hours for groin sticks. No lifting over 5 pounds for 5 days.  No Driving for 5 days  Take 1 NTG, under your tongue, while sitting.  If no relief of pain may repeat NTG, one tab every 5 minutes up to 3 tablets total over 15 minutes.  If no relief CALL 911.  If you have dizziness/lightheadness  while taking NTG, stop taking and call 911.         Heart Healthy Diet   No strenous activity until seen in the office.    Do NOT stop Brilinta and asprin they keep the new stent in your coronary artery from clotting.      Follow-up Information    Skeet Latch, MD Follow up on 01/20/2020.   Specialty: Cardiology Why: at 3:20 pm Contact information: [address] [place] [address] [phone]              Discharge Instructions    Amb Referral to Cardiac Rehabilitation   Complete by: As directed    Diagnosis:  STEMI PTCA Coronary Stents     After initial evaluation and assessments completed: Virtual Based Care may be provided alone or in conjunction with Phase 2 Cardiac Rehab based on patient barriers.: Yes      Discharge Medications   Allergies as of 01/15/2020      Reactions   Penicillins Anaphylaxis, Hives   Has patient had a PCN reaction causing immediate rash, facial/tongue/throat swelling, SOB or lightheadedness with hypotension: No Has patient had a PCN reaction causing severe rash involving mucus membranes or skin necrosis: No Has patient had a PCN reaction that required hospitalization No Has patient had a PCN reaction  occurring within the last 10 years: No If all of the above answers are "NO", then may proceed with Cephalosporin use.      Medication List    STOP taking these medications   Fluticasone-Salmeterol 250-50 MCG/DOSE Aepb Commonly known as: Advair Diskus Replaced by: mometasone-formoterol 200-5 MCG/ACT Aero     TAKE these medications   acetaminophen 325 MG tablet Commonly known as: TYLENOL Take 2 tablets (650 mg total) by mouth every 4 (four) hours as needed for headache or mild pain.   albuterol 108 (90 Base) MCG/ACT inhaler Commonly known as: VENTOLIN HFA Inhale 2 puffs into the lungs every 6 (six) hours as needed for wheezing or shortness of breath.   aspirin 81 MG chewable tablet Chew 1 tablet (81 mg total) by mouth daily. Start taking on: January 16, 2020   atorvastatin 80 MG tablet Commonly known  as: LIPITOR Take 1 tablet (80 mg total) by mouth daily. Start taking on: January 16, 2020 What changed:   medication strength  how much to take   Breztri Aerosphere 160-9-4.8 MCG/ACT Aero Generic drug: Budeson-Glycopyrrol-Formoterol Inhale 1 puff into the lungs 2 (two) times daily.   buPROPion 150 MG 24 hr tablet Commonly known as: WELLBUTRIN XL Take 1 tablet (150 mg total) by mouth daily. What changed:   when to take this  reasons to take this   CLEAR EYES FOR DRY EYES OP Apply 1 drop to eye daily as needed (for dry eyes).   cyclobenzaprine 10 MG tablet Commonly known as: FLEXERIL TAKE 1 TABLET (10 MG TOTAL) BY MOUTH AS NEEDED FOR MUSCLE SPASMS. What changed: when to take this   Flutter Devi Use as directed   ipratropium-albuterol 0.5-2.5 (3) MG/3ML Soln Commonly known as: DUONEB INHALE 1 VIAL (3MLS) USING NEBULIZER 4 TIMES PER DAY What changed: See the new instructions.   linaclotide 72 MCG capsule Commonly known as: LINZESS Take 1 capsule (72 mcg total) by mouth daily before breakfast. What changed:   when to take this  reasons to take this     losartan 50 MG tablet Commonly known as: COZAAR TAKE 1 TABLET BY MOUTH DAILY   Magnesium 400 MG Caps Take 400 mg by mouth daily.   metoprolol tartrate 25 MG tablet Commonly known as: LOPRESSOR Take 0.5 tablets (12.5 mg total) by mouth 2 (two) times daily. What changed:   medication strength  how much to take  how to take this  when to take this  additional instructions   mometasone-formoterol 200-5 MCG/ACT Aero Commonly known as: DULERA Inhale 2 puffs into the lungs 2 (two) times daily. Replaces: Fluticasone-Salmeterol 250-50 MCG/DOSE Aepb   montelukast 10 MG tablet Commonly known as: SINGULAIR Take 10 mg by mouth at bedtime.   nicotine 7 mg/24hr patch Commonly known as: NICODERM CQ - dosed in mg/24 hr Place 1 patch (7 mg total) onto the skin at bedtime.   nitroGLYCERIN 0.4 MG SL tablet Commonly known as: NITROSTAT Place 1 tablet (0.4 mg total) under the tongue every 5 (five) minutes as needed for chest pain.   omeprazole 40 MG capsule Commonly known as: PRILOSEC TAKE 1 CAPSULE BY MOUTH EVERY DAY BEFORE A MEAL What changed: See the new instructions.   pregabalin 50 MG capsule Commonly known as: LYRICA TAKE 1 CAPSULE BY MOUTH TWICE DAILY   tamsulosin 0.4 MG Caps capsule Commonly known as: FLOMAX Take 0.4 mg by mouth daily.   ticagrelor 90 MG Tabs tablet Commonly known as: BRILINTA Take 1 tablet (90 mg total) by mouth 2 (two) times daily.   Voltaren 1 % Gel Generic drug: diclofenac Sodium Apply 2 g topically 2 (two) times daily.          Outstanding Labs/Studies   Echo as outpt -consider  BMP  Hepatic and lipids in 6 weeks.   Duration of Discharge Encounter   Greater than 30 minutes including physician time.  Signed, Cecilie Kicks, NP 01/15/2020, 11:24 AM

## 2020-01-15 NOTE — Care Management (Signed)
Benefit's check placed for Brilinta 90 mg po BID for 30-day supply.

## 2020-01-15 NOTE — TOC Initial Note (Signed)
Transition of Care [facility]) - Initial/Assessment Note    Patient Details  Name: Peter Sims MRN: [id number] Date of Birth: 1950/12/17  Transition of Care [facility]) CM/SW Contact:    Curlene Labrum, RN Phone Number: 01/15/2020, 11:25 AM  Clinical Narrative:                 Case management met with the patient and wife at the bedside regarding transitions of care to home.  The patient is having his discharge medications filled through the [facility].  The wife was given a Brilinta medication assistance card although the pharmacy would handle the medication savings.  The patient is aware of smoking cessation and plans to follow up for medical care with Dr. Baird Cancer for PCP.  The patient's wife is going to take FMLA to care for the patient at home and will be driving the patient home by car today.  No other discharge needs were noted at this time.    Expected Discharge Plan: Home/Self Care Barriers to Discharge: No Barriers Identified   Patient Goals and CMS Choice Patient states their goals for this hospitalization and ongoing recovery are:: Patient plans to discharge with his wife today. CMS Medicare.gov Compare Post Acute Care list provided to:: Patient Choice offered to / list presented to : Patient  Expected Discharge Plan and Services Expected Discharge Plan: Home/Self Care   Discharge Planning Services: CM Consult   Living arrangements for the past 2 months: Single Family Home Expected Discharge Date: 01/15/20                                    Prior Living Arrangements/Services Living arrangements for the past 2 months: Single Family Home Lives with:: Spouse Patient language and need for interpreter reviewed:: Yes Do you feel safe going back to the place where you live?: Yes      Need for Family Participation in Patient Care: Yes (Comment) Care giver support system in place?: Yes (comment)   Criminal Activity/Legal Involvement  Pertinent to Current Situation/Hospitalization: No - Comment as needed  Activities of Daily Living Home Assistive Devices/Equipment: None ADL Screening (condition at time of admission) Patient's cognitive ability adequate to safely complete daily activities?: Yes Is the patient deaf or have difficulty hearing?: No Does the patient have difficulty seeing, even when wearing glasses/contacts?: No Does the patient have difficulty concentrating, remembering, or making decisions?: No Patient able to express need for assistance with ADLs?: No Does the patient have difficulty dressing or bathing?: No Independently performs ADLs?: Yes (appropriate for developmental age) Does the patient have difficulty walking or climbing stairs?: No Weakness of Legs: None Weakness of Arms/Hands: None  Permission Sought/Granted Permission sought to share information with : Case Manager Permission granted to share information with : Yes, Verbal Permission Granted     Permission granted to share info w AGENCY: [facility] for discharge medications  Permission granted to share info w Relationship: wife     Emotional Assessment Appearance:: Appears stated age Attitude/Demeanor/Rapport: Engaged Affect (typically observed): Accepting Orientation: : Oriented to Self, Oriented to Place, Oriented to  Time, Oriented to Situation Alcohol / Substance Use: Tobacco Use (Patient acknowledged need to quit smoking.) Psych Involvement: No (comment)  Admission diagnosis:  STEMI (ST elevation myocardial infarction) ([place]) [I21.3] Elevated troponin [R77.8] NSTEMI (non-ST elevated myocardial infarction) ([place]) [I21.4] ST elevation myocardial infarction (STEMI), unspecified artery ([place]) [I21.3] ST elevation  myocardial infarction involving left anterior descending (LAD) coronary artery ([place]) [I21.02] Patient Active Problem List   Diagnosis Date Noted  . S/P angioplasty with stent 01/13/20 emergent DES to mLAD   01/15/2020  . ST elevation myocardial infarction involving left anterior descending (LAD) coronary artery ([place]) 01/14/2020  . NSTEMI (non-ST elevated myocardial infarction) ([place]) 01/13/2020  . Aortic atherosclerosis ([place]) 12/02/2019  . Coronary atherosclerosis due to lipid rich plaque 12/02/2019  . COPD with chronic bronchitis ([place]) 12/02/2019  . Mucopurulent chronic bronchitis ([place]) 12/02/2019  . Constipation 03/05/2018  . Hyperlipidemia 03/05/2018  . Hypertensive chronic kidney disease with stage 1 through stage 4 chronic kidney disease, or unspecified chronic kidney disease 03/05/2018  . Tobacco abuse counseling 03/05/2018  . Cervical disc disorder at C4-C5 level with radiculopathy 06/18/2017  . Chronic cough 02/02/2017  . Essential hypertension, benign 11/21/2016  . HNP (herniated nucleus pulposus), lumbar 05/03/2016  . Bronchopneumonia 09/28/2011  . COPD with exacerbation ([place]) 09/28/2011  . Chest pain 09/28/2011  . Lumbago   . Cigarette smoker   . COPD GOLD 0/ ab with pseudowheeze component  03/07/2011  . Cocaine abuse ([place])   . GERD (gastroesophageal reflux disease)   . Bullous emphysema ([place])   . Lung mass    PCP:  Glendale Chard, MD Pharmacy:   [place], [place] - [address] [facility]. [address] [place] [address] Phone: [phone] Fax: [phone]     Social Determinants of Health (SDOH) Interventions    Readmission Risk Interventions Readmission Risk Prevention Plan 01/15/2020  Transportation Screening Complete  PCP or Specialist Appt within 5-7 Days Complete  Home Care Screening Complete  Medication Review (RN CM) Complete  Some recent data might be hidden

## 2020-01-15 NOTE — Progress Notes (Signed)
Progress Note  Patient Name: Peter Sims Date of Encounter: 01/15/2020  [facility] Cardiologist: Skeet Latch, MD   Subjective   Feeling well.  No further chest pain.  Ambulated without difficulty and feels better than he has in a long time.  Notes that his breathing is better on Dulera.  Inpatient Medications    Scheduled Meds: . aspirin  81 mg Oral Daily  . atorvastatin  80 mg Oral Daily  . buPROPion  150 mg Oral Daily  . Chlorhexidine Gluconate Cloth  6 each Topical Daily  . heparin  5,000 Units Subcutaneous Q8H  . losartan  50 mg Oral Daily  . magnesium oxide  400 mg Oral Daily  . mouth rinse  15 mL Mouth Rinse BID  . metoprolol tartrate  12.5 mg Oral BID  . mometasone-formoterol  2 puff Inhalation BID  . montelukast  10 mg Oral QHS  . nicotine  7 mg Transdermal QHS  . pantoprazole  40 mg Oral Daily  . pregabalin  50 mg Oral BID  . sodium chloride flush  3 mL Intravenous Q12H  . tamsulosin  0.4 mg Oral Daily  . ticagrelor  90 mg Oral BID   Continuous Infusions: . sodium chloride     PRN Meds: sodium chloride, acetaminophen, cyclobenzaprine, ipratropium-albuterol, nitroGLYCERIN, ondansetron (ZOFRAN) IV, oxyCODONE, sodium chloride flush   Vital Signs    Vitals:   01/15/20 0400 01/15/20 0500 01/15/20 0700 01/15/20 0724  BP: 126/82 (!) 142/92 (!) 133/94 (!) 133/94  Pulse: (!) 59 77 75 79  Resp: 16 (!) 25 (!) 24 20  Temp:    97.7 F (36.5 C)  TempSrc:    Oral  SpO2: 99% 100% 98% 98%  Weight:      Height:        Intake/Output Summary (Last 24 hours) at 01/15/2020 0932 Last data filed at 01/14/2020 2200 Gross per 24 hour  Intake 795 ml  Output --  Net 795 ml   Last 3 Weights 01/14/2020 01/13/2020 01/07/2020  Weight (lbs) 191 lb 5.8 oz 192 lb 3.9 oz 192 lb 6.4 oz  Weight (kg) 86.8 kg 87.2 kg 87.272 kg      Telemetry    Sinus rhythm.  Frequent PACs.- Personally Reviewed  ECG    01/13/2020: Sinus tachycardia.  Rate 118 bpm.   Anterolateral ST elevations- Personally Reviewed  Physical Exam   VS:  BP (!) 133/94   Pulse 79   Temp 97.7 F (36.5 C) (Oral)   Resp 20   Ht 6' (1.829 m)   Wt 86.8 kg   SpO2 98%   BMI 25.95 kg/m  , BMI Body mass index is 25.95 kg/m. GENERAL:  Well appearing HEENT: Pupils equal round and reactive, fundi not visualized, oral mucosa unremarkable NECK:  No jugular venous distention, waveform within normal limits, carotid upstroke brisk and symmetric, no bruits LUNGS:  Clear to auscultation bilaterally HEART:  RRR.  PMI not displaced or sustained,S1 and S2 within normal limits, no S3, no S4, no clicks, no rubs, no murmurs ABD:  Flat, positive bowel sounds normal in frequency in pitch, no bruits, no rebound, no guarding, no midline pulsatile mass, no hepatomegaly, no splenomegaly EXT:  2 plus pulses throughout, no edema, no cyanosis no clubbing.  R radial C/D/I SKIN:  No rashes no nodules NEURO:  Cranial nerves II through XII grossly intact, motor grossly intact throughout PSYCH:  Cognitively intact, oriented to person place and time   Labs  High Sensitivity Troponin:   Recent Labs  Lab 12/18/19 1543 01/13/20 1932 01/13/20 2130 01/14/20 0128 01/14/20 0339  TROPONINIHS 7 61* 82* 83* 106*      Chemistry Recent Labs  Lab 01/13/20 1932 01/14/20 0339  NA 138 138  K 3.7 3.9  CL 102 107  CO2 28 23  GLUCOSE 134* 142*  BUN 11 10  CREATININE 1.13 0.98  CALCIUM 9.4 9.0  PROT 6.6  --   ALBUMIN 3.4*  --   AST 19  --   ALT 28  --   ALKPHOS 86  --   BILITOT 0.6  --   GFRNONAA >60 >60  ANIONGAP 8 8     Hematology Recent Labs  Lab 01/14/20 0128 01/14/20 0339 01/15/20 0536  WBC 10.7* 9.1 9.2  RBC 5.57 5.14 5.65  HGB 16.3 14.6 16.5  HCT 50.9 45.8 49.4  MCV 91.4 89.1 87.4  MCH 29.3 28.4 29.2  MCHC 32.0 31.9 33.4  RDW 15.8* 15.6* 15.5  PLT 241 212 237    BNPNo results for input(s): BNP, PROBNP in the last 168 hours.   DDimer No results for input(s): DDIMER in  the last 168 hours.   Radiology    CARDIAC CATHETERIZATION  Result Date: 01/14/2020 1.  Severe mid LAD stenosis (culprit lesion) treated successfully with PCI using a 3.5 x 16 mm Synergy DES 2.  Mild nonobstructive proximal LAD, mid circumflex, OM 2, and proximal RCA stenoses 3.  Normal LV systolic function with normal LVEDP Recommendations: Aggressive medical therapy for secondary risk reduction.  Tobacco cessation.  The patient is loaded with ticagrelor 180 mg in the Cath Lab.  He is started on a high intensity statin drug and a beta-blocker.  Discussed cardiac catheterization findings with patient and his wife.  If no complications arise, should be eligible for hospital discharge within 24 hours.  DG Chest Portable 1 View  Result Date: 01/13/2020 CLINICAL DATA:  chest pain EXAM: PORTABLE CHEST 1 VIEW COMPARISON:  December 18, 2019 FINDINGS: The heart size and mediastinal contours are within normal limits. Both lungs are clear. The visualized skeletal structures are unremarkable. IMPRESSION: No active disease. Electronically Signed   By: Prudencio Pair M.D.   On: 01/13/2020 20:06    Cardiac Studies   LHC 01/13/20: Diagnostic Dominance: Right  Intervention     Patient Profile     69 y.o. male with CAD, hypertension, hyperlipidemia, tobacco abuse, bullous emphysema, COPD, and GERD  admitted with STEMI.  Assessment & Plan    # STEMI:  Patient was recently seen in clinic for chest pain and referred for coronary CT-A.  However he presented to hospital with an anterior STEMI last night.  High-sensitivity troponin was elevated to 106.  He was found to have severe mid LAD stenosis that was successfully PCI with a drug-eluting stent.  Otherwise mild to moderate disease.  Systolic function was normal, as was LVEDP.  Therefore we will not get an echo at this time.  Continue DAPT with ticagrelor for 1 year.  Continue atorvastatin and metoprolol.  # Hypertension:  His blood pressure was low  after cath so losartan was reduced to 25 mg.  Blood pressure is now increasing.  We will increase it back to 50 mg daily, which was his home dose.  Metoprolol was started this admission.    # Tobacco abuse:  Smoking cessation advised.  He understands that he needs to quit smoking now.  Nicotine patch was applied and he is doing  well.  # COPD: # Emphysema: Doing well with Dulera.  At home he uses Advair.  Will defer making this change to his PCP or pulmonologist as an outpatient.   For questions or updates, please contact Ali Molina Please consult [email or web address] for contact info under        Signed, Skeet Latch, MD  01/15/2020, 9:32 AM

## 2020-01-15 NOTE — Progress Notes (Signed)
Patient Alert and oriented to baseline. Stable and ambulatory to baseline. Patient verbalized understanding of the discharge instructions.  Patient belongings were taken by the patient.   

## 2020-01-15 NOTE — TOC Benefit Eligibility Note (Signed)
Transition of Care [facility]) Benefit Eligibility Note    Patient Details  Name: Peter Sims MRN: [id number] Date of Birth: 25-Apr-1950   Medication/Dose: Kary Kos 90mg . bid for 30 day supply  Covered?: Yes  Tier:  (?)  Prescription Coverage [facility]erred [facility]  Spoke with Person/Company/Phone Number:: Shirley Muscat [employer] ,PH# [phone]  Co-Pay: $76.95  Prior Approval: No  Deductible:  (No Deductible)       Shelda Altes Phone Number: 01/15/2020, 12:04 PM

## 2020-01-15 NOTE — Progress Notes (Signed)
CARDIAC REHAB PHASE I   Pt has ambulated without problems this am. BP elevated right now 150/112. Completed ed with good reception. Discussed smoking cessation more, which pt is happy to be on a nicotine patch now. Discussed diet and exercise, NTG, and CRPII. Will refer to [place].  [id number]  [place], ACSM 01/15/2020 10:27 AM

## 2020-01-15 NOTE — Discharge Instructions (Signed)
Call [facility] at [phone] if any bleeding, swelling or drainage at cath site.  May shower, no tub baths for 48 hours for groin sticks. No lifting over 5 pounds for 5 days.  No Driving for 5 days  Take 1 NTG, under your tongue, while sitting.  If no relief of pain may repeat NTG, one tab every 5 minutes up to 3 tablets total over 15 minutes.  If no relief CALL 911.  If you have dizziness/lightheadness  while taking NTG, stop taking and call 911.         Heart Healthy Diet   No strenous activity until seen in the office.    Do NOT stop Brilinta and asprin they keep the new stent in your coronary artery from clotting.

## 2020-01-15 NOTE — Telephone Encounter (Signed)
Per wife  Transition Care Management Follow-up Telephone Call  Date of discharge and from where: 01/15/2020   How have you been since you were released from the hospital? Resting at this time  Any questions or concerns? No  Items Reviewed:  Did the pt receive and understand the discharge instructions provided? Yes   Medications obtained and verified? Yes   Other? No   Any new allergies since your discharge? No   Dietary orders reviewed? Yes  Do you have support at home? Yes   Home Care and Equipment/Supplies: Were home health services ordered? not applicable If so, what is the name of the agency? n/a  Has the agency set up a time to come to the patient's home? not applicable Were any new equipment or medical supplies ordered?  No What is the name of the medical supply agency? n/a Were you able to get the supplies/equipment? not applicable Do you have any questions related to the use of the equipment or supplies? No  Functional Questionnaire: (I = Independent and D = Dependent) ADLs: I, Taking little bird baths  Bathing/Dressing- I  Meal Prep- I  Eating- I  Maintaining continence- I  Transferring/Ambulation- I  Managing Meds- I, wife helping   Follow up appointments reviewed:   PCP Hospital f/u appt confirmed? No  follow up with office staff to find an appointment.  Are transportation arrangements needed? No   If their condition worsens, is the pt aware to call PCP or go to the Emergency Dept.? Yes  Was the patient provided with contact information for the PCP's office or ED? Yes  Was to pt encouraged to call back with questions or concerns? Yes

## 2020-01-15 NOTE — Telephone Encounter (Signed)
Peter Sims is scheduled for a [facility] hospital f/u per Cecilie Kicks on 01/20/20 at 3:20 PM.

## 2020-01-16 ENCOUNTER — Telehealth: Payer: Self-pay | Admitting: Cardiovascular Disease

## 2020-01-16 ENCOUNTER — Other Ambulatory Visit: Payer: Self-pay | Admitting: *Deleted

## 2020-01-16 ENCOUNTER — Telehealth: Payer: Self-pay

## 2020-01-16 NOTE — Telephone Encounter (Signed)
Spoke with pt in a separate phone note 01/16/20. Pt aware of discharge instructions and follow up visit date and time.

## 2020-01-16 NOTE — Patient Outreach (Signed)
[facility]) Care Management  01/16/2020  Peter Sims 24-Aug-1950 [id number]  Transition of care telephone call  Referral received:01/14/20 Initial outreach:01/16/20 Insurance: [facility]   Initial unsuccessful telephone call to patient's preferred number in order to complete transition of care assessment; no answer, left HIPAA compliant voicemail message requesting return call.   Objective: Per the electronic medical record, Peter Sims   was hospitalized at [facility] 11/16-11/18/21 for Chest pain, ST elevated MI , PCI- drug eluding  stent to Mid LAD  . Comorbidities include: COPD chronic bronchitis , Hypertension ,Hyperlipidemia  He was discharged to home on 01/15/20  without the need for home health services or durable medical equipment per the discharge summary.   Plan: This RNCM will route unsuccessful outreach letter with [place] Management pamphlet and 24 hour Nurse Advice Line Magnet to [facility] clinical pool to be mailed to patient's home address. This RNCM will attempt another outreach within 4 business days.   Joylene Draft, RN, BSN  [place] Management Coordinator  [phone]- Mobile [phone]- Toll Free Main Office

## 2020-01-16 NOTE — Telephone Encounter (Signed)
Pt reports experiencing a couple of isolated episodes of chest discomfort since returning home after stenting of his LAD on 11/17. Reports it is "not severe," but is more "annoying" like a little ache.  Pt states he feels the discomfort over the left side of his chest and rates his pain as 3/10. The pt denies doing any strenuous or exertional activities. States he has just been up walking around inside or around his home when he has noticed the discomfort.   The pt does report feeling mildly short of breath, but states he is trying to get used to new inhaler prescribed during his hospitalization. Has been coughing a lot, bringing up phlegm since inhaler was switched.   Pt is also complaining of significant fatigue. Feels very tired. "Sleep pattern is all off" and reports he is not sleeping well at all. Woke up at 2:45, wide awake. Woke up again at 4 and has not been back to bed.   BP 143/96, pulse 63 during call today. Pt states he has not taken SL nitro yet because he thought it was only to be used for episodes of severe, crushing chest pain. Counseled pt that nitro tablets are appropriate to use for milder forms of chest pain/pressure and reviewed administration instructions and precautions.   Advised pt to seek urgent evaluation for worsening or evolving chest pain and shortness of breath. Reviewed date and time of follow up visit with Dr. Oval Linsey. Instructed pt to limit caffeine intake and observe good sleep hygiene to improve fatigue. Instructed pt to take all medications, especially aspirin and Brilinta, and to log his blood pressures daily.  The patient verbalizes understanding and agreement with plan.

## 2020-01-16 NOTE — Telephone Encounter (Signed)
The pt's wife was asked how he is doing and she said that he is resting, has a little chest pain and that she is going to call his cardiologist

## 2020-01-16 NOTE — Telephone Encounter (Signed)
Patient just had a stint put in on 01/14/20 and is still experiencing tiredness and pain in the chest. Please call to discuss

## 2020-01-19 NOTE — Telephone Encounter (Signed)
Thank you.  Will discuss at follow up.

## 2020-01-20 ENCOUNTER — Ambulatory Visit: Payer: 59 | Admitting: Cardiovascular Disease

## 2020-01-20 ENCOUNTER — Encounter: Payer: Self-pay | Admitting: Cardiovascular Disease

## 2020-01-20 ENCOUNTER — Other Ambulatory Visit: Payer: Self-pay

## 2020-01-20 VITALS — BP 118/70 | HR 82 | Ht 72.0 in | Wt 191.0 lb

## 2020-01-20 DIAGNOSIS — I10 Essential (primary) hypertension: Secondary | ICD-10-CM | POA: Diagnosis not present

## 2020-01-20 DIAGNOSIS — I214 Non-ST elevation (NSTEMI) myocardial infarction: Secondary | ICD-10-CM | POA: Diagnosis not present

## 2020-01-20 DIAGNOSIS — I2102 ST elevation (STEMI) myocardial infarction involving left anterior descending coronary artery: Secondary | ICD-10-CM

## 2020-01-20 DIAGNOSIS — I700 Atherosclerosis of aorta: Secondary | ICD-10-CM | POA: Diagnosis not present

## 2020-01-20 NOTE — Progress Notes (Signed)
Cardiology Office Note   Date:  01/20/2020   ID:  Sims, Peter [birth year], MRN [id number]  PCP:  Glendale Chard, MD  Cardiologist:   Skeet Latch, MD   No chief complaint on file.    History of Present Illness: Peter Sims is a 69 y.o. male with CAD s/p anterior STEMI, hypertension, hyperlipidemia, tobacco abuse, bullous emphysema, COPD, and GERD here for follow up.  He was initially seen 12/2019 for the evaluation of chest pain at the request.  He was seen in the ED 11/2019 with precordial chest pain.  He also had pain in his back and neck at the time.  He had an episode of getting lightheaded and sweaty after having a bowel movement.  He felt lethargic and sweaty.  He also had slurred speech.  He called EMS and did orthostatic vitals that reportedly showed hypotension.  His blood pressure improved and they decided that he did not need to go to the hospital.  He called his PCP the following day and they recommended that he go to the emergency department for evaluation.  In the ED, high-sensitivity troponin revealed a pattern of 8-->22-->7.  EKG revealed sinus tachycardia to 112 bpm.  There were no acute ischemic changes.  Chest CT-A revealed pulmonary nodule, foci of atherosclerosis in the coronaries and aorta.  He was felt to be stable to be discharged and followed up with cardiology as an outpatient.  [name] recalls another similar episode that occurred when standing.  He was outside talking with a neighbor.  He then got a sharp pain in his chest.  He started walking into the kitchen and started seating profusely.  He was unable to speak to them.  He knew what he wanted to say but couldn't get the words out.  He nearly passed out.  The pain he describes was debilitating, sharp pain in the shoulder that felt like a spasm.  He has experienced pain like this other times when he turns too quickly.    At his initial appointment he was referred for coronary CT-A.   However before he could have this procedure he presented back to the hospital with chest pain.  Initial EKG was not consistent with STEMI.  High-sensitivity troponin was elevated to the 80s and then increased to 106.  He had an acute episode of chest pain overnight and was taken to the Cath Lab.  He developed an anterior S elevation MI and had PCI of the mid LAD.  He otherwise had mild to moderate lesions that were medically managed.  He was started on metoprolol and ticagrelor.  Left ventriculogram at cath revealed normal systolic function.  Since discharge she has been feeling well.  He has been somewhat sluggish and fatigued.  He has started walking a couple blocks and has no exertional chest pain.  He does get short of breath.  He notes that this is worse after taking his ticagrelor.  He has no lower extremity edema, orthopnea, or PND.  He has occasional dull pain in his left chest that comes and goes randomly.  It is never with exertion.  He is working on smoking cessation.  He is down to 1 to 3 cigarettes daily.  He wears a patch but takes it off when he needs to smoke.  He plans to stop completely soon.  He has not had any recurrent lightheadedness.  He notes increased insomnia lately.  Past Medical History:  Diagnosis Date  .  Arthritis   . Bullous emphysema (HCC)    Severe Bilateral  . Cocaine abuse ([place])   . Colitis    with bleeding  . COPD (chronic obstructive pulmonary disease) ([place])   . Dyspnea   . GERD (gastroesophageal reflux disease)   . Headache    sinusitis  . Hypertension   . LBP (low back pain)    now chronic  . Lumbago   . Lung mass    Right Upper Lobe pleural based mass, negative  on PET Scan  . Neck pain    nerve pain   . Pneumonia    hx  . S/P angioplasty with stent 01/13/20 emergent DES to mLAD  01/15/2020  . Spine pain    nerve  . Therapeutic drug monitoring   . Tobacco abuse     Past Surgical History:  Procedure Laterality Date  . COLONOSCOPY    .  CORONARY/GRAFT ACUTE MI REVASCULARIZATION N/A 01/14/2020   Procedure: Coronary/Graft Acute MI Revascularization;  Surgeon: Sherren Mocha, MD;  Location: [facility];  Service: Cardiovascular;  Laterality: N/A;  . LEFT HEART CATH AND CORONARY ANGIOGRAPHY N/A 01/14/2020   Procedure: LEFT HEART CATH AND CORONARY ANGIOGRAPHY;  Surgeon: Sherren Mocha, MD;  Location: [facility];  Service: Cardiovascular;  Laterality: N/A;  . LUMBAR LAMINECTOMY/DECOMPRESSION MICRODISCECTOMY Right 05/03/2016   Procedure: MICRODISCECTOMY LUMBAR FIVE- SACRAL ONE RIGHT;  Surgeon: Ashok Pall, MD;  Location: [facility];  Service: Neurosurgery;  Laterality: Right;  . NO PAST SURGERIES       Current Outpatient Medications  Medication Sig Dispense Refill  . acetaminophen (TYLENOL) 325 MG tablet Take 2 tablets (650 mg total) by mouth every 4 (four) hours as needed for headache or mild pain.    Marland Kitchen albuterol (PROVENTIL HFA;VENTOLIN HFA) 108 (90 BASE) MCG/ACT inhaler Inhale 2 puffs into the lungs every 6 (six) hours as needed for wheezing or shortness of breath.     Marland Kitchen aspirin 81 MG chewable tablet Chew 1 tablet (81 mg total) by mouth daily.    Marland Kitchen atorvastatin (LIPITOR) 80 MG tablet Take 1 tablet (80 mg total) by mouth daily. 30 tablet 6  . Budeson-Glycopyrrol-Formoterol (BREZTRI AEROSPHERE) 160-9-4.8 MCG/ACT AERO Inhale 1 puff into the lungs 2 (two) times daily.     Marland Kitchen buPROPion (WELLBUTRIN XL) 150 MG 24 hr tablet Take 150 mg by mouth as needed.    . Carboxymethylcellul-Glycerin (CLEAR EYES FOR DRY EYES OP) Apply 1 drop to eye daily as needed (for dry eyes).    . cyclobenzaprine (FLEXERIL) 10 MG tablet TAKE 1 TABLET (10 MG TOTAL) BY MOUTH AS NEEDED FOR MUSCLE SPASMS. 30 tablet 0  . diclofenac Sodium (VOLTAREN) 1 % GEL Apply 2 g topically 2 (two) times daily.    Marland Kitchen ipratropium-albuterol (DUONEB) 0.5-2.5 (3) MG/3ML SOLN INHALE 1 VIAL (3MLS) USING NEBULIZER 4 TIMES PER DAY 270 mL 11  . linaclotide (LINZESS) 72 MCG capsule  Take 72 mcg by mouth as needed.    Marland Kitchen losartan (COZAAR) 50 MG tablet TAKE 1 TABLET BY MOUTH DAILY 90 tablet 2  . Magnesium 400 MG CAPS Take 400 mg by mouth daily. 30 capsule 1  . metoprolol tartrate (LOPRESSOR) 25 MG tablet Take 0.5 tablets (12.5 mg total) by mouth 2 (two) times daily. 30 tablet 6  . mometasone-formoterol (DULERA) 200-5 MCG/ACT AERO Inhale 2 puffs into the lungs 2 (two) times daily. 1 each 3  . montelukast (SINGULAIR) 10 MG tablet Take 10 mg by mouth at bedtime.  1  . nicotine (NICODERM CQ - DOSED IN MG/24 HR) 7 mg/24hr patch Place 1 patch (7 mg total) onto the skin at bedtime. 28 patch 0  . nitroGLYCERIN (NITROSTAT) 0.4 MG SL tablet Place 1 tablet (0.4 mg total) under the tongue every 5 (five) minutes as needed for chest pain. 25 tablet 4  . omeprazole (PRILOSEC) 40 MG capsule TAKE 1 CAPSULE BY MOUTH EVERY DAY BEFORE A MEAL 90 capsule 2  . pregabalin (LYRICA) 50 MG capsule TAKE 1 CAPSULE BY MOUTH TWICE DAILY 60 capsule 3  . Respiratory Therapy Supplies (FLUTTER) DEVI Use as directed 1 each 0  . tamsulosin (FLOMAX) 0.4 MG CAPS capsule Take 0.4 mg by mouth daily.    . ticagrelor (BRILINTA) 90 MG TABS tablet Take 1 tablet (90 mg total) by mouth 2 (two) times daily. 60 tablet 11   No current facility-administered medications for this visit.    Allergies:   Penicillins    Social History:  The patient  reports that he has been smoking cigarettes. He has a 22.50 pack-year smoking history. He has never used smokeless tobacco. He reports current alcohol use. He reports current drug use. Drugs: Cocaine and Marijuana.   Family History:  The patient's family history includes Breast cancer in his sister; Cancer in his father; Emphysema in his mother; Heart failure in his sister; Lung cancer in his father. He was adopted.    ROS:  Please see the history of present illness.   Otherwise, review of systems are positive for none.   All other systems are reviewed and negative.    PHYSICAL  EXAM: VS:  BP 118/70   Pulse 82   Ht 6' (1.829 m)   Wt 191 lb (86.6 kg)   BMI 25.90 kg/m  , BMI Body mass index is 25.9 kg/m. GENERAL:  Well appearing HEENT: Pupils equal round and reactive, fundi not visualized, oral mucosa unremarkable NECK:  No jugular venous distention, waveform within normal limits, carotid upstroke brisk and symmetric, no bruits LUNGS:  Clear to auscultation bilaterally HEART:  RRR.  PMI not displaced or sustained,S1 and S2 within normal limits, no S3, no S4, no clicks, no rubs, no murmurs ABD:  Flat, positive bowel sounds normal in frequency in pitch, no bruits, no rebound, no guarding, no midline pulsatile mass, no hepatomegaly, no splenomegaly EXT:  2 plus pulses throughout, no edema, no cyanosis no clubbing SKIN:  No rashes no nodules NEURO:  Cranial nerves II through XII grossly intact, motor grossly intact throughout PSYCH:  Cognitively intact, oriented to person place and time   EKG:  EKG is ordered today. 01/20/2020: Sinus arrhythmia.  Rate 82 bpm.  LHC 01/14/20:  1.  Severe mid LAD stenosis (culprit lesion) treated successfully with PCI using a 3.5 x 16 mm Synergy DES 2.  Mild nonobstructive proximal LAD, mid circumflex, OM 2, and proximal RCA stenoses 3.  Normal LV systolic function with normal LVEDP  Recommendations: Aggressive medical therapy for secondary risk reduction.  Tobacco cessation.  The patient is loaded with ticagrelor 180 mg in the Cath Lab.  He is started on a high intensity statin drug and a beta-blocker.  Discussed cardiac catheterization findings with patient and his wife.  If no complications arise, should be eligible for hospital discharge within 24 hours.   Recent Labs: 01/13/2020: ALT 28 01/14/2020: BUN 10; Creatinine, Ser 0.98; Potassium 3.9; Sodium 138 01/15/2020: Hemoglobin 16.5; Platelets 237    Lipid Panel    Component Value Date/Time  CHOL 164 01/14/2020 0128   CHOL 227 (H) 12/02/2019 1219   TRIG 62 01/14/2020  0128   HDL 61 01/14/2020 0128   HDL 53 12/02/2019 1219   CHOLHDL 2.7 01/14/2020 0128   VLDL 12 01/14/2020 0128   LDLCALC 91 01/14/2020 0128   LDLCALC 153 (H) 12/02/2019 1219      Wt Readings from Last 3 Encounters:  01/20/20 191 lb (86.6 kg)  01/14/20 191 lb 5.8 oz (86.8 kg)  01/07/20 192 lb 6.4 oz (87.3 kg)      ASSESSMENT AND PLAN:  # CAD s/p STEMI and LAD PCI: # Hyperlipidemia: Mr. Rex Kras has been doing well since his recent hospitalization.  I suspect his shortness of breath is due to the ticagrelor.  We discussed taking caffeine prior to his dose.  He is willing to live with it for now because he states his symptoms are not that bad.  We also discussed reducing the dose of his metoprolol but he is okay with keeping it for now.  Repeat lipids and a CMP in 3 months given that his statin was started during hospitalization.  He is okay to start cardiac rehab.  # Near syncope:  Mr. Connaughton's episode sound like classic vasovagal hypotension.  No recent symptoms.  # Tobacco abuse:  Recommend cessation.  Continue nicotine replacement patch.  # Hypertension:  BP stable on l metoprolol  Current medicines are reviewed at length with the patient today.  The patient does not have concerns regarding medicines.  The following changes have been made: None  Labs/ tests ordered today include:   No orders of the defined types were placed in this encounter.    Disposition:   FU with Renne Platts C. Oval Linsey, MD, [facility] in 6 months.    Signed, Trenda Corliss C. Oval Linsey, MD, [facility]  01/20/2020 5:14 PM    [facility]

## 2020-01-20 NOTE — Patient Instructions (Addendum)
Medication Instructions:  Your physician recommends that you continue on your current medications as directed. Please refer to the Current Medication list given to you today.  *If you need a refill on your cardiac medications before your next appointment, please call your pharmacy*  Lab Work: FASTING LP/CMET IN 3 MONTHS   If you have labs (blood work) drawn today and your tests are completely normal, you will receive your results only by: Marland Kitchen MyChart Message (if you have MyChart) OR . A paper copy in the mail If you have any lab test that is abnormal or we need to change your treatment, we will call you to review the results.  Testing/Procedures: NONE   Follow-Up: At [facility], you and your health needs are our priority.  As part of our continuing mission to provide you with exceptional heart care, we have created designated Provider Care Teams.  These Care Teams include your primary Cardiologist (physician) and Advanced Practice Providers (APPs -  Physician Assistants and Nurse Practitioners) who all work together to provide you with the care you need, when you need it.  We recommend signing up for the patient portal called "MyChart".  Sign up information is provided on this After Visit Summary.  MyChart is used to connect with patients for Virtual Visits (Telemedicine).  Patients are able to view lab/test results, encounter notes, upcoming appointments, etc.  Non-urgent messages can be sent to your provider as well.   To learn more about what you can do with MyChart, go to [email or web address].    Your next appointment:   6 month(s)  You will receive a reminder letter in the mail two months in advance. If you don't receive a letter, please call our office to schedule the follow-up appointment.  The format for your next appointment:   In Person  Provider:   You may see Skeet Latch, MD or one of the following Advanced Practice Providers on your designated Care Team:    Kerin Ransom, PA-C  [place], [place]  Coletta Memos, [place]

## 2020-01-21 ENCOUNTER — Other Ambulatory Visit: Payer: Self-pay | Admitting: *Deleted

## 2020-01-21 NOTE — Patient Outreach (Signed)
[place] [facility]) Care Management  01/21/2020  Peter Sims 07-17-1950 [id number]  Transition of care call Referral received: 01/14/20 Initial outreach attempt: 01/16/20 Insurance: UMR    2nd unsuccessful telephone call to patient's preferred contact number in order to complete post hospital discharge transition of care assessment , no answer left HIPAA compliant message requesting return call.    Objective: Per the electronic medical record, Peter Sims   was hospitalized at [facility] 11/16-11/18/21 for Chest pain, ST elevated MI , PCI- drug eluding  stent to Mid LAD  . Comorbidities include: COPD chronic bronchitis , Hypertension ,Hyperlipidemia  He was discharged to home on 01/15/20  without the need for home health services or durable medical equipment per the discharge summary.    Plan If no return call from patient will attempt 3rd outreach in the next 4 business days.    Joylene Draft, RN, BSN  [place] Management Coordinator  [phone]- Mobile [phone]- Toll Free Main Office

## 2020-01-26 ENCOUNTER — Other Ambulatory Visit: Payer: Self-pay | Admitting: *Deleted

## 2020-01-26 NOTE — Patient Outreach (Signed)
[place] [facility]) Care Management  01/26/2020  IAAN OREGEL Feb 21, 1951 [id number]    Transition of care call  Referral received: 01/14/20 Initial outreach attempt: 01/16/20 Insurance: [employer] unsuccessful telephone call to patient's preferred contact number in order to complete post hospital discharge transition of care assessment; no answer, left HIPAA compliant message requesting return call.   Objective: Per the electronic medical record, Mr. Tufo hospitalized at [facility] 11/16-11/18/21 for Chest pain, ST elevated MI , PCI- drug eluding stent to Mid LAD . Comorbidities include: COPD chronic bronchitis , Hypertension ,Hyperlipidemia Hewas discharged to home on 11/18/21without the need for home health services or durable medical equipment per the discharge summary.    Plan: If no return call from patient, will plan 4th call attempt in the next 3 weeks per workflow.   Joylene Draft, RN, BSN  [place] Management Coordinator  [phone]- Mobile [phone]- Toll Free Main Office

## 2020-01-27 ENCOUNTER — Telehealth: Payer: Self-pay | Admitting: Cardiovascular Disease

## 2020-01-27 NOTE — Telephone Encounter (Signed)
    Pt's wife calling, she said when she filled up FMLA form she put Dr. Oval Linsey as one of primary care provider. She wanted to know if Dr. Oval Linsey received the paperwork from [facility] [employer]

## 2020-01-27 NOTE — Telephone Encounter (Signed)
Message routed to the nurse. 

## 2020-01-28 ENCOUNTER — Telehealth: Payer: Self-pay | Admitting: Cardiovascular Disease

## 2020-01-28 ENCOUNTER — Encounter (HOSPITAL_COMMUNITY): Payer: Self-pay | Admitting: *Deleted

## 2020-01-28 NOTE — Telephone Encounter (Signed)
Forms completed and faxed Dr Oval Linsey has been out of office Left message to call back

## 2020-01-28 NOTE — Telephone Encounter (Signed)
Forms faxed from [employer] on 12/1, handed to [place].

## 2020-01-28 NOTE — Progress Notes (Signed)
Upon chart review. Dr Oval Linsey has cleared the patient to participate in phase 2 cardiac rehab. Will forward to the scheduling staff.Barnet Pall, RN,BSN 01/28/2020 2:35 PM

## 2020-01-29 ENCOUNTER — Other Ambulatory Visit: Payer: Self-pay | Admitting: Internal Medicine

## 2020-01-29 MED FILL — LOSARTAN POTASSIUM 50 MG TA: 50 | 90 days supply | Qty: 90 | Fill #0

## 2020-01-30 ENCOUNTER — Telehealth: Payer: Self-pay | Admitting: Cardiovascular Disease

## 2020-01-30 MED FILL — PREGABALIN 50 MG CAPS: 50 | 30 days supply | Qty: 60 | Fill #0

## 2020-01-30 NOTE — Telephone Encounter (Signed)
Pt c/o medication issue:  1. Name of Medication:  metoprolol tartrate (LOPRESSOR) 25 MG tablet ticagrelor (BRILINTA) 90 MG TABS tablet  2. How are you currently taking this medication (dosage and times per day)? Half tablet of metoprolol twice a day, 1 tablet of brilinta twice a day  3. Are you having a reaction (difficulty breathing--STAT)? no  4. What is your medication issue? Patient states he has been having cramping in his stomach and is not sure if it is a side effect of the medications

## 2020-01-30 NOTE — Telephone Encounter (Signed)
Spoke with patient. Patient does not take medication with food. Patient is going to start taking metoprolol with breakfast foods. No further questions.

## 2020-01-30 NOTE — Telephone Encounter (Signed)
Metoprolol lists stomach pain as a < 1% side effect.  Make sure he's taking it with food.  If he is, then we may need to consider a different beta blocker.

## 2020-02-03 NOTE — Telephone Encounter (Signed)
Received message from medical records to call patient and wife (tammy) to give  status update on FMLA forms. I called patient and wife on 12/7 to discuss status of FMLA forms. Will call patient once forms are completed.

## 2020-02-09 ENCOUNTER — Telehealth (HOSPITAL_COMMUNITY): Payer: Self-pay

## 2020-02-09 ENCOUNTER — Encounter (HOSPITAL_COMMUNITY): Payer: Self-pay

## 2020-02-09 NOTE — Telephone Encounter (Signed)
Attempted to call patient in regards to Cardiac Rehab - LM on VM Mailed letter 

## 2020-02-11 ENCOUNTER — Other Ambulatory Visit: Payer: Self-pay | Admitting: *Deleted

## 2020-02-11 ENCOUNTER — Encounter (HOSPITAL_COMMUNITY): Payer: Self-pay | Admitting: Cardiovascular Disease

## 2020-02-11 NOTE — Patient Outreach (Signed)
Peter Sims [facility]) Care Management  02/11/2020  Peter Sims May 27, 1950 [id number]  Transition of care /Case Closure Unsuccessful outreach    Referral received:01/14/20 Initial outreach:01/16/20 Insurance: UMR    Unable to complete post hospital discharge transition of care assessment. No return call form patient after 3 call attempts and no response to request to contact RN Care Coordinator in unsuccessful outreach letter mailed to home on 01/16/20.  Objective: Marland Kitchen Per the electronic medical record, Peter Sims hospitalized at [facility] 11/16-11/18/21 for Chest pain, ST elevated MI , PCI- drug eluding stent to Mid LAD . Comorbidities include: COPD chronic bronchitis , Hypertension ,Hyperlipidemia Hewas discharged to home on 11/18/21without the need for home health services or durable medical equipment per the discharge summary.  Plan Case closed to [place] care management services, unsuccessful call attempts x 4 , no return response from outreach letter.    Peter Draft, RN, BSN  [place] Management Coordinator  [phone]- Mobile [phone]- Toll Free Main Office

## 2020-02-18 ENCOUNTER — Ambulatory Visit (INDEPENDENT_AMBULATORY_CARE_PROVIDER_SITE_OTHER): Payer: 59 | Admitting: Internal Medicine

## 2020-02-18 ENCOUNTER — Encounter: Payer: Self-pay | Admitting: Internal Medicine

## 2020-02-18 ENCOUNTER — Other Ambulatory Visit: Payer: Self-pay

## 2020-02-18 VITALS — BP 114/78 | HR 100 | Temp 97.7°F | Ht 72.0 in | Wt 187.2 lb

## 2020-02-18 DIAGNOSIS — F17200 Nicotine dependence, unspecified, uncomplicated: Secondary | ICD-10-CM | POA: Diagnosis not present

## 2020-02-18 DIAGNOSIS — R102 Pelvic and perineal pain: Secondary | ICD-10-CM | POA: Diagnosis not present

## 2020-02-18 DIAGNOSIS — K5909 Other constipation: Secondary | ICD-10-CM | POA: Diagnosis not present

## 2020-02-18 DIAGNOSIS — R3129 Other microscopic hematuria: Secondary | ICD-10-CM

## 2020-02-18 DIAGNOSIS — I2510 Atherosclerotic heart disease of native coronary artery without angina pectoris: Secondary | ICD-10-CM | POA: Diagnosis not present

## 2020-02-18 DIAGNOSIS — Z6825 Body mass index (BMI) 25.0-25.9, adult: Secondary | ICD-10-CM | POA: Diagnosis not present

## 2020-02-18 DIAGNOSIS — I2583 Coronary atherosclerosis due to lipid rich plaque: Secondary | ICD-10-CM | POA: Diagnosis not present

## 2020-02-18 LAB — POCT URINALYSIS DIPSTICK
Glucose, UA: NEGATIVE
Ketones, UA: NEGATIVE
Leukocytes, UA: NEGATIVE
Nitrite, UA: NEGATIVE
Protein, UA: POSITIVE — AB
Spec Grav, UA: 1.02 (ref 1.010–1.025)
Urobilinogen, UA: 0.2 E.U./dL
pH, UA: 7 (ref 5.0–8.0)

## 2020-02-18 NOTE — Telephone Encounter (Signed)
Called patients wife and spoke with case worker for FMLA to get clarification of what was incorrect, spoke to [place] on what needed to be corrected. Once Dr.Pepper Pike is in office and patient starts cardiac rehab paperwork can be corrected.

## 2020-02-18 NOTE — Patient Instructions (Signed)
Abdominal Pain, Adult Pain in the abdomen (abdominal pain) can be caused by many things. Often, abdominal pain is not serious and it gets better with no treatment or by being treated at home. However, sometimes abdominal pain is serious. Your health care provider will ask questions about your medical history and do a physical exam to try to determine the cause of your abdominal pain. Follow these instructions at home:  Medicines  Take over-the-counter and prescription medicines only as told by your health care provider.  Do not take a laxative unless told by your health care provider. General instructions  Watch your condition for any changes.  Drink enough fluid to keep your urine pale yellow.  Keep all follow-up visits as told by your health care provider. This is important. Contact a health care provider if:  Your abdominal pain changes or gets worse.  You are not hungry or you lose weight without trying.  You are constipated or have diarrhea for more than 2-3 days.  You have pain when you urinate or have a bowel movement.  Your abdominal pain wakes you up at night.  Your pain gets worse with meals, after eating, or with certain foods.  You are vomiting and cannot keep anything down.  You have a fever.  You have blood in your urine. Get help right away if:  Your pain does not go away as soon as your health care provider told you to expect.  You cannot stop vomiting.  Your pain is only in areas of the abdomen, such as the right side or the left lower portion of the abdomen. Pain on the right side could be caused by appendicitis.  You have bloody or black stools, or stools that look like tar.  You have severe pain, cramping, or bloating in your abdomen.  You have signs of dehydration, such as: ? Dark urine, very little urine, or no urine. ? Cracked lips. ? Dry mouth. ? Sunken eyes. ? Sleepiness. ? Weakness.  You have trouble breathing or chest  pain. Summary  Often, abdominal pain is not serious and it gets better with no treatment or by being treated at home. However, sometimes abdominal pain is serious.  Watch your condition for any changes.  Take over-the-counter and prescription medicines only as told by your health care provider.  Contact a health care provider if your abdominal pain changes or gets worse.  Get help right away if you have severe pain, cramping, or bloating in your abdomen. This information is not intended to replace advice given to you by your health care provider. Make sure you discuss any questions you have with your health care provider. Document Revised: 06/24/2018 Document Reviewed: 06/24/2018 Elsevier Patient Education  2020 [employer]  

## 2020-02-18 NOTE — Progress Notes (Addendum)
I,[name],acting as a Education administrator for [name], MD.,have documented all relevant documentation on the behalf of [name], MD,as directed by  [name], MD while in the presence of [name], MD.  This visit occurred during the SARS-CoV-2 public health emergency.  Safety protocols were in place, including screening questions prior to the visit, additional usage of staff PPE, and extensive cleaning of exam room while observing appropriate contact time as indicated for disinfecting solutions.  Subjective:     [name] , male    DOB: 31-Jan-1951 , 69 y.o.   MRN: [id number]   Chief Complaint  Patient presents with  . Abdominal Pain  . Constipation    HPI  The patient is here for evaluation of constipation and abdominal discomfort. He reports his sx started over a week ago. He has h/o constipation. Seems that it has become more difficult for him to stay regular. Admits he is not getting much activity. Has tried OTC remedies with minimal relief. He denies n/v/d. He reports his discomfort is usually relieved with a bowel movement. He denies fever/chills. He reports having a good appetite as well.   Abdominal Pain This is a recurrent problem. The current episode started in the past 7 days. The onset quality is gradual. The pain is located in the suprapubic region. The pain is at a severity of 5/10. Associated symptoms include constipation. The pain is relieved by belching, bowel movements and passing flatus. The treatment provided mild relief. There is no history of colon cancer or Crohn's disease.     Past Medical History:  Diagnosis Date  . Arthritis   . Bullous emphysema (HCC)    Severe Bilateral  . Cocaine abuse ([place])   . Colitis    with bleeding  . COPD (chronic obstructive pulmonary disease) ([place])   . Dyspnea   . GERD (gastroesophageal reflux disease)   . Headache    sinusitis  . Hypertension   . LBP (low back pain)    now chronic   . Lumbago   . Lung mass    Right Upper Lobe pleural based mass, negative  on PET Scan  . Neck pain    nerve pain   . Pneumonia    hx  . S/P angioplasty with stent 01/13/20 emergent DES to mLAD  01/15/2020  . Shortness of breath 03/03/2020  . Spine pain    nerve  . Therapeutic drug monitoring   . Tobacco abuse      Family History  Adopted: Yes  Problem Relation Age of Onset  . Cancer Father   . Lung cancer Father   . Heart failure Sister   . Breast cancer Sister   . Emphysema Mother      Current Outpatient Medications:  .  acetaminophen (TYLENOL) 325 MG tablet, Take 2 tablets (650 mg total) by mouth every 4 (four) hours as needed for headache or mild pain., Disp: , Rfl:  .  albuterol (PROVENTIL HFA;VENTOLIN HFA) 108 (90 BASE) MCG/ACT inhaler, Inhale 2 puffs into the lungs every 6 (six) hours as needed for wheezing or shortness of breath. , Disp: , Rfl:  .  aspirin 81 MG chewable tablet, Chew 1 tablet (81 mg total) by mouth daily., Disp: , Rfl:  .  atorvastatin (LIPITOR) 80 MG tablet, Take 1 tablet (80 mg total) by mouth daily., Disp: 30 tablet, Rfl: 6 .  buPROPion (WELLBUTRIN XL) 150 MG 24 hr tablet, Take 150 mg by mouth  as needed., Disp: , Rfl:  .  Carboxymethylcellul-Glycerin (CLEAR EYES FOR DRY EYES OP), Apply 1 drop to eye daily as needed (for dry eyes)., Disp: , Rfl:  .  diclofenac Sodium (VOLTAREN) 1 % GEL, Apply 2 g topically 2 (two) times daily., Disp: , Rfl:  .  ipratropium-albuterol (DUONEB) 0.5-2.5 (3) MG/3ML SOLN, INHALE 1 VIAL (3MLS) USING NEBULIZER 4 TIMES PER DAY, Disp: 270 mL, Rfl: 11 .  losartan (COZAAR) 50 MG tablet, TAKE 1 TABLET BY MOUTH DAILY, Disp: 90 tablet, Rfl: 2 .  Magnesium 400 MG CAPS, Take 400 mg by mouth daily., Disp: 30 capsule, Rfl: 1 .  metoprolol tartrate (LOPRESSOR) 25 MG tablet, Take 0.5 tablets (12.5 mg total) by mouth 2 (two) times daily. (Patient taking differently: Take 12.5 mg by mouth daily.), Disp: 30 tablet, Rfl: 6 .   mometasone-formoterol (DULERA) 200-5 MCG/ACT AERO, Inhale 2 puffs into the lungs 2 (two) times daily., Disp: 1 each, Rfl: 3 .  nicotine (NICODERM CQ - DOSED IN MG/24 HR) 7 mg/24hr patch, Place 1 patch (7 mg total) onto the skin at bedtime., Disp: 28 patch, Rfl: 0 .  nitroGLYCERIN (NITROSTAT) 0.4 MG SL tablet, Place 1 tablet (0.4 mg total) under the tongue every 5 (five) minutes as needed for chest pain., Disp: 25 tablet, Rfl: 4 .  pregabalin (LYRICA) 50 MG capsule, TAKE 1 CAPSULE BY MOUTH TWICE DAILY, Disp: 60 capsule, Rfl: 3 .  Respiratory Therapy Supplies (FLUTTER) DEVI, Use as directed, Disp: 1 each, Rfl: 0 .  tamsulosin (FLOMAX) 0.4 MG CAPS capsule, Take 0.4 mg by mouth daily., Disp: , Rfl:  .  clopidogrel (PLAVIX) 75 MG tablet, TAKE 300 MG TIMES 1 DOSE TOMORROW AND THEN TAKE 1 TABLET DAILY, Disp: 93 tablet, Rfl: 3 .  cyclobenzaprine (FLEXERIL) 10 MG tablet, TAKE 1 TABLET (10 MG TOTAL) BY MOUTH AS NEEDED FOR MUSCLE SPASMS., Disp: 30 tablet, Rfl: 0 .  montelukast (SINGULAIR) 10 MG tablet, TAKE 1 TABLET BY MOUTH EVERY DAY IN THE EVENING, Disp: 30 tablet, Rfl: 3 .  pantoprazole (PROTONIX) 40 MG tablet, Take 1 tablet (40 mg total) by mouth daily., Disp: 90 tablet, Rfl: 3   Allergies  Allergen Reactions  . Penicillins Anaphylaxis and Hives    Has patient had a PCN reaction causing immediate rash, facial/tongue/throat swelling, SOB or lightheadedness with hypotension: No Has patient had a PCN reaction causing severe rash involving mucus membranes or skin necrosis: No Has patient had a PCN reaction that required hospitalization No Has patient had a PCN reaction occurring within the last 10 years: No If all of the above answers are "NO", then may proceed with Cephalosporin use.       Review of Systems  Constitutional: Negative.   Respiratory: Negative.   Cardiovascular: Negative.   Gastrointestinal: Positive for abdominal pain and constipation.  Genitourinary: Positive for urgency.   Psychiatric/Behavioral: Negative.   All other systems reviewed and are negative.    Today's Vitals   02/18/20 1526  BP: 114/78  Pulse: 100  Temp: 97.7 F (36.5 C)  TempSrc: Oral  SpO2: 98%  Weight: 187 lb 3.2 oz (84.9 kg)  Height: 6' (1.829 m)  PainSc: 4   PainLoc: Abdomen   Body mass index is 25.39 kg/m.  Wt Readings from Last 3 Encounters:  03/03/20 186 lb 12.8 oz (84.7 kg)  02/18/20 187 lb 3.2 oz (84.9 kg)  01/20/20 191 lb (86.6 kg)   Objective:  Physical Exam Vitals and nursing note reviewed.  Constitutional:  Appearance: Normal appearance. He is well-developed.  HENT:     Head: Normocephalic and atraumatic.  Cardiovascular:     Rate and Rhythm: Normal rate and regular rhythm.     Heart sounds: Normal heart sounds.  Pulmonary:     Breath sounds: Normal breath sounds.  Abdominal:     General: Abdomen is protuberant. Bowel sounds are normal.     Palpations: Abdomen is soft.     Tenderness: There is abdominal tenderness in the suprapubic area. There is no right CVA tenderness or left CVA tenderness.  Skin:    General: Skin is warm.  Neurological:     General: No focal deficit present.     Mental Status: He is alert and oriented to person, place, and time.       Assessment And Plan:     1. Chronic constipation Comments: Chronic. He has had bowel movement this morning. This is likely exacerbating his abdominal discomfort. He has been on Linzess in the past, he states it is ineffective. ??pelvic floor dysfunction. He may benefit from PT.   2. Suprapubic abdominal pain Comments: I will check urinalysis today.  - POCT Urinalysis Dipstick (81002)  3. Microscopic hematuria Comments: I will check urine culture.  - Culture, Urine  4. Coronary atherosclerosis due to lipid rich plaque Comments: He is encouraged to follow a heart healthy lifestyle. importance of statin/med compliance was discussed with the patient.   5. Tobacco use disorder Comments: He has  decreased down to 6 cigs/day. He is encouraged to cut back further and eventually quit. I hope we can wean him completely w/in six weeks. He is advised to decrease number of cigs smoked per day by one cig per week.   6. Adult BMI 25.0-25.9 kg/sq m Comments: His BMI is stable for his demographic.He is encouraged to incorporate more exercise into his daily routine.   Patient was given opportunity to ask questions. Patient verbalized understanding of the plan and was able to repeat key elements of the plan. All questions were answered to their satisfaction.  [name], MD   I, [name], MD, have reviewed all documentation for this visit. The documentation for 02/18/20 visit for the exam, diagnosis, procedures, and orders are all accurate and complete.  THE PATIENT IS ENCOURAGED TO PRACTICE SOCIAL DISTANCING DUE TO THE COVID-19 PANDEMIC.

## 2020-02-18 NOTE — Telephone Encounter (Signed)
Forms were completed and faxed, patients wife tammy informed us that something was incorrect on the paperwork, spoke with patient wife, and fmla case worker to get clarification on what needed to be corrected, gave copy back to [place] to correct when Dr.randolphs is back in the office.

## 2020-02-18 NOTE — Telephone Encounter (Signed)
Wife of patient called. Wife states FMLA forms were filled out incorrectly. Please assist wife

## 2020-02-18 NOTE — Telephone Encounter (Signed)
Will forward to [place] who has been speaking with patients wife regarding FMLA

## 2020-02-20 LAB — URINE CULTURE: Organism ID, Bacteria: NO GROWTH

## 2020-02-23 MED FILL — METOPROLOL TARTRATE 25 MG T: 25 | 90 days supply | Qty: 90 | Fill #1

## 2020-02-23 MED FILL — BRILINTA 90 MG TABLET: 90 | 90 days supply | Qty: 180 | Fill #1

## 2020-02-23 MED FILL — TAMSULOSIN HCL 0.4 MG CAP: 0.4 | 90 days supply | Qty: 90 | Fill #7

## 2020-02-23 MED FILL — ATORVASTATIN 80 MG TABLET: 80 | 90 days supply | Qty: 90 | Fill #1

## 2020-02-26 ENCOUNTER — Other Ambulatory Visit: Payer: Self-pay | Admitting: Internal Medicine

## 2020-02-26 DIAGNOSIS — R109 Unspecified abdominal pain: Secondary | ICD-10-CM | POA: Diagnosis not present

## 2020-02-26 DIAGNOSIS — K625 Hemorrhage of anus and rectum: Secondary | ICD-10-CM | POA: Diagnosis not present

## 2020-02-26 DIAGNOSIS — R140 Abdominal distension (gaseous): Secondary | ICD-10-CM | POA: Diagnosis not present

## 2020-02-26 DIAGNOSIS — K5900 Constipation, unspecified: Secondary | ICD-10-CM | POA: Diagnosis not present

## 2020-02-26 MED FILL — MONTELUKAST SOD 10 MG TAB: 10 | 30 days supply | Qty: 30 | Fill #0

## 2020-02-26 MED FILL — CYCLOBENZAPRINE HCL 10 MG T: 10 | 30 days supply | Qty: 30 | Fill #0

## 2020-02-26 MED FILL — OMEPRAZOLE 40 MG CPDR: 40 | 90 days supply | Qty: 90 | Fill #1

## 2020-02-27 MED FILL — DULERA 200 MCG/5 MCG INH: 200-5 | 30 days supply | Qty: 13 | Fill #1

## 2020-03-02 ENCOUNTER — Telehealth: Payer: Self-pay | Admitting: Cardiovascular Disease

## 2020-03-02 ENCOUNTER — Emergency Department (HOSPITAL_COMMUNITY)
Admission: EM | Admit: 2020-03-02 | Discharge: 2020-03-02 | Disposition: A | Discharge status: Home / Self Care | Payer: 59 | Attending: Emergency Medicine | Admitting: Emergency Medicine

## 2020-03-02 ENCOUNTER — Other Ambulatory Visit: Payer: Self-pay

## 2020-03-02 ENCOUNTER — Emergency Department (HOSPITAL_COMMUNITY): Payer: 59

## 2020-03-02 DIAGNOSIS — R079 Chest pain, unspecified: Secondary | ICD-10-CM | POA: Insufficient documentation

## 2020-03-02 DIAGNOSIS — Z955 Presence of coronary angioplasty implant and graft: Secondary | ICD-10-CM | POA: Insufficient documentation

## 2020-03-02 DIAGNOSIS — R0600 Dyspnea, unspecified: Secondary | ICD-10-CM | POA: Diagnosis not present

## 2020-03-02 DIAGNOSIS — Z5321 Procedure and treatment not carried out due to patient leaving prior to being seen by health care provider: Secondary | ICD-10-CM | POA: Insufficient documentation

## 2020-03-02 LAB — CBC
HCT: 46.4 % (ref 39.0–52.0)
Hemoglobin: 15.7 g/dL (ref 13.0–17.0)
MCH: 29.3 pg (ref 26.0–34.0)
MCHC: 33.8 g/dL (ref 30.0–36.0)
MCV: 86.6 fL (ref 80.0–100.0)
Platelets: 262 10*3/uL (ref 150–400)
RBC: 5.36 MIL/uL (ref 4.22–5.81)
RDW: 14.6 % (ref 11.5–15.5)
WBC: 9.9 10*3/uL (ref 4.0–10.5)
nRBC: 0 % (ref 0.0–0.2)

## 2020-03-02 LAB — BASIC METABOLIC PANEL
Anion gap: 13 (ref 5–15)
BUN: 11 mg/dL (ref 8–23)
CO2: 18 mmol/L — ABNORMAL LOW (ref 22–32)
Calcium: 9.5 mg/dL (ref 8.9–10.3)
Chloride: 107 mmol/L (ref 98–111)
Creatinine, Ser: 0.97 mg/dL (ref 0.61–1.24)
GFR, Estimated: >60 mL/min (ref >60)
Glucose, Bld: 93 mg/dL (ref 70–99)
Potassium: 3.6 mmol/L (ref 3.5–5.1)
Sodium: 138 mmol/L (ref 135–145)

## 2020-03-02 LAB — TROPONIN I (HIGH SENSITIVITY)
Troponin I (High Sensitivity): 9 ng/L (ref <18)
Troponin I (High Sensitivity): 10 ng/L (ref <18)

## 2020-03-02 NOTE — Telephone Encounter (Signed)
Spoke with both Mr. And Mrs. Gassmann on speaker phone.  Pt states that he has been having intermittent chest pain for a few hours now, since about noon.  Pt states that the chest pain is accompanied with SOB.  Pt states that he took one SL nitroglycerin earlier today and that helped the chest pain and SOB.  Pt says that about 15 mins prior to their call to our office he has taken a second SL nitro and that it gave relief from the chest pain.  Pt states that he tried his albuterol inhaler earlier in the day to help with the SOB. Pt denies diaphoresis, nausea or vomiting, but does state that he feels weak and exhausted. Pt states that he has not missed any doses of any medications and is taking them all as directed.  Pt is still smoking 4-5 cigarettes a day but says that this is an improvement.  Prior to phone call pt's wife took his blood pressure and she reports 129/89 with HR- 94.    Per chart, pt was a STEMI in November 2021 when he had a stent placed in his mid LAD.  D/t pt's history and severity of symptoms advised pt and wife to proceed to the ED.  Pt and wife verbalize understanding. Instructed pt to let us know how things go.  Wife asked if this RN could give [facility] team a heads up.  Evening on call APP for [facility]; Rosaria Ferries, PA paged and given report on pt.

## 2020-03-02 NOTE — Telephone Encounter (Signed)
Pt c/o of Chest Pain: STAT if CP now or developed within 24 hours  1. Are you having CP right now?  Yes   2. Are you experiencing any other symptoms (ex. SOB, nausea, vomiting, sweating.  SOB   3. How long have you been experiencing CP? Around 11 or 12 today   4. Is your CP continuous or coming and going?  Coming and going    5. Have you taken Nitroglycerin?  Yes he has taken 2   Pt just had a stent  put in in nov.   ?

## 2020-03-02 NOTE — ED Notes (Signed)
Pt stated he was leaving did not want to wait.

## 2020-03-02 NOTE — ED Triage Notes (Signed)
Patient c/o chest pain and "gasping for air" stated he informed his PCP and was told to dc metoprolol at this time. Stated he is 30 days post stent placement. Endorsed pain relief from NTG taken x2.

## 2020-03-03 ENCOUNTER — Encounter: Payer: Self-pay | Admitting: Cardiovascular Disease

## 2020-03-03 ENCOUNTER — Telehealth (HOSPITAL_COMMUNITY): Payer: Self-pay

## 2020-03-03 ENCOUNTER — Other Ambulatory Visit: Payer: Self-pay | Admitting: Cardiovascular Disease

## 2020-03-03 ENCOUNTER — Ambulatory Visit: Payer: 59 | Admitting: Cardiovascular Disease

## 2020-03-03 VITALS — BP 120/78 | HR 84 | Ht 72.0 in | Wt 186.8 lb

## 2020-03-03 DIAGNOSIS — I2510 Atherosclerotic heart disease of native coronary artery without angina pectoris: Secondary | ICD-10-CM | POA: Diagnosis not present

## 2020-03-03 DIAGNOSIS — I700 Atherosclerosis of aorta: Secondary | ICD-10-CM | POA: Diagnosis not present

## 2020-03-03 DIAGNOSIS — Z720 Tobacco use: Secondary | ICD-10-CM | POA: Diagnosis not present

## 2020-03-03 DIAGNOSIS — R0602 Shortness of breath: Secondary | ICD-10-CM | POA: Insufficient documentation

## 2020-03-03 DIAGNOSIS — I10 Essential (primary) hypertension: Secondary | ICD-10-CM

## 2020-03-03 DIAGNOSIS — E7800 Pure hypercholesterolemia, unspecified: Secondary | ICD-10-CM | POA: Diagnosis not present

## 2020-03-03 DIAGNOSIS — R072 Precordial pain: Secondary | ICD-10-CM | POA: Diagnosis not present

## 2020-03-03 DIAGNOSIS — I2583 Coronary atherosclerosis due to lipid rich plaque: Secondary | ICD-10-CM

## 2020-03-03 HISTORY — DX: Shortness of breath: R06.02

## 2020-03-03 MED ORDER — PANTOPRAZOLE SODIUM 40 MG PO TBEC: 40.0000 mg | DELAYED_RELEASE_TABLET | Freq: Every day | ORAL | 3 refills | Status: DC

## 2020-03-03 MED ORDER — CLOPIDOGREL BISULFATE 75 MG PO TABS: ORAL_TABLET | ORAL | 3 refills | Status: DC

## 2020-03-03 MED FILL — CLOPIDOGREL 75 MG TABLET: 75 | 90 days supply | Qty: 93 | Fill #0

## 2020-03-03 MED FILL — PANTOPRAZOLE SOD DR 40 MG T: 40 | 90 days supply | Qty: 90 | Fill #0

## 2020-03-03 NOTE — Progress Notes (Signed)
Cardiology Office Note   Date:  03/03/2020   ID:  Peter Sims, Peter Sims [birth year], MRN [id number]  PCP:  [place] Chard, MD  Cardiologist:   Skeet Latch, MD   Chief Complaint  Patient presents with  . Follow-up    Chest pain, SOB     History of Present Illness: Peter Sims is a 70 y.o. male with CAD s/p anterior STEMI, hypertension, hyperlipidemia, tobacco abuse, bullous emphysema, COPD, and GERD here for follow up.  He was initially seen 12/2019 for the evaluation of chest pain at the request.  He was seen in the ED 11/2019 with precordial chest pain.  He also had pain in his back and neck at the time.  He had an episode of getting lightheaded and sweaty after having a bowel movement.  He felt lethargic and sweaty.  He also had slurred speech.  He called EMS and did orthostatic vitals that reportedly showed hypotension.  His blood pressure improved and they decided that he did not need to go to the hospital.  He called his PCP the following day and they recommended that he go to the emergency department for evaluation.  In the ED, high-sensitivity troponin revealed a pattern of 8-->22-->7.  EKG revealed sinus tachycardia to 112 bpm.  There were no acute ischemic changes.  Chest CT-A revealed pulmonary nodule, foci of atherosclerosis in the coronaries and aorta.  He was felt to be stable to be discharged and followed up with cardiology as an outpatient.  Mr. Sian recalls another similar episode that occurred when standing.  He was outside talking with a neighbor.  He then got a sharp pain in his chest.  He started walking into the kitchen and started seating profusely.  He was unable to speak to them.  He knew what he wanted to say but couldn't get the words out.  He nearly passed out.  The pain he describes was debilitating, sharp pain in the shoulder that felt like a spasm.  He has experienced pain like this other times when he turns too quickly.    At his initial  appointment he was referred for coronary CT-A.  However before he could have this procedure he presented back to the hospital with chest pain.  Initial EKG was not consistent with STEMI.  High-sensitivity troponin was elevated to the 80s and then increased to 106.  He had an acute episode of chest pain overnight and was taken to the Cath Lab.  He developed an anterior S elevation MI and had PCI of the mid LAD.  He otherwise had mild to moderate lesions that were medically managed.  He was started on metoprolol and ticagrelor.   For the last wee he has noted pain in his L chest.  It is constant but flares up sporadically.  It feels like nagging pain.  Yesterday it was more intense for an hour so he went to the ED.  Symptoms did improve somewhat with nitroglycerin.  His symptoms have never been exertional.  In the ED his EKG showed sinus arrhythmia.  Cardiac enzymes were negative.  He has also been feeling short of breath and gasping for air at times.  He has been feeling fatigued with minimal exertion but doesn't have any chest pain.  He notes that he was told to reduce metoprolol to once a day when he saw his PCP.  He has been working to change his diet.  He plans to start weaning from caffeine tomorrow.  He currently drinks 3 to 4 cups of coffee daily.  He is down to smoking about 6 cigarettes/day and still using a nicotine patch.   Past Medical History:  Diagnosis Date  . Arthritis   . Bullous emphysema (HCC)    Severe Bilateral  . Cocaine abuse ([place])   . Colitis    with bleeding  . COPD (chronic obstructive pulmonary disease) ([place])   . Dyspnea   . GERD (gastroesophageal reflux disease)   . Headache    sinusitis  . Hypertension   . LBP (low back pain)    now chronic  . Lumbago   . Lung mass    Right Upper Lobe pleural based mass, negative  on PET Scan  . Neck pain    nerve pain   . Pneumonia    hx  . S/P angioplasty with stent 01/13/20 emergent DES to mLAD  01/15/2020  . Shortness of  breath 03/03/2020  . Spine pain    nerve  . Therapeutic drug monitoring   . Tobacco abuse     Past Surgical History:  Procedure Laterality Date  . COLONOSCOPY    . CORONARY/GRAFT ACUTE MI REVASCULARIZATION N/A 01/14/2020   Procedure: Coronary/Graft Acute MI Revascularization;  Surgeon: Sherren Mocha, MD;  Location: [facility];  Service: Cardiovascular;  Laterality: N/A;  . LEFT HEART CATH AND CORONARY ANGIOGRAPHY N/A 01/14/2020   Procedure: LEFT HEART CATH AND CORONARY ANGIOGRAPHY;  Surgeon: Sherren Mocha, MD;  Location: [facility];  Service: Cardiovascular;  Laterality: N/A;  . LUMBAR LAMINECTOMY/DECOMPRESSION MICRODISCECTOMY Right 05/03/2016   Procedure: MICRODISCECTOMY LUMBAR FIVE- SACRAL ONE RIGHT;  Surgeon: Ashok Pall, MD;  Location: [facility];  Service: Neurosurgery;  Laterality: Right;  . NO PAST SURGERIES       Current Outpatient Medications  Medication Sig Dispense Refill  . acetaminophen (TYLENOL) 325 MG tablet Take 2 tablets (650 mg total) by mouth every 4 (four) hours as needed for headache or mild pain.    Marland Kitchen albuterol (PROVENTIL HFA;VENTOLIN HFA) 108 (90 BASE) MCG/ACT inhaler Inhale 2 puffs into the lungs every 6 (six) hours as needed for wheezing or shortness of breath.     Marland Kitchen aspirin 81 MG chewable tablet Chew 1 tablet (81 mg total) by mouth daily.    Marland Kitchen atorvastatin (LIPITOR) 80 MG tablet Take 1 tablet (80 mg total) by mouth daily. 30 tablet 6  . buPROPion (WELLBUTRIN XL) 150 MG 24 hr tablet Take 150 mg by mouth as needed.    . Carboxymethylcellul-Glycerin (CLEAR EYES FOR DRY EYES OP) Apply 1 drop to eye daily as needed (for dry eyes).    . clopidogrel (PLAVIX) 75 MG tablet TAKE 300 MG TIMES 1 DOSE TOMORROW AND THEN TAKE 1 TABLET DAILY 93 tablet 3  . cyclobenzaprine (FLEXERIL) 10 MG tablet TAKE 1 TABLET (10 MG TOTAL) BY MOUTH AS NEEDED FOR MUSCLE SPASMS. 30 tablet 0  . diclofenac Sodium (VOLTAREN) 1 % GEL Apply 2 g topically 2 (two) times daily.    Marland Kitchen  ipratropium-albuterol (DUONEB) 0.5-2.5 (3) MG/3ML SOLN INHALE 1 VIAL (3MLS) USING NEBULIZER 4 TIMES PER DAY 270 mL 11  . losartan (COZAAR) 50 MG tablet TAKE 1 TABLET BY MOUTH DAILY 90 tablet 2  . Magnesium 400 MG CAPS Take 400 mg by mouth daily. 30 capsule 1  . metoprolol tartrate (LOPRESSOR) 25 MG tablet Take 0.5 tablets (12.5 mg total) by mouth 2 (two) times daily. (Patient taking differently: Take 12.5 mg by mouth daily.) 30 tablet  6  . mometasone-formoterol (DULERA) 200-5 MCG/ACT AERO Inhale 2 puffs into the lungs 2 (two) times daily. 1 each 3  . montelukast (SINGULAIR) 10 MG tablet TAKE 1 TABLET BY MOUTH EVERY DAY IN THE EVENING 30 tablet 3  . nicotine (NICODERM CQ - DOSED IN MG/24 HR) 7 mg/24hr patch Place 1 patch (7 mg total) onto the skin at bedtime. 28 patch 0  . nitroGLYCERIN (NITROSTAT) 0.4 MG SL tablet Place 1 tablet (0.4 mg total) under the tongue every 5 (five) minutes as needed for chest pain. 25 tablet 4  . omeprazole (PRILOSEC) 40 MG capsule TAKE 1 CAPSULE BY MOUTH EVERY DAY BEFORE A MEAL 90 capsule 2  . pregabalin (LYRICA) 50 MG capsule TAKE 1 CAPSULE BY MOUTH TWICE DAILY 60 capsule 3  . Respiratory Therapy Supplies (FLUTTER) DEVI Use as directed 1 each 0  . tamsulosin (FLOMAX) 0.4 MG CAPS capsule Take 0.4 mg by mouth daily.     No current facility-administered medications for this visit.    Allergies:   Penicillins    Social History:  The patient  reports that he has been smoking cigarettes. He has a 22.50 pack-year smoking history. He has never used smokeless tobacco. He reports current alcohol use. He reports current drug use. Drugs: Cocaine and Marijuana.   Family History:  The patient's family history includes Breast cancer in his sister; Cancer in his father; Emphysema in his mother; Heart failure in his sister; Lung cancer in his father. He was adopted.    ROS:  Please see the history of present illness.   Otherwise, review of systems are positive for none.   All  other systems are reviewed and negative.    PHYSICAL EXAM: VS:  BP 120/78   Pulse 84   Ht 6' (1.829 m)   Wt 186 lb 12.8 oz (84.7 kg)   SpO2 98%   BMI 25.33 kg/m  , BMI Body mass index is 25.33 kg/m. GENERAL:  Well appearing HEENT: Pupils equal round and reactive, fundi not visualized, oral mucosa unremarkable NECK:  No jugular venous distention, waveform within normal limits, carotid upstroke brisk and symmetric, no bruits LUNGS:  Clear to auscultation bilaterally HEART:  RRR.  PMI not displaced or sustained,S1 and S2 within normal limits, no S3, no S4, no clicks, no rubs, no murmurs ABD:  Flat, positive bowel sounds normal in frequency in pitch, no bruits, no rebound, no guarding, no midline pulsatile mass, no hepatomegaly, no splenomegaly EXT:  2 plus pulses throughout, no edema, no cyanosis no clubbing SKIN:  No rashes no nodules NEURO:  Cranial nerves II through XII grossly intact, motor grossly intact throughout PSYCH:  Cognitively intact, oriented to person place and time  EKG:  EKG is not ordered today. 01/20/2020: Sinus arrhythmia.  Rate 82 bpm. 03/03/2019: Sinus arrhythmia.  Rate 73 bpm.  Nonspecific ST abnormalities.  LHC 01/14/20:  1.  Severe mid LAD stenosis (culprit lesion) treated successfully with PCI using a 3.5 x 16 mm Synergy DES 2.  Mild nonobstructive proximal LAD, mid circumflex, OM 2, and proximal RCA stenoses 3.  Normal LV systolic function with normal LVEDP  Recommendations: Aggressive medical therapy for secondary risk reduction.  Tobacco cessation.  The patient is loaded with ticagrelor 180 mg in the Cath Lab.  He is started on a high intensity statin drug and a beta-blocker.  Discussed cardiac catheterization findings with patient and his wife.  If no complications arise, should be eligible for hospital discharge within 24 hours.  Recent Labs: 01/13/2020: ALT 28 03/02/2020: BUN 11; Creatinine, Ser 0.97; Hemoglobin 15.7; Platelets 262; Potassium 3.6;  Sodium 138    Lipid Panel    Component Value Date/Time   CHOL 164 01/14/2020 0128   CHOL 227 (H) 12/02/2019 1219   TRIG 62 01/14/2020 0128   HDL 61 01/14/2020 0128   HDL 53 12/02/2019 1219   CHOLHDL 2.7 01/14/2020 0128   VLDL 12 01/14/2020 0128   LDLCALC 91 01/14/2020 0128   LDLCALC 153 (H) 12/02/2019 1219      Wt Readings from Last 3 Encounters:  03/03/20 186 lb 12.8 oz (84.7 kg)  02/18/20 187 lb 3.2 oz (84.9 kg)  01/20/20 191 lb (86.6 kg)      ASSESSMENT AND PLAN:  # CAD s/p STEMI and LAD PCI: # Hyperlipidemia: Mr. Rex Kras has been doing well since his recent hospitalization.  I suspect his shortness of breath is due to the ticagrelor.  His symptoms seem to be getting worse.  We will switch ticagrelor to clopidogrel tomorrow.  We will take an evening dose of ticagrelor tonight.  Tomorrow he will take 3 mg of clopidogrel in the morning and start 75 mg daily the following day.  Switch omeprazole to pantoprazole.  Continue metoprolol 25 mg twice daily.  If he continues to have fatigue we can consider switching to an alternative beta-blocker.  Continue aspirin, atorvastatin, and metoprolol.  His chest pain is atypical and lasted for several days.  There is no exertional component.  Cardiac enzymes were negative and EKG was unremarkable.  No plan for ischemic evaluation at this time.  # Near syncope:  Mr. Marasigan's episode sound like classic vasovagal hypotension.  No recent symptoms.  # Tobacco abuse:  Recommend cessation.  He will continue to cut back.  He does not have any additional assistance.  Continue nicotine replacement patch.  # Hypertension:  BP stable on l metoprolol and losartan.  Current medicines are reviewed at length with the patient today.  The patient does not have concerns regarding medicines.  The following changes have been made: None  Labs/ tests ordered today include:   No orders of the defined types were placed in this  encounter.    Disposition:   FU with Eryck Negron C. Oval Linsey, MD, [facility] in 6 months.    Signed, Ac Colan C. Oval Linsey, MD, [facility]  03/03/2020 4:39 PM    [facility] the last started

## 2020-03-03 NOTE — Telephone Encounter (Signed)
Discussed with Dr Oval Linsey and ok to see patient this afternoon in [facility] Patient aware of time

## 2020-03-03 NOTE — Telephone Encounter (Signed)
Patient scheduled for next Wednesday at 11:40 with Dr. Oval Linsey. Please advise if it is OK to wait this long. Patient's wife states he continues to have active chest pain and does not want him to wait this long.

## 2020-03-03 NOTE — Telephone Encounter (Signed)
Pt insurance is active and benefits verified through [facility]. Co-pay $0.00, DED $0.00/$0.00 met, out of pocket $0.00/$0.00 met, co-insurance 0%. No pre-authorization required. Passport, 03/03/20 @ 3:01PM, [id number]

## 2020-03-03 NOTE — Telephone Encounter (Signed)
Patient went to ED but left w/o being seen.  Hs-troponin was negative.  Please schedule with me or APP.

## 2020-03-03 NOTE — Patient Instructions (Addendum)
Medication Instructions:  STOP BRILINTA AFTER TONIGHTS DOSE   INCREASE YOUR METOPROLOL TO TWICE A DAY   START CLOPIDOGREL 75 MG 4 TABLETS TOMORROW MORNING AND THEN 1 TABLET DAILY STARTING Friday   STOP OMEPRAZOLE   START PANTOPRAZOLE 40 MG DAILY   *If you need a refill on your cardiac medications before your next appointment, please call your pharmacy*  Lab Work: NONE  Testing/Procedures: NONE  Follow-Up: At [facility], you and your health needs are our priority.  As part of our continuing mission to provide you with exceptional heart care, we have created designated Provider Care Teams.  These Care Teams include your primary Cardiologist (physician) and Advanced Practice Providers (APPs -  Physician Assistants and Nurse Practitioners) who all work together to provide you with the care you need, when you need it.  We recommend signing up for the patient portal called "MyChart".  Sign up information is provided on this After Visit Summary.  MyChart is used to connect with patients for Virtual Visits (Telemedicine).  Patients are able to view lab/test results, encounter notes, upcoming appointments, etc.  Non-urgent messages can be sent to your provider as well.   To learn more about what you can do with MyChart, go to [email or web address].    Your next appointment:   KEEP YOUR  VIRTUAL VISIT ON 2/28 WITH DR [facility]

## 2020-03-10 ENCOUNTER — Ambulatory Visit: Payer: 59 | Admitting: Cardiovascular Disease

## 2020-03-16 ENCOUNTER — Other Ambulatory Visit: Payer: Self-pay

## 2020-03-16 ENCOUNTER — Encounter (HOSPITAL_COMMUNITY)
Admission: RE | Admit: 2020-03-16 | Discharge: 2020-03-16 | Disposition: A | Discharge status: Home / Self Care | Payer: 59 | Source: Ambulatory Visit | Attending: Cardiovascular Disease | Admitting: Cardiovascular Disease

## 2020-03-16 DIAGNOSIS — Z95820 Peripheral vascular angioplasty status with implants and grafts: Secondary | ICD-10-CM | POA: Insufficient documentation

## 2020-03-16 DIAGNOSIS — I2102 ST elevation (STEMI) myocardial infarction involving left anterior descending coronary artery: Secondary | ICD-10-CM | POA: Insufficient documentation

## 2020-03-16 NOTE — Progress Notes (Signed)
Cardiac Rehab Telephone Note:  Successful telephone encounter to Peter Sims to confirm Cardiac Rehab orientation appointment for 03/23/20 at 8:30 am. Nursing assessment completed. Patient questions answered. Instructions for appointment provided. Patient screening for Covid-19 positive for contact however patient states his adult son came into the home masked long enough to tell patient he was sick and left. This was 2 weeks ago. Patient had less than 15 min contact time and both remained masked. Patient has been vaccinated with booster and at this time has no symptoms. Patient will be 3 weeks out from contact at time of CR orientation appointment. Patient is instructed to call CR if symptoms develop and not to present to orientation appointment.  Deron Poole E. Rollene Rotunda RN, BSN [place]. [facility]  Cardiac and Pulmonary Rehabilitation Phone: [phone] Fax: [phone]

## 2020-03-18 ENCOUNTER — Other Ambulatory Visit: Payer: Medicare Other

## 2020-03-18 ENCOUNTER — Telehealth: Payer: Self-pay | Admitting: Cardiovascular Disease

## 2020-03-18 ENCOUNTER — Other Ambulatory Visit: Payer: Self-pay | Admitting: Gastroenterology

## 2020-03-18 ENCOUNTER — Other Ambulatory Visit: Payer: Self-pay

## 2020-03-18 DIAGNOSIS — R1110 Vomiting, unspecified: Secondary | ICD-10-CM | POA: Diagnosis not present

## 2020-03-18 DIAGNOSIS — K5732 Diverticulitis of large intestine without perforation or abscess without bleeding: Secondary | ICD-10-CM | POA: Diagnosis not present

## 2020-03-18 DIAGNOSIS — K5904 Chronic idiopathic constipation: Secondary | ICD-10-CM | POA: Diagnosis not present

## 2020-03-18 DIAGNOSIS — R1012 Left upper quadrant pain: Secondary | ICD-10-CM

## 2020-03-18 DIAGNOSIS — K219 Gastro-esophageal reflux disease without esophagitis: Secondary | ICD-10-CM | POA: Diagnosis not present

## 2020-03-18 DIAGNOSIS — R197 Diarrhea, unspecified: Secondary | ICD-10-CM | POA: Diagnosis not present

## 2020-03-18 DIAGNOSIS — R1033 Periumbilical pain: Secondary | ICD-10-CM | POA: Diagnosis not present

## 2020-03-18 DIAGNOSIS — R112 Nausea with vomiting, unspecified: Secondary | ICD-10-CM | POA: Diagnosis not present

## 2020-03-18 NOTE — Telephone Encounter (Signed)
Spoke to patient's wife.She stated husband has been having lower abdominal cramps,vomiting and diarrhea today.Stated she called Dr.Mann and he has appointment with her this afternoon.Advised I will make Dr.[place] aware.

## 2020-03-18 NOTE — Telephone Encounter (Signed)
Spoke to patient's wife. She states that this issue is more GI related rather than cardiac related but she states her husband was having lightheadedness, sweating and nausea while going to the bathroom. Denies any dizziness, chest pain or SOB. BP was 121/75 and HR 60. He was having stomach cramps and diarrhea. She states these symptoms are similar to the ones he was having prior to having his stents placed. Waiting to hear back from Dr. Oval Linsey and have also called his GI doctor.

## 2020-03-19 ENCOUNTER — Other Ambulatory Visit (HOSPITAL_COMMUNITY): Payer: Self-pay | Admitting: Gastroenterology

## 2020-03-19 ENCOUNTER — Other Ambulatory Visit: Payer: Self-pay

## 2020-03-19 ENCOUNTER — Encounter (HOSPITAL_COMMUNITY): Payer: Self-pay

## 2020-03-19 ENCOUNTER — Ambulatory Visit (HOSPITAL_COMMUNITY)
Admission: RE | Admit: 2020-03-19 | Discharge: 2020-03-19 | Disposition: A | Discharge status: Home / Self Care | Payer: 59 | Source: Ambulatory Visit | Attending: Gastroenterology | Admitting: Gastroenterology

## 2020-03-19 DIAGNOSIS — K449 Diaphragmatic hernia without obstruction or gangrene: Secondary | ICD-10-CM | POA: Diagnosis not present

## 2020-03-19 DIAGNOSIS — K5730 Diverticulosis of large intestine without perforation or abscess without bleeding: Secondary | ICD-10-CM | POA: Diagnosis not present

## 2020-03-19 DIAGNOSIS — K529 Noninfective gastroenteritis and colitis, unspecified: Secondary | ICD-10-CM | POA: Diagnosis not present

## 2020-03-19 DIAGNOSIS — R1012 Left upper quadrant pain: Secondary | ICD-10-CM | POA: Insufficient documentation

## 2020-03-19 DIAGNOSIS — K6389 Other specified diseases of intestine: Secondary | ICD-10-CM | POA: Diagnosis not present

## 2020-03-19 MED ORDER — IOHEXOL 300 MG/ML  SOLN
100.0000 mL | Freq: Once | INTRAMUSCULAR | Status: AC | PRN
  Administered 2020-03-19: 100 mL via INTRAVENOUS

## 2020-03-19 MED FILL — CIPROFLOXACIN HCL 500 MG TA: 500 | 30 days supply | Qty: 60 | Fill #0

## 2020-03-19 MED FILL — FAMOTIDINE 40 MG TABLET: 40 | 30 days supply | Qty: 60 | Fill #0

## 2020-03-19 MED FILL — metroNIDAZOLE 250 MG TABS: 250 | 30 days supply | Qty: 90 | Fill #0

## 2020-03-20 LAB — SARS-COV-2, NAA 2 DAY TAT

## 2020-03-20 LAB — NOVEL CORONAVIRUS, NAA: SARS-CoV-2, NAA: NOT DETECTED

## 2020-03-22 ENCOUNTER — Telehealth (HOSPITAL_COMMUNITY): Payer: Self-pay | Admitting: Internal Medicine

## 2020-03-22 ENCOUNTER — Encounter: Payer: Self-pay | Admitting: Internal Medicine

## 2020-03-22 NOTE — Progress Notes (Incomplete)
I,[name],acting as a Education administrator for [name], MD.,have documented all relevant documentation on the behalf of [name], MD,as directed by  [name], MD while in the presence of [name], MD.  This visit occurred during the SARS-CoV-2 public health emergency.  Safety protocols were in place, including screening questions prior to the visit, additional usage of staff PPE, and extensive cleaning of exam room while observing appropriate contact time as indicated for disinfecting solutions.  Subjective:     [name] , male    DOB: [birth year] , 70 y.o.   MRN: [id number]   Chief Complaint  Patient presents with  . Abdominal Pain  . Constipation    HPI  The patient is here for evaluation of constipation and abdominal discomfort. He reports his sx started over a week ago. He has h/o constipation. Seems that it has become more difficult for him to stay regular. Admits he is not getting much activity. Has tried OTC remedies with minimal relief. He denies n/v/d. He reports his discomfort is sometimes relieved with a bowel movement.   Abdominal Pain This is a recurrent problem. The current episode started in the past 7 days. The onset quality is gradual. The pain is located in the suprapubic region. The pain is at a severity of 5/10. Associated symptoms include constipation.     Past Medical History:  Diagnosis Date  . Arthritis   . Bullous emphysema (HCC)    Severe Bilateral  . Cocaine abuse ([place])   . Colitis    with bleeding  . COPD (chronic obstructive pulmonary disease) ([place])   . Dyspnea   . GERD (gastroesophageal reflux disease)   . Headache    sinusitis  . Hypertension   . LBP (low back pain)    now chronic  . Lumbago   . Lung mass    Right Upper Lobe pleural based mass, negative  on PET Scan  . Neck pain    nerve pain   . Pneumonia    hx  . S/P angioplasty with stent 01/13/20 emergent DES to mLAD  01/15/2020  .  Shortness of breath 03/03/2020  . Spine pain    nerve  . Therapeutic drug monitoring   . Tobacco abuse      Family History  Adopted: Yes  Problem Relation Age of Onset  . Cancer Father   . Lung cancer Father   . Heart failure Sister   . Breast cancer Sister   . Emphysema Mother      Current Outpatient Medications:  .  acetaminophen (TYLENOL) 325 MG tablet, Take 2 tablets (650 mg total) by mouth every 4 (four) hours as needed for headache or mild pain., Disp: , Rfl:  .  albuterol (PROVENTIL HFA;VENTOLIN HFA) 108 (90 BASE) MCG/ACT inhaler, Inhale 2 puffs into the lungs every 6 (six) hours as needed for wheezing or shortness of breath. , Disp: , Rfl:  .  aspirin 81 MG chewable tablet, Chew 1 tablet (81 mg total) by mouth daily., Disp: , Rfl:  .  atorvastatin (LIPITOR) 80 MG tablet, Take 1 tablet (80 mg total) by mouth daily., Disp: 30 tablet, Rfl: 6 .  buPROPion (WELLBUTRIN XL) 150 MG 24 hr tablet, Take 150 mg by mouth as needed., Disp: , Rfl:  .  Carboxymethylcellul-Glycerin (CLEAR EYES FOR DRY EYES OP), Apply 1 drop to eye daily as needed (for dry eyes)., Disp: , Rfl:  .  diclofenac Sodium (VOLTAREN) 1 %  GEL, Apply 2 g topically 2 (two) times daily., Disp: , Rfl:  .  ipratropium-albuterol (DUONEB) 0.5-2.5 (3) MG/3ML SOLN, INHALE 1 VIAL (3MLS) USING NEBULIZER 4 TIMES PER DAY, Disp: 270 mL, Rfl: 11 .  losartan (COZAAR) 50 MG tablet, TAKE 1 TABLET BY MOUTH DAILY, Disp: 90 tablet, Rfl: 2 .  Magnesium 400 MG CAPS, Take 400 mg by mouth daily., Disp: 30 capsule, Rfl: 1 .  metoprolol tartrate (LOPRESSOR) 25 MG tablet, Take 0.5 tablets (12.5 mg total) by mouth 2 (two) times daily. (Patient taking differently: Take 12.5 mg by mouth daily.), Disp: 30 tablet, Rfl: 6 .  mometasone-formoterol (DULERA) 200-5 MCG/ACT AERO, Inhale 2 puffs into the lungs 2 (two) times daily., Disp: 1 each, Rfl: 3 .  nicotine (NICODERM CQ - DOSED IN MG/24 HR) 7 mg/24hr patch, Place 1 patch (7 mg total) onto the skin at  bedtime., Disp: 28 patch, Rfl: 0 .  nitroGLYCERIN (NITROSTAT) 0.4 MG SL tablet, Place 1 tablet (0.4 mg total) under the tongue every 5 (five) minutes as needed for chest pain., Disp: 25 tablet, Rfl: 4 .  pregabalin (LYRICA) 50 MG capsule, TAKE 1 CAPSULE BY MOUTH TWICE DAILY, Disp: 60 capsule, Rfl: 3 .  Respiratory Therapy Supplies (FLUTTER) DEVI, Use as directed, Disp: 1 each, Rfl: 0 .  tamsulosin (FLOMAX) 0.4 MG CAPS capsule, Take 0.4 mg by mouth daily., Disp: , Rfl:  .  clopidogrel (PLAVIX) 75 MG tablet, TAKE 300 MG TIMES 1 DOSE TOMORROW AND THEN TAKE 1 TABLET DAILY, Disp: 93 tablet, Rfl: 3 .  cyclobenzaprine (FLEXERIL) 10 MG tablet, TAKE 1 TABLET (10 MG TOTAL) BY MOUTH AS NEEDED FOR MUSCLE SPASMS., Disp: 30 tablet, Rfl: 0 .  montelukast (SINGULAIR) 10 MG tablet, TAKE 1 TABLET BY MOUTH EVERY DAY IN THE EVENING, Disp: 30 tablet, Rfl: 3 .  pantoprazole (PROTONIX) 40 MG tablet, Take 1 tablet (40 mg total) by mouth daily., Disp: 90 tablet, Rfl: 3   Allergies  Allergen Reactions  . Penicillins Anaphylaxis and Hives    Has patient had a PCN reaction causing immediate rash, facial/tongue/throat swelling, SOB or lightheadedness with hypotension: No Has patient had a PCN reaction causing severe rash involving mucus membranes or skin necrosis: No Has patient had a PCN reaction that required hospitalization No Has patient had a PCN reaction occurring within the last 10 years: No If all of the above answers are "NO", then may proceed with Cephalosporin use.       Review of Systems  Constitutional: Negative.   Respiratory: Negative.   Cardiovascular: Negative.   Gastrointestinal: Positive for abdominal pain and constipation.  Genitourinary: Positive for urgency.  Psychiatric/Behavioral: Negative.   All other systems reviewed and are negative.    Today's Vitals   02/18/20 1526  BP: 114/78  Pulse: 100  Temp: 97.7 F (36.5 C)  TempSrc: Oral  SpO2: 98%  Weight: 187 lb 3.2 oz (84.9 kg)   Height: 6' (1.829 m)  PainSc: 4   PainLoc: Abdomen   Body mass index is 25.39 kg/m.  Wt Readings from Last 3 Encounters:  03/03/20 186 lb 12.8 oz (84.7 kg)  02/18/20 187 lb 3.2 oz (84.9 kg)  01/20/20 191 lb (86.6 kg)   Objective:  Physical Exam Vitals and nursing note reviewed.  Constitutional:      Appearance: Normal appearance. He is well-developed.  HENT:     Head: Normocephalic and atraumatic.  Cardiovascular:     Rate and Rhythm: Normal rate and regular rhythm.  Heart sounds: Normal heart sounds.  Pulmonary:     Breath sounds: Normal breath sounds.  Abdominal:     General: Abdomen is protuberant. Bowel sounds are normal.     Palpations: Abdomen is soft.     Tenderness: There is abdominal tenderness in the suprapubic area. There is no right CVA tenderness or left CVA tenderness.  Skin:    General: Skin is warm.  Neurological:     General: No focal deficit present.     Mental Status: He is alert and oriented to person, place, and time.         Assessment And Plan:     1. Chronic constipation Comments: Chronic. He has had bowel movement this morning. This is likely exacerbating his abdominal discomfort. I will consider trial of Linzess, 72mg  once daily.   2. Suprapubic abdominal pain Comments: I will check urinalysis today.  - POCT Urinalysis Dipstick (81002)  3. Microscopic hematuria Comments: I will check urine culture.  - Culture, Urine  4. Coronary atherosclerosis due to lipid rich plaque Comments: He is encouraged to follow a heart healthy lifestyle. importance of statin/med compliance was discussed with the patient.   5. Tobacco use disorder Comments: He has decreased down to 6 cigs/day. He is encouraged to cut back further and eventually quit. I hope we can wean him completely w/in six weeks. He is advised to decrease number of cigs smoked per day by one cig per week.   6. Adult BMI 25.0-25.9 kg/sq m Comments: His BMI is stable for his  demographic.    Patient was given opportunity to ask questions. Patient verbalized understanding of the plan and was able to repeat key elements of the plan. All questions were answered to their satisfaction.  [name], MD   I, [name], MD, have reviewed all documentation for this visit. The documentation on 03/22/20 for the exam, diagnosis, procedures, and orders are all accurate and complete.  THE PATIENT IS ENCOURAGED TO PRACTICE SOCIAL DISTANCING DUE TO THE COVID-19 PANDEMIC.

## 2020-03-23 ENCOUNTER — Ambulatory Visit (HOSPITAL_COMMUNITY): Payer: 59

## 2020-03-24 MED FILL — NITROGLYCERIN 0.4 MG TAB SL: 0.4 | 5 days supply | Qty: 25 | Fill #1

## 2020-03-26 ENCOUNTER — Telehealth (HOSPITAL_COMMUNITY): Payer: Self-pay

## 2020-03-26 NOTE — Telephone Encounter (Signed)
Pt called to reschedule his cardiac rehab pt will come in for orientation on 04/22/2020@8 :00am and start the 8:45am exercise class. Mailed packet.

## 2020-03-29 ENCOUNTER — Ambulatory Visit (HOSPITAL_COMMUNITY): Payer: 59

## 2020-03-29 ENCOUNTER — Encounter: Payer: 59 | Admitting: Internal Medicine

## 2020-03-31 ENCOUNTER — Ambulatory Visit (HOSPITAL_COMMUNITY): Payer: 59

## 2020-04-01 DIAGNOSIS — K5904 Chronic idiopathic constipation: Secondary | ICD-10-CM | POA: Diagnosis not present

## 2020-04-01 DIAGNOSIS — K5730 Diverticulosis of large intestine without perforation or abscess without bleeding: Secondary | ICD-10-CM | POA: Diagnosis not present

## 2020-04-01 DIAGNOSIS — K219 Gastro-esophageal reflux disease without esophagitis: Secondary | ICD-10-CM | POA: Diagnosis not present

## 2020-04-02 ENCOUNTER — Ambulatory Visit (HOSPITAL_COMMUNITY): Payer: 59

## 2020-04-05 ENCOUNTER — Ambulatory Visit (HOSPITAL_COMMUNITY): Payer: 59

## 2020-04-06 ENCOUNTER — Telehealth (HOSPITAL_COMMUNITY): Payer: Self-pay | Admitting: Pharmacist

## 2020-04-06 NOTE — Telephone Encounter (Signed)
Cardiac Rehab Medication Review by a Pharmacist  Does the patient  feel that his/her medications are working for him/her?  yes  Has the patient been experiencing any side effects to the medications prescribed?  no  Does the patient measure his/her own blood pressure or blood glucose at home?   Patient reports BP: 450/38-88K recently  Doesn't take blood glucose   Does the patient have any problems obtaining medications due to transportation or finances?    No issues   Pt reports no changes in medication list from the review that was completed in January.   Wilson Singer, PharmD PGY1 Pharmacy Resident 04/06/2020 11:01 AM

## 2020-04-07 ENCOUNTER — Ambulatory Visit (HOSPITAL_COMMUNITY): Payer: 59

## 2020-04-08 ENCOUNTER — Ambulatory Visit: Payer: 59 | Admitting: Cardiovascular Disease

## 2020-04-09 ENCOUNTER — Ambulatory Visit (HOSPITAL_COMMUNITY): Payer: 59

## 2020-04-12 ENCOUNTER — Ambulatory Visit (HOSPITAL_COMMUNITY): Payer: 59

## 2020-04-14 ENCOUNTER — Ambulatory Visit (HOSPITAL_COMMUNITY): Payer: 59

## 2020-04-16 ENCOUNTER — Ambulatory Visit (HOSPITAL_COMMUNITY): Payer: 59

## 2020-04-19 ENCOUNTER — Ambulatory Visit (HOSPITAL_COMMUNITY): Payer: 59

## 2020-04-21 ENCOUNTER — Telehealth (HOSPITAL_COMMUNITY): Payer: Self-pay | Admitting: *Deleted

## 2020-04-21 ENCOUNTER — Ambulatory Visit (HOSPITAL_COMMUNITY): Payer: 59

## 2020-04-22 ENCOUNTER — Encounter (HOSPITAL_COMMUNITY): Payer: Self-pay

## 2020-04-22 ENCOUNTER — Other Ambulatory Visit: Payer: Self-pay

## 2020-04-22 ENCOUNTER — Ambulatory Visit (HOSPITAL_COMMUNITY)
Admission: RE | Admit: 2020-04-22 | Discharge: 2020-04-22 | Disposition: A | Discharge status: Home / Self Care | Payer: 59 | Source: Ambulatory Visit | Attending: Internal Medicine | Admitting: Internal Medicine

## 2020-04-22 ENCOUNTER — Encounter (HOSPITAL_COMMUNITY)
Admission: RE | Admit: 2020-04-22 | Discharge: 2020-04-22 | Disposition: A | Discharge status: Home / Self Care | Payer: 59 | Source: Ambulatory Visit | Attending: Cardiovascular Disease | Admitting: Cardiovascular Disease

## 2020-04-22 VITALS — BP 104/68 | Ht 71.0 in | Wt 186.3 lb

## 2020-04-22 DIAGNOSIS — Z955 Presence of coronary angioplasty implant and graft: Secondary | ICD-10-CM | POA: Diagnosis not present

## 2020-04-22 DIAGNOSIS — I2102 ST elevation (STEMI) myocardial infarction involving left anterior descending coronary artery: Secondary | ICD-10-CM | POA: Diagnosis not present

## 2020-04-22 HISTORY — DX: Atherosclerotic heart disease of native coronary artery without angina pectoris: I25.10

## 2020-04-22 NOTE — Progress Notes (Signed)
Cardiac Individual Treatment Plan  Patient Details  Name: Peter Sims MRN: [id number] Date of Birth: 1950-07-10 Referring Provider:   Flowsheet Row CARDIAC REHAB PHASE II ORIENTATION from 04/22/2020 in [facility]  Referring Provider Skeet Latch MD      Initial Encounter Date:  [place] from 04/22/2020 in [facility]  Date 04/22/20      Visit Diagnosis: STEMI 01/14/20   S/P DES LAD 01/14/20  Patient's Home Medications on Admission:  Current Outpatient Medications:  .  acetaminophen (TYLENOL) 325 MG tablet, Take 2 tablets (650 mg total) by mouth every 4 (four) hours as needed for headache or mild pain., Disp: , Rfl:  .  albuterol (PROVENTIL HFA;VENTOLIN HFA) 108 (90 BASE) MCG/ACT inhaler, Inhale 2 puffs into the lungs every 6 (six) hours as needed for wheezing or shortness of breath. , Disp: , Rfl:  .  aspirin 81 MG chewable tablet, Chew 1 tablet (81 mg total) by mouth daily., Disp: , Rfl:  .  atorvastatin (LIPITOR) 80 MG tablet, Take 1 tablet (80 mg total) by mouth daily., Disp: 30 tablet, Rfl: 6 .  buPROPion (WELLBUTRIN XL) 150 MG 24 hr tablet, Take 150 mg by mouth as needed., Disp: , Rfl:  .  Carboxymethylcellul-Glycerin (CLEAR EYES FOR DRY EYES OP), Apply 1 drop to eye daily as needed (for dry eyes)., Disp: , Rfl:  .  clopidogrel (PLAVIX) 75 MG tablet, TAKE 300 MG TIMES 1 DOSE TOMORROW AND THEN TAKE 1 TABLET DAILY, Disp: 93 tablet, Rfl: 3 .  cyclobenzaprine (FLEXERIL) 10 MG tablet, TAKE 1 TABLET (10 MG TOTAL) BY MOUTH AS NEEDED FOR MUSCLE SPASMS., Disp: 30 tablet, Rfl: 0 .  diclofenac Sodium (VOLTAREN) 1 % GEL, Apply 2 g topically 2 (two) times daily., Disp: , Rfl:  .  ipratropium-albuterol (DUONEB) 0.5-2.5 (3) MG/3ML SOLN, INHALE 1 VIAL (3MLS) USING NEBULIZER 4 TIMES PER DAY, Disp: 270 mL, Rfl: 11 .  losartan (COZAAR) 50 MG tablet, TAKE 1 TABLET BY MOUTH DAILY, Disp:  90 tablet, Rfl: 2 .  Magnesium 400 MG CAPS, Take 400 mg by mouth daily., Disp: 30 capsule, Rfl: 1 .  metoprolol tartrate (LOPRESSOR) 25 MG tablet, Take 0.5 tablets (12.5 mg total) by mouth 2 (two) times daily. (Patient taking differently: Take 12.5 mg by mouth daily.), Disp: 30 tablet, Rfl: 6 .  mometasone-formoterol (DULERA) 200-5 MCG/ACT AERO, Inhale 2 puffs into the lungs 2 (two) times daily., Disp: 1 each, Rfl: 3 .  nitroGLYCERIN (NITROSTAT) 0.4 MG SL tablet, Place 1 tablet (0.4 mg total) under the tongue every 5 (five) minutes as needed for chest pain., Disp: 25 tablet, Rfl: 4 .  pantoprazole (PROTONIX) 40 MG tablet, Take 1 tablet (40 mg total) by mouth daily. (Patient taking differently: Take 40 mg by mouth 2 (two) times daily. Patient says he is taking twice a day), Disp: 90 tablet, Rfl: 3 .  pregabalin (LYRICA) 50 MG capsule, TAKE 1 CAPSULE BY MOUTH TWICE DAILY, Disp: 60 capsule, Rfl: 3 .  Respiratory Therapy Supplies (FLUTTER) DEVI, Use as directed, Disp: 1 each, Rfl: 0 .  tamsulosin (FLOMAX) 0.4 MG CAPS capsule, Take 0.4 mg by mouth daily., Disp: , Rfl:  .  montelukast (SINGULAIR) 10 MG tablet, TAKE 1 TABLET BY MOUTH EVERY DAY IN THE EVENING (Patient not taking: Reported on 04/22/2020), Disp: 30 tablet, Rfl: 3 .  nicotine (NICODERM CQ - DOSED IN MG/24 HR) 7 mg/24hr patch, Place 1  patch (7 mg total) onto the skin at bedtime. (Patient not taking: Reported on 04/22/2020), Disp: 28 patch, Rfl: 0  Past Medical History: Past Medical History:  Diagnosis Date  . Arthritis   . Bullous emphysema (HCC)    Severe Bilateral  . Cocaine abuse ([place])   . Colitis    with bleeding  . COPD (chronic obstructive pulmonary disease) ([place])   . Coronary artery disease   . Dyspnea   . GERD (gastroesophageal reflux disease)   . Headache    sinusitis  . Hypertension   . LBP (low back pain)    now chronic  . Lumbago   . Lung mass    Right Upper Lobe pleural based mass, negative  on PET Scan  . Neck pain     nerve pain   . Pneumonia    hx  . S/P angioplasty with stent 01/13/20 emergent DES to mLAD  01/15/2020  . Shortness of breath 03/03/2020  . Spine pain    nerve  . Therapeutic drug monitoring   . Tobacco abuse     Tobacco Use: Social History   Tobacco Use  Smoking Status Current Every Day Smoker  . Packs/day: 0.50  . Years: 45.00  . Pack years: 22.50  . Types: Cigarettes  Smokeless Tobacco Never Used  Tobacco Comment   04/22/20 Patient given 1-800-quit-now  and the link to [facility] virtual smoking cessation link    Labs: Recent Review Flowsheet Data    Labs for ITP Cardiac and Pulmonary Rehab Latest Ref Rng & Units 07/18/2018 03/24/2019 09/23/2019 12/02/2019 01/14/2020   Cholestrol 0 - 200 mg/dL - 216(H) 212(H) 227(H) 164   LDLCALC 0 - 99 mg/dL - 144(H) 145(H) 153(H) 91   HDL >40 mg/dL - 54 52 53 61   Trlycerides <150 mg/dL - 99 83 118 62   Hemoglobin A1c 4.8 - 5.6 % 6.4(H) 6.2(H) - - -   TCO2 0 - 100 mmol/L - - - - -      Capillary Blood Glucose: Lab Results  Component Value Date   GLUCAP 121 (H) 01/13/2020   GLUCAP 115 (H) 06/14/2010   GLUCAP 125 (H) 05/20/2010   GLUCAP 96 05/20/2010   GLUCAP 141 (H) 05/19/2010     Exercise Target Goals: Exercise Program Goal: Individual exercise prescription set using results from initial 6 min walk test and THRR while considering  patient's activity barriers and safety.   Exercise Prescription Goal: Starting with aerobic activity 30 plus minutes a day, 3 days per week for initial exercise prescription. Provide home exercise prescription and guidelines that participant acknowledges understanding prior to discharge.  Activity Barriers & Risk Stratification:  Activity Barriers & Cardiac Risk Stratification - 04/22/20 1211      Activity Barriers & Cardiac Risk Stratification   Activity Barriers Back Problems;Other (comment)    Comments Right foot nerve pain and numbness. Bilateral hand and elbow pain (carpel tunnel)     Cardiac Risk Stratification High   3.93 MET's          6 Minute Walk:  6 Minute Walk    Row Name 04/22/20 1208         6 Minute Walk   Phase Initial     Distance 1215 feet     Walk Time 6 minutes     # of Rest Breaks 0     MPH 2.3     METS 2.93     RPE 11     Perceived Dyspnea  1     VO2 Peak 10.26     Symptoms No     Resting HR 68 bpm     Resting BP 104/68     Resting Oxygen Saturation  97 %     Exercise Oxygen Saturation  during 6 min walk 99 %     Max Ex. HR 99 bpm     Max Ex. BP 112/60     2 Minute Post BP 102/62            Oxygen Initial Assessment:   Oxygen Re-Evaluation:   Oxygen Discharge (Final Oxygen Re-Evaluation):   Initial Exercise Prescription:  Initial Exercise Prescription - 04/22/20 1200      Date of Initial Exercise RX and Referring Provider   Date 04/22/20    Referring Provider Skeet Latch MD    Expected Discharge Date 06/18/20      NuStep   Level 3    SPM 80    Minutes 15    METs 1.8      Arm Ergometer   Level 2    Watts 30    RPM 60    Minutes 15    METs 2.5      Prescription Details   Frequency (times per week) 3    Duration Progress to 30 minutes of continuous aerobic without signs/symptoms of physical distress      Intensity   THRR 40-80% of Max Heartrate 61-122    Ratings of Perceived Exertion 11-13      Progression   Progression Continue progressive overload as per policy without signs/symptoms or physical distress.      Resistance Training   Training Prescription Yes    Weight 5    Reps 10-15           Perform Capillary Blood Glucose checks as needed.  Exercise Prescription Changes:   Exercise Comments:   Exercise Goals and Review:  Exercise Goals    Row Name 04/22/20 1252             Exercise Goals   Increase Physical Activity Yes       Intervention Provide advice, education, support and counseling about physical activity/exercise needs.;Develop an individualized exercise  prescription for aerobic and resistive training based on initial evaluation findings, risk stratification, comorbidities and participant's personal goals.       Expected Outcomes Short Term: Attend rehab on a regular basis to increase amount of physical activity.;Long Term: Add in home exercise to make exercise part of routine and to increase amount of physical activity.;Long Term: Exercising regularly at least 3-5 days a week.       Increase Strength and Stamina Yes       Intervention Provide advice, education, support and counseling about physical activity/exercise needs.;Develop an individualized exercise prescription for aerobic and resistive training based on initial evaluation findings, risk stratification, comorbidities and participant's personal goals.       Expected Outcomes Short Term: Increase workloads from initial exercise prescription for resistance, speed, and METs.;Short Term: Perform resistance training exercises routinely during rehab and add in resistance training at home;Long Term: Improve cardiorespiratory fitness, muscular endurance and strength as measured by increased METs and functional capacity (6MWT)       Able to understand and use rate of perceived exertion (RPE) scale Yes       Intervention Provide education and explanation on how to use RPE scale       Expected Outcomes Short Term: Able to use RPE daily in  rehab to express subjective intensity level;Long Term:  Able to use RPE to guide intensity level when exercising independently       Knowledge and understanding of Target Heart Rate Range (THRR) Yes       Intervention Provide education and explanation of THRR including how the numbers were predicted and where they are located for reference       Expected Outcomes Short Term: Able to state/look up THRR;Short Term: Able to use daily as guideline for intensity in rehab;Long Term: Able to use THRR to govern intensity when exercising independently       Understanding of  Exercise Prescription Yes       Intervention Provide education, explanation, and written materials on patient's individual exercise prescription       Expected Outcomes Short Term: Able to explain program exercise prescription;Long Term: Able to explain home exercise prescription to exercise independently              Exercise Goals Re-Evaluation :    Discharge Exercise Prescription (Final Exercise Prescription Changes):   Nutrition:  Target Goals: Understanding of nutrition guidelines, daily intake of sodium <1541m, cholesterol <2070m calories 30% from fat and 7% or less from saturated fats, daily to have 5 or more servings of fruits and vegetables.  Biometrics:  Pre Biometrics - 04/22/20 1259      Pre Biometrics   Height '5\' 11"'  (1.803 m)    Weight 84.5 kg    Waist Circumference 40.5 inches    Hip Circumference 42 inches    Waist to Hip Ratio 0.96 %    BMI (Calculated) 25.99    Triceps Skinfold 20 mm    % Body Fat 28.1 %    Grip Strength 57 kg    Flexibility 13.5 in    Single Leg Stand 15 seconds            Nutrition Therapy Plan and Nutrition Goals:   Nutrition Assessments:  MEDIFICTS Score Key:  ?70 Need to make dietary changes   40-70 Heart Healthy Diet  ? 40 Therapeutic Level Cholesterol Diet   Picture Your Plate Scores:  <4<88nhealthy dietary pattern with much room for improvement.  41-50 Dietary pattern unlikely to meet recommendations for good health and room for improvement.  51-60 More healthful dietary pattern, with some room for improvement.   >60 Healthy dietary pattern, although there may be some specific behaviors that could be improved.    Nutrition Goals Re-Evaluation:   Nutrition Goals Discharge (Final Nutrition Goals Re-Evaluation):   Psychosocial: Target Goals: Acknowledge presence or absence of significant depression and/or stress, maximize coping skills, provide positive support system. Participant is able to verbalize  types and ability to use techniques and skills needed for reducing stress and depression.  Initial Review & Psychosocial Screening:  Initial Psych Review & Screening - 04/22/20 1511      Initial Review   Current issues with Current Stress Concerns    Source of Stress Concerns Family    Comments MiRollenas a sister who has stage four terminal caHomedaleYes   MiKennonas his wife, children and siblings for support     Barriers   Psychosocial barriers to participate in program The patient should benefit from training in stress management and relaxation.      Screening Interventions   Interventions Encouraged to exercise    Expected Outcomes Short Term goal: Utilizing psychosocial counselor, staff and physician  to assist with identification of specific Stressors or current issues interfering with healing process. Setting desired goal for each stressor or current issue identified.;Long Term Goal: Stressors or current issues are controlled or eliminated.           Quality of Life Scores:  Quality of Life - 04/22/20 1310      Quality of Life   Select Quality of Life      Quality of Life Scores   Health/Function Pre 27.5 %    Socioeconomic Pre 25.56 %    Psych/Spiritual Pre 28.29 %    Family Pre 30 %    GLOBAL Pre 27.57 %          Scores of 19 and below usually indicate a poorer quality of life in these areas.  A difference of  2-3 points is a clinically meaningful difference.  A difference of 2-3 points in the total score of the Quality of Life Index has been associated with significant improvement in overall quality of life, self-image, physical symptoms, and general health in studies assessing change in quality of life.  PHQ-9: Recent Review Flowsheet Data    Depression screen [facility] 2/9 04/22/2020 09/23/2019 10/29/2018 06/05/2018 04/19/2018   Decreased Interest 0 2 0 0 0   Down, Depressed, Hopeless 0 2 0 0 0   PHQ - 2 Score 0 4 0 0 0    Altered sleeping - 2 - - -   Tired, decreased energy - 2 - - -   Change in appetite - 1 - - -   Feeling bad or failure about yourself  - 0 - - -   Trouble concentrating - 0 - - -   Moving slowly or fidgety/restless - 0 - - -   Suicidal thoughts - 0 - - -   PHQ-9 Score - 9 - - -   Difficult doing work/chores - Somewhat difficult - - -     Interpretation of Total Score  Total Score Depression Severity:  1-4 = Minimal depression, 5-9 = Mild depression, 10-14 = Moderate depression, 15-19 = Moderately severe depression, 20-27 = Severe depression   Psychosocial Evaluation and Intervention:   Psychosocial Re-Evaluation:   Psychosocial Discharge (Final Psychosocial Re-Evaluation):   Vocational Rehabilitation: Provide vocational rehab assistance to qualifying candidates.   Vocational Rehab Evaluation & Intervention:  Vocational Rehab - 04/22/20 1513      Initial Vocational Rehab Evaluation & Intervention   Assessment shows need for Vocational Rehabilitation No      Vocational Rehab Re-Evaulation   Comments Silvester is retired and does not need vocational rehab at this time           Education: Education Goals: Education classes will be provided on a weekly basis, covering required topics. Participant will state understanding/return demonstration of topics presented.  Learning Barriers/Preferences:  Learning Barriers/Preferences - 04/22/20 1311      Learning Barriers/Preferences   Learning Barriers Sight    Learning Preferences Written Material;Pictoral;Skilled Demonstration           Education Topics: Hypertension, Hypertension Reduction -Define heart disease and high blood pressure. Discus how high blood pressure affects the body and ways to reduce high blood pressure.   Exercise and Your Heart -Discuss why it is important to exercise, the FITT principles of exercise, normal and abnormal responses to exercise, and how to exercise safely.   Angina -Discuss  definition of angina, causes of angina, treatment of angina, and how to decrease risk of having angina.  Cardiac Medications -Review what the following cardiac medications are used for, how they affect the body, and side effects that may occur when taking the medications.  Medications include Aspirin, Beta blockers, calcium channel blockers, ACE Inhibitors, angiotensin receptor blockers, diuretics, digoxin, and antihyperlipidemics.   Congestive Heart Failure -Discuss the definition of CHF, how to live with CHF, the signs and symptoms of CHF, and how keep track of weight and sodium intake.   Heart Disease and Intimacy -Discus the effect sexual activity has on the heart, how changes occur during intimacy as we age, and safety during sexual activity.   Smoking Cessation / COPD -Discuss different methods to quit smoking, the health benefits of quitting smoking, and the definition of COPD.   Nutrition I: Fats -Discuss the types of cholesterol, what cholesterol does to the heart, and how cholesterol levels can be controlled.   Nutrition II: Labels -Discuss the different components of food labels and how to read food label   Heart Parts/Heart Disease and PAD -Discuss the anatomy of the heart, the pathway of blood circulation through the heart, and these are affected by heart disease.   Stress I: Signs and Symptoms -Discuss the causes of stress, how stress may lead to anxiety and depression, and ways to limit stress.   Stress II: Relaxation -Discuss different types of relaxation techniques to limit stress.   Warning Signs of Stroke / TIA -Discuss definition of a stroke, what the signs and symptoms are of a stroke, and how to identify when someone is having stroke.   Knowledge Questionnaire Score:  Knowledge Questionnaire Score - 04/22/20 1311      Knowledge Questionnaire Score   Pre Score 25/28           Core Components/Risk Factors/Patient Goals at Admission:  Personal  Goals and Risk Factors at Admission - 04/22/20 1314      Core Components/Risk Factors/Patient Goals on Admission    Weight Management Weight Maintenance;Yes    Expected Outcomes Weight Maintenance: Understanding of the daily nutrition guidelines, which includes 25-35% calories from fat, 7% or less cal from saturated fats, less than 260m cholesterol, less than 1.5gm of sodium, & 5 or more servings of fruits and vegetables daily    Tobacco Cessation Yes    Number of packs per day 0.25 (about 5 Cigs a day)    Intervention Assist the participant in steps to quit. Provide individualized education and counseling about committing to Tobacco Cessation, relapse prevention, and pharmacological support that can be provided by physician.;OAdvice worker assist with locating and accessing local/national Quit Smoking programs, and support quit date choice.    Expected Outcomes Short Term: Will demonstrate readiness to quit, by selecting a quit date.;Short Term: Will quit all tobacco product use, adhering to prevention of relapse plan.;Long Term: Complete abstinence from all tobacco products for at least 12 months from quit date.    Hypertension Yes    Intervention Provide education on lifestyle modifcations including regular physical activity/exercise, weight management, moderate sodium restriction and increased consumption of fresh fruit, vegetables, and low fat dairy, alcohol moderation, and smoking cessation.    Expected Outcomes Short Term: Continued assessment and intervention until BP is < 140/961mHG in hypertensive participants. < 130/8021mG in hypertensive participants with diabetes, heart failure or chronic kidney disease.;Long Term: Maintenance of blood pressure at goal levels.    Lipids Yes    Intervention Provide education and support for participant on nutrition & aerobic/resistive exercise along with prescribed medications to achieve  LDL <18m, HDL >458m    Expected Outcomes Short  Term: Participant states understanding of desired cholesterol values and is compliant with medications prescribed. Participant is following exercise prescription and nutrition guidelines.;Long Term: Cholesterol controlled with medications as prescribed, with individualized exercise RX and with personalized nutrition plan. Value goals: LDL < 7019mHDL > 40 mg.    Stress Yes    Intervention Offer individual and/or small group education and counseling on adjustment to heart disease, stress management and health-related lifestyle change. Teach and support self-help strategies.;Refer participants experiencing significant psychosocial distress to appropriate mental health specialists for further evaluation and treatment. When possible, include family members and significant others in education/counseling sessions.    Expected Outcomes Short Term: Participant demonstrates changes in health-related behavior, relaxation and other stress management skills, ability to obtain effective social support, and compliance with psychotropic medications if prescribed.;Long Term: Emotional wellbeing is indicated by absence of clinically significant psychosocial distress or social isolation.           Core Components/Risk Factors/Patient Goals Review:    Core Components/Risk Factors/Patient Goals at Discharge (Final Review):    ITP Comments:  ITP Comments    Row Name 04/22/20 1057           ITP Comments Dr TurRadford Pax, Medical Director              Comments: MicLegrand Comotended orientation on 04/22/2020 to review rules and guidelines for program.  Completed 6 minute walk test, Intitial ITP, and exercise prescription.  VSS. Telemetry-Sinus Rhythm with arrythmia.  Asymptomatic. Safety measures and social distancing in place per CDC guidelines.Please see previous documentation. MicKaielnies complaints or concerns upon exit from phase 2 cardiac rehab.MarBarnet PallN,BSN 04/22/2020 3:21 PM

## 2020-04-22 NOTE — Progress Notes (Addendum)
Peter Sims [facility] reviewed the 12 lead ECG and said that [name] may proceed with his walk test and orientation. Will cancel [name]'s's appointment for Monday as [name] has a virtual appointment with Dr Oval Linsey. [name] will start exercise at cardiac rehab on Wednesday. Barnet Pall, RN,BSN 04/22/2020 10:44 AM

## 2020-04-22 NOTE — Progress Notes (Signed)
Patient here for cardiac rehab orientation. Upon completing health history. Peter Sims reports that he has been having mild chest discomfort non radiating in his left anterior chest area. Peter Sims says that he has felt discomfort in his chest area since his stent placement on and off. Peter Sims say that the chest discomfort does not feel the same as when he had his MI/stent and feels that it may be musculoskeletal in nature. Peter Sims rates the discomfort a 1/10. Blood pressure 104/68. Oxygen saturation 97% on room air. Patient telemetry rhythm Sinus with arrhythmia rate 67. Spoke with Peter Sims [facility] will obtain 12 lead ECG.Barnet Pall, RN,BSN 04/22/2020 9:13 AM

## 2020-04-23 ENCOUNTER — Ambulatory Visit (HOSPITAL_COMMUNITY): Payer: 59

## 2020-04-26 ENCOUNTER — Telehealth (INDEPENDENT_AMBULATORY_CARE_PROVIDER_SITE_OTHER): Payer: 59 | Admitting: Cardiovascular Disease

## 2020-04-26 ENCOUNTER — Encounter: Payer: Self-pay | Admitting: Cardiovascular Disease

## 2020-04-26 ENCOUNTER — Encounter (HOSPITAL_COMMUNITY): Payer: 59

## 2020-04-26 ENCOUNTER — Ambulatory Visit (HOSPITAL_COMMUNITY): Payer: 59

## 2020-04-26 VITALS — BP 121/87 | HR 91 | Ht 72.0 in | Wt 172.0 lb

## 2020-04-26 DIAGNOSIS — Z79899 Other long term (current) drug therapy: Secondary | ICD-10-CM

## 2020-04-26 DIAGNOSIS — R072 Precordial pain: Secondary | ICD-10-CM | POA: Diagnosis not present

## 2020-04-26 DIAGNOSIS — I2583 Coronary atherosclerosis due to lipid rich plaque: Secondary | ICD-10-CM | POA: Diagnosis not present

## 2020-04-26 DIAGNOSIS — F17210 Nicotine dependence, cigarettes, uncomplicated: Secondary | ICD-10-CM

## 2020-04-26 DIAGNOSIS — I2510 Atherosclerotic heart disease of native coronary artery without angina pectoris: Secondary | ICD-10-CM

## 2020-04-26 DIAGNOSIS — E785 Hyperlipidemia, unspecified: Secondary | ICD-10-CM

## 2020-04-26 DIAGNOSIS — I10 Essential (primary) hypertension: Secondary | ICD-10-CM | POA: Diagnosis not present

## 2020-04-26 DIAGNOSIS — I700 Atherosclerosis of aorta: Secondary | ICD-10-CM | POA: Diagnosis not present

## 2020-04-26 NOTE — Progress Notes (Signed)
Virtual Visit via Video Note   This visit type was conducted due to national recommendations for restrictions regarding the COVID-19 Pandemic (e.g. social distancing) in an effort to limit this patient's exposure and mitigate transmission in our community.  Due to his co-morbid illnesses, this patient is at least at moderate risk for complications without adequate follow up.  This format is felt to be most appropriate for this patient at this time.  All issues noted in this document were discussed and addressed.  A limited physical exam was performed with this format.  Please refer to the patient's chart for his consent to telehealth for [facility].      The patient was identified using 2 identifiers.  Date:  04/26/2020   ID:  Peter Sims, DOB June 21, 1950, MRN [id number]  Patient Location: Home Provider Location: Office/Clinic  PCP:  Glendale Chard, MD  Cardiologist:  Skeet Latch, MD  Electrophysiologist:  None   Evaluation Performed:  Follow-Up Visit  Chief Complaint:  Chest pain  History of Present Illness:     The patient does not have symptoms concerning for COVID-19 infection (fever, chills, cough, or new shortness of breath).    Peter Sims is a 70 y.o. male with CAD s/p anterior STEMI, hypertension, hyperlipidemia, tobacco abuse, bullous emphysema, COPD, and GERD here for follow up.  He was initially seen 12/2019 for the evaluation of chest pain at the request.  He was seen in the ED 11/2019 with precordial chest pain.  He also had pain in his back and neck at the time.  He had an episode of getting lightheaded and sweaty after having a bowel movement.  He felt lethargic and sweaty.  He also had slurred speech.  He called EMS and did orthostatic vitals that reportedly showed hypotension.  His blood pressure improved and they decided that he did not need to go to the hospital.  He called his PCP the following day and they recommended that he go to the  emergency department for evaluation.  In the ED, high-sensitivity troponin revealed a pattern of 8-->22-->7.  EKG revealed sinus tachycardia to 112 bpm.  There were no acute ischemic changes.  Chest CT-A revealed pulmonary nodule, foci of atherosclerosis in the coronaries and aorta.  He was felt to be stable to be discharged and followed up with cardiology as an outpatient.  Peter Sims recalls another similar episode that occurred when standing.  He was outside talking with a neighbor.  He then got a sharp pain in his chest.  He started walking into the kitchen and started seating profusely.  He was unable to speak to them.  He knew what he wanted to say but couldn't get the words out.  He nearly passed out.  The pain he describes was debilitating, sharp pain in the shoulder that felt like a spasm.  He has experienced pain like this other times when he turns too quickly.    At his initial appointment he was referred for coronary CT-A.  However before he could have this procedure he presented back to the hospital with chest pain.  Initial EKG was not consistent with STEMI.  High-sensitivity troponin was elevated to the 80s and then increased to 106.  He had an acute episode of chest pain overnight and was taken to the Cath Lab.  He developed an anterior S elevation MI and had PCI of the mid LAD.  He otherwise had mild to moderate lesions that were medically managed.  He was started on metoprolol and ticagrelor.   Peter Sims continues to have pain in his L chest.  It is constant but flares up sporadically.  It feels like nagging pain.  Yesterday it was more intense for an hour so he went to the ED. he went to the ED on 03/03/2020.  In the ED his EKG showed sinus arrhythmia.  Cardiac enzymes were negative.  He has also been feeling short of breath and gasping for air at times.  He has been feeling fatigued with minimal exertion but doesn't have any chest pain. Peter Sims started cardiac rehab last week and  thinks that he will enjoy it.  He reported pain in his L chest that he thinks is musculoskeletal.  It feels different than when he had a heart attack.  It is a dull, nagging pain that can occur at rest or with exertion.  He leans on his L arm and lays on his L side.  His BP has been well-controlled since his cath. He has been struggling with pain from diverticulitis.  He has been losing weight.  He is using nicotine patches and started the gum.  He is down to 5 cigarettes daily.     Past Medical History:  Diagnosis Date   Arthritis    Bullous emphysema (HCC)    Severe Bilateral   Cocaine abuse ([place])    Colitis    with bleeding   COPD (chronic obstructive pulmonary disease) (HCC)    Coronary artery disease    Dyspnea    GERD (gastroesophageal reflux disease)    Headache    sinusitis   Hypertension    LBP (low back pain)    now chronic   Lumbago    Lung mass    Right Upper Lobe pleural based mass, negative  on PET Scan   Neck pain    nerve pain    Pneumonia    hx   S/P angioplasty with stent 01/13/20 emergent DES to mLAD  01/15/2020   Shortness of breath 03/03/2020   Spine pain    nerve   Therapeutic drug monitoring    Tobacco abuse     Past Surgical History:  Procedure Laterality Date   CARDIAC CATHETERIZATION     COLONOSCOPY     CORONARY/GRAFT ACUTE MI REVASCULARIZATION N/A 01/14/2020   Procedure: Coronary/Graft Acute MI Revascularization;  Surgeon: Sherren Mocha, MD;  Location: [facility];  Service: Cardiovascular;  Laterality: N/A;   LEFT HEART CATH AND CORONARY ANGIOGRAPHY N/A 01/14/2020   Procedure: LEFT HEART CATH AND CORONARY ANGIOGRAPHY;  Surgeon: Sherren Mocha, MD;  Location: [facility];  Service: Cardiovascular;  Laterality: N/A;   LUMBAR LAMINECTOMY/DECOMPRESSION MICRODISCECTOMY Right 05/03/2016   Procedure: MICRODISCECTOMY LUMBAR FIVE- SACRAL ONE RIGHT;  Surgeon: Ashok Pall, MD;  Location: [place];  Service: Neurosurgery;   Laterality: Right;   NO PAST SURGERIES       Current Outpatient Medications  Medication Sig Dispense Refill   acetaminophen (TYLENOL) 325 MG tablet Take 2 tablets (650 mg total) by mouth every 4 (four) hours as needed for headache or mild pain.     albuterol (PROVENTIL HFA;VENTOLIN HFA) 108 (90 BASE) MCG/ACT inhaler Inhale 2 puffs into the lungs every 6 (six) hours as needed for wheezing or shortness of breath.      aspirin 81 MG chewable tablet Chew 1 tablet (81 mg total) by mouth daily.     atorvastatin (LIPITOR) 80 MG tablet Take 1 tablet (80 mg total)  by mouth daily. 30 tablet 6   buPROPion (WELLBUTRIN XL) 150 MG 24 hr tablet Take 150 mg by mouth as needed.     Carboxymethylcellul-Glycerin (CLEAR EYES FOR DRY EYES OP) Apply 1 drop to eye daily as needed (for dry eyes).     clopidogrel (PLAVIX) 75 MG tablet TAKE 300 MG TIMES 1 DOSE TOMORROW AND THEN TAKE 1 TABLET DAILY 93 tablet 3   cyclobenzaprine (FLEXERIL) 10 MG tablet TAKE 1 TABLET (10 MG TOTAL) BY MOUTH AS NEEDED FOR MUSCLE SPASMS. 30 tablet 0   diclofenac Sodium (VOLTAREN) 1 % GEL Apply 2 g topically 2 (two) times daily.     ipratropium-albuterol (DUONEB) 0.5-2.5 (3) MG/3ML SOLN INHALE 1 VIAL (3MLS) USING NEBULIZER 4 TIMES PER DAY 270 mL 11   losartan (COZAAR) 50 MG tablet TAKE 1 TABLET BY MOUTH DAILY 90 tablet 2   Magnesium 400 MG CAPS Take 400 mg by mouth daily. 30 capsule 1   metoprolol tartrate (LOPRESSOR) 25 MG tablet Take 0.5 tablets (12.5 mg total) by mouth 2 (two) times daily. (Patient taking differently: Take 12.5 mg by mouth daily.) 30 tablet 6   mometasone-formoterol (DULERA) 200-5 MCG/ACT AERO Inhale 2 puffs into the lungs 2 (two) times daily. 1 each 3   montelukast (SINGULAIR) 10 MG tablet TAKE 1 TABLET BY MOUTH EVERY DAY IN THE EVENING 30 tablet 3   nicotine (NICODERM CQ - DOSED IN MG/24 HR) 7 mg/24hr patch Place 1 patch (7 mg total) onto the skin at bedtime. 28 patch 0   nitroGLYCERIN (NITROSTAT) 0.4  MG SL tablet Place 1 tablet (0.4 mg total) under the tongue every 5 (five) minutes as needed for chest pain. 25 tablet 4   pantoprazole (PROTONIX) 40 MG tablet Take 1 tablet (40 mg total) by mouth daily. (Patient taking differently: Take 40 mg by mouth 2 (two) times daily. Patient says he is taking twice a day) 90 tablet 3   pregabalin (LYRICA) 50 MG capsule TAKE 1 CAPSULE BY MOUTH TWICE DAILY 60 capsule 3   Respiratory Therapy Supplies (FLUTTER) DEVI Use as directed 1 each 0   tamsulosin (FLOMAX) 0.4 MG CAPS capsule Take 0.4 mg by mouth daily.     No current facility-administered medications for this visit.    Allergies:   Penicillins    Social History:  The patient  reports that he has been smoking cigarettes. He has a 22.50 pack-year smoking history. He has never used smokeless tobacco. He reports current alcohol use. He reports current drug use. Drugs: Cocaine and Marijuana.   Family History:  The patient's family history includes Breast cancer in his sister; Cancer in his father; Emphysema in his mother; Heart failure in his sister; Lung cancer in his father. He was adopted.    ROS:  Please see the history of present illness.   Otherwise, review of systems are positive for none.   All other systems are reviewed and negative.    PHYSICAL EXAM: BP 121/87    Pulse 91    Ht 6' (1.829 m)    Wt 172 lb (78 kg)    BMI 23.33 kg/m  GENERAL: Well-appearing.  No acute distress. HEENT: Pupils equal round.  Oral mucosa unremarkable NECK:  No jugular venous distention, no visible thyromegaly EXT:  No edema, no cyanosis no clubbing SKIN:  No rashes no nodules NEURO:  Speech fluent.  Cranial nerves grossly intact.  Moves all 4 extremities freely PSYCH:  Cognitively intact, oriented to person place and time  EKG:  EKG is not ordered today. 01/20/2020: Sinus arrhythmia.  Rate 82 bpm. 03/03/2019: Sinus arrhythmia.  Rate 73 bpm.  Nonspecific ST abnormalities.  LHC 01/14/20:  1.  Severe mid  LAD stenosis (culprit lesion) treated successfully with PCI using a 3.5 x 16 mm Synergy DES 2.  Mild nonobstructive proximal LAD, mid circumflex, OM 2, and proximal RCA stenoses 3.  Normal LV systolic function with normal LVEDP  Recommendations: Aggressive medical therapy for secondary risk reduction.  Tobacco cessation.  The patient is loaded with ticagrelor 180 mg in the Cath Lab.  He is started on a high intensity statin drug and a beta-blocker.  Discussed cardiac catheterization findings with patient and his wife.  If no complications arise, should be eligible for hospital discharge within 24 hours.   Recent Labs: 01/13/2020: ALT 28 03/02/2020: BUN 11; Creatinine, Ser 0.97; Hemoglobin 15.7; Platelets 262; Potassium 3.6; Sodium 138    Lipid Panel    Component Value Date/Time   CHOL 164 01/14/2020 0128   CHOL 227 (H) 12/02/2019 1219   TRIG 62 01/14/2020 0128   HDL 61 01/14/2020 0128   HDL 53 12/02/2019 1219   CHOLHDL 2.7 01/14/2020 0128   VLDL 12 01/14/2020 0128   LDLCALC 91 01/14/2020 0128   LDLCALC 153 (H) 12/02/2019 1219      Wt Readings from Last 3 Encounters:  04/26/20 172 lb (78 kg)  04/22/20 186 lb 4.6 oz (84.5 kg)  03/03/20 186 lb 12.8 oz (84.7 kg)      ASSESSMENT AND PLAN:  # CAD s/p STEMI and LAD PCI: # Hyperlipidemia: Peter Sims continues to have atypical chest pain.  His symptoms do not seem ischemic.  However it is sometimes associated with exertion and seems to get better with nitroglycerin.  Therefore we will get an ETT.  Continue aspirin, atorvastatin, Plavix, and metoprolol.  He will come back for fasting lipids and a CMP.  He will switch from ticagrelor to clopidogrel due to cost.  # Near syncope:  Peter Sims's episode sound like classic vasovagal hypotension.  No recent symptoms.  # Tobacco abuse:  Recommend cessation.  He will continue to cut back.  He is using nicotine patches and gum and cutting back to 5 cigarettes.  # Hypertension:  BP  stable on l metoprolol and losartan.  COVID-19 Education: The signs and symptoms of COVID-19 were discussed with the patient and how to seek care for testing (follow up with PCP or arrange E-visit).  The importance of social distancing was discussed today.  Time:   Today, I have spent 30 minutes with the patient with telehealth technology discussing the above problems  Current medicines are reviewed at length with the patient today.  The patient does not have concerns regarding medicines.  The following changes have been made: None  Labs/ tests ordered today include:   Orders Placed This Encounter  Procedures   Comprehensive metabolic panel   Lipid panel   EXERCISE TOLERANCE TEST (ETT)     Disposition:   FU with Sila Sarsfield C. Oval Linsey, MD, [facility] in 5 months as previously scheduled.    Signed, Bertie Simien C. Oval Linsey, MD, [facility]  04/26/2020 10:36 AM    [facility] the last started

## 2020-04-26 NOTE — Patient Instructions (Signed)
Medication Instructions:  No Changes In Medications at this time.  *If you need a refill on your cardiac medications before your next appointment, please call your pharmacy*  Lab Work: YOU WILL NEED A COVID TEST PRIOR TO STRESS TEST  PLEASE COME TO THE OFFICE FOR FASTING LABS If you have labs (blood work) drawn today and your tests are completely normal, you will receive your results only by: Marland Kitchen MyChart Message (if you have MyChart) OR . A paper copy in the mail If you have any lab test that is abnormal or we need to change your treatment, we will call you to review the results.  Testing/Procedures: Your physician has requested that you have an exercise tolerance test. For further information please visit [email or web address]. Please also follow instruction sheet, as given.   Your physician has requested that you have an exercise tolerance test. For further information please visit [email or web address]. Please also follow instruction sheet, as given.   Do not drink or eat foods with caffeine for 24 hours before the test. (Chocolate, coffee, tea, or energy drinks)  If you use an inhaler, bring it with you to the test.  Do not smoke for 4 hours before the test.  Wear comfortable shoes and clothing.  Follow-Up: At [facility], you and your health needs are our priority.  As part of our continuing mission to provide you with exceptional heart care, we have created designated Provider Care Teams.  These Care Teams include your primary Cardiologist (physician) and Advanced Practice Providers (APPs -  Physician Assistants and Nurse Practitioners) who all work together to provide you with the care you need, when you need it.  Your next appointment:   KEEP FOLLOW UP AS SCHEDULED   The format for your next appointment:   In Person  Provider:   Skeet Latch, MD

## 2020-04-27 ENCOUNTER — Encounter: Payer: Self-pay | Admitting: Internal Medicine

## 2020-04-27 ENCOUNTER — Encounter (HOSPITAL_COMMUNITY): Payer: Self-pay | Admitting: *Deleted

## 2020-04-27 ENCOUNTER — Other Ambulatory Visit: Payer: Self-pay | Admitting: Internal Medicine

## 2020-04-27 ENCOUNTER — Telehealth: Payer: Self-pay

## 2020-04-27 DIAGNOSIS — Z955 Presence of coronary angioplasty implant and graft: Secondary | ICD-10-CM

## 2020-04-27 DIAGNOSIS — I2102 ST elevation (STEMI) myocardial infarction involving left anterior descending coronary artery: Secondary | ICD-10-CM

## 2020-04-27 DIAGNOSIS — Z95820 Peripheral vascular angioplasty status with implants and grafts: Secondary | ICD-10-CM

## 2020-04-27 MED FILL — LOSARTAN POTASSIUM 50 MG TA: 50 | 30 days supply | Qty: 30 | Fill #1

## 2020-04-27 NOTE — Progress Notes (Signed)
Cardiac Individual Treatment Plan  Patient Details  Name: Peter Sims MRN: [id number] Date of Birth: [birth year] Referring Provider:   Flowsheet Row CARDIAC REHAB PHASE II ORIENTATION from 04/22/2020 in [facility] [place]  Referring Provider Skeet Latch MD      Initial Encounter Date:  [place] from 04/22/2020 in [facility]  Date 04/22/20      Visit Diagnosis: STEMI 01/14/20   S/P DES LAD 01/14/20  S/P angioplasty with stent 01/13/20 emergent DES to mLAD   Patient's Home Medications on Admission:  Current Outpatient Medications:  .  acetaminophen (TYLENOL) 325 MG tablet, Take 2 tablets (650 mg total) by mouth every 4 (four) hours as needed for headache or mild pain., Disp: , Rfl:  .  albuterol (PROVENTIL HFA;VENTOLIN HFA) 108 (90 BASE) MCG/ACT inhaler, Inhale 2 puffs into the lungs every 6 (six) hours as needed for wheezing or shortness of breath. , Disp: , Rfl:  .  aspirin 81 MG chewable tablet, Chew 1 tablet (81 mg total) by mouth daily., Disp: , Rfl:  .  atorvastatin (LIPITOR) 80 MG tablet, Take 1 tablet (80 mg total) by mouth daily., Disp: 30 tablet, Rfl: 6 .  buPROPion (WELLBUTRIN XL) 150 MG 24 hr tablet, Take 150 mg by mouth as needed., Disp: , Rfl:  .  Carboxymethylcellul-Glycerin (CLEAR EYES FOR DRY EYES OP), Apply 1 drop to eye daily as needed (for dry eyes)., Disp: , Rfl:  .  clopidogrel (PLAVIX) 75 MG tablet, TAKE 300 MG TIMES 1 DOSE TOMORROW AND THEN TAKE 1 TABLET DAILY, Disp: 93 tablet, Rfl: 3 .  cyclobenzaprine (FLEXERIL) 10 MG tablet, TAKE 1 TABLET (10 MG TOTAL) BY MOUTH AS NEEDED FOR MUSCLE SPASMS., Disp: 30 tablet, Rfl: 0 .  diclofenac Sodium (VOLTAREN) 1 % GEL, Apply 2 g topically 2 (two) times daily., Disp: , Rfl:  .  ipratropium-albuterol (DUONEB) 0.5-2.5 (3) MG/3ML SOLN, INHALE 1 VIAL (3MLS) USING NEBULIZER 4 TIMES PER DAY, Disp: 270 mL, Rfl: 11 .   losartan (COZAAR) 50 MG tablet, TAKE 1 TABLET BY MOUTH DAILY, Disp: 90 tablet, Rfl: 2 .  Magnesium 400 MG CAPS, Take 400 mg by mouth daily., Disp: 30 capsule, Rfl: 1 .  metoprolol tartrate (LOPRESSOR) 25 MG tablet, Take 0.5 tablets (12.5 mg total) by mouth 2 (two) times daily. (Patient taking differently: Take 12.5 mg by mouth daily.), Disp: 30 tablet, Rfl: 6 .  mometasone-formoterol (DULERA) 200-5 MCG/ACT AERO, Inhale 2 puffs into the lungs 2 (two) times daily., Disp: 1 each, Rfl: 3 .  montelukast (SINGULAIR) 10 MG tablet, TAKE 1 TABLET BY MOUTH EVERY DAY IN THE EVENING, Disp: 30 tablet, Rfl: 3 .  nicotine (NICODERM CQ - DOSED IN MG/24 HR) 7 mg/24hr patch, Place 1 patch (7 mg total) onto the skin at bedtime., Disp: 28 patch, Rfl: 0 .  nitroGLYCERIN (NITROSTAT) 0.4 MG SL tablet, Place 1 tablet (0.4 mg total) under the tongue every 5 (five) minutes as needed for chest pain., Disp: 25 tablet, Rfl: 4 .  pantoprazole (PROTONIX) 40 MG tablet, Take 1 tablet (40 mg total) by mouth daily. (Patient taking differently: Take 40 mg by mouth 2 (two) times daily. Patient says he is taking twice a day), Disp: 90 tablet, Rfl: 3 .  pregabalin (LYRICA) 50 MG capsule, TAKE 1 CAPSULE BY MOUTH TWO TIMES DAILY, Disp: 60 capsule, Rfl: 2 .  Respiratory Therapy Supplies (FLUTTER) DEVI, Use as directed, Disp: 1 each,  Rfl: 0 .  tamsulosin (FLOMAX) 0.4 MG CAPS capsule, Take 0.4 mg by mouth daily., Disp: , Rfl:   Past Medical History: Past Medical History:  Diagnosis Date  . Arthritis   . Bullous emphysema (HCC)    Severe Bilateral  . Cocaine abuse ([place])   . Colitis    with bleeding  . COPD (chronic obstructive pulmonary disease) ([place])   . Coronary artery disease   . Dyspnea   . GERD (gastroesophageal reflux disease)   . Headache    sinusitis  . Hypertension   . LBP (low back pain)    now chronic  . Lumbago   . Lung mass    Right Upper Lobe pleural based mass, negative  on PET Scan  . Neck pain    nerve pain    . Pneumonia    hx  . S/P angioplasty with stent 01/13/20 emergent DES to mLAD  01/15/2020  . Shortness of breath 03/03/2020  . Spine pain    nerve  . Therapeutic drug monitoring   . Tobacco abuse     Tobacco Use: Social History   Tobacco Use  Smoking Status Current Every Day Smoker  . Packs/day: 0.50  . Years: 45.00  . Pack years: 22.50  . Types: Cigarettes  Smokeless Tobacco Never Used  Tobacco Comment   04/22/20 Patient given 1-800-quit-now  and the link to [place] virtual smoking cessation link    Labs: Recent Review Flowsheet Data    Labs for ITP Cardiac and Pulmonary Rehab Latest Ref Rng & Units 07/18/2018 03/24/2019 09/23/2019 12/02/2019 01/14/2020   Cholestrol 0 - 200 mg/dL - 216(H) 212(H) 227(H) 164   LDLCALC 0 - 99 mg/dL - 144(H) 145(H) 153(H) 91   HDL >40 mg/dL - 54 52 53 61   Trlycerides <150 mg/dL - 99 83 118 62   Hemoglobin A1c 4.8 - 5.6 % 6.4(H) 6.2(H) - - -   TCO2 0 - 100 mmol/L - - - - -      Capillary Blood Glucose: Lab Results  Component Value Date   GLUCAP 121 (H) 01/13/2020   GLUCAP 115 (H) 06/14/2010   GLUCAP 125 (H) 05/20/2010   GLUCAP 96 05/20/2010   GLUCAP 141 (H) 05/19/2010     Exercise Target Goals: Exercise Program Goal: Individual exercise prescription set using results from initial 6 min walk test and THRR while considering  patient's activity barriers and safety.   Exercise Prescription Goal: Starting with aerobic activity 30 plus minutes a day, 3 days per week for initial exercise prescription. Provide home exercise prescription and guidelines that participant acknowledges understanding prior to discharge.  Activity Barriers & Risk Stratification:  Activity Barriers & Cardiac Risk Stratification - 04/22/20 1211      Activity Barriers & Cardiac Risk Stratification   Activity Barriers Back Problems;Other (comment)    Comments Right foot nerve pain and numbness. Bilateral hand and elbow pain (carpel tunnel)    Cardiac Risk  Stratification High   3.93 MET's          6 Minute Walk:  6 Minute Walk    Row Name 04/22/20 1208         6 Minute Walk   Phase Initial     Distance 1215 feet     Walk Time 6 minutes     # of Rest Breaks 0     MPH 2.3     METS 2.93     RPE 11     Perceived Dyspnea  1     VO2 Peak 10.26     Symptoms No     Resting HR 68 bpm     Resting BP 104/68     Resting Oxygen Saturation  97 %     Exercise Oxygen Saturation  during 6 min walk 99 %     Max Ex. HR 99 bpm     Max Ex. BP 112/60     2 Minute Post BP 102/62            Oxygen Initial Assessment:   Oxygen Re-Evaluation:   Oxygen Discharge (Final Oxygen Re-Evaluation):   Initial Exercise Prescription:  Initial Exercise Prescription - 04/22/20 1200      Date of Initial Exercise RX and Referring Provider   Date 04/22/20    Referring Provider Skeet Latch MD    Expected Discharge Date 06/18/20      NuStep   Level 3    SPM 80    Minutes 15    METs 1.8      Arm Ergometer   Level 2    Watts 30    RPM 60    Minutes 15    METs 2.5      Prescription Details   Frequency (times per week) 3    Duration Progress to 30 minutes of continuous aerobic without signs/symptoms of physical distress      Intensity   THRR 40-80% of Max Heartrate 61-122    Ratings of Perceived Exertion 11-13      Progression   Progression Continue progressive overload as per policy without signs/symptoms or physical distress.      Resistance Training   Training Prescription Yes    Weight 5    Reps 10-15           Perform Capillary Blood Glucose checks as needed.  Exercise Prescription Changes:   Exercise Comments:   Exercise Goals and Review:  Exercise Goals    Row Name 04/22/20 1252             Exercise Goals   Increase Physical Activity Yes       Intervention Provide advice, education, support and counseling about physical activity/exercise needs.;Develop an individualized exercise prescription for  aerobic and resistive training based on initial evaluation findings, risk stratification, comorbidities and participant's personal goals.       Expected Outcomes Short Term: Attend rehab on a regular basis to increase amount of physical activity.;Long Term: Add in home exercise to make exercise part of routine and to increase amount of physical activity.;Long Term: Exercising regularly at least 3-5 days a week.       Increase Strength and Stamina Yes       Intervention Provide advice, education, support and counseling about physical activity/exercise needs.;Develop an individualized exercise prescription for aerobic and resistive training based on initial evaluation findings, risk stratification, comorbidities and participant's personal goals.       Expected Outcomes Short Term: Increase workloads from initial exercise prescription for resistance, speed, and METs.;Short Term: Perform resistance training exercises routinely during rehab and add in resistance training at home;Long Term: Improve cardiorespiratory fitness, muscular endurance and strength as measured by increased METs and functional capacity (6MWT)       Able to understand and use rate of perceived exertion (RPE) scale Yes       Intervention Provide education and explanation on how to use RPE scale       Expected Outcomes Short Term: Able to use RPE daily in  rehab to express subjective intensity level;Long Term:  Able to use RPE to guide intensity level when exercising independently       Knowledge and understanding of Target Heart Rate Range (THRR) Yes       Intervention Provide education and explanation of THRR including how the numbers were predicted and where they are located for reference       Expected Outcomes Short Term: Able to state/look up THRR;Short Term: Able to use daily as guideline for intensity in rehab;Long Term: Able to use THRR to govern intensity when exercising independently       Understanding of Exercise Prescription  Yes       Intervention Provide education, explanation, and written materials on patient's individual exercise prescription       Expected Outcomes Short Term: Able to explain program exercise prescription;Long Term: Able to explain home exercise prescription to exercise independently              Exercise Goals Re-Evaluation :    Discharge Exercise Prescription (Final Exercise Prescription Changes):   Nutrition:  Target Goals: Understanding of nutrition guidelines, daily intake of sodium <1571m, cholesterol <2054m calories 30% from fat and 7% or less from saturated fats, daily to have 5 or more servings of fruits and vegetables.  Biometrics:  Pre Biometrics - 04/22/20 1259      Pre Biometrics   Height '5\' 11"'  (1.803 m)    Weight 186 lb 4.6 oz (84.5 kg)    Waist Circumference 40.5 inches    Hip Circumference 42 inches    Waist to Hip Ratio 0.96 %    BMI (Calculated) 25.99    Triceps Skinfold 20 mm    % Body Fat 28.1 %    Grip Strength 57 kg    Flexibility 13.5 in    Single Leg Stand 15 seconds            Nutrition Therapy Plan and Nutrition Goals:   Nutrition Assessments:  MEDIFICTS Score Key:  ?70 Need to make dietary changes   40-70 Heart Healthy Diet  ? 40 Therapeutic Level Cholesterol Diet   Picture Your Plate Scores:  <4<70nhealthy dietary pattern with much room for improvement.  41-50 Dietary pattern unlikely to meet recommendations for good health and room for improvement.  51-60 More healthful dietary pattern, with some room for improvement.   >60 Healthy dietary pattern, although there may be some specific behaviors that could be improved.    Nutrition Goals Re-Evaluation:   Nutrition Goals Discharge (Final Nutrition Goals Re-Evaluation):   Psychosocial: Target Goals: Acknowledge presence or absence of significant depression and/or stress, maximize coping skills, provide positive support system. Participant is able to verbalize types and  ability to use techniques and skills needed for reducing stress and depression.  Initial Review & Psychosocial Screening:  Initial Psych Review & Screening - 04/22/20 1511      Initial Review   Current issues with Current Stress Concerns    Source of Stress Concerns Family    Comments MiCandyas a sister who has stage four terminal caNeedhamYes   MiLeelanas his wife, children and siblings for support     Barriers   Psychosocial barriers to participate in program The patient should benefit from training in stress management and relaxation.      Screening Interventions   Interventions Encouraged to exercise    Expected Outcomes Short Term goal: Utilizing psychosocial  counselor, staff and physician to assist with identification of specific Stressors or current issues interfering with healing process. Setting desired goal for each stressor or current issue identified.;Long Term Goal: Stressors or current issues are controlled or eliminated.           Quality of Life Scores:  Quality of Life - 04/22/20 1310      Quality of Life   Select Quality of Life      Quality of Life Scores   Health/Function Pre 27.5 %    Socioeconomic Pre 25.56 %    Psych/Spiritual Pre 28.29 %    Family Pre 30 %    GLOBAL Pre 27.57 %          Scores of 19 and below usually indicate a poorer quality of life in these areas.  A difference of  2-3 points is a clinically meaningful difference.  A difference of 2-3 points in the total score of the Quality of Life Index has been associated with significant improvement in overall quality of life, self-image, physical symptoms, and general health in studies assessing change in quality of life.  PHQ-9: Recent Review Flowsheet Data    Depression screen [facility] 2/9 04/22/2020 09/23/2019 10/29/2018 06/05/2018 04/19/2018   Decreased Interest 0 2 0 0 0   Down, Depressed, Hopeless 0 2 0 0 0   PHQ - 2 Score 0 4 0 0 0   Altered  sleeping - 2 - - -   Tired, decreased energy - 2 - - -   Change in appetite - 1 - - -   Feeling bad or failure about yourself  - 0 - - -   Trouble concentrating - 0 - - -   Moving slowly or fidgety/restless - 0 - - -   Suicidal thoughts - 0 - - -   PHQ-9 Score - 9 - - -   Difficult doing work/chores - Somewhat difficult - - -     Interpretation of Total Score  Total Score Depression Severity:  1-4 = Minimal depression, 5-9 = Mild depression, 10-14 = Moderate depression, 15-19 = Moderately severe depression, 20-27 = Severe depression   Psychosocial Evaluation and Intervention:   Psychosocial Re-Evaluation:   Psychosocial Discharge (Final Psychosocial Re-Evaluation):   Vocational Rehabilitation: Provide vocational rehab assistance to qualifying candidates.   Vocational Rehab Evaluation & Intervention:  Vocational Rehab - 04/22/20 1513      Initial Vocational Rehab Evaluation & Intervention   Assessment shows need for Vocational Rehabilitation No      Vocational Rehab Re-Evaulation   Comments Izea is retired and does not need vocational rehab at this time           Education: Education Goals: Education classes will be provided on a weekly basis, covering required topics. Participant will state understanding/return demonstration of topics presented.  Learning Barriers/Preferences:  Learning Barriers/Preferences - 04/22/20 1311      Learning Barriers/Preferences   Learning Barriers Sight    Learning Preferences Written Material;Pictoral;Skilled Demonstration           Education Topics: Hypertension, Hypertension Reduction -Define heart disease and high blood pressure. Discus how high blood pressure affects the body and ways to reduce high blood pressure.   Exercise and Your Heart -Discuss why it is important to exercise, the FITT principles of exercise, normal and abnormal responses to exercise, and how to exercise safely.   Angina -Discuss definition of  angina, causes of angina, treatment of angina, and how to decrease risk  of having angina.   Cardiac Medications -Review what the following cardiac medications are used for, how they affect the body, and side effects that may occur when taking the medications.  Medications include Aspirin, Beta blockers, calcium channel blockers, ACE Inhibitors, angiotensin receptor blockers, diuretics, digoxin, and antihyperlipidemics.   Congestive Heart Failure -Discuss the definition of CHF, how to live with CHF, the signs and symptoms of CHF, and how keep track of weight and sodium intake.   Heart Disease and Intimacy -Discus the effect sexual activity has on the heart, how changes occur during intimacy as we age, and safety during sexual activity.   Smoking Cessation / COPD -Discuss different methods to quit smoking, the health benefits of quitting smoking, and the definition of COPD.   Nutrition I: Fats -Discuss the types of cholesterol, what cholesterol does to the heart, and how cholesterol levels can be controlled.   Nutrition II: Labels -Discuss the different components of food labels and how to read food label   Heart Parts/Heart Disease and PAD -Discuss the anatomy of the heart, the pathway of blood circulation through the heart, and these are affected by heart disease.   Stress I: Signs and Symptoms -Discuss the causes of stress, how stress may lead to anxiety and depression, and ways to limit stress.   Stress II: Relaxation -Discuss different types of relaxation techniques to limit stress.   Warning Signs of Stroke / TIA -Discuss definition of a stroke, what the signs and symptoms are of a stroke, and how to identify when someone is having stroke.   Knowledge Questionnaire Score:  Knowledge Questionnaire Score - 04/22/20 1311      Knowledge Questionnaire Score   Pre Score 25/28           Core Components/Risk Factors/Patient Goals at Admission:  Personal Goals and  Risk Factors at Admission - 04/22/20 1314      Core Components/Risk Factors/Patient Goals on Admission    Weight Management Weight Maintenance;Yes    Expected Outcomes Weight Maintenance: Understanding of the daily nutrition guidelines, which includes 25-35% calories from fat, 7% or less cal from saturated fats, less than 257m cholesterol, less than 1.5gm of sodium, & 5 or more servings of fruits and vegetables daily    Tobacco Cessation Yes    Number of packs per day 0.25 (about 5 Cigs a day)    Intervention Assist the participant in steps to quit. Provide individualized education and counseling about committing to Tobacco Cessation, relapse prevention, and pharmacological support that can be provided by physician.;OAdvice worker assist with locating and accessing local/national Quit Smoking programs, and support quit date choice.    Expected Outcomes Short Term: Will demonstrate readiness to quit, by selecting a quit date.;Short Term: Will quit all tobacco product use, adhering to prevention of relapse plan.;Long Term: Complete abstinence from all tobacco products for at least 12 months from quit date.    Hypertension Yes    Intervention Provide education on lifestyle modifcations including regular physical activity/exercise, weight management, moderate sodium restriction and increased consumption of fresh fruit, vegetables, and low fat dairy, alcohol moderation, and smoking cessation.    Expected Outcomes Short Term: Continued assessment and intervention until BP is < 140/943mHG in hypertensive participants. < 130/8062mG in hypertensive participants with diabetes, heart failure or chronic kidney disease.;Long Term: Maintenance of blood pressure at goal levels.    Lipids Yes    Intervention Provide education and support for participant on nutrition & aerobic/resistive exercise along  with prescribed medications to achieve LDL <72m, HDL >488m    Expected Outcomes Short Term:  Participant states understanding of desired cholesterol values and is compliant with medications prescribed. Participant is following exercise prescription and nutrition guidelines.;Long Term: Cholesterol controlled with medications as prescribed, with individualized exercise RX and with personalized nutrition plan. Value goals: LDL < 7093mHDL > 40 mg.    Stress Yes    Intervention Offer individual and/or small group education and counseling on adjustment to heart disease, stress management and health-related lifestyle change. Teach and support self-help strategies.;Refer participants experiencing significant psychosocial distress to appropriate mental health specialists for further evaluation and treatment. When possible, include family members and significant others in education/counseling sessions.    Expected Outcomes Short Term: Participant demonstrates changes in health-related behavior, relaxation and other stress management skills, ability to obtain effective social support, and compliance with psychotropic medications if prescribed.;Long Term: Emotional wellbeing is indicated by absence of clinically significant psychosocial distress or social isolation.           Core Components/Risk Factors/Patient Goals Review:    Core Components/Risk Factors/Patient Goals at Discharge (Final Review):    ITP Comments:  ITP Comments    Row Name 04/22/20 1057 04/27/20 1826         ITP Comments Dr TurRadford Pax, Medical Director 30 daqy ITP Review. MicNahshon to start cardiac rehab on 04/28/20             Comments: See ITP comments.MarBarnet PallN,BSN 04/27/2020 6:27 PM

## 2020-04-27 NOTE — Telephone Encounter (Signed)
Spoke with patient about his foot fungus concern. Stated to the patient that he could come in before physical that is sch for April and see one of the nurse practitioners. Pt said he would wait until phys sch on 06/23/2020,  to further discuss with provider about medication and treatment.

## 2020-04-28 ENCOUNTER — Encounter (HOSPITAL_COMMUNITY): Payer: 59

## 2020-04-28 ENCOUNTER — Other Ambulatory Visit: Payer: Self-pay | Admitting: *Deleted

## 2020-04-28 ENCOUNTER — Telehealth (HOSPITAL_COMMUNITY): Payer: Self-pay | Admitting: *Deleted

## 2020-04-28 ENCOUNTER — Ambulatory Visit (HOSPITAL_COMMUNITY): Payer: 59

## 2020-04-28 DIAGNOSIS — R072 Precordial pain: Secondary | ICD-10-CM

## 2020-04-28 NOTE — Telephone Encounter (Signed)
Spoke with Mr Rampey, will hold off on cardiac rehab until ETT completed and Dr Oval Linsey clears the patient to start exercise. Patient states understanding. Barnet Pall, RN,BSN 04/28/2020 10:02 AM

## 2020-04-29 ENCOUNTER — Other Ambulatory Visit: Payer: Self-pay | Admitting: Internal Medicine

## 2020-04-30 ENCOUNTER — Encounter (HOSPITAL_COMMUNITY): Payer: 59

## 2020-04-30 ENCOUNTER — Other Ambulatory Visit: Payer: Self-pay | Admitting: Internal Medicine

## 2020-04-30 ENCOUNTER — Telehealth (HOSPITAL_COMMUNITY): Payer: Self-pay | Admitting: *Deleted

## 2020-04-30 ENCOUNTER — Ambulatory Visit (HOSPITAL_COMMUNITY): Payer: 59

## 2020-04-30 MED FILL — PREGABALIN 50 MG CAPS: 50 | 30 days supply | Qty: 60 | Fill #0

## 2020-04-30 NOTE — Telephone Encounter (Signed)
Pregabalin refill

## 2020-04-30 NOTE — Telephone Encounter (Signed)
Close encounter 

## 2020-05-01 ENCOUNTER — Other Ambulatory Visit (HOSPITAL_COMMUNITY)
Admission: RE | Admit: 2020-05-01 | Discharge: 2020-05-01 | Disposition: A | Discharge status: Home / Self Care | Payer: 59 | Source: Ambulatory Visit | Attending: Cardiovascular Disease | Admitting: Cardiovascular Disease

## 2020-05-01 DIAGNOSIS — Z01812 Encounter for preprocedural laboratory examination: Secondary | ICD-10-CM | POA: Diagnosis not present

## 2020-05-01 DIAGNOSIS — Z20822 Contact with and (suspected) exposure to covid-19: Secondary | ICD-10-CM | POA: Insufficient documentation

## 2020-05-01 LAB — SARS CORONAVIRUS 2 (TAT 6-24 HRS): SARS Coronavirus 2: NEGATIVE

## 2020-05-03 ENCOUNTER — Ambulatory Visit (HOSPITAL_COMMUNITY): Payer: 59

## 2020-05-03 ENCOUNTER — Encounter (HOSPITAL_COMMUNITY): Payer: 59

## 2020-05-03 ENCOUNTER — Other Ambulatory Visit: Payer: Self-pay | Admitting: Internal Medicine

## 2020-05-03 ENCOUNTER — Other Ambulatory Visit: Payer: Self-pay

## 2020-05-03 MED ORDER — ALBUTEROL SULFATE HFA 108 (90 BASE) MCG/ACT IN AERS: 2.0000 | INHALATION_SPRAY | Freq: Four times a day (QID) | RESPIRATORY_TRACT | 2 refills | Status: DC | PRN

## 2020-05-03 MED FILL — ALBUTEROL SULFATE HFA 108 (: 108 (90 BAS | 25 days supply | Qty: 9 | Fill #0

## 2020-05-04 ENCOUNTER — Ambulatory Visit (HOSPITAL_COMMUNITY)
Admission: RE | Admit: 2020-05-04 | Discharge: 2020-05-04 | Disposition: A | Discharge status: Home / Self Care | Payer: 59 | Source: Ambulatory Visit | Attending: Internal Medicine | Admitting: Internal Medicine

## 2020-05-04 ENCOUNTER — Other Ambulatory Visit: Payer: Self-pay

## 2020-05-04 DIAGNOSIS — R072 Precordial pain: Secondary | ICD-10-CM | POA: Diagnosis not present

## 2020-05-04 LAB — EXERCISE TOLERANCE TEST
Estimated workload: 6.4 METS
Exercise duration (min): 4 min
Exercise duration (sec): 37 s
MPHR: 153 {beats}/min
Peak HR: 136 {beats}/min
Percent HR: 88 %
Rest HR: 77 {beats}/min

## 2020-05-05 ENCOUNTER — Ambulatory Visit (HOSPITAL_COMMUNITY): Payer: 59

## 2020-05-05 ENCOUNTER — Encounter (HOSPITAL_COMMUNITY): Payer: 59

## 2020-05-06 ENCOUNTER — Telehealth (HOSPITAL_COMMUNITY): Payer: Self-pay | Admitting: *Deleted

## 2020-05-06 NOTE — Telephone Encounter (Signed)
Pt completed Stress test on 3/8. Reviewed results and comment by Dr. Oval Linsey indicating that this was a low risk study and pt may return to cardiac rehab.  Called and left message for pt that he may resume on tomorrow 3/11.  Contact information provided for any additional questions. Cherre Huger, BSN Cardiac and Training and development officer

## 2020-05-07 ENCOUNTER — Encounter (HOSPITAL_COMMUNITY): Payer: 59

## 2020-05-07 ENCOUNTER — Ambulatory Visit (HOSPITAL_COMMUNITY): Payer: 59

## 2020-05-10 ENCOUNTER — Encounter (HOSPITAL_COMMUNITY): Payer: 59

## 2020-05-10 ENCOUNTER — Ambulatory Visit (HOSPITAL_COMMUNITY): Payer: 59

## 2020-05-10 ENCOUNTER — Telehealth (HOSPITAL_COMMUNITY): Payer: Self-pay | Admitting: *Deleted

## 2020-05-10 NOTE — Telephone Encounter (Signed)
Spoke with [name]. [name] plans to start exercise on Wednesday.Barnet Pall, RN,BSN 05/10/2020 9:25 AM

## 2020-05-12 ENCOUNTER — Encounter (HOSPITAL_COMMUNITY): Payer: 59

## 2020-05-12 ENCOUNTER — Encounter (HOSPITAL_COMMUNITY)
Admission: RE | Admit: 2020-05-12 | Discharge: 2020-05-12 | Disposition: A | Discharge status: Home / Self Care | Payer: 59 | Source: Ambulatory Visit | Attending: Cardiovascular Disease | Admitting: Cardiovascular Disease

## 2020-05-12 ENCOUNTER — Ambulatory Visit (HOSPITAL_COMMUNITY): Payer: 59

## 2020-05-12 ENCOUNTER — Other Ambulatory Visit: Payer: Self-pay

## 2020-05-12 DIAGNOSIS — Z95820 Peripheral vascular angioplasty status with implants and grafts: Secondary | ICD-10-CM | POA: Diagnosis not present

## 2020-05-12 DIAGNOSIS — Z955 Presence of coronary angioplasty implant and graft: Secondary | ICD-10-CM | POA: Diagnosis not present

## 2020-05-12 DIAGNOSIS — I2102 ST elevation (STEMI) myocardial infarction involving left anterior descending coronary artery: Secondary | ICD-10-CM | POA: Insufficient documentation

## 2020-05-12 NOTE — Progress Notes (Signed)
Daily Session Note  Patient Details  Name: Peter Sims MRN: [id number] Date of Birth: [birth year] Referring Provider:   Flowsheet Row CARDIAC REHAB PHASE II ORIENTATION from 04/22/2020 in [facility]  Referring Provider Peter Latch MD      Encounter Date: 05/12/2020  Check In:  Session Check In - 05/12/20 0902      Check-In   Supervising physician immediately available to respond to emergencies [facility] immediately available    Physician(s) Dr. Florencia Sims    Location [facility]    Staff Present Barnet Pall, RN, Milus Glazier, MS, EP-C, CCRP;Other;Olinty Celesta Aver, MS, ACSM CEP, Exercise Physiologist;Annedrea Sparks, RN, MHA   Peter Sims EP   Virtual Visit No    Medication changes reported     No    Fall or balance concerns reported    No    Tobacco Cessation No Change    Current number of cigarettes/nicotine per day     5    Warm-up and Cool-down Performed on first and last piece of equipment    Resistance Training Performed No    VAD Patient? No    PAD/SET Patient? No      Pain Assessment   Currently in Pain? No/denies    Pain Score 0-No pain    Multiple Pain Sites No           Capillary Blood Glucose: No results found for this or any previous visit (from the past 24 hour(s)).   Exercise Prescription Changes - 05/12/20 0900      Response to Exercise   Blood Pressure (Admit) 110/70    Blood Pressure (Exercise) 130/70    Blood Pressure (Exit) 112/70    Heart Rate (Admit) 95 bpm    Heart Rate (Exercise) 103 bpm    Heart Rate (Exit) 69 bpm    Rating of Perceived Exertion (Exercise) 11    Symptoms None    Comments Pt's first day in the [facility] program    Duration Progress to 30 minutes of  aerobic without signs/symptoms of physical distress    Intensity THRR unchanged      Progression   Progression Continue to progress workloads to maintain intensity without signs/symptoms of physical  distress.    Average METs 4.1      Resistance Training   Training Prescription No    Weight No weights on Wednesdays      Interval Training   Interval Training No      Recumbant Bike   Level 3    Minutes 15    METs 4.1      Arm Ergometer   Level 2    Watts 5    Minutes 15           Social History   Tobacco Use  Smoking Status Current Every Day Smoker  . Packs/day: 0.50  . Years: 45.00  . Pack years: 22.50  . Types: Cigarettes  Smokeless Tobacco Never Used  Tobacco Comment   04/22/20 Patient given 1-800-quit-now  and the link to [facility] virtual smoking cessation link    Goals Met:  Exercise tolerated well No report of cardiac concerns or symptoms  Goals Unmet:  Not Applicable  Comments: Peter Sims started cardiac rehab today.  Pt tolerated light exercise without difficulty. VSS, telemetry-Sinus Rhythm, asymptomatic.  Medication list reconciled. Pt denies barriers to medicaiton compliance.  PSYCHOSOCIAL ASSESSMENT:  PHQ-0. Pt exhibits positive coping skills, hopeful outlook with supportive family. No psychosocial  needs identified at this time, no psychosocial interventions necessary.    Pt enjoys reading.   Pt oriented to exercise equipment and routine.    Understanding verbalized.Peter Sims continues to smoke 5 cigarettes a day. Will continue to encourage smoking cessation.Barnet Pall, RN,BSN 05/12/2020 10:25 AM   Dr. Fransico Him is Medical Director for Cardiac Rehab at [facility].

## 2020-05-14 ENCOUNTER — Encounter (HOSPITAL_COMMUNITY): Payer: 59

## 2020-05-14 ENCOUNTER — Ambulatory Visit (HOSPITAL_COMMUNITY): Payer: 59

## 2020-05-17 ENCOUNTER — Ambulatory Visit (HOSPITAL_COMMUNITY): Payer: 59

## 2020-05-17 ENCOUNTER — Encounter (HOSPITAL_COMMUNITY)
Admission: RE | Admit: 2020-05-17 | Discharge: 2020-05-17 | Disposition: A | Discharge status: Home / Self Care | Payer: 59 | Source: Ambulatory Visit | Attending: Cardiovascular Disease | Admitting: Cardiovascular Disease

## 2020-05-17 ENCOUNTER — Encounter (HOSPITAL_COMMUNITY): Payer: 59

## 2020-05-17 ENCOUNTER — Other Ambulatory Visit: Payer: Self-pay

## 2020-05-17 DIAGNOSIS — Z95820 Peripheral vascular angioplasty status with implants and grafts: Secondary | ICD-10-CM | POA: Diagnosis not present

## 2020-05-17 DIAGNOSIS — Z955 Presence of coronary angioplasty implant and graft: Secondary | ICD-10-CM

## 2020-05-17 DIAGNOSIS — I2102 ST elevation (STEMI) myocardial infarction involving left anterior descending coronary artery: Secondary | ICD-10-CM | POA: Diagnosis not present

## 2020-05-19 ENCOUNTER — Other Ambulatory Visit: Payer: Self-pay

## 2020-05-19 ENCOUNTER — Encounter (HOSPITAL_COMMUNITY)
Admission: RE | Admit: 2020-05-19 | Discharge: 2020-05-19 | Disposition: A | Discharge status: Home / Self Care | Payer: 59 | Source: Ambulatory Visit | Attending: Cardiovascular Disease | Admitting: Cardiovascular Disease

## 2020-05-19 ENCOUNTER — Ambulatory Visit (HOSPITAL_COMMUNITY): Payer: 59

## 2020-05-19 ENCOUNTER — Encounter (HOSPITAL_COMMUNITY): Payer: 59

## 2020-05-19 DIAGNOSIS — Z955 Presence of coronary angioplasty implant and graft: Secondary | ICD-10-CM | POA: Diagnosis not present

## 2020-05-19 DIAGNOSIS — Z95820 Peripheral vascular angioplasty status with implants and grafts: Secondary | ICD-10-CM | POA: Diagnosis not present

## 2020-05-19 DIAGNOSIS — I2102 ST elevation (STEMI) myocardial infarction involving left anterior descending coronary artery: Secondary | ICD-10-CM | POA: Diagnosis not present

## 2020-05-21 ENCOUNTER — Encounter (HOSPITAL_COMMUNITY)
Admission: RE | Admit: 2020-05-21 | Discharge: 2020-05-21 | Disposition: A | Discharge status: Home / Self Care | Payer: 59 | Source: Ambulatory Visit | Attending: Cardiovascular Disease | Admitting: Cardiovascular Disease

## 2020-05-21 ENCOUNTER — Ambulatory Visit (HOSPITAL_COMMUNITY): Payer: 59

## 2020-05-21 ENCOUNTER — Encounter (HOSPITAL_COMMUNITY): Payer: 59

## 2020-05-21 ENCOUNTER — Other Ambulatory Visit: Payer: Self-pay

## 2020-05-21 DIAGNOSIS — I2102 ST elevation (STEMI) myocardial infarction involving left anterior descending coronary artery: Secondary | ICD-10-CM

## 2020-05-21 DIAGNOSIS — Z95820 Peripheral vascular angioplasty status with implants and grafts: Secondary | ICD-10-CM | POA: Diagnosis not present

## 2020-05-21 DIAGNOSIS — Z955 Presence of coronary angioplasty implant and graft: Secondary | ICD-10-CM | POA: Diagnosis not present

## 2020-05-21 NOTE — Progress Notes (Signed)
Reviewed home exercise Rx with patient today. Pt is currently walking at home daily for 30 minutes. Encouraged warm-up, cool-down, and stretching with walks. Reviewed THRR of 61-122 and keeping RPE between 11-13. Reviewed weather parameters for temperature and humidity for safe exercise outdoors. Reviewed S/S to terminate exercise and when to call MD vs 911. Reviewed NTG using teach-back method and pt voices carrying his NTG. Hydration encouraged. Pt verbalized understanding of the home exercise Rx and was provided a copy.   Lesly Rubenstein MS, ACSM-CEP, CCRP

## 2020-05-24 ENCOUNTER — Encounter (HOSPITAL_COMMUNITY): Payer: 59

## 2020-05-25 NOTE — Progress Notes (Signed)
Cardiac Individual Treatment Plan  Patient Details  Name: Peter Sims MRN: [id number] Date of Birth: 09-May-1950 Referring Provider:   Flowsheet Row CARDIAC REHAB PHASE II ORIENTATION from 04/22/2020 in [facility]  Referring Provider Skeet Latch MD      Initial Encounter Date:  [facility] from 04/22/2020 in [facility]  Date 04/22/20      Visit Diagnosis: STEMI 01/14/20   S/P DES LAD 01/14/20  S/P angioplasty with stent 01/13/20 emergent DES to mLAD   Patient's Home Medications on Admission:  Current Outpatient Medications:  .  acetaminophen (TYLENOL) 325 MG tablet, Take 2 tablets (650 mg total) by mouth every 4 (four) hours as needed for headache or mild pain., Disp: , Rfl:  .  albuterol (VENTOLIN HFA) 108 (90 Base) MCG/ACT inhaler, Inhale 2 puffs into the lungs every 6 (six) hours as needed for wheezing or shortness of breath., Disp: 8 g, Rfl: 2 .  aspirin 81 MG chewable tablet, Chew 1 tablet (81 mg total) by mouth daily., Disp: , Rfl:  .  atorvastatin (LIPITOR) 80 MG tablet, Take 1 tablet (80 mg total) by mouth daily., Disp: 30 tablet, Rfl: 6 .  buPROPion (WELLBUTRIN XL) 150 MG 24 hr tablet, Take 150 mg by mouth as needed., Disp: , Rfl:  .  Carboxymethylcellul-Glycerin (CLEAR EYES FOR DRY EYES OP), Apply 1 drop to eye daily as needed (for dry eyes)., Disp: , Rfl:  .  clopidogrel (PLAVIX) 75 MG tablet, TAKE 300 MG TIMES 1 DOSE TOMORROW AND THEN TAKE 1 TABLET DAILY, Disp: 93 tablet, Rfl: 3 .  cyclobenzaprine (FLEXERIL) 10 MG tablet, TAKE 1 TABLET (10 MG TOTAL) BY MOUTH AS NEEDED FOR MUSCLE SPASMS., Disp: 30 tablet, Rfl: 0 .  diclofenac Sodium (VOLTAREN) 1 % GEL, Apply 2 g topically 2 (two) times daily., Disp: , Rfl:  .  ipratropium-albuterol (DUONEB) 0.5-2.5 (3) MG/3ML SOLN, INHALE 1 VIAL (3MLS) USING NEBULIZER 4 TIMES PER DAY, Disp: 270 mL, Rfl: 11 .  losartan (COZAAR)  50 MG tablet, TAKE 1 TABLET BY MOUTH DAILY, Disp: 90 tablet, Rfl: 2 .  Magnesium 400 MG CAPS, Take 400 mg by mouth daily., Disp: 30 capsule, Rfl: 1 .  metoprolol tartrate (LOPRESSOR) 25 MG tablet, Take 0.5 tablets (12.5 mg total) by mouth 2 (two) times daily. (Patient taking differently: Take 12.5 mg by mouth daily.), Disp: 30 tablet, Rfl: 6 .  mometasone-formoterol (DULERA) 200-5 MCG/ACT AERO, Inhale 2 puffs into the lungs 2 (two) times daily., Disp: 1 each, Rfl: 3 .  montelukast (SINGULAIR) 10 MG tablet, TAKE 1 TABLET BY MOUTH EVERY DAY IN THE EVENING, Disp: 30 tablet, Rfl: 3 .  nicotine (NICODERM CQ - DOSED IN MG/24 HR) 7 mg/24hr patch, Place 1 patch (7 mg total) onto the skin at bedtime., Disp: 28 patch, Rfl: 0 .  nitroGLYCERIN (NITROSTAT) 0.4 MG SL tablet, Place 1 tablet (0.4 mg total) under the tongue every 5 (five) minutes as needed for chest pain., Disp: 25 tablet, Rfl: 4 .  pantoprazole (PROTONIX) 40 MG tablet, Take 1 tablet (40 mg total) by mouth daily. (Patient taking differently: Take 40 mg by mouth 2 (two) times daily. Patient says he is taking twice a day), Disp: 90 tablet, Rfl: 3 .  pregabalin (LYRICA) 50 MG capsule, TAKE 1 CAPSULE BY MOUTH TWICE DAILY, Disp: 60 capsule, Rfl: 3 .  Respiratory Therapy Supplies (FLUTTER) DEVI, Use as directed, Disp: 1 each, Rfl: 0 .  tamsulosin (FLOMAX) 0.4 MG CAPS capsule, Take 0.4 mg by mouth daily., Disp: , Rfl:   Past Medical History: Past Medical History:  Diagnosis Date  . Arthritis   . Bullous emphysema (HCC)    Severe Bilateral  . Cocaine abuse ([place])   . Colitis    with bleeding  . COPD (chronic obstructive pulmonary disease) ([place])   . Coronary artery disease   . Dyspnea   . GERD (gastroesophageal reflux disease)   . Headache    sinusitis  . Hypertension   . LBP (low back pain)    now chronic  . Lumbago   . Lung mass    Right Upper Lobe pleural based mass, negative  on PET Scan  . Neck pain    nerve pain   . Pneumonia    hx   . S/P angioplasty with stent 01/13/20 emergent DES to mLAD  01/15/2020  . Shortness of breath 03/03/2020  . Spine pain    nerve  . Therapeutic drug monitoring   . Tobacco abuse     Tobacco Use: Social History   Tobacco Use  Smoking Status Current Every Day Smoker  . Packs/day: 0.50  . Years: 45.00  . Pack years: 22.50  . Types: Cigarettes  Smokeless Tobacco Never Used  Tobacco Comment   04/22/20 Patient given 1-800-quit-now  and the link to [facility] virtual smoking cessation link    Labs: Recent Review Flowsheet Data    Labs for ITP Cardiac and Pulmonary Rehab Latest Ref Rng & Units 07/18/2018 03/24/2019 09/23/2019 12/02/2019 01/14/2020   Cholestrol 0 - 200 mg/dL - 216(H) 212(H) 227(H) 164   LDLCALC 0 - 99 mg/dL - 144(H) 145(H) 153(H) 91   HDL >40 mg/dL - 54 52 53 61   Trlycerides <150 mg/dL - 99 83 118 62   Hemoglobin A1c 4.8 - 5.6 % 6.4(H) 6.2(H) - - -   TCO2 0 - 100 mmol/L - - - - -      Capillary Blood Glucose: Lab Results  Component Value Date   GLUCAP 121 (H) 01/13/2020   GLUCAP 115 (H) 06/14/2010   GLUCAP 125 (H) 05/20/2010   GLUCAP 96 05/20/2010   GLUCAP 141 (H) 05/19/2010     Exercise Target Goals: Exercise Program Goal: Individual exercise prescription set using results from initial 6 min walk test and THRR while considering  patient's activity barriers and safety.   Exercise Prescription Goal: Starting with aerobic activity 30 plus minutes a day, 3 days per week for initial exercise prescription. Provide home exercise prescription and guidelines that participant acknowledges understanding prior to discharge.  Activity Barriers & Risk Stratification:  Activity Barriers & Cardiac Risk Stratification - 04/22/20 1211      Activity Barriers & Cardiac Risk Stratification   Activity Barriers Back Problems;Other (comment)    Comments Right foot nerve pain and numbness. Bilateral hand and elbow pain (carpel tunnel)    Cardiac Risk Stratification High   3.93  MET's          6 Minute Walk:  6 Minute Walk    Row Name 04/22/20 1208         6 Minute Walk   Phase Initial     Distance 1215 feet     Walk Time 6 minutes     # of Rest Breaks 0     MPH 2.3     METS 2.93     RPE 11     Perceived Dyspnea  1  VO2 Peak 10.26     Symptoms No     Resting HR 68 bpm     Resting BP 104/68     Resting Oxygen Saturation  97 %     Exercise Oxygen Saturation  during 6 min walk 99 %     Max Ex. HR 99 bpm     Max Ex. BP 112/60     2 Minute Post BP 102/62            Oxygen Initial Assessment:   Oxygen Re-Evaluation:   Oxygen Discharge (Final Oxygen Re-Evaluation):   Initial Exercise Prescription:  Initial Exercise Prescription - 04/22/20 1200      Date of Initial Exercise RX and Referring Provider   Date 04/22/20    Referring Provider Skeet Latch MD    Expected Discharge Date 06/18/20      NuStep   Level 3    SPM 80    Minutes 15    METs 1.8      Arm Ergometer   Level 2    Watts 30    RPM 60    Minutes 15    METs 2.5      Prescription Details   Frequency (times per week) 3    Duration Progress to 30 minutes of continuous aerobic without signs/symptoms of physical distress      Intensity   THRR 40-80% of Max Heartrate 61-122    Ratings of Perceived Exertion 11-13      Progression   Progression Continue progressive overload as per policy without signs/symptoms or physical distress.      Resistance Training   Training Prescription Yes    Weight 5    Reps 10-15           Perform Capillary Blood Glucose checks as needed.  Exercise Prescription Changes:  Exercise Prescription Changes    Row Name 05/12/20 0900 05/21/20 1000           Response to Exercise   Blood Pressure (Admit) 110/70 112/62      Blood Pressure (Exercise) 130/70 132/70      Blood Pressure (Exit) 112/70 106/70      Heart Rate (Admit) 95 bpm 98 bpm      Heart Rate (Exercise) 103 bpm 113 bpm      Heart Rate (Exit) 69 bpm 89 bpm       Rating of Perceived Exertion (Exercise) 11 11      Symptoms None None      Comments Pt's first day in the CRP2 program Reviewed METs and home exercise Rx      Duration Progress to 30 minutes of  aerobic without signs/symptoms of physical distress Continue with 30 min of aerobic exercise without signs/symptoms of physical distress.      Intensity THRR unchanged THRR unchanged             Progression   Progression Continue to progress workloads to maintain intensity without signs/symptoms of physical distress. Continue to progress workloads to maintain intensity without signs/symptoms of physical distress.      Average METs 4.1 4.2             Resistance Training   Training Prescription No No      Weight No weights on Wednesdays 5 lbs      Reps -- 10-15      Time -- 10 Minutes             Interval Training   Interval Training No  No             Recumbant Bike   Level 3 3      Minutes 15 15      METs 4.1 4.2             Arm Ergometer   Level 2 2.5      Watts 5 4      Minutes 15 15             Home Exercise Plan   Plans to continue exercise at -- [employer]      Frequency -- Add 3 additional days to program exercise sessions.      Initial Home Exercises Provided -- 05/21/20             Exercise Comments:  Exercise Comments    Row Name 05/12/20 1000 05/21/20 1018         Exercise Comments P's first day in the CRP2 program. Pt tolerated exercise session well with no complaints. Reviewed METs and Home exercise Rx with patient today. Pt verbalized understanding of the home exercise Rx and was provided a copy.             Exercise Goals and Review:  Exercise Goals    Row Name 04/22/20 1252             Exercise Goals   Increase Physical Activity Yes       Intervention Provide advice, education, support and counseling about physical activity/exercise needs.;Develop an individualized exercise prescription for aerobic and resistive training based on initial  evaluation findings, risk stratification, comorbidities and participant's personal goals.       Expected Outcomes Short Term: Attend rehab on a regular basis to increase amount of physical activity.;Long Term: Add in home exercise to make exercise part of routine and to increase amount of physical activity.;Long Term: Exercising regularly at least 3-5 days a week.       Increase Strength and Stamina Yes       Intervention Provide advice, education, support and counseling about physical activity/exercise needs.;Develop an individualized exercise prescription for aerobic and resistive training based on initial evaluation findings, risk stratification, comorbidities and participant's personal goals.       Expected Outcomes Short Term: Increase workloads from initial exercise prescription for resistance, speed, and METs.;Short Term: Perform resistance training exercises routinely during rehab and add in resistance training at home;Long Term: Improve cardiorespiratory fitness, muscular endurance and strength as measured by increased METs and functional capacity (6MWT)       Able to understand and use rate of perceived exertion (RPE) scale Yes       Intervention Provide education and explanation on how to use RPE scale       Expected Outcomes Short Term: Able to use RPE daily in rehab to express subjective intensity level;Long Term:  Able to use RPE to guide intensity level when exercising independently       Knowledge and understanding of Target Heart Rate Range (THRR) Yes       Intervention Provide education and explanation of THRR including how the numbers were predicted and where they are located for reference       Expected Outcomes Short Term: Able to state/look up THRR;Short Term: Able to use daily as guideline for intensity in rehab;Long Term: Able to use THRR to govern intensity when exercising independently       Understanding of Exercise Prescription Yes       Intervention Provide education,  explanation, and written materials on patient's individual exercise prescription       Expected Outcomes Short Term: Able to explain program exercise prescription;Long Term: Able to explain home exercise prescription to exercise independently              Exercise Goals Re-Evaluation :  Exercise Goals Re-Evaluation    Row Name 05/12/20 0957 05/12/20 0959 05/21/20 1017         Exercise Goal Re-Evaluation   Exercise Goals Review Increase Physical Activity;Increase Strength and Stamina;Able to understand and use rate of perceived exertion (RPE) scale;Knowledge and understanding of Target Heart Rate Range (THRR);Understanding of Exercise Prescription -- Increase Physical Activity;Increase Strength and Stamina;Able to understand and use rate of perceived exertion (RPE) scale;Knowledge and understanding of Target Heart Rate Range (THRR);Able to check pulse independently;Understanding of Exercise Prescription     Comments -- Pt's first day in the CRP2 program. Pt understands the exercise Rx, THRR and RPE scale. Reviewed METs and home exercise Rx. Pt is progressing well. Pt is also walking at home daily for 30 minutes.     Expected Outcomes -- Will continue to monitor patient and progress exercise workloads as tolerated. Pt will continue to walk at home 5-7x/week for 30-45 minutes.             Discharge Exercise Prescription (Final Exercise Prescription Changes):  Exercise Prescription Changes - 05/21/20 1000      Response to Exercise   Blood Pressure (Admit) 112/62    Blood Pressure (Exercise) 132/70    Blood Pressure (Exit) 106/70    Heart Rate (Admit) 98 bpm    Heart Rate (Exercise) 113 bpm    Heart Rate (Exit) 89 bpm    Rating of Perceived Exertion (Exercise) 11    Symptoms None    Comments Reviewed METs and home exercise Rx    Duration Continue with 30 min of aerobic exercise without signs/symptoms of physical distress.    Intensity THRR unchanged      Progression   Progression  Continue to progress workloads to maintain intensity without signs/symptoms of physical distress.    Average METs 4.2      Resistance Training   Training Prescription No    Weight 5 lbs    Reps 10-15    Time 10 Minutes      Interval Training   Interval Training No      Recumbant Bike   Level 3    Minutes 15    METs 4.2      Arm Ergometer   Level 2.5    Watts 4    Minutes 15      Home Exercise Plan   Plans to continue exercise at [place] 3 additional days to program exercise sessions.    Initial Home Exercises Provided 05/21/20           Nutrition:  Target Goals: Understanding of nutrition guidelines, daily intake of sodium <1583m, cholesterol <2025m calories 30% from fat and 7% or less from saturated fats, daily to have 5 or more servings of fruits and vegetables.  Biometrics:  Pre Biometrics - 04/22/20 1259      Pre Biometrics   Height _0  (1.803 m)    Weight 84.5 kg    Waist Circumference 40.5 inches    Hip Circumference 42 inches    Waist to Hip Ratio 0.96 %    BMI (Calculated) 25.99    Triceps Skinfold 20 mm    %  Body Fat 28.1 %    Grip Strength 57 kg    Flexibility 13.5 in    Single Leg Stand 15 seconds            Nutrition Therapy Plan and Nutrition Goals:   Nutrition Assessments:  MEDIFICTS Score Key:  ?70 Need to make dietary changes   40-70 Heart Healthy Diet  ? 40 Therapeutic Level Cholesterol Diet   Picture Your Plate Scores:  <50 Unhealthy dietary pattern with much room for improvement.  41-50 Dietary pattern unlikely to meet recommendations for good health and room for improvement.  51-60 More healthful dietary pattern, with some room for improvement.   >60 Healthy dietary pattern, although there may be some specific behaviors that could be improved.    Nutrition Goals Re-Evaluation:   Nutrition Goals Discharge (Final Nutrition Goals Re-Evaluation):   Psychosocial: Target Goals: Acknowledge  presence or absence of significant depression and/or stress, maximize coping skills, provide positive support system. Participant is able to verbalize types and ability to use techniques and skills needed for reducing stress and depression.  Initial Review & Psychosocial Screening:  Initial Psych Review & Screening - 04/22/20 1511      Initial Review   Current issues with Current Stress Concerns    Source of Stress Concerns Family    Comments Kelton has a sister who has stage four terminal [place]? Yes   Mehmet has his wife, children and siblings for support     Barriers   Psychosocial barriers to participate in program The patient should benefit from training in stress management and relaxation.      Screening Interventions   Interventions Encouraged to exercise    Expected Outcomes Short Term goal: Utilizing psychosocial counselor, staff and physician to assist with identification of specific Stressors or current issues interfering with healing process. Setting desired goal for each stressor or current issue identified.;Long Term Goal: Stressors or current issues are controlled or eliminated.           Quality of Life Scores:  Quality of Life - 04/22/20 1310      Quality of Life   Select Quality of Life      Quality of Life Scores   Health/Function Pre 27.5 %    Socioeconomic Pre 25.56 %    Psych/Spiritual Pre 28.29 %    Family Pre 30 %    GLOBAL Pre 27.57 %          Scores of 19 and below usually indicate a poorer quality of life in these areas.  A difference of  2-3 points is a clinically meaningful difference.  A difference of 2-3 points in the total score of the Quality of Life Index has been associated with significant improvement in overall quality of life, self-image, physical symptoms, and general health in studies assessing change in quality of life.  PHQ-9: Recent Review Flowsheet Data    Depression screen [facility] 2/9  04/22/2020 09/23/2019 10/29/2018 06/05/2018 04/19/2018   Decreased Interest 0 2 0 0 0   Down, Depressed, Hopeless 0 2 0 0 0   PHQ - 2 Score 0 4 0 0 0   Altered sleeping - 2 - - -   Tired, decreased energy - 2 - - -   Change in appetite - 1 - - -   Feeling bad or failure about yourself  - 0 - - -   Trouble concentrating - 0 - - -  Moving slowly or fidgety/restless - 0 - - -   Suicidal thoughts - 0 - - -   PHQ-9 Score - 9 - - -   Difficult doing work/chores - Somewhat difficult - - -     Interpretation of Total Score  Total Score Depression Severity:  1-4 = Minimal depression, 5-9 = Mild depression, 10-14 = Moderate depression, 15-19 = Moderately severe depression, 20-27 = Severe depression   Psychosocial Evaluation and Intervention:   Psychosocial Re-Evaluation:  Psychosocial Re-Evaluation    Row Name 05/25/20 1500             Psychosocial Re-Evaluation   Current issues with Current Stress Concerns       Comments Clayton has not voiced any increased stressors or concerns       Expected Outcomes Estil will have decreased stress upon completion of phase 2 cardiac rehab       Interventions Stress management education;Encouraged to attend Cardiac Rehabilitation for the exercise       Continue Psychosocial Services  No Follow up required       Comments Sister has stage 4 cancer               Initial Review   Source of Stress Concerns Family              Psychosocial Discharge (Final Psychosocial Re-Evaluation):  Psychosocial Re-Evaluation - 05/25/20 1500      Psychosocial Re-Evaluation   Current issues with Current Stress Concerns    Comments Keagen has not voiced any increased stressors or concerns    Expected Outcomes [place] will have decreased stress upon completion of phase 2 cardiac rehab    Interventions Stress management education;Encouraged to attend Cardiac Rehabilitation for the exercise    Continue Psychosocial Services  No Follow up required    Comments  Sister has stage 4 cancer      Initial Review   Source of Stress Concerns Family           Vocational Rehabilitation: Provide vocational rehab assistance to qualifying candidates.   Vocational Rehab Evaluation & Intervention:  Vocational Rehab - 04/22/20 1513      Initial Vocational Rehab Evaluation & Intervention   Assessment shows need for Vocational Rehabilitation No      Vocational Rehab Re-Evaulation   Comments Wadie is retired and does not need vocational rehab at this time           Education: Education Goals: Education classes will be provided on a weekly basis, covering required topics. Participant will state understanding/return demonstration of topics presented.  Learning Barriers/Preferences:  Learning Barriers/Preferences - 04/22/20 1311      Learning Barriers/Preferences   Learning Barriers Sight    Learning Preferences Written Material;Pictoral;Skilled Demonstration           Education Topics: Hypertension, Hypertension Reduction -Define heart disease and high blood pressure. Discus how high blood pressure affects the body and ways to reduce high blood pressure.   Exercise and Your Heart -Discuss why it is important to exercise, the FITT principles of exercise, normal and abnormal responses to exercise, and how to exercise safely.   Angina -Discuss definition of angina, causes of angina, treatment of angina, and how to decrease risk of having angina.   Cardiac Medications -Review what the following cardiac medications are used for, how they affect the body, and side effects that may occur when taking the medications.  Medications include Aspirin, Beta blockers, calcium channel blockers, ACE Inhibitors, angiotensin receptor  blockers, diuretics, digoxin, and antihyperlipidemics.   Congestive Heart Failure -Discuss the definition of CHF, how to live with CHF, the signs and symptoms of CHF, and how keep track of weight and sodium  intake.   Heart Disease and Intimacy -Discus the effect sexual activity has on the heart, how changes occur during intimacy as we age, and safety during sexual activity.   Smoking Cessation / COPD -Discuss different methods to quit smoking, the health benefits of quitting smoking, and the definition of COPD.   Nutrition I: Fats -Discuss the types of cholesterol, what cholesterol does to the heart, and how cholesterol levels can be controlled.   Nutrition II: Labels -Discuss the different components of food labels and how to read food label   Heart Parts/Heart Disease and PAD -Discuss the anatomy of the heart, the pathway of blood circulation through the heart, and these are affected by heart disease.   Stress I: Signs and Symptoms -Discuss the causes of stress, how stress may lead to anxiety and depression, and ways to limit stress.   Stress II: Relaxation -Discuss different types of relaxation techniques to limit stress.   Warning Signs of Stroke / TIA -Discuss definition of a stroke, what the signs and symptoms are of a stroke, and how to identify when someone is having stroke.   Knowledge Questionnaire Score:  Knowledge Questionnaire Score - 04/22/20 1311      Knowledge Questionnaire Score   Pre Score 25/28           Core Components/Risk Factors/Patient Goals at Admission:  Personal Goals and Risk Factors at Admission - 04/22/20 1314      Core Components/Risk Factors/Patient Goals on Admission    Weight Management Weight Maintenance;Yes    Expected Outcomes Weight Maintenance: Understanding of the daily nutrition guidelines, which includes 25-35% calories from fat, 7% or less cal from saturated fats, less than 281m cholesterol, less than 1.5gm of sodium, & 5 or more servings of fruits and vegetables daily    Tobacco Cessation Yes    Number of packs per day 0.25 (about 5 Cigs a day)    Intervention Assist the participant in steps to quit. Provide individualized  education and counseling about committing to Tobacco Cessation, relapse prevention, and pharmacological support that can be provided by physician.;OAdvice worker assist with locating and accessing local/national Quit Smoking programs, and support quit date choice.    Expected Outcomes Short Term: Will demonstrate readiness to quit, by selecting a quit date.;Short Term: Will quit all tobacco product use, adhering to prevention of relapse plan.;Long Term: Complete abstinence from all tobacco products for at least 12 months from quit date.    Hypertension Yes    Intervention Provide education on lifestyle modifcations including regular physical activity/exercise, weight management, moderate sodium restriction and increased consumption of fresh fruit, vegetables, and low fat dairy, alcohol moderation, and smoking cessation.    Expected Outcomes Short Term: Continued assessment and intervention until BP is < 140/972mHG in hypertensive participants. < 130/8067mG in hypertensive participants with diabetes, heart failure or chronic kidney disease.;Long Term: Maintenance of blood pressure at goal levels.    Lipids Yes    Intervention Provide education and support for participant on nutrition & aerobic/resistive exercise along with prescribed medications to achieve LDL <56m50mDL >40mg77m Expected Outcomes Short Term: Participant states understanding of desired cholesterol values and is compliant with medications prescribed. Participant is following exercise prescription and nutrition guidelines.;Long Term: Cholesterol controlled with medications as  prescribed, with individualized exercise RX and with personalized nutrition plan. Value goals: LDL < 57m, HDL > 40 mg.    Stress Yes    Intervention Offer individual and/or small group education and counseling on adjustment to heart disease, stress management and health-related lifestyle change. Teach and support self-help strategies.;Refer  participants experiencing significant psychosocial distress to appropriate mental health specialists for further evaluation and treatment. When possible, include family members and significant others in education/counseling sessions.    Expected Outcomes Short Term: Participant demonstrates changes in health-related behavior, relaxation and other stress management skills, ability to obtain effective social support, and compliance with psychotropic medications if prescribed.;Long Term: Emotional wellbeing is indicated by absence of clinically significant psychosocial distress or social isolation.           Core Components/Risk Factors/Patient Goals Review:   Goals and Risk Factor Review    Row Name 05/25/20 1502             Core Components/Risk Factors/Patient Goals Review   Personal Goals Review Weight Management/Obesity;Hypertension;Lipids;Tobacco Cessation;Stress       Review MClarkson has been doing well with exercise. Manual's vital signs have been stable. Will continue to encourage smoking cessation as MRyucontinues to smoke       Expected Outcomes MSimranwill continue to participate in phase 2 cardiac rehab for exercise, nutrition and lifestyle modificatons.              Core Components/Risk Factors/Patient Goals at Discharge (Final Review):   Goals and Risk Factor Review - 05/25/20 1502      Core Components/Risk Factors/Patient Goals Review   Personal Goals Review Weight Management/Obesity;Hypertension;Lipids;Tobacco Cessation;Stress    Review MTejay has been doing well with exercise. Mivaan's vital signs have been stable. Will continue to encourage smoking cessation as MWadiecontinues to smoke    Expected Outcomes MStephaunwill continue to participate in phase 2 cardiac rehab for exercise, nutrition and lifestyle modificatons.           ITP Comments:  ITP Comments    Row Name 04/22/20 1057 04/27/20 1826 05/25/20 1459       ITP Comments Dr TRadford PaxMD, Medical  Director 30 daqy ITP Review. MRianis to start cardiac rehab on 04/28/20 30 day ITP Review MAndronhas good attendance and participation in phase 2 cardiac rehab            Comments: See ITP comments. MCatalinohas not had any reports of angina.MBarnet Pall RN,BSN 05/25/2020 3:08 PM

## 2020-05-26 ENCOUNTER — Encounter (HOSPITAL_COMMUNITY): Payer: 59

## 2020-05-26 ENCOUNTER — Other Ambulatory Visit: Payer: Self-pay

## 2020-05-26 ENCOUNTER — Encounter (HOSPITAL_COMMUNITY)
Admission: RE | Admit: 2020-05-26 | Discharge: 2020-05-26 | Disposition: A | Discharge status: Home / Self Care | Payer: 59 | Source: Ambulatory Visit | Attending: Cardiovascular Disease | Admitting: Cardiovascular Disease

## 2020-05-26 DIAGNOSIS — I2102 ST elevation (STEMI) myocardial infarction involving left anterior descending coronary artery: Secondary | ICD-10-CM

## 2020-05-26 DIAGNOSIS — Z955 Presence of coronary angioplasty implant and graft: Secondary | ICD-10-CM

## 2020-05-26 DIAGNOSIS — Z95820 Peripheral vascular angioplasty status with implants and grafts: Secondary | ICD-10-CM | POA: Diagnosis not present

## 2020-05-26 NOTE — Progress Notes (Signed)
Peter Sims 70 y.o. male Nutrition Note  Diagnosis: STEMI DES LAD  Past Medical History:  Diagnosis Date  . Arthritis   . Bullous emphysema (HCC)    Severe Bilateral  . Cocaine abuse ([place])   . Colitis    with bleeding  . COPD (chronic obstructive pulmonary disease) ([place])   . Coronary artery disease   . Dyspnea   . GERD (gastroesophageal reflux disease)   . Headache    sinusitis  . Hypertension   . LBP (low back pain)    now chronic  . Lumbago   . Lung mass    Right Upper Lobe pleural based mass, negative  on PET Scan  . Neck pain    nerve pain   . Pneumonia    hx  . S/P angioplasty with stent 01/13/20 emergent DES to mLAD  01/15/2020  . Shortness of breath 03/03/2020  . Spine pain    nerve  . Therapeutic drug monitoring   . Tobacco abuse      Medications reviewed.   Current Outpatient Medications:  .  acetaminophen (TYLENOL) 325 MG tablet, Take 2 tablets (650 mg total) by mouth every 4 (four) hours as needed for headache or mild pain., Disp: , Rfl:  .  albuterol (VENTOLIN HFA) 108 (90 Base) MCG/ACT inhaler, Inhale 2 puffs into the lungs every 6 (six) hours as needed for wheezing or shortness of breath., Disp: 8 g, Rfl: 2 .  aspirin 81 MG chewable tablet, Chew 1 tablet (81 mg total) by mouth daily., Disp: , Rfl:  .  atorvastatin (LIPITOR) 80 MG tablet, Take 1 tablet (80 mg total) by mouth daily., Disp: 30 tablet, Rfl: 6 .  buPROPion (WELLBUTRIN XL) 150 MG 24 hr tablet, Take 150 mg by mouth as needed., Disp: , Rfl:  .  Carboxymethylcellul-Glycerin (CLEAR EYES FOR DRY EYES OP), Apply 1 drop to eye daily as needed (for dry eyes)., Disp: , Rfl:  .  clopidogrel (PLAVIX) 75 MG tablet, TAKE 300 MG TIMES 1 DOSE TOMORROW AND THEN TAKE 1 TABLET DAILY, Disp: 93 tablet, Rfl: 3 .  cyclobenzaprine (FLEXERIL) 10 MG tablet, TAKE 1 TABLET (10 MG TOTAL) BY MOUTH AS NEEDED FOR MUSCLE SPASMS., Disp: 30 tablet, Rfl: 0 .  diclofenac Sodium (VOLTAREN) 1 % GEL, Apply 2 g topically 2  (two) times daily., Disp: , Rfl:  .  ipratropium-albuterol (DUONEB) 0.5-2.5 (3) MG/3ML SOLN, INHALE 1 VIAL (3MLS) USING NEBULIZER 4 TIMES PER DAY, Disp: 270 mL, Rfl: 11 .  losartan (COZAAR) 50 MG tablet, TAKE 1 TABLET BY MOUTH DAILY, Disp: 90 tablet, Rfl: 2 .  Magnesium 400 MG CAPS, Take 400 mg by mouth daily., Disp: 30 capsule, Rfl: 1 .  metoprolol tartrate (LOPRESSOR) 25 MG tablet, Take 0.5 tablets (12.5 mg total) by mouth 2 (two) times daily. (Patient taking differently: Take 12.5 mg by mouth daily.), Disp: 30 tablet, Rfl: 6 .  mometasone-formoterol (DULERA) 200-5 MCG/ACT AERO, Inhale 2 puffs into the lungs 2 (two) times daily., Disp: 1 each, Rfl: 3 .  montelukast (SINGULAIR) 10 MG tablet, TAKE 1 TABLET BY MOUTH EVERY DAY IN THE EVENING, Disp: 30 tablet, Rfl: 3 .  nicotine (NICODERM CQ - DOSED IN MG/24 HR) 7 mg/24hr patch, Place 1 patch (7 mg total) onto the skin at bedtime., Disp: 28 patch, Rfl: 0 .  nitroGLYCERIN (NITROSTAT) 0.4 MG SL tablet, Place 1 tablet (0.4 mg total) under the tongue every 5 (five) minutes as needed for chest pain., Disp: 25 tablet, Rfl:  4 .  pantoprazole (PROTONIX) 40 MG tablet, Take 1 tablet (40 mg total) by mouth daily. (Patient taking differently: Take 40 mg by mouth 2 (two) times daily. Patient says he is taking twice a day), Disp: 90 tablet, Rfl: 3 .  pregabalin (LYRICA) 50 MG capsule, TAKE 1 CAPSULE BY MOUTH TWICE DAILY, Disp: 60 capsule, Rfl: 3 .  Respiratory Therapy Supplies (FLUTTER) DEVI, Use as directed, Disp: 1 each, Rfl: 0 .  tamsulosin (FLOMAX) 0.4 MG CAPS capsule, Take 0.4 mg by mouth daily., Disp: , Rfl:    Ht Readings from Last 1 Encounters:  04/26/20 6' (1.829 m)     Wt Readings from Last 3 Encounters:  04/26/20 172 lb (78 kg)  04/22/20 186 lb 4.6 oz (84.5 kg)  03/03/20 186 lb 12.8 oz (84.7 kg)     There is no height or weight on file to calculate BMI.   Social History   Tobacco Use  Smoking Status Current Every Day Smoker  . Packs/day:  0.50  . Years: 45.00  . Pack years: 22.50  . Types: Cigarettes  Smokeless Tobacco Never Used  Tobacco Comment   04/22/20 Patient given 1-800-quit-now  and the link to [facility] health virtual smoking cessation link     Lab Results  Component Value Date   CHOL 164 01/14/2020   Lab Results  Component Value Date   HDL 61 01/14/2020   Lab Results  Component Value Date   LDLCALC 91 01/14/2020   Lab Results  Component Value Date   TRIG 62 01/14/2020     Lab Results  Component Value Date   HGBA1C 6.2 (H) 03/24/2019     CBG (last 3)  No results for input(s): GLUCAP in the last 72 hours.   Nutrition Note  Spoke with pt. Nutrition Plan and Nutrition Survey goals reviewed with pt. Pt is following a Heart Healthy diet.   Pt has Pre-diabetes. Last A1c indicates blood glucose well-controlled.   Per discussion, pt does not use canned/convenience foods often. Pt does not add salt to food. Pt does not eat out frequently.  He has supportive wife and daughter that help with grocery shopping, cooking, and label reading.  He tries to eat a variety of fruits/vegetables daily, whole grains, and adequate water. He limits sodium and saturated fats. Pt reports smoking 5 cigarettes daily. He is working on quitting. Offered virtual smoking cessation course information.  Pt expressed understanding of the information reviewed.    Nutrition Diagnosis ? Food-and nutrition-related knowledge deficit related to lack of exposure to information as related to diagnosis of: ? CVD ? Pre-diabetes    Nutrition Intervention ? Pt's individual nutrition plan reviewed with pt. ? Benefits of adopting Heart Healthy diet discussed when Picture Your Plate reviewed.   ? Pt given handouts for: ? Nutrition I class ? Nutrition II class ?  ? Continue client-centered nutrition education by RD, as part of interdisciplinary care.  Goal(s) ? Pt to continue to limit refined carbohydrate intake   ? Pt to build a healthy  plate including vegetables, fruits, whole grains, and low-fat dairy products in a heart healthy meal plan.  Plan:   Will provide client-centered nutrition education as part of interdisciplinary care  Monitor and evaluate progress toward nutrition goal with team.   Michaele Offer, MS, RDN, LDN

## 2020-05-28 ENCOUNTER — Encounter (HOSPITAL_COMMUNITY)
Admission: RE | Admit: 2020-05-28 | Discharge: 2020-05-28 | Disposition: A | Discharge status: Home / Self Care | Payer: 59 | Source: Ambulatory Visit | Attending: Cardiovascular Disease | Admitting: Cardiovascular Disease

## 2020-05-28 ENCOUNTER — Encounter (HOSPITAL_COMMUNITY): Payer: 59

## 2020-05-28 ENCOUNTER — Other Ambulatory Visit: Payer: Self-pay

## 2020-05-28 DIAGNOSIS — Z955 Presence of coronary angioplasty implant and graft: Secondary | ICD-10-CM | POA: Diagnosis not present

## 2020-05-28 DIAGNOSIS — Z95820 Peripheral vascular angioplasty status with implants and grafts: Secondary | ICD-10-CM | POA: Diagnosis not present

## 2020-05-28 DIAGNOSIS — I2102 ST elevation (STEMI) myocardial infarction involving left anterior descending coronary artery: Secondary | ICD-10-CM

## 2020-05-28 MED FILL — FAMOTIDINE 40 MG TABLET: 40 | 30 days supply | Qty: 60 | Fill #1

## 2020-05-28 MED FILL — CLOPIDOGREL 75 MG TABLET: 75 | 90 days supply | Qty: 90 | Fill #1

## 2020-05-28 MED FILL — LOSARTAN POTASSIUM 50 MG TA: 50 | 30 days supply | Qty: 30 | Fill #2

## 2020-05-28 MED FILL — METOPROLOL TARTRATE 25 MG T: 25 | 90 days supply | Qty: 90 | Fill #2

## 2020-05-31 ENCOUNTER — Encounter (HOSPITAL_COMMUNITY): Payer: 59

## 2020-06-02 ENCOUNTER — Encounter (HOSPITAL_COMMUNITY)
Admission: RE | Admit: 2020-06-02 | Discharge: 2020-06-02 | Disposition: A | Discharge status: Home / Self Care | Payer: 59 | Source: Ambulatory Visit | Attending: Cardiovascular Disease | Admitting: Cardiovascular Disease

## 2020-06-02 ENCOUNTER — Encounter (HOSPITAL_COMMUNITY): Payer: 59

## 2020-06-02 ENCOUNTER — Other Ambulatory Visit: Payer: Self-pay

## 2020-06-02 DIAGNOSIS — Z95820 Peripheral vascular angioplasty status with implants and grafts: Secondary | ICD-10-CM | POA: Diagnosis not present

## 2020-06-02 DIAGNOSIS — Z955 Presence of coronary angioplasty implant and graft: Secondary | ICD-10-CM

## 2020-06-02 DIAGNOSIS — I2102 ST elevation (STEMI) myocardial infarction involving left anterior descending coronary artery: Secondary | ICD-10-CM | POA: Diagnosis not present

## 2020-06-04 ENCOUNTER — Other Ambulatory Visit: Payer: Self-pay

## 2020-06-04 ENCOUNTER — Encounter (HOSPITAL_COMMUNITY)
Admission: RE | Admit: 2020-06-04 | Discharge: 2020-06-04 | Disposition: A | Discharge status: Home / Self Care | Payer: 59 | Source: Ambulatory Visit | Attending: Cardiovascular Disease | Admitting: Cardiovascular Disease

## 2020-06-04 ENCOUNTER — Encounter (HOSPITAL_COMMUNITY): Payer: 59

## 2020-06-04 DIAGNOSIS — Z955 Presence of coronary angioplasty implant and graft: Secondary | ICD-10-CM

## 2020-06-04 DIAGNOSIS — Z95820 Peripheral vascular angioplasty status with implants and grafts: Secondary | ICD-10-CM | POA: Diagnosis not present

## 2020-06-04 DIAGNOSIS — I2102 ST elevation (STEMI) myocardial infarction involving left anterior descending coronary artery: Secondary | ICD-10-CM

## 2020-06-07 ENCOUNTER — Other Ambulatory Visit (HOSPITAL_COMMUNITY): Payer: Self-pay

## 2020-06-07 ENCOUNTER — Encounter (HOSPITAL_COMMUNITY): Payer: 59

## 2020-06-07 ENCOUNTER — Other Ambulatory Visit: Payer: Self-pay | Admitting: Internal Medicine

## 2020-06-07 MED ORDER — IPRATROPIUM-ALBUTEROL 0.5-2.5 (3) MG/3ML IN SOLN
3.0000 mL | Freq: Four times a day (QID) | RESPIRATORY_TRACT | 11 refills | Status: DC
  Filled 2020-06-07: qty 270, 23d supply, fill #0

## 2020-06-09 ENCOUNTER — Telehealth: Payer: Self-pay

## 2020-06-09 ENCOUNTER — Encounter (HOSPITAL_COMMUNITY): Payer: 59

## 2020-06-09 ENCOUNTER — Other Ambulatory Visit (HOSPITAL_COMMUNITY): Payer: Self-pay

## 2020-06-09 MED ORDER — IPRATROPIUM-ALBUTEROL 0.5-2.5 (3) MG/3ML IN SOLN
Freq: Four times a day (QID) | RESPIRATORY_TRACT | 99 refills | Status: DC
  Filled 2020-06-09: qty 270, 23d supply, fill #0

## 2020-06-09 NOTE — Telephone Encounter (Signed)
The pt scheduled for a virtual appointment tomorrow.

## 2020-06-10 ENCOUNTER — Encounter: Payer: Self-pay | Admitting: Nurse Practitioner

## 2020-06-10 ENCOUNTER — Other Ambulatory Visit (HOSPITAL_COMMUNITY): Payer: Self-pay

## 2020-06-10 ENCOUNTER — Other Ambulatory Visit: Payer: Self-pay

## 2020-06-10 ENCOUNTER — Telehealth (INDEPENDENT_AMBULATORY_CARE_PROVIDER_SITE_OTHER): Payer: 59 | Admitting: Nurse Practitioner

## 2020-06-10 VITALS — BP 111/82 | Temp 97.7°F | Ht 72.0 in | Wt 181.0 lb

## 2020-06-10 DIAGNOSIS — J441 Chronic obstructive pulmonary disease with (acute) exacerbation: Secondary | ICD-10-CM

## 2020-06-10 DIAGNOSIS — R0989 Other specified symptoms and signs involving the circulatory and respiratory systems: Secondary | ICD-10-CM | POA: Diagnosis not present

## 2020-06-10 MED ORDER — PREDNISONE 10 MG PO TABS
ORAL_TABLET | ORAL | 0 refills | Status: DC
  Filled 2020-06-10: qty 21, 6d supply, fill #0

## 2020-06-10 NOTE — Progress Notes (Signed)
Virtual Visit via failed video - telephone    I,Erica T Jackson,acting as a scribe for Minette Brine, FNP.,have documented all relevant documentation on the behalf of Minette Brine, FNP,as directed by  Minette Brine, FNP while in the presence of Minette Brine, [place].  This visit type was conducted due to national recommendations for restrictions regarding the COVID-19 Pandemic (e.g. social distancing) in an effort to limit this patient's exposure and mitigate transmission in our community.  Due to his co-morbid illnesses, this patient is at least at moderate risk for complications without adequate follow up.  This format is felt to be most appropriate for this patient at this time.  All issues noted in this document were discussed and addressed.  A limited physical exam was performed with this format.    This visit type was conducted due to national recommendations for restrictions regarding the COVID-19 Pandemic (e.g. social distancing) in an effort to limit this patient's exposure and mitigate transmission in our community.  Patients identity confirmed using two different identifiers.  This format is felt to be most appropriate for this patient at this time.  All issues noted in this document were discussed and addressed.  No physical exam was performed (except for noted visual exam findings with Video Visits).    Date:  06/10/2020   ID:  VALIANT DILLS, DOB 06-16-1950, MRN [id number]  Patient Location:  Home - spoke with Queen Blossom   Provider location:   Office    Chief Complaint:    History of Present Illness:    Peter Sims is a 70 y.o. male who presents via video conferencing for a telehealth visit today.    The patient does have symptoms concerning for COVID-19 infection (fever, chills, cough, or new shortness of breath).   He feels his COPD is acting up, he feels congested in his chest. Will have to occur at least once a year. He had been taking dulera but was  ineffective. He felt better with the brevespi. He has been using his albuterol nebulizer in the last week has used 2 times a day. He reports having some shortness of breath. He has a non productive cough with foam type secretions. The brevespi has been helpful.     Past Medical History:  Diagnosis Date  . Arthritis   . Bullous emphysema (HCC)    Severe Bilateral  . Cocaine abuse ([place])   . Colitis    with bleeding  . COPD (chronic obstructive pulmonary disease) ([place])   . Coronary artery disease   . Dyspnea   . GERD (gastroesophageal reflux disease)   . Headache    sinusitis  . Hypertension   . LBP (low back pain)    now chronic  . Lumbago   . Lung mass    Right Upper Lobe pleural based mass, negative  on PET Scan  . Neck pain    nerve pain   . Pneumonia    hx  . S/P angioplasty with stent 01/13/20 emergent DES to mLAD  01/15/2020  . Shortness of breath 03/03/2020  . Spine pain    nerve  . Therapeutic drug monitoring   . Tobacco abuse    Past Surgical History:  Procedure Laterality Date  . CARDIAC CATHETERIZATION    . COLONOSCOPY    . CORONARY/GRAFT ACUTE MI REVASCULARIZATION N/A 01/14/2020   Procedure: Coronary/Graft Acute MI Revascularization;  Surgeon: Sherren Mocha, MD;  Location: [place] [facility];  Service: Cardiovascular;  Laterality: N/A;  .  LEFT HEART CATH AND CORONARY ANGIOGRAPHY N/A 01/14/2020   Procedure: LEFT HEART CATH AND CORONARY ANGIOGRAPHY;  Surgeon: Sherren Mocha, MD;  Location: [place] [facility];  Service: Cardiovascular;  Laterality: N/A;  . LUMBAR LAMINECTOMY/DECOMPRESSION MICRODISCECTOMY Right 05/03/2016   Procedure: MICRODISCECTOMY LUMBAR FIVE- SACRAL ONE RIGHT;  Surgeon: Ashok Pall, MD;  Location: [facility];  Service: Neurosurgery;  Laterality: Right;  . NO PAST SURGERIES       Current Meds  Medication Sig  . acetaminophen (TYLENOL) 325 MG tablet Take 2 tablets (650 mg total) by mouth every 4 (four) hours as needed for headache or mild pain.  Marland Kitchen  albuterol (VENTOLIN HFA) 108 (90 Base) MCG/ACT inhaler INHALE 2 PUFFS INTO THE LUNGS EVERY 6 (SIX) HOURS AS NEEDED FOR WHEEZING OR SHORTNESS OF BREATH.  Marland Kitchen aspirin 81 MG chewable tablet Chew 1 tablet (81 mg total) by mouth daily.  Marland Kitchen atorvastatin (LIPITOR) 80 MG tablet TAKE 1 TABLET (80 MG TOTAL) BY MOUTH DAILY.  Marland Kitchen Blood Pressure Monitoring (OMRON 3 SERIES BP MONITOR) DEVI USE AS DIRECTED  . buPROPion (WELLBUTRIN XL) 150 MG 24 hr tablet Take 150 mg by mouth as needed.  . Carboxymethylcellul-Glycerin (CLEAR EYES FOR DRY EYES OP) Apply 1 drop to eye daily as needed (for dry eyes).  . clopidogrel (PLAVIX) 75 MG tablet TAKE 1 TABLET BY MOUTH DAILY. STOP BRIL[place]TA  . cyclobenzaprine (FLEXERIL) 10 MG tablet TAKE 1 TABLET (10 MG TOTAL) BY MOUTH AS NEEDED FOR MUSCLE SPASMS.  Marland Kitchen diclofenac Sodium (VOLTAREN) 1 % GEL Apply 2 g topically 2 (two) times daily.  . famotidine (PEPCID) 40 MG tablet TAKE 1 TABLET BY MOUTH TWICE DAILY.  Marland Kitchen ipratropium-albuterol (DUONEB) 0.5-2.5 (3) MG/3ML SOLN Take 3 mLs by nebulization 4 (four) times daily.  Marland Kitchen ipratropium-albuterol (DUONEB) 0.5-2.5 (3) MG/3ML SOLN Inhale 1 vial via nebulizer 4 times a day  . losartan (COZAAR) 50 MG tablet TAKE 1 TABLET BY MOUTH DAILY  . Magnesium 400 MG CAPS Take 400 mg by mouth daily.  . metoprolol tartrate (LOPRESSOR) 25 MG tablet TAKE 1/2 TABLET (12.5 MG TOTAL) BY MOUTH 2 (TWO) TIMES DAILY. (Patient taking differently: Take 12.5 mg by mouth daily.)  . metroNIDAZOLE (FLAGYL) 250 MG tablet TAKE 1 TABLET BY MOUTH THREE TIMES DAILY FOR 30 DAYS.  Marland Kitchen mometasone-formoterol (DULERA) 200-5 MCG/ACT AERO INHALE 2 PUFFS INTO THE LUNGS 2 (TWO) TIMES DAILY.  . montelukast (SINGULAIR) 10 MG tablet TAKE 1 TABLET BY MOUTH EVERY DAY IN THE EVENING  . nicotine (NICODERM CQ - DOSED IN MG/24 HR) 7 mg/24hr patch PLACE 1 PATCH (7 MG TOTAL) ONTO THE SKIN AT BEDTIME.  . nitroGLYCERIN (NITROSTAT) 0.4 MG SL tablet PLACE 1 TABLET (0.4 MG TOTAL) UNDER THE TONGUE EVERY 5 (FIVE)  MINUTES AS NEEDED FOR CHEST PAIN.  Marland Kitchen pantoprazole (PROTONIX) 40 MG tablet TAKE 1 TABLET (40 MG TOTAL) BY MOUTH DAILY.STOP OMEPRAZOLE (Patient taking differently: Take 40 mg by mouth 2 (two) times daily. Patient says he is taking twice a day)  . pregabalin (LYRICA) 50 MG capsule TAKE 1 CAPSULE BY MOUTH TWICE DAILY  . Respiratory Therapy Supplies (FLUTTER) DEVI Use as directed  . tamsulosin (FLOMAX) 0.4 MG CAPS capsule Take 0.4 mg by mouth daily.     Allergies:   Penicillins   Social History   Tobacco Use  . Smoking status: Current Every Day Smoker    Packs/day: 0.50    Years: 45.00    Pack years: 22.50    Types: Cigarettes  . Smokeless tobacco: Never  Used  . Tobacco comment: 04/22/20 Patient given 1-800-quit-now  and the link to [place] virtual smoking cessation link  Vaping Use  . Vaping Use: Never used  Substance Use Topics  . Alcohol use: Yes    Comment: scotch once a week  . Drug use: Yes    Types: Cocaine, Marijuana    Comment: crack cocaine quit 2003     Family Hx: The patient's family history includes Breast cancer in his sister; Cancer in his father; Emphysema in his mother; Heart failure in his sister; Lung cancer in his father. He was adopted.  ROS:   Please see the history of present illness.    ROS  All other systems reviewed and are negative.   Labs/Other Tests and Data Reviewed:    Recent Labs: 01/13/2020: ALT 28 03/02/2020: BUN 11; Creatinine, Ser 0.97; Hemoglobin 15.7; Platelets 262; Potassium 3.6; Sodium 138   Recent Lipid Panel Lab Results  Component Value Date/Time   CHOL 164 01/14/2020 01:28 AM   CHOL 227 (H) 12/02/2019 12:19 PM   TRIG 62 01/14/2020 01:28 AM   HDL 61 01/14/2020 01:28 AM   HDL 53 12/02/2019 12:19 PM   CHOLHDL 2.7 01/14/2020 01:28 AM   LDLCALC 91 01/14/2020 01:28 AM   LDLCALC 153 (H) 12/02/2019 12:19 PM    Wt Readings from Last 3 Encounters:  06/10/20 181 lb (82.1 kg)  04/26/20 172 lb (78 kg)  04/22/20 186 lb 4.6 oz (84.5  kg)     Exam:    Vital Signs:  BP 111/82 (BP Location: Left Arm, Patient Position: Sitting, Cuff Size: Small)   Temp 97.7 F (36.5 C) (Oral)   Ht 6' (1.829 m)   Wt 181 lb (82.1 kg)   BMI 24.55 kg/m     Physical Exam Vitals reviewed.  Constitutional:      General: He is not in acute distress.    Appearance: Normal appearance.  Pulmonary:     Effort: Pulmonary effort is normal. No respiratory distress.     Comments: Noted coughing during visit Neurological:     General: No focal deficit present.     Mental Status: He is alert and oriented to person, place, and time.     Cranial Nerves: No cranial nerve deficit.  Psychiatric:        Mood and Affect: Mood normal.        Behavior: Behavior normal.        Thought Content: Thought content normal.        Judgment: Judgment normal.     ASSESSMENT & PLAN:    1. Chest congestion  Likely related to his COPD  Will treat with steroid   2. COPD exacerbation ([place])  Will treat with oral steroid and he is encouraged to use his albuterol inhaler  - predniSONE (DELTASONE) 10 MG tablet; TAKE 6 TABS BY MOUTH ON DAY 1; 5 TABS ON DAY 2; 4 TABS ON DAY 3; 3 TABS ON DAY 4; 2 TABS ON DAY 5; 1 TAB ON DAY 6 THEN STOP (Patient not taking: Reported on 06/23/2020)  Dispense: 21 tablet; Refill: 0     COVID-19 Education: The signs and symptoms of COVID-19 were discussed with the patient and how to seek care for testing (follow up with PCP or arrange E-visit).  The importance of social distancing was discussed today.  Patient Risk:   After full review of this patients clinical status, I feel that they are at least moderate risk at this time.  Time:   Today, I have spent 10 minutes/ seconds with the patient with telehealth technology discussing above diagnoses.     Medication Adjustments/Labs and Tests Ordered: Current medicines are reviewed at length with the patient today.  Concerns regarding medicines are outlined above.   Tests  Ordered: No orders of the defined types were placed in this encounter.   Medication Changes: Meds ordered this encounter  Medications  . predniSONE (DELTASONE) 10 MG tablet    Sig: TAKE 6 TABS BY MOUTH ON DAY 1; 5 TABS ON DAY 2; 4 TABS ON DAY 3; 3 TABS ON DAY 4; 2 TABS ON DAY 5; 1 TAB ON DAY 6 THEN STOP    Dispense:  21 tablet    Refill:  0    Disposition:  Follow up prn  Signed, Minette Brine, FNP

## 2020-06-11 ENCOUNTER — Encounter (HOSPITAL_COMMUNITY): Payer: 59

## 2020-06-11 ENCOUNTER — Other Ambulatory Visit (HOSPITAL_COMMUNITY): Payer: Self-pay

## 2020-06-14 ENCOUNTER — Encounter (HOSPITAL_COMMUNITY)
Admission: RE | Admit: 2020-06-14 | Discharge: 2020-06-14 | Disposition: A | Discharge status: Home / Self Care | Payer: 59 | Source: Ambulatory Visit | Attending: Cardiovascular Disease | Admitting: Cardiovascular Disease

## 2020-06-14 ENCOUNTER — Encounter (HOSPITAL_COMMUNITY): Payer: 59

## 2020-06-14 ENCOUNTER — Other Ambulatory Visit (HOSPITAL_COMMUNITY): Payer: Self-pay

## 2020-06-14 ENCOUNTER — Other Ambulatory Visit: Payer: Self-pay

## 2020-06-14 DIAGNOSIS — Z95820 Peripheral vascular angioplasty status with implants and grafts: Secondary | ICD-10-CM | POA: Diagnosis not present

## 2020-06-14 DIAGNOSIS — I2102 ST elevation (STEMI) myocardial infarction involving left anterior descending coronary artery: Secondary | ICD-10-CM

## 2020-06-14 DIAGNOSIS — Z955 Presence of coronary angioplasty implant and graft: Secondary | ICD-10-CM | POA: Diagnosis not present

## 2020-06-16 ENCOUNTER — Other Ambulatory Visit (HOSPITAL_COMMUNITY): Payer: Self-pay

## 2020-06-16 ENCOUNTER — Encounter (HOSPITAL_COMMUNITY): Payer: 59

## 2020-06-16 ENCOUNTER — Other Ambulatory Visit: Payer: Self-pay | Admitting: Nurse Practitioner

## 2020-06-16 ENCOUNTER — Telehealth: Payer: Self-pay

## 2020-06-16 ENCOUNTER — Other Ambulatory Visit: Payer: Self-pay

## 2020-06-16 ENCOUNTER — Encounter (HOSPITAL_COMMUNITY)
Admission: RE | Admit: 2020-06-16 | Discharge: 2020-06-16 | Disposition: A | Discharge status: Home / Self Care | Payer: 59 | Source: Ambulatory Visit | Attending: Cardiovascular Disease | Admitting: Cardiovascular Disease

## 2020-06-16 DIAGNOSIS — Z95820 Peripheral vascular angioplasty status with implants and grafts: Secondary | ICD-10-CM | POA: Diagnosis not present

## 2020-06-16 DIAGNOSIS — I2102 ST elevation (STEMI) myocardial infarction involving left anterior descending coronary artery: Secondary | ICD-10-CM

## 2020-06-16 DIAGNOSIS — Z955 Presence of coronary angioplasty implant and graft: Secondary | ICD-10-CM

## 2020-06-16 MED ORDER — AZITHROMYCIN 250 MG PO TABS
ORAL_TABLET | ORAL | 0 refills | Status: AC
  Filled 2020-06-16: qty 6, 5d supply, fill #0

## 2020-06-16 NOTE — Telephone Encounter (Signed)
Pt called about an antibiotic JM sent to pharmacy

## 2020-06-18 ENCOUNTER — Other Ambulatory Visit (HOSPITAL_COMMUNITY): Payer: Self-pay

## 2020-06-18 ENCOUNTER — Encounter (HOSPITAL_COMMUNITY): Payer: 59

## 2020-06-21 ENCOUNTER — Other Ambulatory Visit (HOSPITAL_COMMUNITY): Payer: Self-pay

## 2020-06-21 ENCOUNTER — Ambulatory Visit (HOSPITAL_COMMUNITY): Payer: 59

## 2020-06-21 ENCOUNTER — Telehealth (HOSPITAL_COMMUNITY): Payer: Self-pay | Admitting: Internal Medicine

## 2020-06-22 ENCOUNTER — Telehealth (HOSPITAL_COMMUNITY): Payer: Self-pay | Admitting: *Deleted

## 2020-06-22 ENCOUNTER — Encounter (HOSPITAL_COMMUNITY): Payer: Self-pay | Admitting: *Deleted

## 2020-06-22 DIAGNOSIS — I2102 ST elevation (STEMI) myocardial infarction involving left anterior descending coronary artery: Secondary | ICD-10-CM

## 2020-06-22 DIAGNOSIS — Z955 Presence of coronary angioplasty implant and graft: Secondary | ICD-10-CM

## 2020-06-22 NOTE — Telephone Encounter (Signed)
Left message to call cardiac rehab 

## 2020-06-22 NOTE — Progress Notes (Signed)
Cardiac Individual Treatment Plan  Patient Details  Name: Peter Sims MRN: [id number] Date of Birth: 01/30/1951 Referring Provider:   Flowsheet Row CARDIAC REHAB PHASE II ORIENTATION from 04/22/2020 in [facility]  Referring Provider Skeet Latch MD      Initial Encounter Date:  [place] PHASE II ORIENTATION from 04/22/2020 in [facility]  Date 04/22/20      Visit Diagnosis: STEMI 01/14/20   S/P DES LAD 01/14/20  Patient's Home Medications on Admission:  Current Outpatient Medications:  .  acetaminophen (TYLENOL) 325 MG tablet, Take 2 tablets (650 mg total) by mouth every 4 (four) hours as needed for headache or mild pain., Disp: , Rfl:  .  albuterol (VENTOLIN HFA) 108 (90 Base) MCG/ACT inhaler, INHALE 2 PUFFS INTO THE LUNGS EVERY 6 (SIX) HOURS AS NEEDED FOR WHEEZING OR SHORTNESS OF BREATH., Disp: 8.5 g, Rfl: 2 .  aspirin 81 MG chewable tablet, Chew 1 tablet (81 mg total) by mouth daily., Disp: , Rfl:  .  atorvastatin (LIPITOR) 80 MG tablet, TAKE 1 TABLET (80 MG TOTAL) BY MOUTH DAILY., Disp: 30 tablet, Rfl: 6 .  Blood Pressure Monitoring (OMRON 3 SERIES BP MONITOR) DEVI, USE AS DIRECTED, Disp: 1 each, Rfl: 0 .  buPROPion (WELLBUTRIN XL) 150 MG 24 hr tablet, Take 150 mg by mouth as needed., Disp: , Rfl:  .  Carboxymethylcellul-Glycerin (CLEAR EYES FOR DRY EYES OP), Apply 1 drop to eye daily as needed (for dry eyes)., Disp: , Rfl:  .  ciprofloxacin (CIPRO) 500 MG tablet, TAKE 1 TABLET BY MOUTH EVERY 12 HOURS FOR 30 DAYS (Patient not taking: Reported on 06/10/2020), Disp: 60 tablet, Rfl: 0 .  clopidogrel (PLAVIX) 75 MG tablet, TAKE 1 TABLET BY MOUTH DAILY. STOP BRILINTA, Disp: 93 tablet, Rfl: 3 .  cyclobenzaprine (FLEXERIL) 10 MG tablet, TAKE 1 TABLET (10 MG TOTAL) BY MOUTH AS NEEDED FOR MUSCLE SPASMS., Disp: 30 tablet, Rfl: 0 .  diclofenac Sodium (VOLTAREN) 1 % GEL, Apply 2 g topically 2 (two)  times daily., Disp: , Rfl:  .  famotidine (PEPCID) 40 MG tablet, TAKE 1 TABLET BY MOUTH TWICE DAILY., Disp: 60 tablet, Rfl: 12 .  ipratropium-albuterol (DUONEB) 0.5-2.5 (3) MG/3ML SOLN, Take 3 mLs by nebulization 4 (four) times daily., Disp: 270 mL, Rfl: 11 .  ipratropium-albuterol (DUONEB) 0.5-2.5 (3) MG/3ML SOLN, Inhale 1 vial via nebulizer 4 times a day, Disp: 270 mL, Rfl: PRN .  losartan (COZAAR) 50 MG tablet, TAKE 1 TABLET BY MOUTH DAILY, Disp: 90 tablet, Rfl: 2 .  Magnesium 400 MG CAPS, Take 400 mg by mouth daily., Disp: 30 capsule, Rfl: 1 .  metoprolol tartrate (LOPRESSOR) 25 MG tablet, TAKE 1/2 TABLET (12.5 MG TOTAL) BY MOUTH 2 (TWO) TIMES DAILY. (Patient taking differently: Take 12.5 mg by mouth daily.), Disp: 30 tablet, Rfl: 6 .  metroNIDAZOLE (FLAGYL) 250 MG tablet, TAKE 1 TABLET BY MOUTH THREE TIMES DAILY FOR 30 DAYS., Disp: 90 tablet, Rfl: 0 .  mometasone-formoterol (DULERA) 200-5 MCG/ACT AERO, INHALE 2 PUFFS INTO THE LUNGS 2 (TWO) TIMES DAILY., Disp: 13 g, Rfl: 3 .  montelukast (SINGULAIR) 10 MG tablet, TAKE 1 TABLET BY MOUTH EVERY DAY IN THE EVENING, Disp: 30 tablet, Rfl: 3 .  nicotine (NICODERM CQ - DOSED IN MG/24 HR) 7 mg/24hr patch, PLACE 1 PATCH (7 MG TOTAL) ONTO THE SKIN AT BEDTIME., Disp: 28 patch, Rfl: 0 .  nitroGLYCERIN (NITROSTAT) 0.4 MG SL tablet, PLACE 1 TABLET (  0.4 MG TOTAL) UNDER THE TONGUE EVERY 5 (FIVE) MINUTES AS NEEDED FOR CHEST PAIN., Disp: 25 tablet, Rfl: 4 .  pantoprazole (PROTONIX) 40 MG tablet, TAKE 1 TABLET (40 MG TOTAL) BY MOUTH DAILY.STOP OMEPRAZOLE (Patient taking differently: Take 40 mg by mouth 2 (two) times daily. Patient says he is taking twice a day), Disp: 90 tablet, Rfl: 3 .  predniSONE (DELTASONE) 10 MG tablet, TAKE 6 TABS BY MOUTH ON DAY 1; 5 TABS ON DAY 2; 4 TABS ON DAY 3; 3 TABS ON DAY 4; 2 TABS ON DAY 5; 1 TAB ON DAY 6 THEN STOP, Disp: 21 tablet, Rfl: 0 .  pregabalin (LYRICA) 50 MG capsule, TAKE 1 CAPSULE BY MOUTH TWICE DAILY, Disp: 60 capsule, Rfl:  3 .  Respiratory Therapy Supplies (FLUTTER) DEVI, Use as directed, Disp: 1 each, Rfl: 0 .  tamsulosin (FLOMAX) 0.4 MG CAPS capsule, Take 0.4 mg by mouth daily., Disp: , Rfl:   Past Medical History: Past Medical History:  Diagnosis Date  . Arthritis   . Bullous emphysema (HCC)    Severe Bilateral  . Cocaine abuse ([place])   . Colitis    with bleeding  . COPD (chronic obstructive pulmonary disease) ([place])   . Coronary artery disease   . Dyspnea   . GERD (gastroesophageal reflux disease)   . Headache    sinusitis  . Hypertension   . LBP (low back pain)    now chronic  . Lumbago   . Lung mass    Right Upper Lobe pleural based mass, negative  on PET Scan  . Neck pain    nerve pain   . Pneumonia    hx  . S/P angioplasty with stent 01/13/20 emergent DES to mLAD  01/15/2020  . Shortness of breath 03/03/2020  . Spine pain    nerve  . Therapeutic drug monitoring   . Tobacco abuse     Tobacco Use: Social History   Tobacco Use  Smoking Status Current Every Day Smoker  . Packs/day: 0.50  . Years: 45.00  . Pack years: 22.50  . Types: Cigarettes  Smokeless Tobacco Never Used  Tobacco Comment   04/22/20 Patient given 1-800-quit-now  and the link to [facility] virtual smoking cessation link    Labs: Recent Review Flowsheet Data    Labs for ITP Cardiac and Pulmonary Rehab Latest Ref Rng & Units 07/18/2018 03/24/2019 09/23/2019 12/02/2019 01/14/2020   Cholestrol 0 - 200 mg/dL - 216(H) 212(H) 227(H) 164   LDLCALC 0 - 99 mg/dL - 144(H) 145(H) 153(H) 91   HDL >40 mg/dL - 54 52 53 61   Trlycerides <150 mg/dL - 99 83 118 62   Hemoglobin A1c 4.8 - 5.6 % 6.4(H) 6.2(H) - - -   TCO2 0 - 100 mmol/L - - - - -      Capillary Blood Glucose: Lab Results  Component Value Date   GLUCAP 121 (H) 01/13/2020   GLUCAP 115 (H) 06/14/2010   GLUCAP 125 (H) 05/20/2010   GLUCAP 96 05/20/2010   GLUCAP 141 (H) 05/19/2010     Exercise Target Goals: Exercise Program Goal: Individual exercise  prescription set using results from initial 6 min walk test and THRR while considering  patient's activity barriers and safety.   Exercise Prescription Goal: Starting with aerobic activity 30 plus minutes a day, 3 days per week for initial exercise prescription. Provide home exercise prescription and guidelines that participant acknowledges understanding prior to discharge.  Activity Barriers & Risk Stratification:  6 Minute Walk:   Oxygen Initial Assessment:   Oxygen Re-Evaluation:   Oxygen Discharge (Final Oxygen Re-Evaluation):   Initial Exercise Prescription:   Perform Capillary Blood Glucose checks as needed.  Exercise Prescription Changes:  Exercise Prescription Changes    Row Name 05/12/20 0900 05/21/20 1000 06/14/20 1112         Response to Exercise   Blood Pressure (Admit) 110/70 112/62 122/72     Blood Pressure (Exercise) 130/70 132/70 150/80     Blood Pressure (Exit) 112/70 106/70 124/68     Heart Rate (Admit) 95 bpm 98 bpm 86 bpm     Heart Rate (Exercise) 103 bpm 113 bpm 111 bpm     Heart Rate (Exit) 69 bpm 89 bpm 79 bpm     Rating of Perceived Exertion (Exercise) 11 11 11      Symptoms None None None     Comments Pt's first day in the CRP2 program Reviewed METs and home exercise Rx Reviewed METs and Goals     Duration Progress to 30 minutes of  aerobic without signs/symptoms of physical distress Continue with 30 min of aerobic exercise without signs/symptoms of physical distress. Continue with 30 min of aerobic exercise without signs/symptoms of physical distress.     Intensity THRR unchanged THRR unchanged THRR unchanged           Progression   Progression Continue to progress workloads to maintain intensity without signs/symptoms of physical distress. Continue to progress workloads to maintain intensity without signs/symptoms of physical distress. Continue to progress workloads to maintain intensity without signs/symptoms of physical distress.     Average  METs 4.1 4.2 4.5           Resistance Training   Training Prescription No No Yes     Weight No weights on Wednesdays 5 lbs 5 lbs     Reps -- 10-15 10-15     Time -- 10 Minutes 10 Minutes           Interval Training   Interval Training No No No           Recumbant Bike   Level 3 3 3      Minutes 15 15 15      METs 4.1 4.2 4.5           Arm Ergometer   Level 2 2.5 2.6     Watts 5 4 13      Minutes 15 15 15            Home Exercise Plan   Plans to continue exercise at -- [facility] (comment)     Frequency -- Add 3 additional days to program exercise sessions. Add 3 additional days to program exercise sessions.     Initial Home Exercises Provided -- 05/21/20 05/21/20            Exercise Comments:  Exercise Comments    Row Name 05/12/20 1000 05/21/20 1018 06/14/20 1117       Exercise Comments P's first day in the CRP2 program. Pt tolerated exercise session well with no complaints. Reviewed METs and Home exercise Rx with patient today. Pt verbalized understanding of the home exercise Rx and was provided a copy. Reviewed METs and Goals. Pt voices feeling stonger and more energetic. Pt voices having to stop half way thought cutting the grass and having to take a break. Pt was able to finish after rest break. Pt voices feeling stonger in the arms from using the arm ergometer.  Exercise Goals and Review:   Exercise Goals Re-Evaluation :  Exercise Goals Re-Evaluation    Row Name 05/12/20 0957 05/12/20 0959 05/21/20 1017 06/14/20 1115       Exercise Goal Re-Evaluation   Exercise Goals Review Increase Physical Activity;Increase Strength and Stamina;Able to understand and use rate of perceived exertion (RPE) scale;Knowledge and understanding of Target Heart Rate Range (THRR);Understanding of Exercise Prescription -- Increase Physical Activity;Increase Strength and Stamina;Able to understand and use rate of perceived exertion (RPE) scale;Knowledge and understanding  of Target Heart Rate Range (THRR);Able to check pulse independently;Understanding of Exercise Prescription Increase Physical Activity;Increase Strength and Stamina;Able to understand and use rate of perceived exertion (RPE) scale;Knowledge and understanding of Target Heart Rate Range (THRR);Able to check pulse independently;Understanding of Exercise Prescription    Comments -- Pt's first day in the CRP2 program. Pt understands the exercise Rx, THRR and RPE scale. Reviewed METs and home exercise Rx. Pt is progressing well. Pt is also walking at home daily for 30 minutes. Reviewed METs and Goals. Pt is progessing and working toward goal. Pt feels stonger and more energetic. Walking at home.    Expected Outcomes -- Will continue to monitor patient and progress exercise workloads as tolerated. Pt will continue to walk at home 5-7x/week for 30-45 minutes. Will continue to monitor and progress exercise workloads as tolerated.            Discharge Exercise Prescription (Final Exercise Prescription Changes):  Exercise Prescription Changes - 06/14/20 1112      Response to Exercise   Blood Pressure (Admit) 122/72    Blood Pressure (Exercise) 150/80    Blood Pressure (Exit) 124/68    Heart Rate (Admit) 86 bpm    Heart Rate (Exercise) 111 bpm    Heart Rate (Exit) 79 bpm    Rating of Perceived Exertion (Exercise) 11    Symptoms None    Comments Reviewed METs and Goals    Duration Continue with 30 min of aerobic exercise without signs/symptoms of physical distress.    Intensity THRR unchanged      Progression   Progression Continue to progress workloads to maintain intensity without signs/symptoms of physical distress.    Average METs 4.5      Resistance Training   Training Prescription Yes    Weight 5 lbs    Reps 10-15    Time 10 Minutes      Interval Training   Interval Training No      Recumbant Bike   Level 3    Minutes 15    METs 4.5      Arm Ergometer   Level 2.6    Watts 13     Minutes 15      Home Exercise Plan   Plans to continue exercise at Home (comment)    Frequency Add 3 additional days to program exercise sessions.    Initial Home Exercises Provided 05/21/20           Nutrition:  Target Goals: Understanding of nutrition guidelines, daily intake of sodium 1500mg , cholesterol 200mg , calories 30% from fat and 7% or less from saturated fats, daily to have 5 or more servings of fruits and vegetables.  Biometrics:    Nutrition Therapy Plan and Nutrition Goals:  Nutrition Therapy & Goals - 05/26/20 1446      Nutrition Therapy   Diet TLC    Drug/Food Interactions Statins/Certain Fruits      Personal Nutrition Goals   Nutrition Goal Pt to continue to  limit refined carbohydrate intake    Personal Goal #2 Pt to build a healthy plate including vegetables, fruits, whole grains, and low-fat dairy products in a heart healthy meal plan.      Intervention Plan   Intervention Prescribe, educate and counsel regarding individualized specific dietary modifications aiming towards targeted core components such as weight, hypertension, lipid management, diabetes, heart failure and other comorbidities.;Nutrition handout(s) given to patient.    Expected Outcomes Short Term Goal: Understand basic principles of dietary content, such as calories, fat, sodium, cholesterol and nutrients.;Long Term Goal: Adherence to prescribed nutrition plan.           Nutrition Assessments:  MEDIFICTS Score Key:  ?70 Need to make dietary changes   40-70 Heart Healthy Diet  ? 40 Therapeutic Level Cholesterol Diet  Flowsheet Row CARDIAC REHAB PHASE II EXERCISE from 06/16/2020 in [facility]  Picture Your Plate Total Score on Admission 56     Picture Your Plate Scores:  <02 Unhealthy dietary pattern with much room for improvement.  41-50 Dietary pattern unlikely to meet recommendations for good health and room for improvement.  51-60 More  healthful dietary pattern, with some room for improvement.   >60 Healthy dietary pattern, although there may be some specific behaviors that could be improved.    Nutrition Goals Re-Evaluation:  Nutrition Goals Re-Evaluation    [place] Name 05/26/20 1446 06/18/20 1203           Goals   Current Weight 172 lb (78 kg) 180 lb 8.9 oz (81.9 kg)      Nutrition Goal -- Pt to continue to limit refined carbohydrate intake             Personal Goal #2 Re-Evaluation   Personal Goal #2 -- Pt to build a healthy plate including vegetables, fruits, whole grains, and low-fat dairy products in a heart healthy meal plan.             Nutrition Goals Discharge (Final Nutrition Goals Re-Evaluation):  Nutrition Goals Re-Evaluation - 06/18/20 1203      Goals   Current Weight 180 lb 8.9 oz (81.9 kg)    Nutrition Goal Pt to continue to limit refined carbohydrate intake      Personal Goal #2 Re-Evaluation   Personal Goal #2 Pt to build a healthy plate including vegetables, fruits, whole grains, and low-fat dairy products in a heart healthy meal plan.           Psychosocial: Target Goals: Acknowledge presence or absence of significant depression and/or stress, maximize coping skills, provide positive support system. Participant is able to verbalize types and ability to use techniques and skills needed for reducing stress and depression.  Initial Review & Psychosocial Screening:   Quality of Life Scores:  Scores of 19 and below usually indicate a poorer quality of life in these areas.  A difference of  2-3 points is a clinically meaningful difference.  A difference of 2-3 points in the total score of the Quality of Life Index has been associated with significant improvement in overall quality of life, self-image, physical symptoms, and general health in studies assessing change in quality of life.  PHQ-9: Recent Review Flowsheet Data    Depression screen [facility] 2/9 04/22/2020 09/23/2019 10/29/2018 06/05/2018  04/19/2018   Decreased Interest 0 2 0 0 0   Down, Depressed, Hopeless 0 2 0 0 0   PHQ - 2 Score 0 4 0 0 0   Altered sleeping -  2 - - -   Tired, decreased energy - 2 - - -   Change in appetite - 1 - - -   Feeling bad or failure about yourself  - 0 - - -   Trouble concentrating - 0 - - -   Moving slowly or fidgety/restless - 0 - - -   Suicidal thoughts - 0 - - -   PHQ-9 Score - 9 - - -   Difficult doing work/chores - Somewhat difficult - - -     Interpretation of Total Score  Total Score Depression Severity:  1-4 = Minimal depression, 5-9 = Mild depression, 10-14 = Moderate depression, 15-19 = Moderately severe depression, 20-27 = Severe depression   Psychosocial Evaluation and Intervention:   Psychosocial Re-Evaluation:  Psychosocial Re-Evaluation    Row Name 05/25/20 1500 06/22/20 1210           Psychosocial Re-Evaluation   Current issues with Current Stress Concerns Current Stress Concerns      Comments Keldric has not voiced any increased stressors or concerns Lashan did voice some stress concerns regarding his grandaughter. Javante's granduaghter is staying with him and his wife now which has made him feel a little better      Expected Outcomes Rizwan will have decreased stress upon completion of phase 2 cardiac rehab Demarie will have decreased stress upon completion of phase 2 cardiac rehab      Interventions Stress management education;Encouraged to attend Cardiac Rehabilitation for the exercise Stress management education;Encouraged to attend Cardiac Rehabilitation for the exercise      Continue Psychosocial Services  No Follow up required No Follow up required      Comments Sister has stage 4 cancer Sister has stage 4 cancer, Helping teenage grandaughter             Initial Review   Source of Stress Concerns Family Family             Psychosocial Discharge (Final Psychosocial Re-Evaluation):  Psychosocial Re-Evaluation - 06/22/20 1210      Psychosocial  Re-Evaluation   Current issues with Current Stress Concerns    Comments Apolo did voice some stress concerns regarding his grandaughter. Arlo's granduaghter is staying with him and his wife now which has made him feel a little better    Expected Outcomes Bodi will have decreased stress upon completion of phase 2 cardiac rehab    Interventions Stress management education;Encouraged to attend Cardiac Rehabilitation for the exercise    Continue Psychosocial Services  No Follow up required    Comments Sister has stage 4 cancer, Helping teenage grandaughter      Initial Review   Source of Stress Concerns Family           Vocational Rehabilitation: Provide vocational rehab assistance to qualifying candidates.   Vocational Rehab Evaluation & Intervention:   Education: Education Goals: Education classes will be provided on a weekly basis, covering required topics. Participant will state understanding/return demonstration of topics presented.  Learning Barriers/Preferences:   Education Topics: Hypertension, Hypertension Reduction -Define heart disease and high blood pressure. Discus how high blood pressure affects the body and ways to reduce high blood pressure.   Exercise and Your Heart -Discuss why it is important to exercise, the FITT principles of exercise, normal and abnormal responses to exercise, and how to exercise safely.   Angina -Discuss definition of angina, causes of angina, treatment of angina, and how to decrease risk of having angina.   Cardiac Medications -Review  what the following cardiac medications are used for, how they affect the body, and side effects that may occur when taking the medications.  Medications include Aspirin, Beta blockers, calcium channel blockers, ACE Inhibitors, angiotensin receptor blockers, diuretics, digoxin, and antihyperlipidemics.   Congestive Heart Failure -Discuss the definition of CHF, how to live with CHF, the signs and  symptoms of CHF, and how keep track of weight and sodium intake.   Heart Disease and Intimacy -Discus the effect sexual activity has on the heart, how changes occur during intimacy as we age, and safety during sexual activity.   Smoking Cessation / COPD -Discuss different methods to quit smoking, the health benefits of quitting smoking, and the definition of COPD.   Nutrition I: Fats -Discuss the types of cholesterol, what cholesterol does to the heart, and how cholesterol levels can be controlled.   Nutrition II: Labels -Discuss the different components of food labels and how to read food label   Heart Parts/Heart Disease and PAD -Discuss the anatomy of the heart, the pathway of blood circulation through the heart, and these are affected by heart disease.   Stress I: Signs and Symptoms -Discuss the causes of stress, how stress may lead to anxiety and depression, and ways to limit stress.   Stress II: Relaxation -Discuss different types of relaxation techniques to limit stress.   Warning Signs of Stroke / TIA -Discuss definition of a stroke, what the signs and symptoms are of a stroke, and how to identify when someone is having stroke.   Knowledge Questionnaire Score:   Core Components/Risk Factors/Patient Goals at Admission:   Core Components/Risk Factors/Patient Goals Review:   Goals and Risk Factor Review    Row Name 05/25/20 1502 06/22/20 1213           Core Components/Risk Factors/Patient Goals Review   Personal Goals Review Weight Management/Obesity;Hypertension;Lipids;Tobacco Cessation;Stress Weight Management/Obesity;Hypertension;Lipids;Tobacco Cessation;Stress      Review Antwain  has been doing well with exercise. Ellijah's vital signs have been stable. Will continue to encourage smoking cessation as Isaack continues to smoke Sherri  has been doing well with exercise. Muad's vital signs have been stable. Will continue to encourage smoking cessation as  Aidenjames continues to smoke. Leshaun has bee absent due to a COPD exacerbation      Expected Outcomes Jahkeem will continue to participate in phase 2 cardiac rehab for exercise, nutrition and lifestyle modificatons. Oval will continue to participate in phase 2 cardiac rehab for exercise, nutrition and lifestyle modificatons upon his return to phase 2 cardiac rehab.             Core Components/Risk Factors/Patient Goals at Discharge (Final Review):   Goals and Risk Factor Review - 06/22/20 1213      Core Components/Risk Factors/Patient Goals Review   Personal Goals Review Weight Management/Obesity;Hypertension;Lipids;Tobacco Cessation;Stress    Review Lon  has been doing well with exercise. Kwamane's vital signs have been stable. Will continue to encourage smoking cessation as Lavalle continues to smoke. Valgene has bee absent due to a COPD exacerbation    Expected Outcomes Rembert will continue to participate in phase 2 cardiac rehab for exercise, nutrition and lifestyle modificatons upon his return to phase 2 cardiac rehab.           ITP Comments:  ITP Comments    Row Name 04/27/20 1826 05/25/20 1459 06/22/20 1205       ITP Comments 30 daqy ITP Review. Bennett is to start cardiac rehab on 04/28/20 30 day  ITP Review Keymani has good attendance and participation in phase 2 cardiac rehab 30 day ITP Review Lenin has good attendance and participation in phase 2 cardiac rehab. Mads has been out with a COPD exacerbation his last exercise session was on 06/16/20.            Comments: See ITP comments.Barnet Pall, RN,BSN 06/22/2020 12:15 PM

## 2020-06-23 ENCOUNTER — Telehealth (HOSPITAL_COMMUNITY): Payer: Self-pay | Admitting: Internal Medicine

## 2020-06-23 ENCOUNTER — Encounter: Payer: Self-pay | Admitting: Internal Medicine

## 2020-06-23 ENCOUNTER — Ambulatory Visit (HOSPITAL_COMMUNITY): Payer: 59

## 2020-06-23 ENCOUNTER — Other Ambulatory Visit (HOSPITAL_COMMUNITY): Payer: Self-pay

## 2020-06-23 ENCOUNTER — Other Ambulatory Visit: Payer: Self-pay

## 2020-06-23 ENCOUNTER — Ambulatory Visit (INDEPENDENT_AMBULATORY_CARE_PROVIDER_SITE_OTHER): Payer: 59 | Admitting: Internal Medicine

## 2020-06-23 VITALS — BP 122/78 | HR 71 | Temp 97.7°F | Ht 70.6 in | Wt 179.2 lb

## 2020-06-23 DIAGNOSIS — E785 Hyperlipidemia, unspecified: Secondary | ICD-10-CM | POA: Diagnosis not present

## 2020-06-23 DIAGNOSIS — E1169 Type 2 diabetes mellitus with other specified complication: Secondary | ICD-10-CM | POA: Insufficient documentation

## 2020-06-23 DIAGNOSIS — B351 Tinea unguium: Secondary | ICD-10-CM | POA: Insufficient documentation

## 2020-06-23 DIAGNOSIS — R3912 Poor urinary stream: Secondary | ICD-10-CM | POA: Diagnosis not present

## 2020-06-23 DIAGNOSIS — Z0000 Encounter for general adult medical examination without abnormal findings: Secondary | ICD-10-CM

## 2020-06-23 DIAGNOSIS — N182 Chronic kidney disease, stage 2 (mild): Secondary | ICD-10-CM

## 2020-06-23 DIAGNOSIS — J449 Chronic obstructive pulmonary disease, unspecified: Secondary | ICD-10-CM

## 2020-06-23 DIAGNOSIS — I129 Hypertensive chronic kidney disease with stage 1 through stage 4 chronic kidney disease, or unspecified chronic kidney disease: Secondary | ICD-10-CM | POA: Diagnosis not present

## 2020-06-23 DIAGNOSIS — R7309 Other abnormal glucose: Secondary | ICD-10-CM | POA: Diagnosis not present

## 2020-06-23 DIAGNOSIS — Z1152 Encounter for screening for COVID-19: Secondary | ICD-10-CM

## 2020-06-23 DIAGNOSIS — R351 Nocturia: Secondary | ICD-10-CM | POA: Diagnosis not present

## 2020-06-23 DIAGNOSIS — N401 Enlarged prostate with lower urinary tract symptoms: Secondary | ICD-10-CM | POA: Insufficient documentation

## 2020-06-23 HISTORY — DX: Type 2 diabetes mellitus with other specified complication: E11.69

## 2020-06-23 LAB — POCT URINALYSIS DIPSTICK
Bilirubin, UA: NEGATIVE
Blood, UA: NEGATIVE
Glucose, UA: NEGATIVE
Ketones, UA: NEGATIVE
Leukocytes, UA: NEGATIVE
Nitrite, UA: NEGATIVE
Protein, UA: NEGATIVE
Spec Grav, UA: 1.02 (ref 1.010–1.025)
Urobilinogen, UA: 0.2 E.U./dL
pH, UA: 6 (ref 5.0–8.0)

## 2020-06-23 MED ORDER — TAMSULOSIN HCL 0.4 MG PO CAPS
0.4000 mg | ORAL_CAPSULE | Freq: Every day | ORAL | 2 refills | Status: DC
  Filled 2020-06-23: qty 90, 90d supply, fill #0
  Filled 2020-09-30: qty 90, 90d supply, fill #1
  Filled 2020-12-31: qty 90, 90d supply, fill #2

## 2020-06-23 NOTE — Patient Instructions (Signed)

## 2020-06-23 NOTE — Progress Notes (Signed)
I,[name],acting as a Education administrator for [name], MD.,have documented all relevant documentation on the behalf of [name], MD,as directed by  [name], MD while in the presence of [name], MD.  This visit occurred during the SARS-CoV-2 public health emergency.  Safety protocols were in place, including screening questions prior to the visit, additional usage of staff PPE, and extensive cleaning of exam room while observing appropriate contact time as indicated for disinfecting solutions.  Subjective:     [name] , male    DOB: 09-28-1950 , 70 y.o.   MRN: [id number]   Chief Complaint  Patient presents with  . Annual Exam  . Hypertension    HPI  He is here today for a full physical examination. He recently completed abx for COPD exacerbation/bronchitis. Prednisone alone did not help with his sx. The patient states he needs a covid test before he can go back to cardiac rehab.  Hypertension This is a chronic problem. The current episode started more than 1 year ago. The problem has been gradually improving since onset. The problem is controlled. Pertinent negatives include no blurred vision, chest pain, palpitations or shortness of breath. Risk factors for coronary artery disease include smoking/tobacco exposure and sedentary lifestyle. Past treatments include angiotensin blockers. The current treatment provides moderate improvement.     Past Medical History:  Diagnosis Date  . Arthritis   . Bullous emphysema (HCC)    Severe Bilateral  . Cocaine abuse ([place])   . Colitis    with bleeding  . COPD (chronic obstructive pulmonary disease) ([place])   . Coronary artery disease   . Dyspnea   . GERD (gastroesophageal reflux disease)   . Headache    sinusitis  . Hypertension   . LBP (low back pain)    now chronic  . Lumbago   . Lung mass    Right Upper Lobe pleural based mass, negative  on PET Scan  . Neck pain    nerve pain   .  Pneumonia    hx  . S/P angioplasty with stent 01/13/20 emergent DES to mLAD  01/15/2020  . Shortness of breath 03/03/2020  . Spine pain    nerve  . Therapeutic drug monitoring   . Tobacco abuse      Family History  Adopted: Yes  Problem Relation Age of Onset  . Cancer Father   . Lung cancer Father   . Heart failure Sister   . Breast cancer Sister   . Emphysema Mother      Current Outpatient Medications:  .  acetaminophen (TYLENOL) 325 MG tablet, Take 2 tablets (650 mg total) by mouth every 4 (four) hours as needed for headache or mild pain., Disp: , Rfl:  .  albuterol (VENTOLIN HFA) 108 (90 Base) MCG/ACT inhaler, INHALE 2 PUFFS INTO THE LUNGS EVERY 6 (SIX) HOURS AS NEEDED FOR WHEEZING OR SHORTNESS OF BREATH., Disp: 8.5 g, Rfl: 2 .  aspirin 81 MG chewable tablet, Chew 1 tablet (81 mg total) by mouth daily., Disp: , Rfl:  .  atorvastatin (LIPITOR) 80 MG tablet, TAKE 1 TABLET (80 MG TOTAL) BY MOUTH DAILY., Disp: 30 tablet, Rfl: 6 .  Blood Pressure Monitoring (OMRON 3 SERIES BP MONITOR) DEVI, USE AS DIRECTED, Disp: 1 each, Rfl: 0 .  clopidogrel (PLAVIX) 75 MG tablet, TAKE 1 TABLET BY MOUTH DAILY. STOP BRILINTA, Disp: 93 tablet, Rfl: 3 .  cyclobenzaprine (FLEXERIL) 10 MG tablet, TAKE 1 TABLET (10  MG TOTAL) BY MOUTH AS NEEDED FOR MUSCLE SPASMS., Disp: 30 tablet, Rfl: 0 .  diclofenac Sodium (VOLTAREN) 1 % GEL, Apply 2 g topically 2 (two) times daily., Disp: , Rfl:  .  famotidine (PEPCID) 40 MG tablet, TAKE 1 TABLET BY MOUTH TWICE DAILY., Disp: 60 tablet, Rfl: 12 .  ipratropium-albuterol (DUONEB) 0.5-2.5 (3) MG/3ML SOLN, Inhale 1 vial via nebulizer 4 times a day, Disp: 270 mL, Rfl: PRN .  metoprolol tartrate (LOPRESSOR) 25 MG tablet, TAKE 1/2 TABLET (12.5 MG TOTAL) BY MOUTH 2 (TWO) TIMES DAILY. (Patient taking differently: Take 12.5 mg by mouth 2 (two) times daily.), Disp: 30 tablet, Rfl: 6 .  pantoprazole (PROTONIX) 40 MG tablet, TAKE 1 TABLET (40 MG TOTAL) BY MOUTH DAILY.STOP OMEPRAZOLE  (Patient taking differently: Take 40 mg by mouth 2 (two) times daily. Patient says he is taking twice a day), Disp: 90 tablet, Rfl: 3 .  pregabalin (LYRICA) 50 MG capsule, TAKE 1 CAPSULE BY MOUTH TWICE DAILY (Patient taking differently: Take by mouth daily.), Disp: 60 capsule, Rfl: 3 .  Respiratory Therapy Supplies (FLUTTER) DEVI, Use as directed, Disp: 1 each, Rfl: 0 .  Carboxymethylcellul-Glycerin (CLEAR EYES FOR DRY EYES OP), Apply 1 drop to eye daily as needed (for dry eyes)., Disp: , Rfl:  .  losartan (COZAAR) 50 MG tablet, TAKE 1 TABLET BY MOUTH DAILY, Disp: 90 tablet, Rfl: 2 .  Magnesium 400 MG CAPS, Take 400 mg by mouth daily. (Patient not taking: Reported on 06/23/2020), Disp: 30 capsule, Rfl: 1 .  metroNIDAZOLE (FLAGYL) 250 MG tablet, TAKE 1 TABLET BY MOUTH THREE TIMES DAILY FOR 30 DAYS., Disp: 90 tablet, Rfl: 0 .  mometasone-formoterol (DULERA) 200-5 MCG/ACT AERO, INHALE 2 PUFFS INTO THE LUNGS 2 (TWO) TIMES DAILY., Disp: 13 g, Rfl: 3 .  montelukast (SINGULAIR) 10 MG tablet, TAKE 1 TABLET BY MOUTH EVERY DAY IN THE EVENING, Disp: 30 tablet, Rfl: 3 .  nicotine (NICODERM CQ - DOSED IN MG/24 HR) 7 mg/24hr patch, PLACE 1 PATCH (7 MG TOTAL) ONTO THE SKIN AT BEDTIME., Disp: 28 patch, Rfl: 0 .  nitroGLYCERIN (NITROSTAT) 0.4 MG SL tablet, PLACE 1 TABLET (0.4 MG TOTAL) UNDER THE TONGUE EVERY 5 (FIVE) MINUTES AS NEEDED FOR CHEST PAIN., Disp: 25 tablet, Rfl: 4 .  predniSONE (DELTASONE) 10 MG tablet, TAKE 6 TABS BY MOUTH ON DAY 1; 5 TABS ON DAY 2; 4 TABS ON DAY 3; 3 TABS ON DAY 4; 2 TABS ON DAY 5; 1 TAB ON DAY 6 THEN STOP (Patient not taking: Reported on 06/23/2020), Disp: 21 tablet, Rfl: 0 .  tamsulosin (FLOMAX) 0.4 MG CAPS capsule, Take 1 capsule (0.4 mg total) by mouth daily., Disp: 90 capsule, Rfl: 2   Allergies  Allergen Reactions  . Penicillins Anaphylaxis and Hives    Has patient had a PCN reaction causing immediate rash, facial/tongue/throat swelling, SOB or lightheadedness with hypotension:  No Has patient had a PCN reaction causing severe rash involving mucus membranes or skin necrosis: No Has patient had a PCN reaction that required hospitalization No Has patient had a PCN reaction occurring within the last 10 years: No If all of the above answers are "NO", then may proceed with Cephalosporin use.       Men's preventive visit. Patient Health Questionnaire (PHQ-2) is  Flowsheet Row CARDIAC REHAB PHASE II ORIENTATION from 04/22/2020 in [facility]  PHQ-2 Total Score 0    . Patient is on a heart healthy diet. Marital status: Married. Relevant   history for alcohol use is:  Social History   Substance and Sexual Activity  Alcohol Use Yes   Comment: scotch once a week  . Relevant history for tobacco use is:  Social History   Tobacco Use  Smoking Status Current Every Day Smoker  . Packs/day: 0.50  . Years: 45.00  . Pack years: 22.50  . Types: Cigarettes  Smokeless Tobacco Never Used  Tobacco Comment   04/22/20 Patient given 1-800-quit-now  and the link to [facility] health virtual smoking cessation link  .   Review of Systems  Constitutional: Negative.   HENT: Negative.   Eyes: Negative.  Negative for blurred vision.  Respiratory: Negative.  Negative for shortness of breath.   Cardiovascular: Negative.  Negative for chest pain and palpitations.  Gastrointestinal: Negative.   Endocrine: Negative.   Genitourinary: Negative.   Musculoskeletal: Negative.   Skin: Negative.   Allergic/Immunologic: Negative.   Neurological: Negative.   Hematological: Negative.   Psychiatric/Behavioral: Negative.      Today's Vitals   06/23/20 1133  BP: 122/78  Pulse: 71  Temp: 97.7 F (36.5 C)  SpO2: 95%  Weight: 179 lb 3.2 oz (81.3 kg)  Height: 5' 10.6" (1.793 m)  PainSc: 0-No pain   Body mass index is 25.28 kg/m.  Wt Readings from Last 3 Encounters:  06/23/20 179 lb 3.2 oz (81.3 kg)  06/10/20 181 lb (82.1 kg)  04/26/20 172 lb (78 kg)    Objective:  Physical Exam Vitals and nursing note reviewed.  Constitutional:      Appearance: Normal appearance.  HENT:     Head: Normocephalic and atraumatic.     Right Ear: Tympanic membrane, ear canal and external ear normal.     Left Ear: Tympanic membrane, ear canal and external ear normal.     Nose:     Comments: Masked     Mouth/Throat:     Comments: Masked  Eyes:     Extraocular Movements: Extraocular movements intact.     Conjunctiva/sclera: Conjunctivae normal.     Pupils: Pupils are equal, round, and reactive to light.  Cardiovascular:     Rate and Rhythm: Normal rate and regular rhythm.     Pulses: Normal pulses.     Heart sounds: Normal heart sounds.  Pulmonary:     Effort: Pulmonary effort is normal.     Breath sounds: Normal air entry. No decreased air movement. Examination of the right-middle field reveals rhonchi. Examination of the left-middle field reveals rhonchi. Rhonchi present.  Chest:  Breasts:     Right: Normal. No swelling, bleeding, inverted nipple, mass or nipple discharge.     Left: Normal. No swelling, bleeding, inverted nipple, mass or nipple discharge.    Abdominal:     General: Abdomen is flat. Bowel sounds are normal.     Palpations: Abdomen is soft.  Genitourinary:    Comments: Deferred  Musculoskeletal:        General: Normal range of motion.     Cervical back: Normal range of motion and neck supple.  Skin:    General: Skin is warm.  Neurological:     General: No focal deficit present.     Mental Status: He is alert.  Psychiatric:        Mood and Affect: Mood normal.        Behavior: Behavior normal.         Assessment And Plan:    1. Routine general medical examination at health care facility Comments: A full  exam was performed. DRE deferred, he is followed by Urology. PATIENT IS ADVISED TO GET 30-45 MINUTES REGULAR EXERCISE NO LESS THAN FOUR TO FIVE DAYS PER WEEK - BOTH WEIGHTBEARING EXERCISES AND AEROBIC ARE RECOMMENDED.   PATIENT IS ADVISED TO FOLLOW A HEALTHY DIET WITH AT LEAST SIX FRUITS/VEGGIES PER DAY, DECREASE INTAKE OF RED MEAT, AND TO INCREASE FISH INTAKE TO TWO DAYS PER WEEK.  MEATS/FISH SHOULD NOT BE FRIED, BAKED OR BROILED IS PREFERABLE.  IT IS ALSO IMPORTANT TO CUT BACK ON YOUR SUGAR INTAKE. PLEASE AVOID ANYTHING WITH ADDED SUGAR, CORN SYRUP OR OTHER SWEETENERS. IF YOU MUST USE A SWEETENER, YOU CAN TRY STEVIA. IT IS ALSO IMPORTANT TO AVOID ARTIFICIALLY SWEETENERS AND DIET BEVERAGES. LASTLY, I SUGGEST WEARING SPF 50 SUNSCREEN ON EXPOSED PARTS AND ESPECIALLY WHEN IN THE DIRECT SUNLIGHT FOR AN EXTENDED PERIOD OF TIME.  PLEASE AVOID FAST FOOD RESTAURANTS AND INCREASE YOUR WATER INTAKE.  - CBC - CMP14+EGFR - PSA - Lipid panel  2. Parenchymal renal hypertension, stage 1 through stage 4 or unspecified chronic kidney disease Comments: Chronic, well controlled. He is encouraged to follow low sodium diet. EKG not done. Most recent cardiology notes/EKG reviewed. HE agrees to rto in six months for re-evaluation. I will also check renal function today.  - POCT Urinalysis Dipstick (81002)  3. Chronic renal disease, stage II Comments: He is enocuraged to maintain great BP control and stay well hydrated to delay progression of CKD.   4. Other abnormal glucose Comments: His a1c has been elevated in the past. I will recheck this today. He is encouraged to limit his intake of sweetened beverages, including diet drinks.  - Hemoglobin A1c  5. COPD with chronic bronchitis ([place]) Comments: He is getting over recent exacerbation. Encouraged to avoid triggers. Importance of tobacco cessation was d/w patient.   6. Benign prostatic hyperplasia with weak urinary stream Comments: DRE deferred, he is followed by Urology. I will check PSA.  - tamsulosin (FLOMAX) 0.4 MG CAPS capsule; Take 1 capsule (0.4 mg total) by mouth daily.  Dispense: 90 capsule; Refill: 2  7. Onychomycosis Comments: I will check baseline LFTs. If normal, I  will send rx terbinafine 279m qd x 6 weeks. Encouraged to avoid ETOH while on this medication.   8. Encounter for screening for COVID-19 Comments: He reports negative COVID test is needed prior to returning to cardiac rehab.  - Novel Coronavirus, NAA ([facility])  Patient was given opportunity to ask questions. Patient verbalized understanding of the plan and was able to repeat key elements of the plan. All questions were answered to their satisfaction.   I, RMaximino Greenland MD, have reviewed all documentation for this visit. The documentation on 06/23/20 for the exam, diagnosis, procedures, and orders are all accurate and complete.  THE PATIENT IS ENCOURAGED TO PRACTICE SOCIAL DISTANCING DUE TO THE COVID-19 PANDEMIC.

## 2020-06-24 LAB — CBC
Hematocrit: 49.8 % (ref 37.5–51.0)
Hemoglobin: 16.7 g/dL (ref 13.0–17.7)
MCH: 29 pg (ref 26.6–33.0)
MCHC: 33.5 g/dL (ref 31.5–35.7)
MCV: 87 fL (ref 79–97)
Platelets: 255 10*3/uL (ref 150–450)
RBC: 5.76 x10E6/uL (ref 4.14–5.80)
RDW: 14.2 % (ref 11.6–15.4)
WBC: 8.5 10*3/uL (ref 3.4–10.8)

## 2020-06-24 LAB — LIPID PANEL
Chol/HDL Ratio: 2.7 ratio (ref 0.0–5.0)
Cholesterol, Total: 151 mg/dL (ref 100–199)
HDL: 55 mg/dL (ref >39)
LDL Chol Calc (NIH): 67 mg/dL (ref 0–99)
Triglycerides: 175 mg/dL — ABNORMAL HIGH (ref 0–149)
VLDL Cholesterol Cal: 29 mg/dL (ref 5–40)

## 2020-06-24 LAB — HEMOGLOBIN A1C
Est. average glucose Bld gHb Est-mCnc: 137 mg/dL
Hgb A1c MFr Bld: 6.4 % — ABNORMAL HIGH (ref 4.8–5.6)

## 2020-06-24 LAB — PSA: Prostate Specific Ag, Serum: 0.6 ng/mL (ref 0.0–4.0)

## 2020-06-25 ENCOUNTER — Ambulatory Visit (HOSPITAL_COMMUNITY): Payer: 59

## 2020-06-25 DIAGNOSIS — Z1152 Encounter for screening for COVID-19: Secondary | ICD-10-CM | POA: Diagnosis not present

## 2020-06-25 DIAGNOSIS — Z0000 Encounter for general adult medical examination without abnormal findings: Secondary | ICD-10-CM | POA: Diagnosis not present

## 2020-06-27 ENCOUNTER — Encounter: Payer: Self-pay | Admitting: Internal Medicine

## 2020-06-28 ENCOUNTER — Ambulatory Visit (HOSPITAL_COMMUNITY): Payer: 59

## 2020-06-30 ENCOUNTER — Telehealth (HOSPITAL_COMMUNITY): Payer: Self-pay | Admitting: Internal Medicine

## 2020-06-30 ENCOUNTER — Ambulatory Visit (HOSPITAL_COMMUNITY): Payer: 59

## 2020-07-01 LAB — SPECIMEN STATUS REPORT

## 2020-07-02 ENCOUNTER — Ambulatory Visit (HOSPITAL_COMMUNITY): Payer: 59

## 2020-07-03 LAB — NOVEL CORONAVIRUS, NAA: SARS-CoV-2, NAA: NOT DETECTED

## 2020-07-05 ENCOUNTER — Other Ambulatory Visit (HOSPITAL_COMMUNITY): Payer: Self-pay

## 2020-07-05 ENCOUNTER — Other Ambulatory Visit: Payer: Self-pay

## 2020-07-05 ENCOUNTER — Encounter (HOSPITAL_COMMUNITY)
Admission: RE | Admit: 2020-07-05 | Discharge: 2020-07-05 | Disposition: A | Discharge status: Home / Self Care | Payer: 59 | Source: Ambulatory Visit | Attending: Cardiovascular Disease | Admitting: Cardiovascular Disease

## 2020-07-05 DIAGNOSIS — Z95820 Peripheral vascular angioplasty status with implants and grafts: Secondary | ICD-10-CM | POA: Diagnosis not present

## 2020-07-05 DIAGNOSIS — Z955 Presence of coronary angioplasty implant and graft: Secondary | ICD-10-CM | POA: Diagnosis not present

## 2020-07-05 DIAGNOSIS — I2102 ST elevation (STEMI) myocardial infarction involving left anterior descending coronary artery: Secondary | ICD-10-CM | POA: Diagnosis not present

## 2020-07-05 MED FILL — Losartan Potassium Tab 50 MG: ORAL | 90 days supply | Qty: 90 | Fill #0 | Status: AC

## 2020-07-06 ENCOUNTER — Other Ambulatory Visit: Payer: Self-pay | Admitting: Cardiology

## 2020-07-06 ENCOUNTER — Other Ambulatory Visit (HOSPITAL_COMMUNITY): Payer: Self-pay

## 2020-07-06 MED FILL — Mometasone Furoate-Formoterol Fumarate Aerosol 200-5 MCG/ACT: RESPIRATORY_TRACT | 30 days supply | Qty: 13 | Fill #0 | Status: CN

## 2020-07-07 ENCOUNTER — Encounter (HOSPITAL_COMMUNITY)
Admission: RE | Admit: 2020-07-07 | Discharge: 2020-07-07 | Disposition: A | Discharge status: Home / Self Care | Payer: 59 | Source: Ambulatory Visit | Attending: Cardiovascular Disease | Admitting: Cardiovascular Disease

## 2020-07-07 ENCOUNTER — Other Ambulatory Visit: Payer: Self-pay

## 2020-07-07 DIAGNOSIS — Z955 Presence of coronary angioplasty implant and graft: Secondary | ICD-10-CM | POA: Diagnosis not present

## 2020-07-07 DIAGNOSIS — Z95820 Peripheral vascular angioplasty status with implants and grafts: Secondary | ICD-10-CM

## 2020-07-07 DIAGNOSIS — I2102 ST elevation (STEMI) myocardial infarction involving left anterior descending coronary artery: Secondary | ICD-10-CM | POA: Diagnosis not present

## 2020-07-08 ENCOUNTER — Other Ambulatory Visit (HOSPITAL_COMMUNITY): Payer: Self-pay

## 2020-07-09 ENCOUNTER — Other Ambulatory Visit (HOSPITAL_COMMUNITY): Payer: Self-pay

## 2020-07-09 ENCOUNTER — Encounter (HOSPITAL_COMMUNITY): Payer: 59

## 2020-07-09 ENCOUNTER — Other Ambulatory Visit: Payer: Self-pay | Admitting: Internal Medicine

## 2020-07-09 MED FILL — Albuterol Sulfate Inhal Aero 108 MCG/ACT (90MCG Base Equiv): RESPIRATORY_TRACT | 25 days supply | Qty: 8.5 | Fill #0 | Status: AC

## 2020-07-10 ENCOUNTER — Other Ambulatory Visit (HOSPITAL_COMMUNITY): Payer: Self-pay

## 2020-07-13 ENCOUNTER — Other Ambulatory Visit: Payer: Self-pay | Admitting: Internal Medicine

## 2020-07-13 ENCOUNTER — Other Ambulatory Visit (HOSPITAL_COMMUNITY): Payer: Self-pay

## 2020-07-13 MED ORDER — FLUTICASONE-SALMETEROL 250-50 MCG/ACT IN AEPB
1.0000 | INHALATION_SPRAY | Freq: Two times a day (BID) | RESPIRATORY_TRACT | 2 refills | Status: DC
  Filled 2020-07-13: qty 60, 30d supply, fill #0

## 2020-07-14 ENCOUNTER — Other Ambulatory Visit (HOSPITAL_COMMUNITY): Payer: Self-pay

## 2020-07-15 ENCOUNTER — Other Ambulatory Visit (HOSPITAL_COMMUNITY): Payer: Self-pay

## 2020-07-15 ENCOUNTER — Telehealth: Payer: Self-pay

## 2020-07-15 ENCOUNTER — Other Ambulatory Visit: Payer: Self-pay

## 2020-07-15 MED ORDER — BREZTRI AEROSPHERE 160-9-4.8 MCG/ACT IN AERO
2.0000 | INHALATION_SPRAY | Freq: Two times a day (BID) | RESPIRATORY_TRACT | 2 refills | Status: DC
  Filled 2020-07-15: qty 10.7, 30d supply, fill #0
  Filled 2020-09-17: qty 10.7, 30d supply, fill #1
  Filled 2020-11-17: qty 10.7, 30d supply, fill #2

## 2020-07-15 NOTE — Telephone Encounter (Signed)
breztri rx faxed to the pt's pharmacy at his request

## 2020-07-16 ENCOUNTER — Encounter (HOSPITAL_COMMUNITY)
Admission: RE | Admit: 2020-07-16 | Discharge: 2020-07-16 | Disposition: A | Discharge status: Home / Self Care | Payer: 59 | Source: Ambulatory Visit | Attending: Cardiovascular Disease | Admitting: Cardiovascular Disease

## 2020-07-16 ENCOUNTER — Other Ambulatory Visit: Payer: Self-pay

## 2020-07-16 DIAGNOSIS — Z955 Presence of coronary angioplasty implant and graft: Secondary | ICD-10-CM

## 2020-07-16 DIAGNOSIS — I2102 ST elevation (STEMI) myocardial infarction involving left anterior descending coronary artery: Secondary | ICD-10-CM

## 2020-07-16 DIAGNOSIS — Z95820 Peripheral vascular angioplasty status with implants and grafts: Secondary | ICD-10-CM

## 2020-07-16 NOTE — Progress Notes (Addendum)
Discharge Progress Report  Patient Details  Name: Peter Sims MRN: [id number] Date of Birth: [birth year] Referring Provider:   Flowsheet Row CARDIAC REHAB PHASE II ORIENTATION from 04/22/2020 in [place]  Referring Provider Skeet Latch MD        Number of Visits: 12  Reason for Discharge:  Patient reached a stable level of exercise. Patient independent in their exercise. Patient has met program and personal goals.  Smoking History:  Social History   Tobacco Use  Smoking Status Every Day   Packs/day: 0.50   Years: 45.00   Pack years: 22.50   Types: Cigarettes  Smokeless Tobacco Never  Tobacco Comments   04/22/20 Patient given 1-800-quit-now  and the link to [facility] health virtual smoking cessation link    Diagnosis:  STEMI 01/14/20   S/P DES LAD 01/14/20  S/P angioplasty with stent 01/13/20 emergent DES to mLAD   ADL UCSD:   Initial Exercise Prescription:  Initial Exercise Prescription - 04/22/20 1200       Date of Initial Exercise RX and Referring Provider   Date 04/22/20    Referring Provider Skeet Latch MD    Expected Discharge Date 06/18/20      NuStep   Level 3    SPM 80    Minutes 15    METs 1.8      Arm Ergometer   Level 2    Watts 30    RPM 60    Minutes 15    METs 2.5      Prescription Details   Frequency (times per week) 3    Duration Progress to 30 minutes of continuous aerobic without signs/symptoms of physical distress      Intensity   THRR 40-80% of Max Heartrate 61-122    Ratings of Perceived Exertion 11-13      Progression   Progression Continue progressive overload as per policy without signs/symptoms or physical distress.      Resistance Training   Training Prescription Yes    Weight 5    Reps 10-15             Discharge Exercise Prescription (Final Exercise Prescription Changes):  Exercise Prescription Changes - 07/07/20 1320       Response to Exercise   Blood  Pressure (Admit) 140/84    Blood Pressure (Exercise) 128/80    Blood Pressure (Exit) 122/78    Heart Rate (Admit) 89 bpm    Heart Rate (Exercise) 104 bpm    Heart Rate (Exit) 85 bpm    Rating of Perceived Exertion (Exercise) 11    Symptoms None    Comments Reviewed METs/Pt's last day in the CRP2 program    Duration Continue with 30 min of aerobic exercise without signs/symptoms of physical distress.    Intensity THRR unchanged      Progression   Progression Continue to progress workloads to maintain intensity without signs/symptoms of physical distress.    Average METs 4.5      Resistance Training   Training Prescription No      Interval Training   Interval Training No      Recumbant Bike   Level 3.5    Minutes 15    METs 4.5      Arm Ergometer   Level 4    Watts 18    Minutes 15      Home Exercise Plan   Plans to continue exercise at Home (comment)    Frequency Add 3  additional days to program exercise sessions.    Initial Home Exercises Provided 05/21/20             Functional Capacity:  6 Minute Walk     Row Name 04/22/20 1208         6 Minute Walk   Phase Initial     Distance 1215 feet     Walk Time 6 minutes     # of Rest Breaks 0     MPH 2.3     METS 2.93     RPE 11     Perceived Dyspnea  1     VO2 Peak 10.26     Symptoms No     Resting HR 68 bpm     Resting BP 104/68     Resting Oxygen Saturation  97 %     Exercise Oxygen Saturation  during 6 min walk 99 %     Max Ex. HR 99 bpm     Max Ex. BP 112/60     2 Minute Post BP 102/62              Psychological, QOL, Others - Outcomes: PHQ 2/9: Depression screen [facility] 2/9 07/16/2020 04/22/2020 09/23/2019 10/29/2018 06/05/2018  Decreased Interest 0 0 2 0 0  Down, Depressed, Hopeless 0 0 2 0 0  PHQ - 2 Score 0 0 4 0 0  Altered sleeping - - 2 - -  Tired, decreased energy - - 2 - -  Change in appetite - - 1 - -  Feeling bad or failure about yourself  - - 0 - -  Trouble concentrating - - 0 - -   Moving slowly or fidgety/restless - - 0 - -  Suicidal thoughts - - 0 - -  PHQ-9 Score - - 9 - -  Difficult doing work/chores - - Somewhat difficult - -    Quality of Life:  Quality of Life - 07/16/20 1054       Quality of Life   Select Quality of Life      Quality of Life Scores   Health/Function Post 18.63 %    Socioeconomic Post 21.36 %    Psych/Spiritual Post 24.86 %    Family Post 25.2 %    GLOBAL Post 21.44 %             Personal Goals: Goals established at orientation with interventions provided to work toward goal.  Personal Goals and Risk Factors at Admission - 04/22/20 1314       Core Components/Risk Factors/Patient Goals on Admission    Weight Management Weight Maintenance;Yes    Expected Outcomes Weight Maintenance: Understanding of the daily nutrition guidelines, which includes 25-35% calories from fat, 7% or less cal from saturated fats, less than 259m cholesterol, less than 1.5gm of sodium, & 5 or more servings of fruits and vegetables daily    Tobacco Cessation Yes    Number of packs per day 0.25 (about 5 Cigs a day)    Intervention Assist the participant in steps to quit. Provide individualized education and counseling about committing to Tobacco Cessation, relapse prevention, and pharmacological support that can be provided by physician.;OAdvice worker assist with locating and accessing local/national Quit Smoking programs, and support quit date choice.    Expected Outcomes Short Term: Will demonstrate readiness to quit, by selecting a quit date.;Short Term: Will quit all tobacco product use, adhering to prevention of relapse plan.;Long Term: Complete abstinence from all tobacco products for  at least 12 months from quit date.    Hypertension Yes    Intervention Provide education on lifestyle modifcations including regular physical activity/exercise, weight management, moderate sodium restriction and increased consumption of fresh fruit,  vegetables, and low fat dairy, alcohol moderation, and smoking cessation.    Expected Outcomes Short Term: Continued assessment and intervention until BP is < 140/42mm HG in hypertensive participants. < 130/47mm HG in hypertensive participants with diabetes, heart failure or chronic kidney disease.;Long Term: Maintenance of blood pressure at goal levels.    Lipids Yes    Intervention Provide education and support for participant on nutrition & aerobic/resistive exercise along with prescribed medications to achieve LDL '70mg'$ , HDL >$Remo'40mg'CYehd$ .    Expected Outcomes Short Term: Participant states understanding of desired cholesterol values and is compliant with medications prescribed. Participant is following exercise prescription and nutrition guidelines.;Long Term: Cholesterol controlled with medications as prescribed, with individualized exercise RX and with personalized nutrition plan. Value goals: LDL < $Rem'70mg'kzvT$ , HDL > 40 mg.    Stress Yes    Intervention Offer individual and/or small group education and counseling on adjustment to heart disease, stress management and health-related lifestyle change. Teach and support self-help strategies.;Refer participants experiencing significant psychosocial distress to appropriate mental health specialists for further evaluation and treatment. When possible, include family members and significant others in education/counseling sessions.    Expected Outcomes Short Term: Participant demonstrates changes in health-related behavior, relaxation and other stress management skills, ability to obtain effective social support, and compliance with psychotropic medications if prescribed.;Long Term: Emotional wellbeing is indicated by absence of clinically significant psychosocial distress or social isolation.              Personal Goals Discharge:  Goals and Risk Factor Review     Row Name 05/25/20 1502 06/22/20 1213 08/05/20 0805         Core Components/Risk Factors/Patient  Goals Review   Personal Goals Review Weight Management/Obesity;Hypertension;Lipids;Tobacco Cessation;Stress Weight Management/Obesity;Hypertension;Lipids;Tobacco Cessation;Stress Weight Management/Obesity;Hypertension;Lipids;Tobacco Cessation;Stress     Review Peter Sims  has been doing well with exercise. Peter Sims vital signs have been stable. Will continue to encourage smoking cessation as Peter Sims Peter Sims  has been doing well with exercise. Peter Sims vital signs have been stable. Will continue to encourage smoking cessation as Peter Sims. Peter Sims has bee absent due to a COPD exacerbation Trinten's. Peter Sims last day of exercise was on 07/07/20. Peter Sims did not return to complete his post exercise walk test. Patient says he is working out at the gym     Expected Outcomes Peter Sims will continue to participate in phase 2 cardiac rehab for exercise, nutrition and lifestyle modificatons. Peter Sims will continue to participate in phase 2 cardiac rehab for exercise, nutrition and lifestyle modificatons upon his return to phase 2 cardiac rehab. Peter Sims will continue to exercise, Follow nutrition and lifestyle modifications. Peter Sims continues to Sims.              Exercise Goals and Review:  Exercise Goals     Row Name 04/22/20 1252             Exercise Goals   Increase Physical Activity Yes       Intervention Provide advice, education, support and counseling about physical activity/exercise needs.;Develop an individualized exercise prescription for aerobic and resistive training based on initial evaluation findings, risk stratification, comorbidities and participant's personal goals.       Expected Outcomes Short Term: Attend rehab on a regular basis to increase amount of physical  activity.;Long Term: Add in home exercise to make exercise part of routine and to increase amount of physical activity.;Long Term: Exercising regularly at least 3-5 days a week.        Increase Strength and Stamina Yes       Intervention Provide advice, education, support and counseling about physical activity/exercise needs.;Develop an individualized exercise prescription for aerobic and resistive training based on initial evaluation findings, risk stratification, comorbidities and participant's personal goals.       Expected Outcomes Short Term: Increase workloads from initial exercise prescription for resistance, speed, and METs.;Short Term: Perform resistance training exercises routinely during rehab and add in resistance training at home;Long Term: Improve cardiorespiratory fitness, muscular endurance and strength as measured by increased METs and functional capacity (6MWT)       Able to understand and use rate of perceived exertion (RPE) scale Yes       Intervention Provide education and explanation on how to use RPE scale       Expected Outcomes Short Term: Able to use RPE daily in rehab to express subjective intensity level;Long Term:  Able to use RPE to guide intensity level when exercising independently       Knowledge and understanding of Target Heart Rate Range (THRR) Yes       Intervention Provide education and explanation of THRR including how the numbers were predicted and where they are located for reference       Expected Outcomes Short Term: Able to state/look up THRR;Short Term: Able to use daily as guideline for intensity in rehab;Long Term: Able to use THRR to govern intensity when exercising independently       Understanding of Exercise Prescription Yes       Intervention Provide education, explanation, and written materials on patient's individual exercise prescription       Expected Outcomes Short Term: Able to explain program exercise prescription;Long Term: Able to explain home exercise prescription to exercise independently                Exercise Goals Re-Evaluation:  Exercise Goals Re-Evaluation     Row Name 05/12/20 0957 05/12/20 0959 05/21/20  1017 06/14/20 1115       Exercise Goal Re-Evaluation   Exercise Goals Review Increase Physical Activity;Increase Strength and Stamina;Able to understand and use rate of perceived exertion (RPE) scale;Knowledge and understanding of Target Heart Rate Range (THRR);Understanding of Exercise Prescription -- Increase Physical Activity;Increase Strength and Stamina;Able to understand and use rate of perceived exertion (RPE) scale;Knowledge and understanding of Target Heart Rate Range (THRR);Able to check pulse independently;Understanding of Exercise Prescription Increase Physical Activity;Increase Strength and Stamina;Able to understand and use rate of perceived exertion (RPE) scale;Knowledge and understanding of Target Heart Rate Range (THRR);Able to check pulse independently;Understanding of Exercise Prescription    Comments -- Pt's first day in the CRP2 program. Pt understands the exercise Rx, THRR and RPE scale. Reviewed METs and home exercise Rx. Pt is progressing well. Pt is also walking at home daily for 30 minutes. Reviewed METs and Goals. Pt is progessing and working toward goal. Pt feels stonger and more energetic. Walking at home.    Expected Outcomes -- Will continue to monitor patient and progress exercise workloads as tolerated. Pt will continue to walk at home 5-7x/week for 30-45 minutes. Will continue to monitor and progress exercise workloads as tolerated.             Nutrition & Weight - Outcomes:  Pre Biometrics - 04/22/20 1259  Pre Biometrics   Height _0  (1.803 m)    Weight 84.5 kg    Waist Circumference 40.5 inches    Hip Circumference 42 inches    Waist to Hip Ratio 0.96 %    BMI (Calculated) 25.99    Triceps Skinfold 20 mm    % Body Fat 28.1 %    Grip Strength 57 kg    Flexibility 13.5 in    Single Leg Stand 15 seconds              Nutrition:  Nutrition Therapy & Goals - 05/26/20 1446       Nutrition Therapy   Diet TLC    Drug/Food Interactions  Statins/Certain Fruits      Personal Nutrition Goals   Nutrition Goal Pt to continue to limit refined carbohydrate intake    Personal Goal #2 Pt to build a healthy plate including vegetables, fruits, whole grains, and low-fat dairy products in a heart healthy meal plan.      Intervention Plan   Intervention Prescribe, educate and counsel regarding individualized specific dietary modifications aiming towards targeted core components such as weight, hypertension, lipid management, diabetes, heart failure and other comorbidities.;Nutrition handout(s) given to patient.    Expected Outcomes Short Term Goal: Understand basic principles of dietary content, such as calories, fat, sodium, cholesterol and nutrients.;Long Term Goal: Adherence to prescribed nutrition plan.             Nutrition Discharge:   Education Questionnaire Score:  Knowledge Questionnaire Score - 07/21/20 1055       Knowledge Questionnaire Score   Post Score 24/28             Kaileb came by to report that he is finished. Patient turned in his access badge and says that he is currently going to the gym. "cardiac rehab is not challenging for me. Leonides attended 12 exercise sessions between 05/12/20-07/07/20. Kaidence's attendance was fair. Daxten was out due to a COPD exacerbation and other reasons non specified. Encouraged smoking cessation  As Keanu continues to Sims 5 cigarettes a day. Patient did not complete his post exercise 6 minute walk test. Barnet Pall, RN,BSN 08/05/2020 8:11 AM

## 2020-07-19 ENCOUNTER — Other Ambulatory Visit (HOSPITAL_COMMUNITY): Payer: Self-pay

## 2020-07-19 MED FILL — Nitroglycerin SL Tab 0.4 MG: SUBLINGUAL | 25 days supply | Qty: 25 | Fill #0 | Status: AC

## 2020-07-19 MED FILL — Pantoprazole Sodium EC Tab 40 MG (Base Equiv): ORAL | 90 days supply | Qty: 90 | Fill #0 | Status: AC

## 2020-08-08 NOTE — Progress Notes (Signed)
Cardiology Clinic Note   Patient Name: Peter Sims Date of Encounter: 08/10/2020  Primary Care Provider:  Glendale Chard, MD Primary Cardiologist:  Skeet Latch, MD  Patient Profile    Peter Sims 70 year old male presents to the clinic today for follow-up evaluation of his chest pain, hyperlipidemia, and coronary artery disease.  Past Medical History    Past Medical History:  Diagnosis Date   Arthritis    Bullous emphysema (HCC)    Severe Bilateral   Cocaine abuse ([place])    Colitis    with bleeding   COPD (chronic obstructive pulmonary disease) (HCC)    Coronary artery disease    Dyspnea    GERD (gastroesophageal reflux disease)    Headache    sinusitis   Hypertension    LBP (low back pain)    now chronic   Lumbago    Lung mass    Right Upper Lobe pleural based mass, negative  on PET Scan   Neck pain    nerve pain    Pneumonia    hx   S/P angioplasty with stent 01/13/20 emergent DES to mLAD  01/15/2020   Shortness of breath 03/03/2020   Spine pain    nerve   Therapeutic drug monitoring    Tobacco abuse    Past Surgical History:  Procedure Laterality Date   CARDIAC CATHETERIZATION     COLONOSCOPY     CORONARY/GRAFT ACUTE MI REVASCULARIZATION N/A 01/14/2020   Procedure: Coronary/Graft Acute MI Revascularization;  Surgeon: Sherren Mocha, MD;  Location: [facility];  Service: Cardiovascular;  Laterality: N/A;   LEFT HEART CATH AND CORONARY ANGIOGRAPHY N/A 01/14/2020   Procedure: LEFT HEART CATH AND CORONARY ANGIOGRAPHY;  Surgeon: Sherren Mocha, MD;  Location: [facility];  Service: Cardiovascular;  Laterality: N/A;   LUMBAR LAMINECTOMY/DECOMPRESSION MICRODISCECTOMY Right 05/03/2016   Procedure: MICRODISCECTOMY LUMBAR FIVE- SACRAL ONE RIGHT;  Surgeon: Ashok Pall, MD;  Location: [place];  Service: Neurosurgery;  Laterality: Right;   NO PAST SURGERIES      Allergies  Allergies  Allergen Reactions   Penicillins Anaphylaxis and  Hives    Has patient had a PCN reaction causing immediate rash, facial/tongue/throat swelling, SOB or lightheadedness with hypotension: No Has patient had a PCN reaction causing severe rash involving mucus membranes or skin necrosis: No Has patient had a PCN reaction that required hospitalization No Has patient had a PCN reaction occurring within the last 10 years: No If all of the above answers are "NO", then may proceed with Cephalosporin use.      History of Present Illness    Peter Sims has a PMH of coronary artery disease status post anterior STEMI, HTN, HLD, tobacco abuse,bullous emphysema, COPD, and GERD.  He was seen in the emergency department 10/21 with precordial pain.  He also reported pain in his back and neck at that time.  He had an episode of lightheadedness and became diaphoretic after having a bowel movement.  He reported lethargy and slurred speech as well.  He contacted EMS who completed orthostatic vitals which showed hypotension.  His blood pressure improved and they decided to not transport him to the hospital.  He contacted his PCP the following day and they recommended presenting to the emergency department for evaluation.  In the emergency department his troponins were 8-22-7.  His EKG showed sinus tachycardia 112 bpm.  No ischemic changes were noted.  His chest CTA showed pulmonary nodules, foci of atherosclerosis and the  coronaries and aorta.  He was discharged in stable condition.  He reported that he had a similar episode previously with standing.  He was outside talking to a neighbor.  He had a sharp pain in his chest.  He started walking into his kitchen and remembered profuse sweating.  He was unable to speak at that time.  He knew what he wanted to say but could not speak.  He reports he nearly fainted.  His pain was described as a spasm.  He indicated that he had also previously experienced pain with turning too quickly.  During his initial appointment he was  referred for coronary CTA.  However, before the procedure could be completed he presented back to the hospital with chest discomfort.  His initial EKG showed STEMI.  His high-sensitivity troponins were elevated in the 80s.  They increased to 106.  He had an acute episode of chest discomfort overnight and was taken to the cardiac Cath Lab.  He was noted to have developed anterior ST elevation MI and had PCI to his mid LAD.  He was noted to have another mild-moderate lesions that were medically managed.  At that time he was started on metoprolol and ticagrelor.  He was seen in follow-up by Dr. Oval Linsey on 04/26/2020.  During that time he continued to have left-sided chest pain.  It was constant with sporadic flares.  He reported it felt like nagging pain.  He presented to the emergency department when he had more intense pain for about an hour.  He went to the emergency department 03/03/2020.  His EKG showed sinus arrhythmia.  His cardiac enzymes were negative.  He also reported feeling short of breath.  He noted feeling fatigued with minimal exertion.  He had started cardiac rehab and was enjoying program.  He reported that the pain in his left chest felt like muscle pain.  He reported it felt different than the heart attack pain he felt previously.  His blood pressure at that time was well controlled.  He also mentions pain related to diverticulitis.  He had lost weight.  He was using nicotine patches and started nicotine gum.  He had cut back to 5 cigarettes/day.  He presents the clinic today for follow-up evaluation states he feels well.  He did notice 1 episode of muscle spasm type pain in his left chest 2 days ago.  He reports that he stays very physically active walking 30-40 minutes with his dogs every day.  He denies chest discomfort with increased physical activity.  He has not noticed any changes in his breathing and continues to smoke about 5 cigarettes/day.  We discussed the importance of cholesterol  management and reviewed his most recent cholesterol panel.  I will give him the salty 6 diet sheet, have him maintain his physical activity, and have him follow-up in 6 months.  Today he denies chest pain, shortness of breath, lower extremity edema, fatigue, palpitations, melena, hematuria, hemoptysis, diaphoresis, weakness, presyncope, syncope, orthopnea, and PND.   Home Medications    Prior to Admission medications   Medication Sig Start Date End Date Taking? Authorizing Provider  acetaminophen (TYLENOL) 325 MG tablet Take 2 tablets (650 mg total) by mouth every 4 (four) hours as needed for headache or mild pain. 01/15/20   Isaiah Serge, NP  albuterol (VENTOLIN HFA) 108 (90 Base) MCG/ACT inhaler INHALE 2 PUFFS INTO THE LUNGS EVERY 6 (SIX) HOURS AS NEEDED FOR WHEEZING OR SHORTNESS OF BREATH. 05/03/20 05/03/21  Glendale Chard,  MD  aspirin 81 MG chewable tablet Chew 1 tablet (81 mg total) by mouth daily. 01/16/20   Isaiah Serge, NP  atorvastatin (LIPITOR) 80 MG tablet TAKE 1 TABLET (80 MG TOTAL) BY MOUTH DAILY. 01/15/20 01/14/21  Isaiah Serge, NP  Blood Pressure Monitoring (OMRON 3 SERIES BP MONITOR) DEVI USE AS DIRECTED 12/19/19 12/18/20    Budeson-Glycopyrrol-Formoterol (BREZTRI AEROSPHERE) 160-9-4.8 MCG/ACT AERO Inhale 2 puffs into the lungs 2 (two) times daily. 07/15/20   Glendale Chard, MD  Carboxymethylcellul-Glycerin (CLEAR EYES FOR DRY EYES OP) Apply 1 drop to eye daily as needed (for dry eyes).    [provider]  clopidogrel (PLAVIX) 75 MG tablet TAKE 1 TABLET BY MOUTH DAILY. STOP BRILINTA 03/03/20 03/03/21  Skeet Latch, MD  cyclobenzaprine (FLEXERIL) 10 MG tablet TAKE 1 TABLET (10 MG TOTAL) BY MOUTH AS NEEDED FOR MUSCLE SPASMS. 02/26/20 02/25/21  Glendale Chard, MD  diclofenac Sodium (VOLTAREN) 1 % GEL Apply 2 g topically 2 (two) times daily.    [provider]  famotidine (PEPCID) 40 MG tablet TAKE 1 TABLET BY MOUTH TWICE DAILY. 03/19/20 03/19/21  Juanita Craver, MD   ipratropium-albuterol (DUONEB) 0.5-2.5 (3) MG/3ML SOLN Inhale 1 vial via nebulizer 4 times a day 06/08/20   Glendale Chard, MD  losartan (COZAAR) 50 MG tablet TAKE 1 TABLET BY MOUTH DAILY 01/29/20 01/28/21  Glendale Chard, MD  Magnesium 400 MG CAPS Take 400 mg by mouth daily. Patient not taking: Reported on 06/23/2020 06/05/18   Glendale Chard, MD  metoprolol tartrate (LOPRESSOR) 25 MG tablet TAKE 1/2 TABLET (12.5 MG TOTAL) BY MOUTH 2 (TWO) TIMES DAILY. Patient taking differently: Take 12.5 mg by mouth 2 (two) times daily. 01/15/20 01/14/21  Isaiah Serge, NP  metroNIDAZOLE (FLAGYL) 250 MG tablet TAKE 1 TABLET BY MOUTH THREE TIMES DAILY FOR 30 DAYS. 03/19/20 03/19/21  Juanita Craver, MD  montelukast (SINGULAIR) 10 MG tablet TAKE 1 TABLET BY MOUTH EVERY DAY IN THE EVENING 02/26/20 02/25/21  Glendale Chard, MD  nicotine (NICODERM CQ - DOSED IN MG/24 HR) 7 mg/24hr patch PLACE 1 PATCH (7 MG TOTAL) ONTO THE SKIN AT BEDTIME. 01/15/20 01/14/21  Isaiah Serge, NP  nitroGLYCERIN (NITROSTAT) 0.4 MG SL tablet PLACE 1 TABLET (0.4 MG TOTAL) UNDER THE TONGUE EVERY 5 (FIVE) MINUTES AS NEEDED FOR CHEST PAIN. 01/15/20 01/14/21  Isaiah Serge, NP  pantoprazole (PROTONIX) 40 MG tablet TAKE 1 TABLET (40 MG TOTAL) BY MOUTH DAILY.STOP OMEPRAZOLE Patient taking differently: Take 40 mg by mouth 2 (two) times daily. Patient says he is taking twice a day 03/03/20 03/03/21  Skeet Latch, MD  predniSONE (DELTASONE) 10 MG tablet TAKE 6 TABS BY MOUTH ON DAY 1; 5 TABS ON DAY 2; 4 TABS ON DAY 3; 3 TABS ON DAY 4; 2 TABS ON DAY 5; 1 TAB ON DAY 6 THEN STOP Patient not taking: Reported on 06/23/2020 06/10/20   Minette Brine, FNP  pregabalin (LYRICA) 50 MG capsule TAKE 1 CAPSULE BY MOUTH TWICE DAILY Patient taking differently: Take by mouth daily. 04/30/20 10/27/20  Glendale Chard, MD  Respiratory Therapy Supplies (FLUTTER) DEVI Use as directed 02/02/17   Tanda Rockers, MD  tamsulosin (FLOMAX) 0.4 MG CAPS capsule Take 1 capsule (0.4 mg  total) by mouth daily. 06/23/20   Glendale Chard, MD  omeprazole (PRILOSEC) 40 MG capsule TAKE 1 CAPSULE BY MOUTH EVERY DAY BEFORE A MEAL 11/05/19 03/03/20  Glendale Chard, MD    Family History    Family History  Adopted: Yes  Problem Relation Age of Onset   Cancer Father    Lung cancer Father    Heart failure Sister    Breast cancer Sister    Emphysema Mother    is adopted.   Social History    Social History   Socioeconomic History   Marital status: Married    Spouse name: Not on file   Number of children: 4   Years of education: Not on file   Highest education level: Some college, no degree  Occupational History   Occupation: Merchandiser, retail: UNEMPLOYED    Comment: Previous floor maintenance   Tobacco Use   Smoking status: Every Day    Packs/day: 0.50    Years: 45.00    Pack years: 22.50    Types: Cigarettes   Smokeless tobacco: Never   Tobacco comments:    04/22/20 Patient given 1-800-quit-now  and the link to [place] virtual smoking cessation link  Vaping Use   Vaping Use: Never used  Substance and Sexual Activity   Alcohol use: Yes    Comment: scotch once a week   Drug use: Yes    Types: Cocaine, Marijuana    Comment: crack cocaine quit 2003   Sexual activity: Not on file  Other Topics Concern   Not on file  Social History Narrative   Not on file   Social Determinants of Health   Financial Resource Strain: Not on file  Food Insecurity: Not on file  Transportation Needs: Not on file  Physical Activity: Not on file  Stress: Not on file  Social Connections: Not on file  Intimate Partner Violence: Not on file     Review of Systems    General:  No chills, fever, night sweats or weight changes.  Cardiovascular:  No chest pain, dyspnea on exertion, edema, orthopnea, palpitations, paroxysmal nocturnal dyspnea. Dermatological: No rash, lesions/masses Respiratory: No cough, dyspnea Urologic: No hematuria, dysuria Abdominal:   No nausea,  vomiting, diarrhea, bright red blood per rectum, melena, or hematemesis Neurologic:  No visual changes, wkns, changes in mental status. All other systems reviewed and are otherwise negative except as noted above.  Physical Exam    VS:  BP 122/84   Pulse (!) 45   Ht 6' (1.829 m)   Wt 179 lb 12.8 oz (81.6 kg)   SpO2 94%   BMI 24.39 kg/m  , BMI Body mass index is 24.39 kg/m. GEN: Well nourished, well developed, in no acute distress. HEENT: normal. Neck: Supple, no JVD, carotid bruits, or masses. Cardiac: RRR, no murmurs, rubs, or gallops. No clubbing, cyanosis, edema.  Radials/DP/PT 2+ and equal bilaterally.  Respiratory:  Respirations regular and unlabored, clear to auscultation bilaterally. GI: Soft, nontender, nondistended, BS + x 4. MS: no deformity or atrophy. Skin: warm and dry, no rash. Neuro:  Strength and sensation are intact. Psych: Normal affect.  Accessory Clinical Findings    Recent Labs: 01/13/2020: ALT 28 03/02/2020: BUN 11; Creatinine, Ser 0.97; Potassium 3.6; Sodium 138 06/23/2020: Hemoglobin 16.7; Platelets 255   Recent Lipid Panel    Component Value Date/Time   CHOL 151 06/23/2020 1232   TRIG 175 (H) 06/23/2020 1232   HDL 55 06/23/2020 1232   CHOLHDL 2.7 06/23/2020 1232   CHOLHDL 2.7 01/14/2020 0128   VLDL 12 01/14/2020 0128   LDLCALC 67 06/23/2020 1232    ECG personally reviewed by me today-none today.  Echocardiogram 09/29/11 Study Conclusions   Left ventricle: The cavity size was normal. Wall thickness  was normal. Systolic function was normal. The estimated  ejection fraction was in the range of 60% to 65%. Wall  motion was normal; there were no regional wall motion  abnormalities. Doppler parameters are consistent with  abnormal left ventricular relaxation (grade 1 diastolic  dysfunction).              Transthoracic echocardiography.  M-mode, complete 2D, spectral Doppler, and color Doppler.  Height:  Height: 182.9cm. Height: 72in.  Weight:   Weight:  92.3kg. Weight: 203.1lb.  Body mass index:  BMI: 27.6kg/m^2.   Body surface area:    BSA: 2.36m^2.  Blood pressure:  166/95.  Patient status:  Inpatient.  Location:  [facility].  Cardiac catheterization 01/14/2020 1.  Severe mid LAD stenosis (culprit lesion) treated successfully with PCI using a 3.5 x 16 mm Synergy DES 2.  Mild nonobstructive proximal LAD, mid circumflex, OM 2, and proximal RCA stenoses 3.  Normal LV systolic function with normal LVEDP   Recommendations: Aggressive medical therapy for secondary risk reduction.  Tobacco cessation.  The patient is loaded with ticagrelor 180 mg in the Cath Lab.  He is started on a high intensity statin drug and a beta-blocker.  Discussed cardiac catheterization findings with patient and his wife.  If no complications arise, should be eligible for hospital discharge within 24 hours.  Diagnostic Dominance: Right    Intervention     Assessment & Plan   1.  Precordial pain-noted one episode of muscle spasm type left chest pain 2 days ago.   Continues to be physically active.  Denies exertional chest discomfort. Continue to monitor. Heart healthy low-sodium diet-salty 6 given Increase physical activity as tolerated  Coronary artery disease-denies chest pain today.  Underwent cardiac catheterization 11/21 and received PCI with DES to his mid LAD. Continue aspirin, atorvastatin, Plavix, metoprolol, nitroglycerin Heart healthy low-sodium diet-salty 6 given Increase physical activity as tolerated  Hyperlipidemia-LDL 67 on 06/23/2020 Continue aspirin, atorvastatin Heart healthy low-sodium high-fiber diet increase physical activity as tolerated  Essential hypertension-BP today 122/ 84 Well-controlled at home. Continue metoprolol Heart healthy low-sodium diet-salty 6 given Increase physical activity as tolerated  Tobacco abuse-now using around 5 cigarettes per day. Smoking cessation information given Continues to use  nicotine patches and gum.  Near syncope-no further episodes to report. Continue to monitor  Disposition: Follow-up with Dr. Oval Linsey in 6 months.   Jossie Ng. Jameon Deller NP-C    08/10/2020, 9:13 AM [place] [address] Office [phone] Fax [phone]  Notice: This dictation was prepared with Dragon dictation along with smaller phrase technology. Any transcriptional errors that result from this process are unintentional and may not be corrected upon review.  I spent 13 minutes examining this patient, reviewing medications, and using patient centered shared decision making involving her cardiac care.  Prior to her visit I spent greater than 20 minutes reviewing her past medical history,  medications, and prior cardiac tests.

## 2020-08-09 ENCOUNTER — Other Ambulatory Visit (HOSPITAL_COMMUNITY): Payer: Self-pay

## 2020-08-09 MED FILL — Atorvastatin Calcium Tab 80 MG (Base Equivalent): ORAL | 90 days supply | Qty: 90 | Fill #0 | Status: AC

## 2020-08-09 MED FILL — Nitroglycerin SL Tab 0.4 MG: SUBLINGUAL | 25 days supply | Qty: 25 | Fill #1 | Status: AC

## 2020-08-10 ENCOUNTER — Other Ambulatory Visit: Payer: Self-pay

## 2020-08-10 ENCOUNTER — Encounter: Payer: Self-pay | Admitting: General Practice

## 2020-08-10 ENCOUNTER — Other Ambulatory Visit (HOSPITAL_COMMUNITY): Payer: Self-pay

## 2020-08-10 ENCOUNTER — Ambulatory Visit: Payer: 59 | Admitting: General Practice

## 2020-08-10 VITALS — BP 122/84 | HR 45 | Ht 72.0 in | Wt 179.8 lb

## 2020-08-10 DIAGNOSIS — Z720 Tobacco use: Secondary | ICD-10-CM

## 2020-08-10 DIAGNOSIS — E785 Hyperlipidemia, unspecified: Secondary | ICD-10-CM

## 2020-08-10 DIAGNOSIS — I10 Essential (primary) hypertension: Secondary | ICD-10-CM

## 2020-08-10 DIAGNOSIS — I2510 Atherosclerotic heart disease of native coronary artery without angina pectoris: Secondary | ICD-10-CM

## 2020-08-10 DIAGNOSIS — R55 Syncope and collapse: Secondary | ICD-10-CM

## 2020-08-10 DIAGNOSIS — R072 Precordial pain: Secondary | ICD-10-CM

## 2020-08-10 DIAGNOSIS — I2583 Coronary atherosclerosis due to lipid rich plaque: Secondary | ICD-10-CM | POA: Diagnosis not present

## 2020-08-10 MED ORDER — NITROGLYCERIN 0.4 MG SL SUBL
0.4000 mg | SUBLINGUAL_TABLET | SUBLINGUAL | 4 refills | Status: DC | PRN
  Filled 2020-08-10: qty 25, fill #0
  Filled 2020-12-13: qty 25, 5d supply, fill #0
  Filled 2021-03-01: qty 25, 5d supply, fill #1
  Filled 2021-05-24: qty 25, 5d supply, fill #2

## 2020-08-10 NOTE — Patient Instructions (Signed)
Medication Instructions:  The current medical regimen is effective;  continue present plan and medications as directed. Please refer to the Current Medication list given to you today.N *If you need a refill on your cardiac medications before your next appointment, please call your pharmacy*  Lab Work:   Testing/Procedures:  NONE    NONE  Special Instructions PLEASE READ AND FOLLOW SMOKING CESSATION TIPS-ATTACHED; YOU CAN ALSO CALL [email or web address] OR LOOK ONLINE [email or web address]  PLEASE READ AND FOLLOW SALTY 6-ATTACHED-1,800mg  daily  PLEASE MAINTAIN PHYSICAL ACTIVITY AS TOLERATED  Follow-Up: Your next appointment:  6 month(s) In Person with Skeet Latch, MD OR IF UNAVAILABLE [place], FNP-C  At [facility], you and your health needs are our priority.  As part of our continuing mission to provide you with exceptional heart care, we have created designated Provider Care Teams.  These Care Teams include your primary Cardiologist (physician) and Advanced Practice Providers (APPs -  Physician Assistants and Nurse Practitioners) who all work together to provide you with the care you need, when you need it.            6 SALTY THINGS TO AVOID     1,800MG  DAILY   Steps to Quit Smoking Smoking tobacco is the leading cause of preventable death. It can affect almost every organ in the body. Smoking puts you and people around you at risk for many serious, long-lasting (chronic) diseases. Quitting smoking can be hard, but it is one of the best things thatyou can do for your health. It is never too late to quit. How do I get ready to quit? When you decide to quit smoking, make a plan to help you succeed. Before you quit: Pick a date to quit. Set a date within the next 2 weeks to give you time to prepare. Write down the reasons why you are quitting. Keep this list in places where you will see it often. Tell your family, friends, and co-workers that you are quitting. Their support is  important. Talk with your doctor about the choices that may help you quit. Find out if your health insurance will pay for these treatments. Know the people, places, things, and activities that make you want to smoke (triggers). Avoid them. What first steps can I take to quit smoking? Throw away all cigarettes at home, at work, and in your car. Throw away the things that you use when you smoke, such as ashtrays and lighters. Clean your car. Make sure to empty the ashtray. Clean your home, including curtains and carpets. What can I do to help me quit smoking? Talk with your doctor about taking medicines and seeing a counselor at the same time. You are more likely to succeed when you do both. If you are pregnant or breastfeeding, talk with your doctor about counseling or other ways to quit smoking. Do not take medicine to help you quit smoking unless your doctor tells you to do so. To quit smoking: Quit right away Quit smoking totally, instead of slowly cutting back on how much you smoke over a period of time. Go to counseling. You are more likely to quit if you go to counseling sessions regularly. Take medicine You may take medicines to help you quit. Some medicines need a prescription, and some you can buy over-the-counter. Some medicines may contain a drug called nicotine to replace the nicotine in cigarettes. Medicines may: Help you to stop having the desire to smoke (cravings). Help to stop the problems that come when you  stop smoking (withdrawal symptoms). Your doctor may ask you to use: Nicotine patches, gum, or lozenges. Nicotine inhalers or sprays. Non-nicotine medicine that is taken by mouth. Find resources Find resources and other ways to help you quit smoking and remain smoke-free after you quit. These resources are most helpful when you use them often. They include: Online chats with a Social worker. Phone quitlines. Printed Furniture conservator/restorer. Support groups or group  counseling. Text messaging programs. Mobile phone apps. Use apps on your mobile phone or tablet that can help you stick to your quit plan. There are many free apps for mobile phones and tablets as well as websites. Examples include Quit Guide from the [employer] and smokefree.gov  What things can I do to make it easier to quit?  Talk to your family and friends. Ask them to support and encourage you. Call a phone quitline (1-800-QUIT-NOW), reach out to support groups, or work with a Social worker. Ask people who smoke to not smoke around you. Avoid places that make you want to smoke, such as: Bars. Parties. Smoke-break areas at work. Spend time with people who do not smoke. Lower the stress in your life. Stress can make you want to smoke. Try these things to help your stress: Getting regular exercise. Doing deep-breathing exercises. Doing yoga. Meditating. Doing a body scan. To do this, close your eyes, focus on one area of your body at a time from head to toe. Notice which parts of your body are tense. Try to relax the muscles in those areas. How will I feel when I quit smoking? Day 1 to 3 weeks Within the first 24 hours, you may start to have some problems that come from quitting tobacco. These problems are very bad 2-3 days after you quit, but they do not often last for more than 2-3 weeks. You may get these symptoms: Mood swings. Feeling restless, nervous, angry, or annoyed. Trouble concentrating. Dizziness. Strong desire for high-sugar foods and nicotine. Weight gain. Trouble pooping (constipation). Feeling like you may vomit (nausea). Coughing or a sore throat. Changes in how the medicines that you take for other issues work in your body. Depression. Trouble sleeping (insomnia). Week 3 and afterward After the first 2-3 weeks of quitting, you may start to notice more positive results, such as: Better sense of smell and taste. Less coughing and sore throat. Slower heart rate. Lower  blood pressure. Clearer skin. Better breathing. Fewer sick days. Quitting smoking can be hard. Do not give up if you fail the first time. Some people need to try a few times before they succeed. Do your best to stick to your quit plan, and talk with yourdoctor if you have any questions or concerns. Summary Smoking tobacco is the leading cause of preventable death. Quitting smoking can be hard, but it is one of the best things that you can do for your health. When you decide to quit smoking, make a plan to help you succeed. Quit smoking right away, not slowly over a period of time. When you start quitting, seek help from your doctor, family, or friends. This information is not intended to replace advice given to you by your health care provider. Make sure you discuss any questions you have with your healthcare provider. Document Revised: 11/08/2018 Document Reviewed: 05/04/2018 Elsevier Patient Education  [place].

## 2020-08-20 ENCOUNTER — Ambulatory Visit: Payer: 59 | Admitting: Adult Health

## 2020-08-26 ENCOUNTER — Other Ambulatory Visit: Payer: Self-pay | Admitting: Cardiology

## 2020-08-26 ENCOUNTER — Other Ambulatory Visit (HOSPITAL_COMMUNITY): Payer: Self-pay

## 2020-08-26 MED ORDER — METOPROLOL TARTRATE 25 MG PO TABS
12.5000 mg | ORAL_TABLET | Freq: Two times a day (BID) | ORAL | 2 refills | Status: DC
  Filled 2020-08-26: qty 90, 90d supply, fill #0
  Filled 2020-11-26: qty 90, 90d supply, fill #1
  Filled 2021-03-01: qty 90, 90d supply, fill #2

## 2020-08-26 MED FILL — Famotidine Tab 40 MG: ORAL | 30 days supply | Qty: 60 | Fill #0 | Status: AC

## 2020-08-26 MED FILL — Clopidogrel Bisulfate Tab 75 MG (Base Equiv): ORAL | 90 days supply | Qty: 90 | Fill #0 | Status: AC

## 2020-08-26 NOTE — Telephone Encounter (Signed)
Rx(s) sent to pharmacy electronically.  

## 2020-09-11 ENCOUNTER — Encounter (HOSPITAL_COMMUNITY): Payer: Self-pay | Admitting: Emergency Medicine

## 2020-09-11 ENCOUNTER — Emergency Department (HOSPITAL_COMMUNITY)
Admission: EM | Admit: 2020-09-11 | Discharge: 2020-09-11 | Disposition: A | Discharge status: Home / Self Care | Payer: 59 | Attending: Emergency Medicine | Admitting: Emergency Medicine

## 2020-09-11 ENCOUNTER — Emergency Department (HOSPITAL_COMMUNITY): Payer: 59

## 2020-09-11 ENCOUNTER — Other Ambulatory Visit: Payer: Self-pay

## 2020-09-11 DIAGNOSIS — I2510 Atherosclerotic heart disease of native coronary artery without angina pectoris: Secondary | ICD-10-CM | POA: Insufficient documentation

## 2020-09-11 DIAGNOSIS — K625 Hemorrhage of anus and rectum: Secondary | ICD-10-CM | POA: Diagnosis not present

## 2020-09-11 DIAGNOSIS — Z7982 Long term (current) use of aspirin: Secondary | ICD-10-CM | POA: Diagnosis not present

## 2020-09-11 DIAGNOSIS — Z8719 Personal history of other diseases of the digestive system: Secondary | ICD-10-CM

## 2020-09-11 DIAGNOSIS — I10 Essential (primary) hypertension: Secondary | ICD-10-CM | POA: Insufficient documentation

## 2020-09-11 DIAGNOSIS — K5730 Diverticulosis of large intestine without perforation or abscess without bleeding: Secondary | ICD-10-CM | POA: Diagnosis not present

## 2020-09-11 DIAGNOSIS — Z79899 Other long term (current) drug therapy: Secondary | ICD-10-CM | POA: Diagnosis not present

## 2020-09-11 DIAGNOSIS — R1084 Generalized abdominal pain: Secondary | ICD-10-CM | POA: Diagnosis not present

## 2020-09-11 DIAGNOSIS — K5792 Diverticulitis of intestine, part unspecified, without perforation or abscess without bleeding: Secondary | ICD-10-CM | POA: Diagnosis not present

## 2020-09-11 DIAGNOSIS — Z7902 Long term (current) use of antithrombotics/antiplatelets: Secondary | ICD-10-CM | POA: Insufficient documentation

## 2020-09-11 DIAGNOSIS — F17210 Nicotine dependence, cigarettes, uncomplicated: Secondary | ICD-10-CM | POA: Diagnosis not present

## 2020-09-11 DIAGNOSIS — I700 Atherosclerosis of aorta: Secondary | ICD-10-CM | POA: Diagnosis not present

## 2020-09-11 DIAGNOSIS — J449 Chronic obstructive pulmonary disease, unspecified: Secondary | ICD-10-CM | POA: Diagnosis not present

## 2020-09-11 DIAGNOSIS — K529 Noninfective gastroenteritis and colitis, unspecified: Secondary | ICD-10-CM | POA: Diagnosis not present

## 2020-09-11 LAB — URINALYSIS, ROUTINE W REFLEX MICROSCOPIC
Bilirubin Urine: NEGATIVE
Glucose, UA: NEGATIVE mg/dL
Hgb urine dipstick: NEGATIVE
Ketones, ur: NEGATIVE mg/dL
Leukocytes,Ua: NEGATIVE
Nitrite: NEGATIVE
Protein, ur: NEGATIVE mg/dL
Specific Gravity, Urine: 1.019 (ref 1.005–1.030)
pH: 5 (ref 5.0–8.0)

## 2020-09-11 LAB — COMPREHENSIVE METABOLIC PANEL
ALT: 25 U/L (ref 0–44)
AST: 22 U/L (ref 15–41)
Albumin: 3.6 g/dL (ref 3.5–5.0)
Alkaline Phosphatase: 95 U/L (ref 38–126)
Anion gap: 9 (ref 5–15)
BUN: 12 mg/dL (ref 8–23)
CO2: 24 mmol/L (ref 22–32)
Calcium: 9.4 mg/dL (ref 8.9–10.3)
Chloride: 104 mmol/L (ref 98–111)
Creatinine, Ser: 1.14 mg/dL (ref 0.61–1.24)
GFR, Estimated: >60 mL/min (ref >60)
Glucose, Bld: 108 mg/dL — ABNORMAL HIGH (ref 70–99)
Potassium: 3.9 mmol/L (ref 3.5–5.1)
Sodium: 137 mmol/L (ref 135–145)
Total Bilirubin: 1.4 mg/dL — ABNORMAL HIGH (ref 0.3–1.2)
Total Protein: 7.1 g/dL (ref 6.5–8.1)

## 2020-09-11 LAB — CBC
HCT: 48.6 % (ref 39.0–52.0)
Hemoglobin: 16 g/dL (ref 13.0–17.0)
MCH: 28.7 pg (ref 26.0–34.0)
MCHC: 32.9 g/dL (ref 30.0–36.0)
MCV: 87.3 fL (ref 80.0–100.0)
Platelets: 211 10*3/uL (ref 150–400)
RBC: 5.57 MIL/uL (ref 4.22–5.81)
RDW: 15.3 % (ref 11.5–15.5)
WBC: 11.9 10*3/uL — ABNORMAL HIGH (ref 4.0–10.5)
nRBC: 0 % (ref 0.0–0.2)

## 2020-09-11 LAB — POC OCCULT BLOOD, ED: Fecal Occult Bld: POSITIVE — AB

## 2020-09-11 LAB — LIPASE, BLOOD: Lipase: 33 U/L (ref 11–51)

## 2020-09-11 MED ORDER — CIPROFLOXACIN HCL 500 MG PO TABS: 500.0000 mg | ORAL_TABLET | Freq: Two times a day (BID) | ORAL | 0 refills | Status: DC

## 2020-09-11 MED ORDER — METRONIDAZOLE 500 MG PO TABS
500.0000 mg | ORAL_TABLET | Freq: Once | ORAL | Status: AC
  Administered 2020-09-11: 500 mg via ORAL
  Filled 2020-09-11: qty 1

## 2020-09-11 MED ORDER — METRONIDAZOLE 500 MG PO TABS: 500.0000 mg | ORAL_TABLET | Freq: Four times a day (QID) | ORAL | 0 refills | Status: DC

## 2020-09-11 MED ORDER — IOHEXOL 300 MG/ML  SOLN
100.0000 mL | Freq: Once | INTRAMUSCULAR | Status: AC | PRN
  Administered 2020-09-11: 100 mL via INTRAVENOUS

## 2020-09-11 MED ORDER — CIPROFLOXACIN HCL 500 MG PO TABS
500.0000 mg | ORAL_TABLET | Freq: Once | ORAL | Status: AC
  Administered 2020-09-11: 500 mg via ORAL
  Filled 2020-09-11: qty 1

## 2020-09-11 NOTE — ED Notes (Signed)
CT notified that pt is ready for CT with PIV.

## 2020-09-11 NOTE — ED Provider Notes (Signed)
[facility] EMERGENCY DEPARTMENT Provider Note   CSN: [id number] Arrival date & time: 09/11/20  1456     History Chief Complaint  Patient presents with   Abdominal Pain   Rectal Bleeding    Peter Sims is a 70 y.o. male.  Patient c/o bright red blood per rectum. Symptoms acute onset today, episodic, moderate. States he felt he may be constipated in past day, was straining to have bm, then later took a dose or miralax, and then had bm. When went to wipe noted brown stool with streaks of bright red blood. States later he passed gas, and was a small amount brb, no stool. Then subsequently had bm later, and again, small streaks of brb. Denies black or melanotic stools. Denies hx gi bleeding, but does have hx diverticula. No hx pud. Also c/o mid/general abd discomfort in past day, cramping, dull, esp left. Denies faintness or dizziness. No syncope. No chest pain or sob. No unusual doe. Denies nsaid use. Is on plavix. No other abnormal bruising or bleeding. Has seen gi Dr Collene Mares in past - indicates prior routine colonoscopy normal, no mass (only hx diverticula).   The history is provided by the patient and the spouse.  Abdominal Pain Associated symptoms: hematochezia   Associated symptoms: no chest pain, no fever, no hematuria, no shortness of breath and no vomiting   Rectal Bleeding Associated symptoms: abdominal pain   Associated symptoms: no epistaxis, no fever, no light-headedness and no vomiting       Past Medical History:  Diagnosis Date   Arthritis    Bullous emphysema (HCC)    Severe Bilateral   Cocaine abuse ([place])    Colitis    with bleeding   COPD (chronic obstructive pulmonary disease) (HCC)    Coronary artery disease    Dyspnea    GERD (gastroesophageal reflux disease)    Headache    sinusitis   Hypertension    LBP (low back pain)    now chronic   Lumbago    Lung mass    Right Upper Lobe pleural based mass, negative  on PET Scan   Neck  pain    nerve pain    Pneumonia    hx   S/P angioplasty with stent 01/13/20 emergent DES to mLAD  01/15/2020   Shortness of breath 03/03/2020   Spine pain    nerve   Therapeutic drug monitoring    Tobacco abuse     Patient Active Problem List   Diagnosis Date Noted   Other abnormal glucose 06/23/2020   Benign prostatic hyperplasia with weak urinary stream 06/23/2020   Onychomycosis 06/23/2020   Tobacco abuse 03/03/2020   Shortness of breath 03/03/2020   S/P angioplasty with stent 01/13/20 emergent DES to mLAD  01/15/2020   ST elevation myocardial infarction involving left anterior descending (LAD) coronary artery ([place]) 01/14/2020   NSTEMI (non-ST elevated myocardial infarction) ([place]) 01/13/2020   Aortic atherosclerosis ([place]) 12/02/2019   Coronary atherosclerosis due to lipid rich plaque 12/02/2019   COPD with chronic bronchitis ([place]) 12/02/2019   Mucopurulent chronic bronchitis ([place]) 12/02/2019   Constipation 03/05/2018   Hyperlipidemia 03/05/2018   Parenchymal renal hypertension 03/05/2018   Tobacco abuse counseling 03/05/2018   Cervical disc disorder at C4-C5 level with radiculopathy 06/18/2017   Chronic cough 02/02/2017   Essential hypertension, benign 11/21/2016   HNP (herniated nucleus pulposus), lumbar 05/03/2016   Bronchopneumonia 09/28/2011   COPD with exacerbation ([place]) 09/28/2011   Chest pain 09/28/2011  Lumbago    Cigarette smoker    COPD GOLD 0/ ab with pseudowheeze component  03/07/2011   Cocaine abuse (HCC)    GERD (gastroesophageal reflux disease)    Bullous emphysema (HCC)    Lung mass     Past Surgical History:  Procedure Laterality Date   CARDIAC CATHETERIZATION     COLONOSCOPY     CORONARY/GRAFT ACUTE MI REVASCULARIZATION N/A 01/14/2020   Procedure: Coronary/Graft Acute MI Revascularization;  Surgeon: Sherren Mocha, MD;  Location: [place] [facility];  Service: Cardiovascular;  Laterality: N/A;   LEFT HEART CATH AND CORONARY ANGIOGRAPHY N/A  01/14/2020   Procedure: LEFT HEART CATH AND CORONARY ANGIOGRAPHY;  Surgeon: Sherren Mocha, MD;  Location: [place] [facility];  Service: Cardiovascular;  Laterality: N/A;   LUMBAR LAMINECTOMY/DECOMPRESSION MICRODISCECTOMY Right 05/03/2016   Procedure: MICRODISCECTOMY LUMBAR FIVE- SACRAL ONE RIGHT;  Surgeon: Ashok Pall, MD;  Location: [place];  Service: Neurosurgery;  Laterality: Right;   NO PAST SURGERIES         Family History  Adopted: Yes  Problem Relation Age of Onset   Cancer Father    Lung cancer Father    Heart failure Sister    Breast cancer Sister    Emphysema Mother     Social History   Tobacco Use   Smoking status: Every Day    Packs/day: 0.50    Years: 45.00    Pack years: 22.50    Types: Cigarettes   Smokeless tobacco: Never   Tobacco comments:    04/22/20 Patient given 1-800-quit-now  and the link to [place] virtual smoking cessation link  Vaping Use   Vaping Use: Never used  Substance Use Topics   Alcohol use: Yes    Comment: scotch once a week   Drug use: Yes    Types: Cocaine, Marijuana    Comment: crack cocaine quit 2003    Home Medications Prior to Admission medications   Medication Sig Start Date End Date Taking? Authorizing Provider  acetaminophen (TYLENOL) 325 MG tablet Take 2 tablets (650 mg total) by mouth every 4 (four) hours as needed for headache or mild pain. 01/15/20   Isaiah Serge, NP  albuterol (VENTOLIN HFA) 108 (90 Base) MCG/ACT inhaler INHALE 2 PUFFS INTO THE LUNGS EVERY 6 (SIX) HOURS AS NEEDED FOR WHEEZING OR SHORTNESS OF BREATH. 05/03/20 05/03/21  Glendale Chard, MD  aspirin 81 MG chewable tablet Chew 1 tablet (81 mg total) by mouth daily. 01/16/20   Isaiah Serge, NP  atorvastatin (LIPITOR) 80 MG tablet TAKE 1 TABLET (80 MG TOTAL) BY MOUTH DAILY. 01/15/20 01/14/21  Isaiah Serge, NP  Blood Pressure Monitoring (OMRON 3 SERIES BP MONITOR) DEVI USE AS DIRECTED 12/19/19 12/18/20    Budeson-Glycopyrrol-Formoterol (BREZTRI  AEROSPHERE) 160-9-4.8 MCG/ACT AERO Inhale 2 puffs into the lungs 2 (two) times daily. 07/15/20   Glendale Chard, MD  Carboxymethylcellul-Glycerin (CLEAR EYES FOR DRY EYES OP) Apply 1 drop to eye daily as needed (for dry eyes).    [provider]  clopidogrel (PLAVIX) 75 MG tablet TAKE 1 TABLET BY MOUTH DAILY. STOP BRILINTA 03/03/20 03/03/21  Skeet Latch, MD  cyclobenzaprine (FLEXERIL) 10 MG tablet TAKE 1 TABLET (10 MG TOTAL) BY MOUTH AS NEEDED FOR MUSCLE SPASMS. 02/26/20 02/25/21  Glendale Chard, MD  diclofenac Sodium (VOLTAREN) 1 % GEL Apply 2 g topically 2 (two) times daily.    [provider]  famotidine (PEPCID) 40 MG tablet TAKE 1 TABLET BY MOUTH TWICE DAILY. 03/19/20 03/19/21  Juanita Craver, MD  ipratropium-albuterol (DUONEB) 0.5-2.5 (3) MG/3ML SOLN Inhale 1 vial via nebulizer 4 times a day 06/08/20   Glendale Chard, MD  losartan (COZAAR) 50 MG tablet TAKE 1 TABLET BY MOUTH DAILY 01/29/20 01/28/21  Glendale Chard, MD  Magnesium 400 MG CAPS Take 400 mg by mouth daily. 06/05/18   Glendale Chard, MD  metoprolol tartrate (LOPRESSOR) 25 MG tablet Take 0.5 tablets (12.5 mg total) by mouth 2 (two) times daily. 08/26/20   Skeet Latch, MD  metroNIDAZOLE (FLAGYL) 250 MG tablet TAKE 1 TABLET BY MOUTH THREE TIMES DAILY FOR 30 DAYS. 03/19/20 03/19/21  Juanita Craver, MD  montelukast (SINGULAIR) 10 MG tablet TAKE 1 TABLET BY MOUTH EVERY DAY IN THE EVENING 02/26/20 02/25/21  Glendale Chard, MD  nicotine (NICODERM CQ - DOSED IN MG/24 HR) 7 mg/24hr patch PLACE 1 PATCH (7 MG TOTAL) ONTO THE SKIN AT BEDTIME. 01/15/20 01/14/21  Isaiah Serge, NP  nitroGLYCERIN (NITROSTAT) 0.4 MG SL tablet PLACE 1 TABLET (0.4 MG TOTAL) UNDER THE TONGUE EVERY 5 (FIVE) MINUTES AS NEEDED FOR CHEST PAIN. 08/10/20 08/10/21  Deberah Pelton, NP  pantoprazole (PROTONIX) 40 MG tablet TAKE 1 TABLET (40 MG TOTAL) BY MOUTH DAILY.STOP OMEPRAZOLE Patient taking differently: Take 40 mg by mouth 2 (two) times daily. Patient says he is  taking twice a day 03/03/20 03/03/21  Skeet Latch, MD  predniSONE (DELTASONE) 10 MG tablet TAKE 6 TABS BY MOUTH ON DAY 1; 5 TABS ON DAY 2; 4 TABS ON DAY 3; 3 TABS ON DAY 4; 2 TABS ON DAY 5; 1 TAB ON DAY 6 THEN STOP 06/10/20   Minette Brine, FNP  pregabalin (LYRICA) 50 MG capsule TAKE 1 CAPSULE BY MOUTH TWICE DAILY Patient taking differently: Take by mouth daily. 04/30/20 10/27/20  Glendale Chard, MD  Respiratory Therapy Supplies (FLUTTER) DEVI Use as directed 02/02/17   Tanda Rockers, MD  tamsulosin (FLOMAX) 0.4 MG CAPS capsule Take 1 capsule (0.4 mg total) by mouth daily. 06/23/20   Glendale Chard, MD  omeprazole (PRILOSEC) 40 MG capsule TAKE 1 CAPSULE BY MOUTH EVERY DAY BEFORE A MEAL 11/05/19 03/03/20  Glendale Chard, MD    Allergies    Penicillins  Review of Systems   Review of Systems  Constitutional:  Negative for fever.  HENT:  Negative for nosebleeds.   Eyes:  Negative for redness.  Respiratory:  Negative for shortness of breath.   Cardiovascular:  Negative for chest pain.  Gastrointestinal:  Positive for abdominal pain, blood in stool and hematochezia. Negative for vomiting.  Genitourinary:  Negative for flank pain and hematuria.  Musculoskeletal:  Negative for back pain and neck pain.  Skin:  Negative for rash.  Neurological:  Negative for light-headedness.  Hematological:  Does not bruise/bleed easily.  Psychiatric/Behavioral:  Negative for confusion.    Physical Exam Updated Vital Signs BP 117/78   Pulse 90   Temp 98 F (36.7 C) (Oral)   Resp 16   Ht 1.829 m (6')   Wt 79.4 kg   SpO2 98%   BMI 23.73 kg/m   Physical Exam Vitals and nursing note reviewed.  Constitutional:      Appearance: Normal appearance. He is well-developed.  HENT:     Head: Atraumatic.     Nose: Nose normal.     Mouth/Throat:     Mouth: Mucous membranes are moist.     Pharynx: Oropharynx is clear.  Eyes:     General: No scleral icterus.    Conjunctiva/sclera: Conjunctivae normal.  Neck:      Trachea: No tracheal deviation.  Cardiovascular:     Rate and Rhythm: Normal rate and regular rhythm.     Pulses: Normal pulses.     Heart sounds: Normal heart sounds. No murmur heard.   No friction rub. No gallop.  Pulmonary:     Effort: Pulmonary effort is normal. No accessory muscle usage or respiratory distress.     Breath sounds: Normal breath sounds.  Abdominal:     General: Bowel sounds are normal. There is no distension.     Palpations: Abdomen is soft. There is no mass.     Tenderness: There is abdominal tenderness. There is no guarding.     Comments: Mild mid to left abd tenderness. No peritoneal signs.   Genitourinary:    Comments: No cva tenderness. Light brown stool, sent for hemoccult. No mass felt. No fissure or hemorrhoids visualized.  Musculoskeletal:        General: No swelling.     Cervical back: Normal range of motion and neck supple. No rigidity.  Skin:    General: Skin is warm and dry.     Findings: No rash.  Neurological:     Mental Status: He is alert.     Comments: Alert, speech clear.   Psychiatric:        Mood and Affect: Mood normal.    ED Results / Procedures / Treatments   Labs (all labs ordered are listed, but only abnormal results are displayed) Results for orders placed or performed during the hospital encounter of 09/11/20  Lipase, blood  Result Value Ref Range   Lipase 33 11 - 51 U/L  Comprehensive metabolic panel  Result Value Ref Range   Sodium 137 135 - 145 mmol/L   Potassium 3.9 3.5 - 5.1 mmol/L   Chloride 104 98 - 111 mmol/L   CO2 24 22 - 32 mmol/L   Glucose, Bld 108 (H) 70 - 99 mg/dL   BUN 12 8 - 23 mg/dL   Creatinine, Ser 1.14 0.61 - 1.24 mg/dL   Calcium 9.4 8.9 - 10.3 mg/dL   Total Protein 7.1 6.5 - 8.1 g/dL   Albumin 3.6 3.5 - 5.0 g/dL   AST 22 15 - 41 U/L   ALT 25 0 - 44 U/L   Alkaline Phosphatase 95 38 - 126 U/L   Total Bilirubin 1.4 (H) 0.3 - 1.2 mg/dL   GFR, Estimated >60 >60 mL/min   Anion gap 9 5 - 15  CBC   Result Value Ref Range   WBC 11.9 (H) 4.0 - 10.5 K/uL   RBC 5.57 4.22 - 5.81 MIL/uL   Hemoglobin 16.0 13.0 - 17.0 g/dL   HCT 48.6 39.0 - 52.0 %   MCV 87.3 80.0 - 100.0 fL   MCH 28.7 26.0 - 34.0 pg   MCHC 32.9 30.0 - 36.0 g/dL   RDW 15.3 11.5 - 15.5 %   Platelets 211 150 - 400 K/uL   nRBC 0.0 0.0 - 0.2 %  Urinalysis, Routine w reflex microscopic Urine, Clean Catch  Result Value Ref Range   Color, Urine YELLOW YELLOW   APPearance CLEAR CLEAR   Specific Gravity, Urine 1.019 1.005 - 1.030   pH 5.0 5.0 - 8.0   Glucose, UA NEGATIVE NEGATIVE mg/dL   Hgb urine dipstick NEGATIVE NEGATIVE   Bilirubin Urine NEGATIVE NEGATIVE   Ketones, ur NEGATIVE NEGATIVE mg/dL   Protein, ur NEGATIVE NEGATIVE mg/dL   Nitrite NEGATIVE NEGATIVE   Leukocytes,Ua  NEGATIVE NEGATIVE  POC occult blood, ED Provider will collect  Result Value Ref Range   Fecal Occult Bld POSITIVE (A) NEGATIVE     EKG None  Radiology CT Abdomen Pelvis W Contrast  Result Date: 09/11/2020 CLINICAL DATA:  Abdomen pain and rectal bleeding EXAM: CT ABDOMEN AND PELVIS WITH CONTRAST TECHNIQUE: Multidetector CT imaging of the abdomen and pelvis was performed using the standard protocol following bolus administration of intravenous contrast. CONTRAST:  163mL OMNIPAQUE IOHEXOL 300 MG/ML  SOLN COMPARISON:  CT 03/19/2020 FINDINGS: Lower chest: Lung bases demonstrate mild subpleural fibrosis. No acute consolidation or effusion. Hepatobiliary: No focal liver abnormality is seen. No gallstones, gallbladder wall thickening, or biliary dilatation. Pancreas: Unremarkable. No pancreatic ductal dilatation or surrounding inflammatory changes. Spleen: Normal in size without focal abnormality. Adrenals/Urinary Tract: Adrenal glands are unremarkable. Kidneys are normal, without renal calculi, focal lesion, or hydronephrosis. Bladder is unremarkable. Stomach/Bowel: Stomach is nonenlarged. No dilated small bowel. Diffuse diverticular disease of the colon.  Wall thickening and inflammatory change at the sigmoid colon consistent with acute diverticulitis. No perforation or abscess. Vascular/Lymphatic: Moderate aortic atherosclerosis. No suspicious nodes. 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 This is stable. Reproductive: Prostate is unremarkable. Other: Negative for free air or free fluid Musculoskeletal: No acute or suspicious osseous abnormality IMPRESSION: 1. Diverticular disease of the colon with wall thickening and inflammatory change at sigmoid colon, likely representing acute sigmoid colon diverticulitis. No perforation or abscess. Colonoscopy follow-up after resolution of acute symptoms suggested if not already performed given more focally thickened appearance of distal sigmoid colon as noted on CT from 03/19/2020. Electronically Signed   By: Donavan Foil M.D.   On: 09/11/2020 18:13    Procedures Procedures   Medications Ordered in ED Medications - No data to display  ED Course  I have reviewed the triage vital signs and the nursing notes.  Pertinent labs & imaging results that were available during my care of the patient were reviewed by me and considered in my medical decision making (see chart for details).    MDM Rules/Calculators/A&P                         Iv ns. Labs sent.   Reviewed nursing notes and prior charts for additional history.   Labs reviewed/interpreted by me - hgb normal.  Stool is light brown, +streak brb,tests heme pos.   Imaging pending.   CT reviewed/interpreted by me - ?diverticulitis.   Allergy to pcn. Rx cipro and flagyl.    Recheck abd soft no peritonitis. Vitals normal. Bun and hgb normal. Stool light brown. Pt currently appears stable for d/c.   Rec gi g/u.   Return precautions provided.      Final Clinical Impression(s) / ED Diagnoses Final diagnoses:  None    Rx / DC Orders ED Discharge Orders     None        Lajean Saver, MD 09/11/20 1840

## 2020-09-11 NOTE — ED Triage Notes (Signed)
Patient coming from home, complaint of abdominal pain that started yesterday, and rectal bleeding that started today.

## 2020-09-11 NOTE — Discharge Instructions (Signed)
It was our pleasure to provide your ER care today - we hope that you feel better.  Drink plenty of fluids/stay well hydrated.   Take antibiotics as prescribed. Take acetaminophen as need.   Follow up with your GI doctor this coming week - call office Monday AM to arrange appointment.   Return to ER if worse, new symptoms, high fevers, new, worsening or severe pain, persistent vomiting, heavy bleeding, weak/faint, or other concern.

## 2020-09-17 ENCOUNTER — Other Ambulatory Visit (HOSPITAL_COMMUNITY): Payer: Self-pay

## 2020-09-29 ENCOUNTER — Encounter: Payer: Self-pay | Admitting: Internal Medicine

## 2020-09-29 ENCOUNTER — Other Ambulatory Visit: Payer: Self-pay

## 2020-09-29 ENCOUNTER — Ambulatory Visit: Payer: 59 | Admitting: Internal Medicine

## 2020-09-29 VITALS — BP 118/70 | HR 84 | Temp 98.4°F | Ht 72.0 in | Wt 171.0 lb

## 2020-09-29 DIAGNOSIS — Z8719 Personal history of other diseases of the digestive system: Secondary | ICD-10-CM

## 2020-09-29 DIAGNOSIS — R634 Abnormal weight loss: Secondary | ICD-10-CM

## 2020-09-29 NOTE — Progress Notes (Signed)
I,[name],acting as a Education administrator for Peter Greenland, MD.,have documented all relevant documentation on the behalf of Peter Greenland, MD,as directed by  Peter Greenland, MD while in the presence of Peter Greenland, MD.  This visit occurred during the SARS-CoV-2 public health emergency.  Safety protocols were in place, including screening questions prior to the visit, additional usage of staff PPE, and extensive cleaning of exam room while observing appropriate contact time as indicated for disinfecting solutions.  Subjective:     [name] , male    DOB: 11-Jan-1951 , 70 y.o.   MRN: [id number]   Chief Complaint  Patient presents with   Weight Loss     HPI  He presents today for evaluation of weight loss.  He feels that he has lost too much weight in the past six months. He admits that he changed his diet drastically when he was diagnosed with CAD in 2021. Additionally, he is upset b/c he is unable to eat collard greens. His wife read that he should not eat collard greens b/c he is on a blood-thinner. He is currently taking Plavix. He is not on warfarin. He reports having a great appetite, but is concerned b/c he feels he has lost too much weight. He denies early satiety, fever, chills.     Past Medical History:  Diagnosis Date   Arthritis    Bullous emphysema (HCC)    Severe Bilateral   Cocaine abuse ([place])    Colitis    with bleeding   COPD (chronic obstructive pulmonary disease) (HCC)    Coronary artery disease    Dyspnea    GERD (gastroesophageal reflux disease)    Headache    sinusitis   Hypertension    LBP (low back pain)    now chronic   Lumbago    Lung mass    Right Upper Lobe pleural based mass, negative  on PET Scan   Neck pain    nerve pain    Pneumonia    hx   S/P angioplasty with stent 01/13/20 emergent DES to mLAD  01/15/2020   Shortness of breath 03/03/2020   Spine pain    nerve   Therapeutic drug monitoring    Tobacco abuse       Family History  Adopted: Yes  Problem Relation Age of Onset   Cancer Father    Lung cancer Father    Heart failure Sister    Breast cancer Sister    Emphysema Mother      Current Outpatient Medications:    acetaminophen (TYLENOL) 325 MG tablet, Take 2 tablets (650 mg total) by mouth every 4 (four) hours as needed for headache or mild pain., Disp: , Rfl:    albuterol (VENTOLIN HFA) 108 (90 Base) MCG/ACT inhaler, INHALE 2 PUFFS INTO THE LUNGS EVERY 6 (SIX) HOURS AS NEEDED FOR WHEEZING OR SHORTNESS OF BREATH., Disp: 8.5 g, Rfl: 2   aspirin 81 MG chewable tablet, Chew 1 tablet (81 mg total) by mouth daily., Disp: , Rfl:    atorvastatin (LIPITOR) 80 MG tablet, TAKE 1 TABLET (80 MG TOTAL) BY MOUTH DAILY., Disp: 30 tablet, Rfl: 6   Blood Pressure Monitoring (OMRON 3 SERIES BP MONITOR) DEVI, USE AS DIRECTED, Disp: 1 each, Rfl: 0   Budeson-Glycopyrrol-Formoterol (BREZTRI AEROSPHERE) 160-9-4.8 MCG/ACT AERO, Inhale 2 puffs into the lungs 2 (two) times daily., Disp: 10.7 g, Rfl: 2   Carboxymethylcellul-Glycerin (CLEAR EYES FOR DRY EYES OP), Apply 1 drop to  eye daily as needed (for dry eyes)., Disp: , Rfl:    ciprofloxacin (CIPRO) 500 MG tablet, Take 1 tablet (500 mg total) by mouth 2 (two) times daily., Disp: 14 tablet, Rfl: 0   clopidogrel (PLAVIX) 75 MG tablet, TAKE 1 TABLET BY MOUTH DAILY. STOP BRILINTA, Disp: 93 tablet, Rfl: 3   cyclobenzaprine (FLEXERIL) 10 MG tablet, TAKE 1 TABLET (10 MG TOTAL) BY MOUTH AS NEEDED FOR MUSCLE SPASMS., Disp: 30 tablet, Rfl: 0   diclofenac Sodium (VOLTAREN) 1 % GEL, Apply 2 g topically 2 (two) times daily., Disp: , Rfl:    famotidine (PEPCID) 40 MG tablet, TAKE 1 TABLET BY MOUTH TWICE DAILY., Disp: 60 tablet, Rfl: 12   ipratropium-albuterol (DUONEB) 0.5-2.5 (3) MG/3ML SOLN, Inhale 1 vial via nebulizer 4 times a day, Disp: 270 mL, Rfl: PRN   losartan (COZAAR) 50 MG tablet, Take 1 tablet (50 mg total) by mouth daily., Disp: 90 tablet, Rfl: 2   Magnesium 400 MG  CAPS, Take 400 mg by mouth daily., Disp: 30 capsule, Rfl: 1   metoprolol tartrate (LOPRESSOR) 25 MG tablet, Take 0.5 tablets (12.5 mg total) by mouth 2 (two) times daily., Disp: 90 tablet, Rfl: 2   metroNIDAZOLE (FLAGYL) 500 MG tablet, Take 1 tablet (500 mg total) by mouth 4 (four) times daily., Disp: 28 tablet, Rfl: 0   montelukast (SINGULAIR) 10 MG tablet, TAKE 1 TABLET BY MOUTH EVERY DAY IN THE EVENING, Disp: 30 tablet, Rfl: 3   nicotine (NICODERM CQ - DOSED IN MG/24 HR) 7 mg/24hr patch, PLACE 1 PATCH (7 MG TOTAL) ONTO THE SKIN AT BEDTIME., Disp: 28 patch, Rfl: 0   nitroGLYCERIN (NITROSTAT) 0.4 MG SL tablet, PLACE 1 TABLET (0.4 MG TOTAL) UNDER THE TONGUE EVERY 5 (FIVE) MINUTES AS NEEDED FOR CHEST PAIN., Disp: 25 tablet, Rfl: 4   pantoprazole (PROTONIX) 40 MG tablet, TAKE 1 TABLET (40 MG TOTAL) BY MOUTH DAILY.STOP OMEPRAZOLE (Patient taking differently: Take 40 mg by mouth 2 (two) times daily. Patient says he is taking twice a day), Disp: 90 tablet, Rfl: 3   predniSONE (DELTASONE) 10 MG tablet, TAKE 6 TABS BY MOUTH ON DAY 1; 5 TABS ON DAY 2; 4 TABS ON DAY 3; 3 TABS ON DAY 4; 2 TABS ON DAY 5; 1 TAB ON DAY 6 THEN STOP, Disp: 21 tablet, Rfl: 0   pregabalin (LYRICA) 50 MG capsule, TAKE 1 CAPSULE BY MOUTH TWICE DAILY (Patient taking differently: Take by mouth daily.), Disp: 60 capsule, Rfl: 3   Respiratory Therapy Supplies (FLUTTER) DEVI, Use as directed, Disp: 1 each, Rfl: 0   tamsulosin (FLOMAX) 0.4 MG CAPS capsule, Take 1 capsule (0.4 mg total) by mouth daily., Disp: 90 capsule, Rfl: 2   Allergies  Allergen Reactions   Penicillins Anaphylaxis and Hives    Has patient had a PCN reaction causing immediate rash, facial/tongue/throat swelling, SOB or lightheadedness with hypotension: No Has patient had a PCN reaction causing severe rash involving mucus membranes or skin necrosis: No Has patient had a PCN reaction that required hospitalization No Has patient had a PCN reaction occurring within the last  10 years: No If all of the above answers are "NO", then may proceed with Cephalosporin use.       Review of Systems  Constitutional:  Positive for unexpected weight change.  Respiratory: Negative.    Cardiovascular: Negative.   Gastrointestinal: Negative.   Neurological: Negative.   Psychiatric/Behavioral: Negative.      Today's Vitals   09/29/20 1451  BP: 118/70  Pulse: 84  Temp: 98.4 F (36.9 C)  TempSrc: Oral  Weight: 171 lb (77.6 kg)  Height: 6' (1.829 m)   Body mass index is 23.19 kg/m.  Wt Readings from Last 3 Encounters:  09/29/20 171 lb (77.6 kg)  09/11/20 175 lb (79.4 kg)  08/10/20 179 lb 12.8 oz (81.6 kg)    BP Readings from Last 3 Encounters:  09/29/20 118/70  09/11/20 125/74  08/10/20 122/84    Objective:  Physical Exam Vitals and nursing note reviewed.  Constitutional:      Appearance: Normal appearance.  HENT:     Head: Normocephalic and atraumatic.     Nose:     Comments: Masked     Mouth/Throat:     Comments: Masked  Cardiovascular:     Rate and Rhythm: Normal rate and regular rhythm.     Heart sounds: Normal heart sounds.  Pulmonary:     Effort: Pulmonary effort is normal.     Breath sounds: Normal breath sounds.  Skin:    General: Skin is warm.  Neurological:     General: No focal deficit present.     Mental Status: He is alert.  Psychiatric:        Mood and Affect: Mood normal.        Assessment And Plan:     1. Weight loss, abnormal Comments: He agrees to Nutrition evaluation/referral. He has lost 15 lbs since Jan 2022. Advised he may incorporate green leafy veggies, including collards into his diet. He agrees to Nutrition eval for further dietary planning. I will also check labs as listed below.  - TSH - CBC no Diff - Prealbumin - Referral to Nutrition and Diabetes Services  2. History of diverticulitis Comments: He recently completed abx course.     Patient was given opportunity to ask questions. Patient verbalized  understanding of the plan and was able to repeat key elements of the plan. All questions were answered to their satisfaction.   I, Peter Greenland, MD, have reviewed all documentation for this visit. The documentation on 09/29/20 for the exam, diagnosis, procedures, and orders are all accurate and complete.   IF YOU HAVE BEEN REFERRED TO A SPECIALIST, IT MAY TAKE 1-2 WEEKS TO SCHEDULE/PROCESS THE REFERRAL. IF YOU HAVE NOT HEARD FROM US/SPECIALIST IN TWO WEEKS, PLEASE GIVE [place] A CALL AT [phone].   THE PATIENT IS ENCOURAGED TO PRACTICE SOCIAL DISTANCING DUE TO THE COVID-19 PANDEMIC.

## 2020-09-30 ENCOUNTER — Other Ambulatory Visit (HOSPITAL_COMMUNITY): Payer: Self-pay

## 2020-09-30 LAB — CBC
Hematocrit: 47.7 % (ref 37.5–51.0)
Hemoglobin: 16.3 g/dL (ref 13.0–17.7)
MCH: 28.6 pg (ref 26.6–33.0)
MCHC: 34.2 g/dL (ref 31.5–35.7)
MCV: 84 fL (ref 79–97)
Platelets: 246 10*3/uL (ref 150–450)
RBC: 5.69 x10E6/uL (ref 4.14–5.80)
RDW: 14.2 % (ref 11.6–15.4)
WBC: 6 10*3/uL (ref 3.4–10.8)

## 2020-09-30 LAB — TSH: TSH: 1.51 u[IU]/mL (ref 0.450–4.500)

## 2020-09-30 LAB — PREALBUMIN: PREALBUMIN: 23 mg/dL (ref 10–36)

## 2020-10-04 ENCOUNTER — Other Ambulatory Visit (HOSPITAL_COMMUNITY): Payer: Self-pay

## 2020-10-04 ENCOUNTER — Other Ambulatory Visit: Payer: Self-pay | Admitting: Internal Medicine

## 2020-10-04 MED ORDER — LOSARTAN POTASSIUM 50 MG PO TABS
50.0000 mg | ORAL_TABLET | Freq: Every day | ORAL | 2 refills | Status: DC
  Filled 2020-10-04: qty 90, 90d supply, fill #0

## 2020-10-29 ENCOUNTER — Telehealth: Payer: Self-pay

## 2020-10-29 NOTE — Telephone Encounter (Signed)
I returned the pt's call and notified him that I didn't see what he got a call but that he may be an appt reminder for Sept 8th

## 2020-11-04 ENCOUNTER — Ambulatory Visit: Payer: 59 | Admitting: Internal Medicine

## 2020-11-04 ENCOUNTER — Encounter: Payer: Self-pay | Admitting: Internal Medicine

## 2020-11-04 ENCOUNTER — Other Ambulatory Visit: Payer: Self-pay

## 2020-11-04 VITALS — BP 114/70 | HR 64 | Temp 97.6°F | Ht 72.0 in | Wt 174.8 lb

## 2020-11-04 DIAGNOSIS — Z6825 Body mass index (BMI) 25.0-25.9, adult: Secondary | ICD-10-CM | POA: Diagnosis not present

## 2020-11-04 DIAGNOSIS — I2583 Coronary atherosclerosis due to lipid rich plaque: Secondary | ICD-10-CM

## 2020-11-04 DIAGNOSIS — Z23 Encounter for immunization: Secondary | ICD-10-CM | POA: Diagnosis not present

## 2020-11-04 DIAGNOSIS — I2510 Atherosclerotic heart disease of native coronary artery without angina pectoris: Secondary | ICD-10-CM

## 2020-11-04 NOTE — Progress Notes (Signed)
I,[name],acting as a Education administrator for [name], MD.,have documented all relevant documentation on the behalf of [name], MD,as directed by  [name], MD while in the presence of [name], MD.  This visit occurred during the SARS-CoV-2 public health emergency.  Safety protocols were in place, including screening questions prior to the visit, additional usage of staff PPE, and extensive cleaning of exam room while observing appropriate contact time as indicated for disinfecting solutions.  Subjective:     [name] , male    DOB: 02/12/1951 , 70 y.o.   MRN: [id number]   Chief Complaint  Patient presents with   Weight Loss    HPI  He presents today for f/u weight loss.  He states he has introduced greens into his diet. He has not had any issues with this. He has no new concerns.     Past Medical History:  Diagnosis Date   Arthritis    Bullous emphysema (HCC)    Severe Bilateral   Cocaine abuse ([place])    Colitis    with bleeding   COPD (chronic obstructive pulmonary disease) (HCC)    Coronary artery disease    Dyspnea    GERD (gastroesophageal reflux disease)    Headache    sinusitis   Hypertension    LBP (low back pain)    now chronic   Lumbago    Lung mass    Right Upper Lobe pleural based mass, negative  on PET Scan   Neck pain    nerve pain    Pneumonia    hx   S/P angioplasty with stent 01/13/20 emergent DES to mLAD  01/15/2020   Shortness of breath 03/03/2020   Spine pain    nerve   Therapeutic drug monitoring    Tobacco abuse      Family History  Adopted: Yes  Problem Relation Age of Onset   Cancer Father    Lung cancer Father    Heart failure Sister    Breast cancer Sister    Emphysema Mother      Current Outpatient Medications:    acetaminophen (TYLENOL) 325 MG tablet, Take 2 tablets (650 mg total) by mouth every 4 (four) hours as needed for headache or mild pain., Disp: , Rfl:    albuterol  (VENTOLIN HFA) 108 (90 Base) MCG/ACT inhaler, INHALE 2 PUFFS INTO THE LUNGS EVERY 6 (SIX) HOURS AS NEEDED FOR WHEEZING OR SHORTNESS OF BREATH., Disp: 8.5 g, Rfl: 2   aspirin 81 MG chewable tablet, Chew 1 tablet (81 mg total) by mouth daily., Disp: , Rfl:    atorvastatin (LIPITOR) 80 MG tablet, TAKE 1 TABLET (80 MG TOTAL) BY MOUTH DAILY., Disp: 30 tablet, Rfl: 6   Blood Pressure Monitoring (OMRON 3 SERIES BP MONITOR) DEVI, USE AS DIRECTED, Disp: 1 each, Rfl: 0   Budeson-Glycopyrrol-Formoterol (BREZTRI AEROSPHERE) 160-9-4.8 MCG/ACT AERO, Inhale 2 puffs into the lungs 2 (two) times daily., Disp: 10.7 g, Rfl: 2   Carboxymethylcellul-Glycerin (CLEAR EYES FOR DRY EYES OP), Apply 1 drop to eye daily as needed (for dry eyes)., Disp: , Rfl:    clopidogrel (PLAVIX) 75 MG tablet, TAKE 1 TABLET BY MOUTH DAILY. STOP BRILINTA, Disp: 93 tablet, Rfl: 3   cyclobenzaprine (FLEXERIL) 10 MG tablet, TAKE 1 TABLET (10 MG TOTAL) BY MOUTH AS NEEDED FOR MUSCLE SPASMS., Disp: 30 tablet, Rfl: 0   diclofenac Sodium (VOLTAREN) 1 % GEL, Apply 2 g topically 2 (two) times daily.,  Disp: , Rfl:    famotidine (PEPCID) 40 MG tablet, TAKE 1 TABLET BY MOUTH TWICE DAILY., Disp: 60 tablet, Rfl: 12   ipratropium-albuterol (DUONEB) 0.5-2.5 (3) MG/3ML SOLN, Inhale 1 vial via nebulizer 4 times a day, Disp: 270 mL, Rfl: PRN   losartan (COZAAR) 50 MG tablet, Take 1 tablet (50 mg total) by mouth daily., Disp: 90 tablet, Rfl: 2   Magnesium 400 MG CAPS, Take 400 mg by mouth daily., Disp: 30 capsule, Rfl: 1   montelukast (SINGULAIR) 10 MG tablet, TAKE 1 TABLET BY MOUTH EVERY DAY IN THE EVENING, Disp: 30 tablet, Rfl: 3   nicotine (NICODERM CQ - DOSED IN MG/24 HR) 7 mg/24hr patch, PLACE 1 PATCH (7 MG TOTAL) ONTO THE SKIN AT BEDTIME., Disp: 28 patch, Rfl: 0   nitroGLYCERIN (NITROSTAT) 0.4 MG SL tablet, PLACE 1 TABLET (0.4 MG TOTAL) UNDER THE TONGUE EVERY 5 (FIVE) MINUTES AS NEEDED FOR CHEST PAIN., Disp: 25 tablet, Rfl: 4   pantoprazole (PROTONIX) 40 MG  tablet, TAKE 1 TABLET (40 MG TOTAL) BY MOUTH DAILY.STOP OMEPRAZOLE (Patient taking differently: Take 40 mg by mouth 2 (two) times daily. Patient says he is taking twice a day), Disp: 90 tablet, Rfl: 3   Respiratory Therapy Supplies (FLUTTER) DEVI, Use as directed, Disp: 1 each, Rfl: 0   tamsulosin (FLOMAX) 0.4 MG CAPS capsule, Take 1 capsule (0.4 mg total) by mouth daily., Disp: 90 capsule, Rfl: 2   metoprolol tartrate (LOPRESSOR) 25 MG tablet, Take 0.5 tablets (12.5 mg total) by mouth 2 (two) times daily., Disp: 90 tablet, Rfl: 2   pregabalin (LYRICA) 50 MG capsule, TAKE 1 CAPSULE BY MOUTH TWICE DAILY (Patient taking differently: Take by mouth daily.), Disp: 60 capsule, Rfl: 3   Allergies  Allergen Reactions   Penicillins Anaphylaxis and Hives    Has patient had a PCN reaction causing immediate rash, facial/tongue/throat swelling, SOB or lightheadedness with hypotension: No Has patient had a PCN reaction causing severe rash involving mucus membranes or skin necrosis: No Has patient had a PCN reaction that required hospitalization No Has patient had a PCN reaction occurring within the last 10 years: No If all of the above answers are "NO", then may proceed with Cephalosporin use.       Review of Systems  Constitutional: Negative.   Respiratory: Negative.    Cardiovascular: Negative.   Gastrointestinal: Negative.   Psychiatric/Behavioral: Negative.    All other systems reviewed and are negative.   Today's Vitals   11/04/20 1030  BP: 114/70  Pulse: 64  Temp: 97.6 F (36.4 C)  TempSrc: Oral  Weight: 174 lb 12.8 oz (79.3 kg)  Height: 6' (1.829 m)  PainSc: 0-No pain   Body mass index is 23.71 kg/m.  Wt Readings from Last 3 Encounters:  11/04/20 174 lb 12.8 oz (79.3 kg)  09/29/20 171 lb (77.6 kg)  09/11/20 175 lb (79.4 kg)    BP Readings from Last 3 Encounters:  11/04/20 114/70  09/29/20 118/70  09/11/20 125/74    Objective:  Physical Exam Vitals and nursing note  reviewed.  Constitutional:      Appearance: Normal appearance.  HENT:     Head: Normocephalic and atraumatic.     Nose:     Comments: Masked     Mouth/Throat:     Comments: Masked  Eyes:     Extraocular Movements: Extraocular movements intact.  Cardiovascular:     Rate and Rhythm: Normal rate and regular rhythm.     Heart sounds: Normal  heart sounds.  Pulmonary:     Effort: Pulmonary effort is normal.     Breath sounds: Normal breath sounds.  Musculoskeletal:     Cervical back: Normal range of motion.  Skin:    General: Skin is warm.  Neurological:     General: No focal deficit present.     Mental Status: He is alert.  Psychiatric:        Mood and Affect: Mood normal.        Assessment And Plan:     1. Body mass index (BMI) 25.0-25.9, adult Comments: Pt has gained 3 pounds since his last visit. No cause for concern. He is encouraged to aim for at least 150 minutes of exercise per week.   2. Coronary atherosclerosis due to lipid rich plaque Comments: He is not experiencing anginal sx at this time. He was commended for his positive lifestyle changes.   3. Immunization due Comments: He was given flu vaccine. - Flu Vaccine QUAD High Dose(Fluad)   Patient was given opportunity to ask questions. Patient verbalized understanding of the plan and was able to repeat key elements of the plan. All questions were answered to their satisfaction.   I, [name], MD, have reviewed all documentation for this visit. The documentation on 11/21/20 for the exam, diagnosis, procedures, and orders are all accurate and complete.   IF YOU HAVE BEEN REFERRED TO A SPECIALIST, IT MAY TAKE 1-2 WEEKS TO SCHEDULE/PROCESS THE REFERRAL. IF YOU HAVE NOT HEARD FROM US/SPECIALIST IN TWO WEEKS, PLEASE GIVE [place] A CALL AT [phone].   THE PATIENT IS ENCOURAGED TO PRACTICE SOCIAL DISTANCING DUE TO THE COVID-19 PANDEMIC.

## 2020-11-04 NOTE — Progress Notes (Signed)
I,Phelix Fudala T Ryver Zadrozny,acting as a scribe for [name], MD.,have documented all relevant documentation on the behalf of [name], MD,as directed by  [name], MD while in the presence of [name], MD.  This visit occurred during the SARS-CoV-2 public health emergency.  Safety protocols were in place, including screening questions prior to the visit, additional usage of staff PPE, and extensive cleaning of exam room while observing appropriate contact time as indicated for disinfecting solutions.  Subjective:     [name] , male    DOB: 03-30-1950 , 70 y.o.   MRN: [id number]   Chief Complaint  Patient presents with   Obesity    HPI  Pt presents today for weight check.    Past Medical History:  Diagnosis Date   Arthritis    Bullous emphysema (HCC)    Severe Bilateral   Cocaine abuse ([place])    Colitis    with bleeding   COPD (chronic obstructive pulmonary disease) (HCC)    Coronary artery disease    Dyspnea    GERD (gastroesophageal reflux disease)    Headache    sinusitis   Hypertension    LBP (low back pain)    now chronic   Lumbago    Lung mass    Right Upper Lobe pleural based mass, negative  on PET Scan   Neck pain    nerve pain    Pneumonia    hx   S/P angioplasty with stent 01/13/20 emergent DES to mLAD  01/15/2020   Shortness of breath 03/03/2020   Spine pain    nerve   Therapeutic drug monitoring    Tobacco abuse      Family History  Adopted: Yes  Problem Relation Age of Onset   Cancer Father    Lung cancer Father    Heart failure Sister    Breast cancer Sister    Emphysema Mother      Current Outpatient Medications:    acetaminophen (TYLENOL) 325 MG tablet, Take 2 tablets (650 mg total) by mouth every 4 (four) hours as needed for headache or mild pain., Disp: , Rfl:    albuterol (VENTOLIN HFA) 108 (90 Base) MCG/ACT inhaler, INHALE 2 PUFFS INTO THE LUNGS EVERY 6 (SIX) HOURS AS NEEDED FOR WHEEZING OR  SHORTNESS OF BREATH., Disp: 8.5 g, Rfl: 2   aspirin 81 MG chewable tablet, Chew 1 tablet (81 mg total) by mouth daily., Disp: , Rfl:    atorvastatin (LIPITOR) 80 MG tablet, TAKE 1 TABLET (80 MG TOTAL) BY MOUTH DAILY., Disp: 30 tablet, Rfl: 6   Blood Pressure Monitoring (OMRON 3 SERIES BP MONITOR) DEVI, USE AS DIRECTED, Disp: 1 each, Rfl: 0   Budeson-Glycopyrrol-Formoterol (BREZTRI AEROSPHERE) 160-9-4.8 MCG/ACT AERO, Inhale 2 puffs into the lungs 2 (two) times daily., Disp: 10.7 g, Rfl: 2   Carboxymethylcellul-Glycerin (CLEAR EYES FOR DRY EYES OP), Apply 1 drop to eye daily as needed (for dry eyes)., Disp: , Rfl:    ciprofloxacin (CIPRO) 500 MG tablet, Take 1 tablet (500 mg total) by mouth 2 (two) times daily., Disp: 14 tablet, Rfl: 0   clopidogrel (PLAVIX) 75 MG tablet, TAKE 1 TABLET BY MOUTH DAILY. STOP BRILINTA, Disp: 93 tablet, Rfl: 3   cyclobenzaprine (FLEXERIL) 10 MG tablet, TAKE 1 TABLET (10 MG TOTAL) BY MOUTH AS NEEDED FOR MUSCLE SPASMS., Disp: 30 tablet, Rfl: 0   diclofenac Sodium (VOLTAREN) 1 % GEL, Apply 2 g topically 2 (two) times daily., Disp: ,  Rfl:    famotidine (PEPCID) 40 MG tablet, TAKE 1 TABLET BY MOUTH TWICE DAILY., Disp: 60 tablet, Rfl: 12   ipratropium-albuterol (DUONEB) 0.5-2.5 (3) MG/3ML SOLN, Inhale 1 vial via nebulizer 4 times a day, Disp: 270 mL, Rfl: PRN   losartan (COZAAR) 50 MG tablet, Take 1 tablet (50 mg total) by mouth daily., Disp: 90 tablet, Rfl: 2   Magnesium 400 MG CAPS, Take 400 mg by mouth daily., Disp: 30 capsule, Rfl: 1   metoprolol tartrate (LOPRESSOR) 25 MG tablet, Take 0.5 tablets (12.5 mg total) by mouth 2 (two) times daily., Disp: 90 tablet, Rfl: 2   metroNIDAZOLE (FLAGYL) 500 MG tablet, Take 1 tablet (500 mg total) by mouth 4 (four) times daily., Disp: 28 tablet, Rfl: 0   montelukast (SINGULAIR) 10 MG tablet, TAKE 1 TABLET BY MOUTH EVERY DAY IN THE EVENING, Disp: 30 tablet, Rfl: 3   nicotine (NICODERM CQ - DOSED IN MG/24 HR) 7 mg/24hr patch, PLACE 1  PATCH (7 MG TOTAL) ONTO THE SKIN AT BEDTIME., Disp: 28 patch, Rfl: 0   nitroGLYCERIN (NITROSTAT) 0.4 MG SL tablet, PLACE 1 TABLET (0.4 MG TOTAL) UNDER THE TONGUE EVERY 5 (FIVE) MINUTES AS NEEDED FOR CHEST PAIN., Disp: 25 tablet, Rfl: 4   pantoprazole (PROTONIX) 40 MG tablet, TAKE 1 TABLET (40 MG TOTAL) BY MOUTH DAILY.STOP OMEPRAZOLE (Patient taking differently: Take 40 mg by mouth 2 (two) times daily. Patient says he is taking twice a day), Disp: 90 tablet, Rfl: 3   predniSONE (DELTASONE) 10 MG tablet, TAKE 6 TABS BY MOUTH ON DAY 1; 5 TABS ON DAY 2; 4 TABS ON DAY 3; 3 TABS ON DAY 4; 2 TABS ON DAY 5; 1 TAB ON DAY 6 THEN STOP, Disp: 21 tablet, Rfl: 0   pregabalin (LYRICA) 50 MG capsule, TAKE 1 CAPSULE BY MOUTH TWICE DAILY (Patient taking differently: Take by mouth daily.), Disp: 60 capsule, Rfl: 3   Respiratory Therapy Supplies (FLUTTER) DEVI, Use as directed, Disp: 1 each, Rfl: 0   tamsulosin (FLOMAX) 0.4 MG CAPS capsule, Take 1 capsule (0.4 mg total) by mouth daily., Disp: 90 capsule, Rfl: 2   Allergies  Allergen Reactions   Penicillins Anaphylaxis and Hives    Has patient had a PCN reaction causing immediate rash, facial/tongue/throat swelling, SOB or lightheadedness with hypotension: No Has patient had a PCN reaction causing severe rash involving mucus membranes or skin necrosis: No Has patient had a PCN reaction that required hospitalization No Has patient had a PCN reaction occurring within the last 10 years: No If all of the above answers are "NO", then may proceed with Cephalosporin use.       Review of Systems  Constitutional: Negative.   HENT: Negative.    Gastrointestinal: Negative.   Genitourinary: Negative.   Skin: Negative.   Allergic/Immunologic: Negative.   Neurological: Negative.   Hematological: Negative.   Psychiatric/Behavioral: Negative.      There were no vitals filed for this visit. There is no height or weight on file to calculate BMI.   Objective:  Physical  Exam      Assessment And Plan:     1. Adult BMI 25.0-25.9 kg/sq m    Patient was given opportunity to ask questions. Patient verbalized understanding of the plan and was able to repeat key elements of the plan. All questions were answered to their satisfaction.  Debbora Dus, CMA   I, Debbora Dus, CMA, have reviewed all documentation for this visit. The documentation on 11/04/20  for the exam, diagnosis, procedures, and orders are all accurate and complete.   IF YOU HAVE BEEN REFERRED TO A SPECIALIST, IT MAY TAKE 1-2 WEEKS TO SCHEDULE/PROCESS THE REFERRAL. IF YOU HAVE NOT HEARD FROM US/SPECIALIST IN TWO WEEKS, PLEASE GIVE [place] A CALL AT [phone].   THE PATIENT IS ENCOURAGED TO PRACTICE SOCIAL DISTANCING DUE TO THE COVID-19 PANDEMIC.

## 2020-11-04 NOTE — Patient Instructions (Signed)

## 2020-11-08 ENCOUNTER — Other Ambulatory Visit (INDEPENDENT_AMBULATORY_CARE_PROVIDER_SITE_OTHER): Payer: 59

## 2020-11-08 DIAGNOSIS — R634 Abnormal weight loss: Secondary | ICD-10-CM

## 2020-11-08 LAB — HEMOCCULT GUIAC POC 1CARD (OFFICE)
Card #2 Fecal Occult Blod, POC: NEGATIVE
Card #3 Fecal Occult Blood, POC: NEGATIVE
Fecal Occult Blood, POC: NEGATIVE

## 2020-11-17 ENCOUNTER — Other Ambulatory Visit (HOSPITAL_COMMUNITY): Payer: Self-pay

## 2020-11-19 ENCOUNTER — Other Ambulatory Visit (HOSPITAL_COMMUNITY): Payer: Self-pay

## 2020-11-19 ENCOUNTER — Other Ambulatory Visit: Payer: Self-pay | Admitting: Internal Medicine

## 2020-11-23 ENCOUNTER — Other Ambulatory Visit (HOSPITAL_COMMUNITY): Payer: Self-pay

## 2020-11-23 ENCOUNTER — Other Ambulatory Visit: Payer: Self-pay

## 2020-11-24 ENCOUNTER — Other Ambulatory Visit (HOSPITAL_COMMUNITY): Payer: Self-pay

## 2020-11-24 ENCOUNTER — Other Ambulatory Visit: Payer: Self-pay | Admitting: Internal Medicine

## 2020-11-24 MED ORDER — PREGABALIN 50 MG PO CAPS
50.0000 mg | ORAL_CAPSULE | Freq: Two times a day (BID) | ORAL | 1 refills | Status: DC
  Filled 2020-11-24: qty 180, 90d supply, fill #0
  Filled 2021-08-31: qty 180, 90d supply, fill #1

## 2020-11-25 NOTE — Telephone Encounter (Signed)
Patient was seen by Denyse Amass NP in June 2022

## 2020-11-26 ENCOUNTER — Other Ambulatory Visit (HOSPITAL_COMMUNITY): Payer: Self-pay

## 2020-11-26 MED FILL — Famotidine Tab 40 MG: ORAL | 30 days supply | Qty: 60 | Fill #1 | Status: AC

## 2020-11-26 MED FILL — Clopidogrel Bisulfate Tab 75 MG (Base Equiv): ORAL | 90 days supply | Qty: 90 | Fill #1 | Status: AC

## 2020-12-08 ENCOUNTER — Encounter: Payer: 59 | Attending: Internal Medicine | Admitting: Dietician

## 2020-12-11 ENCOUNTER — Encounter (HOSPITAL_COMMUNITY): Payer: Self-pay | Admitting: Emergency Medicine

## 2020-12-11 ENCOUNTER — Observation Stay (HOSPITAL_COMMUNITY)
Admission: EM | Admit: 2020-12-11 | Discharge: 2020-12-13 | Disposition: A | Discharge status: Home / Self Care | Payer: 59 | Attending: Internal Medicine | Admitting: Internal Medicine

## 2020-12-11 ENCOUNTER — Other Ambulatory Visit: Payer: Self-pay

## 2020-12-11 ENCOUNTER — Emergency Department (HOSPITAL_COMMUNITY): Payer: 59

## 2020-12-11 DIAGNOSIS — F17210 Nicotine dependence, cigarettes, uncomplicated: Secondary | ICD-10-CM | POA: Diagnosis not present

## 2020-12-11 DIAGNOSIS — Z720 Tobacco use: Secondary | ICD-10-CM | POA: Diagnosis present

## 2020-12-11 DIAGNOSIS — I10 Essential (primary) hypertension: Secondary | ICD-10-CM | POA: Diagnosis present

## 2020-12-11 DIAGNOSIS — E785 Hyperlipidemia, unspecified: Secondary | ICD-10-CM | POA: Diagnosis present

## 2020-12-11 DIAGNOSIS — J4489 Other specified chronic obstructive pulmonary disease: Secondary | ICD-10-CM | POA: Diagnosis present

## 2020-12-11 DIAGNOSIS — Z20822 Contact with and (suspected) exposure to covid-19: Secondary | ICD-10-CM | POA: Diagnosis not present

## 2020-12-11 DIAGNOSIS — J9811 Atelectasis: Secondary | ICD-10-CM | POA: Diagnosis not present

## 2020-12-11 DIAGNOSIS — E782 Mixed hyperlipidemia: Secondary | ICD-10-CM | POA: Diagnosis not present

## 2020-12-11 DIAGNOSIS — J432 Centrilobular emphysema: Secondary | ICD-10-CM | POA: Diagnosis not present

## 2020-12-11 DIAGNOSIS — J449 Chronic obstructive pulmonary disease, unspecified: Secondary | ICD-10-CM | POA: Diagnosis not present

## 2020-12-11 DIAGNOSIS — R0789 Other chest pain: Secondary | ICD-10-CM

## 2020-12-11 DIAGNOSIS — K5792 Diverticulitis of intestine, part unspecified, without perforation or abscess without bleeding: Secondary | ICD-10-CM | POA: Diagnosis not present

## 2020-12-11 DIAGNOSIS — Z7982 Long term (current) use of aspirin: Secondary | ICD-10-CM | POA: Diagnosis not present

## 2020-12-11 DIAGNOSIS — I2510 Atherosclerotic heart disease of native coronary artery without angina pectoris: Principal | ICD-10-CM | POA: Diagnosis present

## 2020-12-11 DIAGNOSIS — R072 Precordial pain: Secondary | ICD-10-CM | POA: Diagnosis not present

## 2020-12-11 DIAGNOSIS — R079 Chest pain, unspecified: Secondary | ICD-10-CM | POA: Diagnosis not present

## 2020-12-11 DIAGNOSIS — H5213 Myopia, bilateral: Secondary | ICD-10-CM | POA: Diagnosis not present

## 2020-12-11 DIAGNOSIS — Z79899 Other long term (current) drug therapy: Secondary | ICD-10-CM | POA: Diagnosis not present

## 2020-12-11 DIAGNOSIS — N401 Enlarged prostate with lower urinary tract symptoms: Secondary | ICD-10-CM | POA: Diagnosis present

## 2020-12-11 DIAGNOSIS — R0602 Shortness of breath: Secondary | ICD-10-CM | POA: Diagnosis not present

## 2020-12-11 LAB — CBC WITH DIFFERENTIAL/PLATELET
Abs Immature Granulocytes: 0.02 10*3/uL (ref 0.00–0.07)
Basophils Absolute: 0 10*3/uL (ref 0.0–0.1)
Basophils Relative: 0 %
Eosinophils Absolute: 0.2 10*3/uL (ref 0.0–0.5)
Eosinophils Relative: 3 %
HCT: 48.2 % (ref 39.0–52.0)
Hemoglobin: 15.3 g/dL (ref 13.0–17.0)
Immature Granulocytes: 0 %
Lymphocytes Relative: 49 %
Lymphs Abs: 3.5 10*3/uL (ref 0.7–4.0)
MCH: 28.4 pg (ref 26.0–34.0)
MCHC: 31.7 g/dL (ref 30.0–36.0)
MCV: 89.4 fL (ref 80.0–100.0)
Monocytes Absolute: 0.4 10*3/uL (ref 0.1–1.0)
Monocytes Relative: 5 %
Neutro Abs: 3.1 10*3/uL (ref 1.7–7.7)
Neutrophils Relative %: 43 %
Platelets: 196 10*3/uL (ref 150–400)
RBC: 5.39 MIL/uL (ref 4.22–5.81)
RDW: 15.2 % (ref 11.5–15.5)
WBC: 7.2 10*3/uL (ref 4.0–10.5)
nRBC: 0 % (ref 0.0–0.2)

## 2020-12-11 LAB — COMPREHENSIVE METABOLIC PANEL
ALT: 26 U/L (ref 0–44)
AST: 23 U/L (ref 15–41)
Albumin: 3.5 g/dL (ref 3.5–5.0)
Alkaline Phosphatase: 92 U/L (ref 38–126)
Anion gap: 10 (ref 5–15)
BUN: 14 mg/dL (ref 8–23)
CO2: 23 mmol/L (ref 22–32)
Calcium: 9.4 mg/dL (ref 8.9–10.3)
Chloride: 104 mmol/L (ref 98–111)
Creatinine, Ser: 1.27 mg/dL — ABNORMAL HIGH (ref 0.61–1.24)
GFR, Estimated: >60 mL/min (ref >60)
Glucose, Bld: 154 mg/dL — ABNORMAL HIGH (ref 70–99)
Potassium: 3.6 mmol/L (ref 3.5–5.1)
Sodium: 137 mmol/L (ref 135–145)
Total Bilirubin: 0.7 mg/dL (ref 0.3–1.2)
Total Protein: 7.1 g/dL (ref 6.5–8.1)

## 2020-12-11 LAB — RESP PANEL BY RT-PCR (FLU A&B, COVID) ARPGX2
Influenza A by PCR: NEGATIVE
Influenza B by PCR: NEGATIVE
SARS Coronavirus 2 by RT PCR: NEGATIVE

## 2020-12-11 LAB — TROPONIN I (HIGH SENSITIVITY)
Troponin I (High Sensitivity): 9 ng/L (ref <18)
Troponin I (High Sensitivity): 8 ng/L (ref <18)
Troponin I (High Sensitivity): 9 ng/L (ref <18)

## 2020-12-11 MED ORDER — ACETAMINOPHEN 325 MG PO TABS: 650.0000 mg | ORAL_TABLET | ORAL | Status: DC | PRN

## 2020-12-11 MED ORDER — CLOPIDOGREL BISULFATE 75 MG PO TABS
75.0000 mg | ORAL_TABLET | Freq: Every day | ORAL | Status: DC
  Administered 2020-12-12 – 2020-12-13 (×2): 75 mg via ORAL
  Filled 2020-12-11 (×2): qty 1

## 2020-12-11 MED ORDER — SODIUM CHLORIDE 0.9 % IV SOLN: 250.0000 mL | INTRAVENOUS | Status: DC | PRN

## 2020-12-11 MED ORDER — SODIUM CHLORIDE 0.9% FLUSH
3.0000 mL | Freq: Two times a day (BID) | INTRAVENOUS | Status: DC
  Administered 2020-12-12: 3 mL via INTRAVENOUS

## 2020-12-11 MED ORDER — SODIUM CHLORIDE 0.9% FLUSH
3.0000 mL | INTRAVENOUS | Status: DC | PRN
Start: 2020-12-11 — End: 2020-12-13

## 2020-12-11 MED ORDER — ONDANSETRON HCL 4 MG/2ML IJ SOLN: 4.0000 mg | Freq: Four times a day (QID) | INTRAMUSCULAR | Status: DC | PRN

## 2020-12-11 MED ORDER — ALBUTEROL SULFATE (2.5 MG/3ML) 0.083% IN NEBU: 2.5000 mg | INHALATION_SOLUTION | Freq: Four times a day (QID) | RESPIRATORY_TRACT | Status: DC | PRN

## 2020-12-11 MED ORDER — LOSARTAN POTASSIUM 50 MG PO TABS
50.0000 mg | ORAL_TABLET | Freq: Every day | ORAL | Status: DC
  Administered 2020-12-12 – 2020-12-13 (×2): 50 mg via ORAL
  Filled 2020-12-11 (×2): qty 1

## 2020-12-11 MED ORDER — SODIUM CHLORIDE 0.9% FLUSH
3.0000 mL | Freq: Two times a day (BID) | INTRAVENOUS | Status: DC
  Administered 2020-12-11 – 2020-12-13 (×4): 3 mL via INTRAVENOUS

## 2020-12-11 MED ORDER — HEPARIN SODIUM (PORCINE) 5000 UNIT/ML IJ SOLN
5000.0000 [IU] | Freq: Three times a day (TID) | INTRAMUSCULAR | Status: DC
  Administered 2020-12-12 – 2020-12-13 (×5): 5000 [IU] via SUBCUTANEOUS
  Filled 2020-12-11 (×5): qty 1

## 2020-12-11 MED ORDER — IPRATROPIUM-ALBUTEROL 0.5-2.5 (3) MG/3ML IN SOLN: 3.0000 mL | Freq: Four times a day (QID) | RESPIRATORY_TRACT | Status: DC | PRN

## 2020-12-11 MED ORDER — CIPROFLOXACIN HCL 500 MG PO TABS
500.0000 mg | ORAL_TABLET | Freq: Two times a day (BID) | ORAL | Status: AC
  Administered 2020-12-11 – 2020-12-13 (×4): 500 mg via ORAL
  Filled 2020-12-11 (×4): qty 1

## 2020-12-11 MED ORDER — FLUTICASONE FUROATE-VILANTEROL 100-25 MCG/INH IN AEPB
1.0000 | INHALATION_SPRAY | Freq: Every day | RESPIRATORY_TRACT | Status: DC
  Administered 2020-12-12 – 2020-12-13 (×2): 1 via RESPIRATORY_TRACT
  Filled 2020-12-11: qty 28

## 2020-12-11 MED ORDER — POLYVINYL ALCOHOL 1.4 % OP SOLN
1.0000 [drp] | Freq: Every day | OPHTHALMIC | Status: DC | PRN
  Filled 2020-12-11: qty 15

## 2020-12-11 MED ORDER — SODIUM CHLORIDE 0.9% FLUSH: 3.0000 mL | INTRAVENOUS | Status: DC | PRN

## 2020-12-11 MED ORDER — SODIUM CHLORIDE 0.9 % WEIGHT BASED INFUSION: 1.0000 mL/kg/h | INTRAVENOUS | Status: DC

## 2020-12-11 MED ORDER — UMECLIDINIUM BROMIDE 62.5 MCG/INH IN AEPB
1.0000 | INHALATION_SPRAY | Freq: Every day | RESPIRATORY_TRACT | Status: DC
  Administered 2020-12-12 – 2020-12-13 (×2): 1 via RESPIRATORY_TRACT
  Filled 2020-12-11: qty 7

## 2020-12-11 MED ORDER — PREGABALIN 25 MG PO CAPS
50.0000 mg | ORAL_CAPSULE | Freq: Two times a day (BID) | ORAL | Status: DC
  Administered 2020-12-11 – 2020-12-13 (×4): 50 mg via ORAL
  Filled 2020-12-11 (×4): qty 2

## 2020-12-11 MED ORDER — NICOTINE 14 MG/24HR TD PT24
14.0000 mg | MEDICATED_PATCH | Freq: Every day | TRANSDERMAL | Status: DC
  Administered 2020-12-12: 14 mg via TRANSDERMAL
  Filled 2020-12-11: qty 1

## 2020-12-11 MED ORDER — METRONIDAZOLE 500 MG PO TABS
500.0000 mg | ORAL_TABLET | Freq: Three times a day (TID) | ORAL | Status: AC
  Administered 2020-12-11 – 2020-12-13 (×6): 500 mg via ORAL
  Filled 2020-12-11 (×6): qty 1

## 2020-12-11 MED ORDER — PANTOPRAZOLE SODIUM 40 MG PO TBEC
40.0000 mg | DELAYED_RELEASE_TABLET | Freq: Every day | ORAL | Status: DC
  Administered 2020-12-12 – 2020-12-13 (×2): 40 mg via ORAL
  Filled 2020-12-11 (×2): qty 1

## 2020-12-11 MED ORDER — ASPIRIN 81 MG PO CHEW
81.0000 mg | CHEWABLE_TABLET | Freq: Every day | ORAL | Status: AC
  Administered 2020-12-12: 81 mg via ORAL
  Filled 2020-12-11: qty 1

## 2020-12-11 MED ORDER — FAMOTIDINE 20 MG PO TABS
40.0000 mg | ORAL_TABLET | Freq: Every day | ORAL | Status: DC
  Administered 2020-12-12 – 2020-12-13 (×2): 40 mg via ORAL
  Filled 2020-12-11 (×2): qty 2

## 2020-12-11 MED ORDER — MORPHINE SULFATE (PF) 2 MG/ML IV SOLN: 1.0000 mg | INTRAVENOUS | Status: DC | PRN

## 2020-12-11 MED ORDER — ASPIRIN 81 MG PO CHEW: 81.0000 mg | CHEWABLE_TABLET | ORAL | Status: DC

## 2020-12-11 MED ORDER — MONTELUKAST SODIUM 10 MG PO TABS
10.0000 mg | ORAL_TABLET | Freq: Every day | ORAL | Status: DC | PRN
  Filled 2020-12-11: qty 1

## 2020-12-11 MED ORDER — NITROGLYCERIN 0.4 MG SL SUBL
0.4000 mg | SUBLINGUAL_TABLET | SUBLINGUAL | Status: DC | PRN
  Administered 2020-12-11: 0.4 mg via SUBLINGUAL
  Filled 2020-12-11 (×2): qty 1

## 2020-12-11 MED ORDER — METOPROLOL TARTRATE 12.5 MG HALF TABLET
12.5000 mg | ORAL_TABLET | Freq: Two times a day (BID) | ORAL | Status: DC
  Administered 2020-12-11 – 2020-12-13 (×4): 12.5 mg via ORAL
  Filled 2020-12-11 (×4): qty 1

## 2020-12-11 MED ORDER — TAMSULOSIN HCL 0.4 MG PO CAPS
0.4000 mg | ORAL_CAPSULE | Freq: Every day | ORAL | Status: DC
  Administered 2020-12-12 – 2020-12-13 (×2): 0.4 mg via ORAL
  Filled 2020-12-11 (×2): qty 1

## 2020-12-11 MED ORDER — ATORVASTATIN CALCIUM 80 MG PO TABS
80.0000 mg | ORAL_TABLET | Freq: Every day | ORAL | Status: DC
  Administered 2020-12-12 – 2020-12-13 (×2): 80 mg via ORAL
  Filled 2020-12-11 (×2): qty 1

## 2020-12-11 MED ORDER — BUDESON-GLYCOPYRROL-FORMOTEROL 160-9-4.8 MCG/ACT IN AERO: 2.0000 | INHALATION_SPRAY | Freq: Two times a day (BID) | RESPIRATORY_TRACT | Status: DC

## 2020-12-11 MED ORDER — SODIUM CHLORIDE 0.9 % WEIGHT BASED INFUSION: 3.0000 mL/kg/h | INTRAVENOUS | Status: DC

## 2020-12-11 NOTE — ED Notes (Signed)
Family came out of room stating the pts chest pain was still there. Pt and family made aware that the care has been started a EDP will be in shortly. Family upset and states that staff "needs to do more than talk".

## 2020-12-11 NOTE — ED Triage Notes (Signed)
Pt to triage via [facility] from home.  Sudden onset of L upper back pain while talking to a friend.  Pain progressed to L chest.  Denies SOB, nausea, and vomiting.  Took 2 home NTG.  20g LAC, ASA 324mg , and 1 NTG by EMS.  Pain is intermittent and he believes it gets worse when talking.

## 2020-12-11 NOTE — Consult Note (Signed)
CARDIOLOGY CONSULT NOTE       Patient ID: NGHIA MCENTEE MRN: [id number] DOB/AGE: [birth year] 70 y.o.  Admit date: 12/11/2020 Referring Physician: Tegler Primary Physician: Glendale Chard, MD Primary Cardiologist: Oval Linsey Reason for Consultation: Chest Pain  Active Problems:   * No active hospital problems. *   HPI:  70 y.o. with history of CAD. LAD stent by Dr Burt Knack 01/14/20.  Still smoking with significant COPD. Had normal ETT 05/04/20 in f/u. Last few days has had atypical sharp sudden onset pain in back chest and left arm. Pain not always exertional However pains similar to his previous angina that resulted in STEMI while in ER with anterior ST elevation EF has been preserved. Today had SSCP and took 2 nitro with partial relief and had diaphoresis. Pain free now. Diagnosed with diverticulitis 09/11/20 after eating rice with some rectal bleeding. Rx antibiotics Sees Dr Collene Mares GI. Diagnosed with recurrence after eating rice and has been on antibiotics again for the last 6 days. Abdominal pain improved with mild diarrhea and no bleeding. Compliant with meds including DAT. Still smoking < 1 ppd  He is from [place] but been here 25 years. Wife works in Sales executive at [employer]  He is retired use to work at [employer] with [employer]. Is interested in [place] All other systems reviewed and negative except as noted above  Past Medical History:  Diagnosis Date   Arthritis    Bullous emphysema (HCC)    Severe Bilateral   Cocaine abuse ([place])    Colitis    with bleeding   COPD (chronic obstructive pulmonary disease) (HCC)    Coronary artery disease    Dyspnea    GERD (gastroesophageal reflux disease)    Headache    sinusitis   Hypertension    LBP (low back pain)    now chronic   Lumbago    Lung mass    Right Upper Lobe pleural based mass, negative  on PET Scan   Neck pain    nerve pain    Pneumonia    hx   S/P angioplasty  with stent 01/13/20 emergent DES to mLAD  01/15/2020   Shortness of breath 03/03/2020   Spine pain    nerve   Therapeutic drug monitoring    Tobacco abuse     Family History  Adopted: Yes  Problem Relation Age of Onset   Cancer Father    Lung cancer Father    Heart failure Sister    Breast cancer Sister    Emphysema Mother     Social History   Socioeconomic History   Marital status: Married    Spouse name: Not on file   Number of children: 4   Years of education: Not on file   Highest education level: Some college, no degree  Occupational History   Occupation: Merchandiser, retail: UNEMPLOYED    Comment: Previous floor maintenance   Tobacco Use   Smoking status: Every Day    Packs/day: 0.50    Years: 45.00    Pack years: 22.50    Types: Cigarettes   Smokeless tobacco: Never   Tobacco comments:    04/22/20 Patient given 1-800-quit-now  and the link to [facility] virtual smoking cessation link  Vaping Use   Vaping Use: Never used  Substance and Sexual Activity   Alcohol use: Yes    Comment: scotch once a week   Drug use: Yes  Types: Cocaine, Marijuana    Comment: crack cocaine quit 2003   Sexual activity: Not on file  Other Topics Concern   Not on file  Social History Narrative   Not on file   Social Determinants of Health   Financial Resource Strain: Not on file  Food Insecurity: Not on file  Transportation Needs: Not on file  Physical Activity: Not on file  Stress: Not on file  Social Connections: Not on file  Intimate Partner Violence: Not on file    Past Surgical History:  Procedure Laterality Date   CARDIAC CATHETERIZATION     COLONOSCOPY     CORONARY/GRAFT ACUTE MI REVASCULARIZATION N/A 01/14/2020   Procedure: Coronary/Graft Acute MI Revascularization;  Surgeon: Sherren Mocha, MD;  Location: [facility];  Service: Cardiovascular;  Laterality: N/A;   LEFT HEART CATH AND CORONARY ANGIOGRAPHY N/A 01/14/2020   Procedure: LEFT HEART CATH  AND CORONARY ANGIOGRAPHY;  Surgeon: Sherren Mocha, MD;  Location: [facility];  Service: Cardiovascular;  Laterality: N/A;   LUMBAR LAMINECTOMY/DECOMPRESSION MICRODISCECTOMY Right 05/03/2016   Procedure: MICRODISCECTOMY LUMBAR FIVE- SACRAL ONE RIGHT;  Surgeon: Ashok Pall, MD;  Location: [facility];  Service: Neurosurgery;  Laterality: Right;   NO PAST SURGERIES       No current facility-administered medications for this encounter.  Current Outpatient Medications:    acetaminophen (TYLENOL) 325 MG tablet, Take 2 tablets (650 mg total) by mouth every 4 (four) hours as needed for headache or mild pain., Disp: , Rfl:    albuterol (VENTOLIN HFA) 108 (90 Base) MCG/ACT inhaler, INHALE 2 PUFFS INTO THE LUNGS EVERY 6 (SIX) HOURS AS NEEDED FOR WHEEZING OR SHORTNESS OF BREATH., Disp: 8.5 g, Rfl: 2   aspirin 81 MG chewable tablet, Chew 1 tablet (81 mg total) by mouth daily., Disp: , Rfl:    atorvastatin (LIPITOR) 80 MG tablet, TAKE 1 TABLET (80 MG TOTAL) BY MOUTH DAILY., Disp: 30 tablet, Rfl: 6   Blood Pressure Monitoring (OMRON 3 SERIES BP MONITOR) DEVI, USE AS DIRECTED, Disp: 1 each, Rfl: 0   Budeson-Glycopyrrol-Formoterol (BREZTRI AEROSPHERE) 160-9-4.8 MCG/ACT AERO, Inhale 2 puffs into the lungs 2 (two) times daily., Disp: 10.7 g, Rfl: 2   Carboxymethylcellul-Glycerin (CLEAR EYES FOR DRY EYES OP), Apply 1 drop to eye daily as needed (for dry eyes)., Disp: , Rfl:    clopidogrel (PLAVIX) 75 MG tablet, TAKE 1 TABLET BY MOUTH DAILY. STOP BRILINTA, Disp: 93 tablet, Rfl: 3   cyclobenzaprine (FLEXERIL) 10 MG tablet, TAKE 1 TABLET (10 MG TOTAL) BY MOUTH AS NEEDED FOR MUSCLE SPASMS., Disp: 30 tablet, Rfl: 0   diclofenac Sodium (VOLTAREN) 1 % GEL, Apply 2 g topically 2 (two) times daily., Disp: , Rfl:    famotidine (PEPCID) 40 MG tablet, TAKE 1 TABLET BY MOUTH TWICE DAILY., Disp: 60 tablet, Rfl: 12   ipratropium-albuterol (DUONEB) 0.5-2.5 (3) MG/3ML SOLN, Inhale 1 vial via nebulizer 4 times a day, Disp: 270 mL,  Rfl: PRN   losartan (COZAAR) 50 MG tablet, Take 1 tablet (50 mg total) by mouth daily., Disp: 90 tablet, Rfl: 2   Magnesium 400 MG CAPS, Take 400 mg by mouth daily., Disp: 30 capsule, Rfl: 1   metoprolol tartrate (LOPRESSOR) 25 MG tablet, Take 0.5 tablets (12.5 mg total) by mouth 2 (two) times daily., Disp: 90 tablet, Rfl: 2   montelukast (SINGULAIR) 10 MG tablet, TAKE 1 TABLET BY MOUTH EVERY DAY IN THE EVENING, Disp: 30 tablet, Rfl: 3   nicotine (NICODERM CQ - DOSED IN  MG/24 HR) 7 mg/24hr patch, PLACE 1 PATCH (7 MG TOTAL) ONTO THE SKIN AT BEDTIME., Disp: 28 patch, Rfl: 0   nitroGLYCERIN (NITROSTAT) 0.4 MG SL tablet, PLACE 1 TABLET (0.4 MG TOTAL) UNDER THE TONGUE EVERY 5 (FIVE) MINUTES AS NEEDED FOR CHEST PAIN., Disp: 25 tablet, Rfl: 4   pantoprazole (PROTONIX) 40 MG tablet, TAKE 1 TABLET (40 MG TOTAL) BY MOUTH DAILY.STOP OMEPRAZOLE (Patient taking differently: Take 40 mg by mouth 2 (two) times daily. Patient says he is taking twice a day), Disp: 90 tablet, Rfl: 3   pregabalin (LYRICA) 50 MG capsule, Take 1 capsule (50 mg total) by mouth 2 (two) times daily., Disp: 180 capsule, Rfl: 1   Respiratory Therapy Supplies (FLUTTER) DEVI, Use as directed, Disp: 1 each, Rfl: 0   tamsulosin (FLOMAX) 0.4 MG CAPS capsule, Take 1 capsule (0.4 mg total) by mouth daily., Disp: 90 capsule, Rfl: 2    Physical Exam: Blood pressure 107/82, pulse 73, temperature 98.2 F (36.8 C), temperature source Oral, resp. rate (!) 25, height 6' (1.829 m), weight 78.9 kg, SpO2 99 %.   Thin elderly black male Emphysema with exp wheezing and rhonchi No JVP elevation No murmurs Abdomen benign no pain BS positive No edema Good peripheral pulses and right radial Injected sclera   Labs:   Lab Results  Component Value Date   WBC 7.2 12/11/2020   HGB 15.3 12/11/2020   HCT 48.2 12/11/2020   MCV 89.4 12/11/2020   PLT 196 12/11/2020    Recent Labs  Lab 12/11/20 1401  NA 137  K 3.6  CL 104  CO2 23  BUN 14   CREATININE 1.27*  CALCIUM 9.4  PROT 7.1  BILITOT 0.7  ALKPHOS 92  ALT 26  AST 23  GLUCOSE 154*   Lab Results  Component Value Date   CKTOTAL 125 09/29/2011   CKMB 2.1 09/29/2011   TROPONINI <0.30 12/05/2011    Lab Results  Component Value Date   CHOL 151 06/23/2020   CHOL 164 01/14/2020   CHOL 227 (H) 12/02/2019   Lab Results  Component Value Date   HDL 55 06/23/2020   HDL 61 01/14/2020   HDL 53 12/02/2019   Lab Results  Component Value Date   LDLCALC 67 06/23/2020   LDLCALC 91 01/14/2020   LDLCALC 153 (H) 12/02/2019   Lab Results  Component Value Date   TRIG 175 (H) 06/23/2020   TRIG 62 01/14/2020   TRIG 118 12/02/2019   Lab Results  Component Value Date   CHOLHDL 2.7 06/23/2020   CHOLHDL 2.7 01/14/2020   CHOLHDL 4.3 12/02/2019   No results found for: LDLDIRECT    Radiology: DG Chest 2 View  Result Date: 12/11/2020 CLINICAL DATA:  Chest pain and shortness of breath EXAM: CHEST - 2 VIEW COMPARISON:  03/02/2020 FINDINGS: Midline trachea. Normal heart size. No pleural effusion or pneumothorax. Prominent bleb in the medial left apex, similar. Lower lobe predominant interstitial thickening is not significantly changed. Mild volume loss and subsegmental atelectasis at both lung bases. IMPRESSION: No acute cardiopulmonary disease. Diffuse interstitial thickening is similar and consistent with the sequelae of smoking/chronic bronchitis. Electronically Signed   By: Abigail Miyamoto M.D.   On: 12/11/2020 15:11    EKG: NSR no acute ST changes    ASSESSMENT AND PLAN:   Chest Pain: atypical but similar to previous MI. No acute ECG changes Initial troponin negative. Given ongoing smoking shared decision making with patient and wife favor diagnostic cath Monday. Risks  including stroke MI, bleeding contrast reaction and need for surgery discussed willing to proceed. Dr Burt Knack is in lab and will try to arrange with him. Can start heparin if troponin elevates Continue DAT beta  blocker and statin Repeat echo to assess EF make sure remains normal  Diverticulitis:  abdomen seems benign Has been on Ciprol and ? 2nd antibiotic per primary service may be worth repeating abdominal CT scan while admitted  COPD:  discussed smoking cessation. Abnormal lung exam Continue Breztri, ventolin and duoneb  HLD:  continue statin   Signed: Jenkins Rouge 12/11/2020, 4:35 PM

## 2020-12-11 NOTE — Progress Notes (Signed)
Pt had episode of 10/10 chest pain at 2025. One Nitroglycerin SL given, chest pain subsided within 4 minutes. EKG taken following episode showed NSR. Dr. Rudi Rummage notified. Will continue to monitor.

## 2020-12-11 NOTE — ED Provider Notes (Signed)
Emergency Medicine Provider Triage Evaluation Note  Peter Sims , a 70 y.o. male  was evaluated in triage.  Pt complains of Chest pain.  Pain is intermittent, started acutely today   Pain radiates to his left shoulder and his back.Marland Kitchen  He is taken nitroglycerin, some improvement.  The pain is worse with exertion, he took aspirin already today.  No recent cocaine use, no recent sildenafil use.  Review of Systems  Positive: Chest pain Negative: Shortness of breath  Physical Exam  BP 107/77 (BP Location: Right Arm)   Pulse 71   Temp 98.2 F (36.8 C) (Oral)   Resp 14   SpO2 99%  Gen:   Awake, no distress   Resp:  Normal effort  MSK:   Moves extremities without difficulty  Other:    Medical Decision Making  Medically screening exam initiated at 1:58 PM.  Appropriate orders placed.  TREYLIN BURTCH was informed that the remainder of the evaluation will be completed by another provider, this initial triage assessment does not replace that evaluation, and the importance of remaining in the ED until their evaluation is complete.  Chest pain work-up   Sherrill Raring, Hershal Coria 12/11/20 1401    Davonna Belling, MD 12/11/20 1651

## 2020-12-11 NOTE — ED Provider Notes (Signed)
[facility] EMERGENCY DEPARTMENT Provider Note   CSN: [id number] Arrival date & time: 12/11/20  1350     History Chief Complaint  Patient presents with   Chest Pain    Peter Sims is a 70 y.o. male.  The history is provided by the patient, the spouse and medical records. No language interpreter was used.  Chest Pain Pain location:  L chest Pain quality: aching, pressure and sharp   Pain radiates to:  L arm Pain severity:  Moderate Onset quality:  Gradual Duration:  2 hours Timing:  Constant Progression:  Waxing and waning Chronicity:  Recurrent Relieved by:  Nothing Worsened by:  Nothing Ineffective treatments:  None tried Associated symptoms: diaphoresis, nausea, palpitations and shortness of breath   Associated symptoms: no abdominal pain, no back pain, no cough, no fatigue, no fever, no headache, no lower extremity edema, no vomiting and no weakness       Past Medical History:  Diagnosis Date   Arthritis    Bullous emphysema (HCC)    Severe Bilateral   Cocaine abuse ([place])    Colitis    with bleeding   COPD (chronic obstructive pulmonary disease) (HCC)    Coronary artery disease    Dyspnea    GERD (gastroesophageal reflux disease)    Headache    sinusitis   Hypertension    LBP (low back pain)    now chronic   Lumbago    Lung mass    Right Upper Lobe pleural based mass, negative  on PET Scan   Neck pain    nerve pain    Pneumonia    hx   S/P angioplasty with stent 01/13/20 emergent DES to mLAD  01/15/2020   Shortness of breath 03/03/2020   Spine pain    nerve   Therapeutic drug monitoring    Tobacco abuse     Patient Active Problem List   Diagnosis Date Noted   Other abnormal glucose 06/23/2020   Benign prostatic hyperplasia with weak urinary stream 06/23/2020   Onychomycosis 06/23/2020   Tobacco abuse 03/03/2020   Shortness of breath 03/03/2020   S/P angioplasty with stent 01/13/20 emergent DES to mLAD  01/15/2020    ST elevation myocardial infarction involving left anterior descending (LAD) coronary artery ([place]) 01/14/2020   NSTEMI (non-ST elevated myocardial infarction) ([place]) 01/13/2020   Aortic atherosclerosis ([place]) 12/02/2019   Coronary atherosclerosis due to lipid rich plaque 12/02/2019   COPD with chronic bronchitis ([place]) 12/02/2019   Mucopurulent chronic bronchitis ([place]) 12/02/2019   Constipation 03/05/2018   Hyperlipidemia 03/05/2018   Parenchymal renal hypertension 03/05/2018   Tobacco abuse counseling 03/05/2018   Cervical disc disorder at C4-C5 level with radiculopathy 06/18/2017   Chronic cough 02/02/2017   Essential hypertension, benign 11/21/2016   HNP (herniated nucleus pulposus), lumbar 05/03/2016   Bronchopneumonia 09/28/2011   COPD with exacerbation ([place]) 09/28/2011   Chest pain 09/28/2011   Lumbago    Cigarette smoker    COPD GOLD 0/ ab with pseudowheeze component  03/07/2011   Cocaine abuse ([place])    GERD (gastroesophageal reflux disease)    Bullous emphysema (HCC)    Lung mass     Past Surgical History:  Procedure Laterality Date   CARDIAC CATHETERIZATION     COLONOSCOPY     CORONARY/GRAFT ACUTE MI REVASCULARIZATION N/A 01/14/2020   Procedure: Coronary/Graft Acute MI Revascularization;  Surgeon: Sherren Mocha, MD;  Location: [place] [facility];  Service: Cardiovascular;  Laterality: N/A;   LEFT  HEART CATH AND CORONARY ANGIOGRAPHY N/A 01/14/2020   Procedure: LEFT HEART CATH AND CORONARY ANGIOGRAPHY;  Surgeon: Sherren Mocha, MD;  Location: [place] [facility];  Service: Cardiovascular;  Laterality: N/A;   LUMBAR LAMINECTOMY/DECOMPRESSION MICRODISCECTOMY Right 05/03/2016   Procedure: MICRODISCECTOMY LUMBAR FIVE- SACRAL ONE RIGHT;  Surgeon: Ashok Pall, MD;  Location: [place];  Service: Neurosurgery;  Laterality: Right;   NO PAST SURGERIES         Family History  Adopted: Yes  Problem Relation Age of Onset   Cancer Father    Lung cancer Father    Heart failure Sister     Breast cancer Sister    Emphysema Mother     Social History   Tobacco Use   Smoking status: Every Day    Packs/day: 0.50    Years: 45.00    Pack years: 22.50    Types: Cigarettes   Smokeless tobacco: Never   Tobacco comments:    04/22/20 Patient given 1-800-quit-now  and the link to  virtual smoking cessation link  Vaping Use   Vaping Use: Never used  Substance Use Topics   Alcohol use: Yes    Comment: scotch once a week   Drug use: Yes    Types: Cocaine, Marijuana    Comment: crack cocaine quit 2003    Home Medications Prior to Admission medications   Medication Sig Start Date End Date Taking? Authorizing Provider  acetaminophen (TYLENOL) 325 MG tablet Take 2 tablets (650 mg total) by mouth every 4 (four) hours as needed for headache or mild pain. 01/15/20   Isaiah Serge, NP  albuterol (VENTOLIN HFA) 108 (90 Base) MCG/ACT inhaler INHALE 2 PUFFS INTO THE LUNGS EVERY 6 (SIX) HOURS AS NEEDED FOR WHEEZING OR SHORTNESS OF BREATH. 05/03/20 05/03/21  Glendale Chard, MD  aspirin 81 MG chewable tablet Chew 1 tablet (81 mg total) by mouth daily. 01/16/20   Isaiah Serge, NP  atorvastatin (LIPITOR) 80 MG tablet TAKE 1 TABLET (80 MG TOTAL) BY MOUTH DAILY. 01/15/20 01/14/21  Isaiah Serge, NP  Blood Pressure Monitoring (OMRON 3 SERIES BP MONITOR) DEVI USE AS DIRECTED 12/19/19 12/18/20    Budeson-Glycopyrrol-Formoterol (BREZTRI AEROSPHERE) 160-9-4.8 MCG/ACT AERO Inhale 2 puffs into the lungs 2 (two) times daily. 07/15/20   [place] Chard, MD  Carboxymethylcellul-Glycerin (CLEAR EYES FOR DRY EYES OP) Apply 1 drop to eye daily as needed (for dry eyes).    [provider]  clopidogrel (PLAVIX) 75 MG tablet TAKE 1 TABLET BY MOUTH DAILY. STOP BRILINTA 03/03/20 03/03/21  Skeet Latch, MD  cyclobenzaprine (FLEXERIL) 10 MG tablet TAKE 1 TABLET (10 MG TOTAL) BY MOUTH AS NEEDED FOR MUSCLE SPASMS. 02/26/20 02/25/21  [place]endale Chard, MD  diclofenac Sodium (VOLTAREN) 1 % GEL Apply  2 g topically 2 (two) times daily.    [provider]  famotidine (PEPCID) 40 MG tablet TAKE 1 TABLET BY MOUTH TWICE DAILY. 03/19/20 03/19/21  Juanita Craver, MD  ipratropium-albuterol (DUONEB) 0.5-2.5 (3) MG/3ML SOLN Inhale 1 vial via nebulizer 4 times a day 06/08/20   Glendale Chard, MD  losartan (COZAAR) 50 MG tablet Take 1 tablet (50 mg total) by mouth daily. 10/04/20   Glendale Chard, MD  Magnesium 400 MG CAPS Take 400 mg by mouth daily. 06/05/18   Glendale Chard, MD  metoprolol tartrate (LOPRESSOR) 25 MG tablet Take 0.5 tablets (12.5 mg total) by mouth 2 (two) times daily. 08/26/20   Skeet Latch, MD  montelukast (SINGULAIR) 10 MG tablet TAKE 1 TABLET BY  MOUTH EVERY DAY IN THE EVENING 02/26/20 02/25/21  Glendale Chard, MD  nicotine (NICODERM CQ - DOSED IN MG/24 HR) 7 mg/24hr patch PLACE 1 PATCH (7 MG TOTAL) ONTO THE SKIN AT BEDTIME. 01/15/20 01/14/21  Isaiah Serge, NP  nitroGLYCERIN (NITROSTAT) 0.4 MG SL tablet PLACE 1 TABLET (0.4 MG TOTAL) UNDER THE TONGUE EVERY 5 (FIVE) MINUTES AS NEEDED FOR CHEST PAIN. 08/10/20 08/10/21  Deberah Pelton, NP  pantoprazole (PROTONIX) 40 MG tablet TAKE 1 TABLET (40 MG TOTAL) BY MOUTH DAILY.STOP OMEPRAZOLE Patient taking differently: Take 40 mg by mouth 2 (two) times daily. Patient says he is taking twice a day 03/03/20 03/03/21  Skeet Latch, MD  pregabalin (LYRICA) 50 MG capsule Take 1 capsule (50 mg total) by mouth 2 (two) times daily. 11/24/20   Glendale Chard, MD  Respiratory Therapy Supplies (FLUTTER) DEVI Use as directed 02/02/17   Tanda Rockers, MD  tamsulosin (FLOMAX) 0.4 MG CAPS capsule Take 1 capsule (0.4 mg total) by mouth daily. 06/23/20   Glendale Chard, MD  omeprazole (PRILOSEC) 40 MG capsule TAKE 1 CAPSULE BY MOUTH EVERY DAY BEFORE A MEAL 11/05/19 03/03/20  Glendale Chard, MD    Allergies    Penicillins  Review of Systems   Review of Systems  Constitutional:  Positive for chills and diaphoresis. Negative for fatigue and fever.  HENT:   Negative for congestion.   Respiratory:  Positive for shortness of breath. Negative for cough and chest tightness.   Cardiovascular:  Positive for chest pain and palpitations.  Gastrointestinal:  Positive for nausea. Negative for abdominal pain, constipation, diarrhea and vomiting.  Genitourinary:  Negative for flank pain.  Musculoskeletal:  Negative for back pain, neck pain and neck stiffness.  Skin:  Negative for rash and wound.  Neurological:  Negative for weakness, light-headedness and headaches.  Psychiatric/Behavioral:  Negative for agitation and confusion.   All other systems reviewed and are negative.  Physical Exam Updated Vital Signs BP 107/77 (BP Location: Right Arm)   Pulse 71   Temp 98.2 F (36.8 C) (Oral)   Resp 14   SpO2 99%   Physical Exam Vitals and nursing note reviewed.  Constitutional:      General: He is not in acute distress.    Appearance: He is well-developed. He is not ill-appearing, toxic-appearing or diaphoretic.  HENT:     Head: Normocephalic and atraumatic.     Nose: No congestion or rhinorrhea.     Mouth/Throat:     Mouth: Mucous membranes are moist.     Pharynx: No oropharyngeal exudate or posterior oropharyngeal erythema.  Eyes:     Extraocular Movements: Extraocular movements intact.     Conjunctiva/sclera: Conjunctivae normal.     Pupils: Pupils are equal, round, and reactive to light.  Cardiovascular:     Rate and Rhythm: Normal rate and regular rhythm.     Heart sounds: No murmur heard. Pulmonary:     Effort: Pulmonary effort is normal. No respiratory distress.     Breath sounds: Normal breath sounds. No wheezing, rhonchi or rales.  Chest:     Chest wall: No tenderness.  Abdominal:     General: Abdomen is flat.     Palpations: Abdomen is soft.     Tenderness: There is no abdominal tenderness. There is no guarding or rebound.  Musculoskeletal:        General: No tenderness.     Cervical back: Neck supple.     Right lower leg: No  edema.  Left lower leg: No edema.  Skin:    General: Skin is warm and dry.     Capillary Refill: Capillary refill takes less than 2 seconds.     Findings: No erythema.  Neurological:     General: No focal deficit present.     Mental Status: He is alert.     Sensory: No sensory deficit.     Motor: No weakness.  Psychiatric:        Mood and Affect: Mood normal.    ED Results / Procedures / Treatments   Labs (all labs ordered are listed, but only abnormal results are displayed) Labs Reviewed  COMPREHENSIVE METABOLIC PANEL - Abnormal; Notable for the following components:      Result Value   Glucose, Bld 154 (*)    Creatinine, Ser 1.27 (*)    All other components within normal limits  RESP PANEL BY RT-PCR (FLU A&B, COVID) ARPGX2  CBC WITH DIFFERENTIAL/PLATELET  RAPID URINE DRUG SCREEN, HOSP PERFORMED  HIV ANTIBODY (ROUTINE TESTING W REFLEX)  BASIC METABOLIC PANEL  TROPONIN I (HIGH SENSITIVITY)  TROPONIN I (HIGH SENSITIVITY)  TROPONIN I (HIGH SENSITIVITY)    EKG EKG Interpretation  Date/Time:  Saturday December 11 2020 14:54:12 EDT Ventricular Rate:  65 PR Interval:  138 QRS Duration: 86 QT Interval:  378 QTC Calculation: 393 R Axis:   77 Text Interpretation: Normal sinus rhythm Normal ECG When compared to prior, similar appearance. No STEMI Confirmed by Antony Blackbird [phone]) on 12/11/2020 3:15:00 PM  Radiology DG Chest 2 View  Result Date: 12/11/2020 CLINICAL DATA:  Chest pain and shortness of breath EXAM: CHEST - 2 VIEW COMPARISON:  03/02/2020 FINDINGS: Midline trachea. Normal heart size. No pleural effusion or pneumothorax. Prominent bleb in the medial left apex, similar. Lower lobe predominant interstitial thickening is not significantly changed. Mild volume loss and subsegmental atelectasis at both lung bases. IMPRESSION: No acute cardiopulmonary disease. Diffuse interstitial thickening is similar and consistent with the sequelae of smoking/chronic bronchitis.  Electronically Signed   By: Abigail Miyamoto M.D.   On: 12/11/2020 15:11    Procedures Procedures   Medications Ordered in ED Medications  sodium chloride flush (NS) 0.9 % injection 3 mL (0 mLs Intravenous Duplicate 25/85/27 7824)  sodium chloride flush (NS) 0.9 % injection 3 mL (has no administration in time range)  0.9 %  sodium chloride infusion (has no administration in time range)  aspirin chewable tablet 81 mg (has no administration in time range)  0.9% sodium chloride infusion (has no administration in time range)    Followed by  0.9% sodium chloride infusion (has no administration in time range)  aspirin chewable tablet 81 mg (has no administration in time range)  ciprofloxacin (CIPRO) tablet 500 mg (500 mg Oral Given 12/11/20 2129)  atorvastatin (LIPITOR) tablet 80 mg (has no administration in time range)  losartan (COZAAR) tablet 50 mg (has no administration in time range)  metoprolol tartrate (LOPRESSOR) tablet 12.5 mg (12.5 mg Oral Given 12/11/20 2129)  nitroGLYCERIN (NITROSTAT) SL tablet 0.4 mg (0.4 mg Sublingual Given 12/11/20 2029)  nicotine (NICODERM CQ - dosed in mg/24 hours) patch 14 mg (14 mg Transdermal Patient Refused/Not Given 12/11/20 1915)  famotidine (PEPCID) tablet 40 mg (has no administration in time range)  pantoprazole (PROTONIX) EC tablet 40 mg (40 mg Oral Patient Refused/Not Given 12/11/20 1915)  tamsulosin (FLOMAX) capsule 0.4 mg (has no administration in time range)  clopidogrel (PLAVIX) tablet 75 mg (has no administration in time range)  pregabalin (LYRICA)  capsule 50 mg (50 mg Oral Given 12/11/20 2128)  montelukast (SINGULAIR) tablet 10 mg (has no administration in time range)  polyvinyl alcohol (LIQUIFILM TEARS) 1.4 % ophthalmic solution 1 drop (has no administration in time range)  acetaminophen (TYLENOL) tablet 650 mg (has no administration in time range)  ondansetron (ZOFRAN) injection 4 mg (has no administration in time range)  sodium chloride flush  (NS) 0.9 % injection 3 mL (3 mLs Intravenous Given 12/11/20 2020)  sodium chloride flush (NS) 0.9 % injection 3 mL (has no administration in time range)  0.9 %  sodium chloride infusion (has no administration in time range)  umeclidinium bromide (INCRUSE ELLIPTA) 62.5 MCG/INH 1 puff (1 puff Inhalation Patient Refused/Not Given 12/11/20 1915)    And  fluticasone furoate-vilanterol (BREO ELLIPTA) 100-25 MCG/INH 1 puff (1 puff Inhalation Patient Refused/Not Given 12/11/20 1915)  metroNIDAZOLE (FLAGYL) tablet 500 mg (500 mg Oral Given 12/11/20 2128)  morphine 2 MG/ML injection 1 mg (has no administration in time range)  ipratropium-albuterol (DUONEB) 0.5-2.5 (3) MG/3ML nebulizer solution 3 mL (has no administration in time range)  heparin injection 5,000 Units (5,000 Units Subcutaneous Patient Refused/Not Given 12/11/20 2255)    ED Course  I have reviewed the triage vital signs and the nursing notes.  Pertinent labs & imaging results that were available during my care of the patient were reviewed by me and considered in my medical decision making (see chart for details).    MDM Rules/Calculators/A&P                           Peter Sims is a 70 y.o. male with a past medical history significant for CAD status post STEMI with LAD PCI, COPD, hypertension, hyperlipidemia, previous lung mass, and previous substance abuse who presents with chest pain.  Patient reports that his discomfort that started at 1 PM today feels "exactly the same" as his previous STEMI at the end of last year.  He says that he was sitting talking with family and friends when it started having severe discomfort and has left back and quickly went to his left chest and down the left arm.  He reports he was diaphoretic and it was a stabbing and aching pain.  He took several nitro at home that started to slowly improve the symptoms and took aspirin.  He otherwise says that he is been on antibiotics recently for recurrent  diverticulitis but reports that pain has resolved.  He reports no nausea or vomiting at this time and denies any other constipation, diarrhea, or urinary changes.  Denies any leg pain or leg swelling and denies history of DVT or PE.  Patient says that he is concerned as this is how he felt when he had the reported 90% LAD occlusion.  On exam, lungs are clear and chest is nontender.  Abdomen is nontender he does have intact pulses in his upper and lower extremities.  No rash seen to suggest shingles.  No back tenderness on exam.  Vital signs reassuring on arrival.  EKG does not show STEMI and appears similar to prior.  Patient had some labs started in triage including initial troponin that was negative however his symptoms only began several hours ago.  Other labs showed slight increase in his creatinine but no evidence of AKI.  No leukocytosis or anemia.  Chest x-ray shows no pneumothorax or acute abnormality.  Does show some bronchial thickening that is likely chronic due to smoking.  Patient is currently chest pain-free.  Chart review shows that fentanyl helped when he had recurrent pain during his previous admission.  Will give fentanyl if symptoms return however we will call cardiology for recommendations given his high risk chest pain that feel similar to prior.  5:02 PM Cardiology reports that they agree this is a concerning story but given his diverticulitis and other past medical problems, they recommended medicine to admit and they will see the patient in consultation.  They feel that  he will likely get a heart catheterization on Monday given the similarity to prior problems  Medicine will be called for admission.  Final Clinical Impression(s) / ED Diagnoses Final diagnoses:  Precordial pain     Clinical Impression: 1. Precordial pain     Disposition: Admit  This note was prepared with assistance of Dragon voice recognition software. Occasional wrong-word or sound-a-like  substitutions may have occurred due to the inherent limitations of voice recognition software.     Ozetta Flatley, Gwenyth Allegra, MD 12/12/20 0003

## 2020-12-11 NOTE — H&P (Signed)
History and Physical    Peter Sims GXQ:[id number] DOB: [birth year] DOA: 12/11/2020  PCP: Glendale Chard, MD Consultants:  cardiology: Dr. Oval Linsey and pulm: Dr. Inda Merlin, GI: Dr. Collene Mares Patient coming from:  Home - lives with his wife   Chief Complaint: acute onset back pain/chest pain   HPI: Peter Sims is a 70 y.o. male with medical history significant of CAD status post STEMI and stent placement to M LAD in 01/17/2020, hypertension, hyperlipidemia, CAD with chronic bronchitis, BPH, tobacco abuse who presented to ED with acute onset chest pain.  He states that around 1pm he had sudden onset of stabbing pain in his back that pushed forward into his chest. He started to sweat profusely. He also has some tightness on the left side of his neck. Denies any shortness of breath. Pain rated a 10/10 and is sharp in nature.  He took 1 NG and waited 5 minutes so he took another pill and called EMS. He states it felt similar to his previous MI. He states the chest pain continues to come and go. Right now pain is dull and is a 2/10. If he gets "excited" the pain will come back sharp and intense.   Denies any fever/chills, vision changes, shortness of breath, palpitations, stomach pain, leg swelling, N/V. Has some diarrhea from an antibiotic that he is on.   Recently treated for diverticulitis on Monday this past week and is taking cipro/?flagyl, symptoms have resolved except diarrhea from medication. Denies any blood in stool.   Trying to stop smoking, using nicotine patch. Is down to 1/2 ppd. Was at 5-6 cigarettes/day.    ED Course: vitals: Afebrile, blood pressure 107/77, heart rate 71, respiratory rate 14, oxygen 99% on room air. Pertinent labs: Creatinine 1.27, troponin 9>8, chest x-ray with no acute findings.  In ED cardiology consulted and [facility] was asked to admit.  Review of Systems: As per HPI; otherwise review of systems reviewed and negative.   Ambulatory Status:  Ambulates  without assistance   Past Medical History:  Diagnosis Date   Arthritis    Bullous emphysema (HCC)    Severe Bilateral   Cocaine abuse ([place])    Colitis    with bleeding   COPD (chronic obstructive pulmonary disease) (HCC)    Coronary artery disease    Dyspnea    GERD (gastroesophageal reflux disease)    Headache    sinusitis   Hypertension    LBP (low back pain)    now chronic   Lumbago    Lung mass    Right Upper Lobe pleural based mass, negative  on PET Scan   Neck pain    nerve pain    Pneumonia    hx   S/P angioplasty with stent 01/13/20 emergent DES to mLAD  01/15/2020   Shortness of breath 03/03/2020   Spine pain    nerve   Therapeutic drug monitoring    Tobacco abuse     Past Surgical History:  Procedure Laterality Date   CARDIAC CATHETERIZATION     COLONOSCOPY     CORONARY/GRAFT ACUTE MI REVASCULARIZATION N/A 01/14/2020   Procedure: Coronary/Graft Acute MI Revascularization;  Surgeon: Sherren Mocha, MD;  Location: [facility];  Service: Cardiovascular;  Laterality: N/A;   LEFT HEART CATH AND CORONARY ANGIOGRAPHY N/A 01/14/2020   Procedure: LEFT HEART CATH AND CORONARY ANGIOGRAPHY;  Surgeon: Sherren Mocha, MD;  Location: [facility];  Service: Cardiovascular;  Laterality: N/A;   LUMBAR LAMINECTOMY/DECOMPRESSION MICRODISCECTOMY Right  05/03/2016   Procedure: MICRODISCECTOMY LUMBAR FIVE- SACRAL ONE RIGHT;  Surgeon: Ashok Pall, MD;  Location: [facility];  Service: Neurosurgery;  Laterality: Right;   NO PAST SURGERIES      Social History   Socioeconomic History   Marital status: Married    Spouse name: Not on file   Number of children: 4   Years of education: Not on file   Highest education level: Some college, no degree  Occupational History   Occupation: Merchandiser, retail: UNEMPLOYED    Comment: Previous floor maintenance   Tobacco Use   Smoking status: Every Day    Packs/day: 0.50    Years: 45.00    Pack years: 22.50    Types:  Cigarettes   Smokeless tobacco: Never   Tobacco comments:    04/22/20 Patient given 1-800-quit-now  and the link to [place] virtual smoking cessation link  Vaping Use   Vaping Use: Never used  Substance and Sexual Activity   Alcohol use: Yes    Comment: scotch once a week   Drug use: Yes    Types: Cocaine, Marijuana    Comment: crack cocaine quit 2003   Sexual activity: Not on file  Other Topics Concern   Not on file  Social History Narrative   Not on file   Social Determinants of Health   Financial Resource Strain: Not on file  Food Insecurity: Not on file  Transportation Needs: Not on file  Physical Activity: Not on file  Stress: Not on file  Social Connections: Not on file  Intimate Partner Violence: Not on file    Allergies  Allergen Reactions   Penicillins Anaphylaxis and Hives    Has patient had a PCN reaction causing immediate rash, facial/tongue/throat swelling, SOB or lightheadedness with hypotension: No Has patient had a PCN reaction causing severe rash involving mucus membranes or skin necrosis: No Has patient had a PCN reaction that required hospitalization No Has patient had a PCN reaction occurring within the last 10 years: No If all of the above answers are "NO", then may proceed with Cephalosporin use.      Family History  Adopted: Yes  Problem Relation Age of Onset   Cancer Father    Lung cancer Father    Heart failure Sister    Breast cancer Sister    Emphysema Mother     Prior to Admission medications   Medication Sig Start Date End Date Taking? Authorizing Provider  acetaminophen (TYLENOL) 325 MG tablet Take 2 tablets (650 mg total) by mouth every 4 (four) hours as needed for headache or mild pain. 01/15/20   Isaiah Serge, NP  albuterol (VENTOLIN HFA) 108 (90 Base) MCG/ACT inhaler INHALE 2 PUFFS INTO THE LUNGS EVERY 6 (SIX) HOURS AS NEEDED FOR WHEEZING OR SHORTNESS OF BREATH. 05/03/20 05/03/21  Glendale Chard, MD  aspirin 81 MG chewable  tablet Chew 1 tablet (81 mg total) by mouth daily. 01/16/20   Isaiah Serge, NP  atorvastatin (LIPITOR) 80 MG tablet TAKE 1 TABLET (80 MG TOTAL) BY MOUTH DAILY. 01/15/20 01/14/21  Isaiah Serge, NP  Blood Pressure Monitoring (OMRON 3 SERIES BP MONITOR) DEVI USE AS DIRECTED 12/19/19 12/18/20    Budeson-Glycopyrrol-Formoterol (BREZTRI AEROSPHERE) 160-9-4.8 MCG/ACT AERO Inhale 2 puffs into the lungs 2 (two) times daily. 07/15/20   Glendale Chard, MD  Carboxymethylcellul-Glycerin (CLEAR EYES FOR DRY EYES OP) Apply 1 drop to eye daily as needed (for dry eyes).    [provider]  clopidogrel (PLAVIX) 75 MG tablet TAKE 1 TABLET BY MOUTH DAILY. STOP BRILINTA 03/03/20 03/03/21  Skeet Latch, MD  cyclobenzaprine (FLEXERIL) 10 MG tablet TAKE 1 TABLET (10 MG TOTAL) BY MOUTH AS NEEDED FOR MUSCLE SPASMS. 02/26/20 02/25/21  Glendale Chard, MD  diclofenac Sodium (VOLTAREN) 1 % GEL Apply 2 g topically 2 (two) times daily.    [provider]  famotidine (PEPCID) 40 MG tablet TAKE 1 TABLET BY MOUTH TWICE DAILY. 03/19/20 03/19/21  Juanita Craver, MD  ipratropium-albuterol (DUONEB) 0.5-2.5 (3) MG/3ML SOLN Inhale 1 vial via nebulizer 4 times a day 06/08/20   Glendale Chard, MD  losartan (COZAAR) 50 MG tablet Take 1 tablet (50 mg total) by mouth daily. 10/04/20   Glendale Chard, MD  Magnesium 400 MG CAPS Take 400 mg by mouth daily. 06/05/18   Glendale Chard, MD  metoprolol tartrate (LOPRESSOR) 25 MG tablet Take 0.5 tablets (12.5 mg total) by mouth 2 (two) times daily. 08/26/20   Skeet Latch, MD  montelukast (SINGULAIR) 10 MG tablet TAKE 1 TABLET BY MOUTH EVERY DAY IN THE EVENING 02/26/20 02/25/21  Glendale Chard, MD  nicotine (NICODERM CQ - DOSED IN MG/24 HR) 7 mg/24hr patch PLACE 1 PATCH (7 MG TOTAL) ONTO THE SKIN AT BEDTIME. 01/15/20 01/14/21  Isaiah Serge, NP  nitroGLYCERIN (NITROSTAT) 0.4 MG SL tablet PLACE 1 TABLET (0.4 MG TOTAL) UNDER THE TONGUE EVERY 5 (FIVE) MINUTES AS NEEDED FOR CHEST PAIN.  08/10/20 08/10/21  Deberah Pelton, NP  pantoprazole (PROTONIX) 40 MG tablet TAKE 1 TABLET (40 MG TOTAL) BY MOUTH DAILY.STOP OMEPRAZOLE Patient taking differently: Take 40 mg by mouth 2 (two) times daily. Patient says he is taking twice a day 03/03/20 03/03/21  Skeet Latch, MD  pregabalin (LYRICA) 50 MG capsule Take 1 capsule (50 mg total) by mouth 2 (two) times daily. 11/24/20   Glendale Chard, MD  Respiratory Therapy Supplies (FLUTTER) DEVI Use as directed 02/02/17   Tanda Rockers, MD  tamsulosin (FLOMAX) 0.4 MG CAPS capsule Take 1 capsule (0.4 mg total) by mouth daily. 06/23/20   Glendale Chard, MD  omeprazole (PRILOSEC) 40 MG capsule TAKE 1 CAPSULE BY MOUTH EVERY DAY BEFORE A MEAL 11/05/19 03/03/20  Glendale Chard, MD    Physical Exam: Vitals:   12/11/20 1700 12/11/20 1730 12/11/20 1800 12/11/20 1900  BP: 102/72 118/71 96/86   Pulse:      Resp: 17 14 20    Temp:      TempSrc:      SpO2:      Weight:    77.9 kg  Height:         General:  Appears calm and comfortable and is in NAD Eyes:  PERRL, EOMI, normal lids, iris ENT:  grossly normal hearing, lips & tongue, mmm; appropriate dentition. Dentures on top  Neck:  no LAD, masses or thyromegaly; no carotid bruits Cardiovascular:  RRR, no m/r/g. No LE edema.  Respiratory:   rhonchi throughout, occasional expiratory wheeze.  Normal respiratory effort. Abdomen:  soft, NT, ND, NABS Back:   normal alignment, no CVAT Skin:  no rash or induration seen on limited exam Musculoskeletal:  grossly normal tone BUE/BLE, good ROM, no bony abnormality Lower extremity:  No LE edema.  Limited foot exam with no ulcerations.  2+ distal pulses. Psychiatric:  grossly normal mood and affect, speech fluent and appropriate, AOx3 Neurologic:  CN 2-12 grossly intact, moves all extremities in coordinated fashion, sensation intact   Radiological Exams on Admission: Independently reviewed - see discussion  in A/P where applicable  DG Chest 2 View  Result  Date: 12/11/2020 CLINICAL DATA:  Chest pain and shortness of breath EXAM: CHEST - 2 VIEW COMPARISON:  03/02/2020 FINDINGS: Midline trachea. Normal heart size. No pleural effusion or pneumothorax. Prominent bleb in the medial left apex, similar. Lower lobe predominant interstitial thickening is not significantly changed. Mild volume loss and subsegmental atelectasis at both lung bases. IMPRESSION: No acute cardiopulmonary disease. Diffuse interstitial thickening is similar and consistent with the sequelae of smoking/chronic bronchitis. Electronically Signed   By: Abigail Miyamoto M.D.   On: 12/11/2020 15:11    EKG: Independently reviewed.  NSR with rate 86 with sinus arrythmia; nonspecific ST changes with no evidence of acute ischemia   Labs on Admission: I have personally reviewed the available labs and imaging studies at the time of the admission.  Pertinent labs:  Creatinine 1.27 troponin 9>8,   Assessment/Plan Principal Problem:   Chest pain -70 year old male presenting with acute onset chest pain similar to prior STEMI with known CAD status post stent in 2021, hypertension, hyperlipidemia and tobacco abuse.  -Admit to telemetry -Echocardiogram pending -Trend troponin if trends upward will start heparin -Continue medical management with DAPT, beta-blocker, statin -Nitroglycerin as needed -Morphine for severe pain -Cardiology consulted with plans for heart cath on Monday  Active Problems:   CAD (coronary artery disease) S/p STEMI in 11/21 with severe mid LAD stenosis s/p PCI. Mild nonobstructive proximal LAD, mid circumflex, OM2 and proximal RCA stenoses. Normal LV function  -low risk ETT 04/2020  -cath Monday, continue medical management as per above.     Essential hypertension, benign -Well-controlled, continue losartan and metoprolol     Hyperlipidemia -Continue lipitor 80mg   -lipid panel 4/22: LDL 67, TC: 151, TG: 175, HDL: 55    COPD with chronic bronchitis (HCC) -Stable  continue home inhalers: Breztri, SABA prn and duonebs prn     Benign prostatic hyperplasia with weak urinary stream Continue Flomax    Tobacco abuse  -Working on quitting, continue nicotine patch  GERD Continue pepcid and protonix   ? Diverticulitis Had similar pain to last flair and started cipro and flagyl. He has no white count, no blood in stool and no abdominal pain -will finish course of cipro and flagyl x 2 more days   Body mass index is 23.29 kg/m.   Level of care: Telemetry Cardiac DVT prophylaxis:  heparin  Code Status:  Full - confirmed with patient Family Communication: None present. Disposition Plan:  The patient is from: home  Anticipated d/c is to: home  Requires inpatient hospitalization and is at significant risk of worsening, requires constant monitoring, assessment and MDM with specialists. Requires heart cath.  Patient is currently: stable  Consults called: cardiology  Admission status:  inpatient   Dragon dictation used in completing this note.   Orma Flaming MD [facility]   How to contact the [facility] Attending or Consulting provider [place] or covering provider during after hours [place], for this patient?  Check the care team in [facility] and look for a) attending/consulting [facility] provider listed and b) the [facility] team listed Log into [email or web address] and use [place]'s universal password to access. If you do not have the password, please contact the hospital operator. Locate the [facility] provider you are looking for under [facility] and page to a number that you can be directly reached. If you still have difficulty reaching the provider, please page the [facility] (Director on Call) for the Hospitalists  listed on amion for assistance.   12/11/2020, 8:29 PM

## 2020-12-12 DIAGNOSIS — Z720 Tobacco use: Secondary | ICD-10-CM

## 2020-12-12 DIAGNOSIS — I200 Unstable angina: Secondary | ICD-10-CM

## 2020-12-12 DIAGNOSIS — Z20822 Contact with and (suspected) exposure to covid-19: Secondary | ICD-10-CM | POA: Diagnosis not present

## 2020-12-12 DIAGNOSIS — I2510 Atherosclerotic heart disease of native coronary artery without angina pectoris: Secondary | ICD-10-CM | POA: Diagnosis not present

## 2020-12-12 DIAGNOSIS — Z7982 Long term (current) use of aspirin: Secondary | ICD-10-CM | POA: Diagnosis not present

## 2020-12-12 DIAGNOSIS — Z79899 Other long term (current) drug therapy: Secondary | ICD-10-CM | POA: Diagnosis not present

## 2020-12-12 DIAGNOSIS — J449 Chronic obstructive pulmonary disease, unspecified: Secondary | ICD-10-CM | POA: Diagnosis not present

## 2020-12-12 DIAGNOSIS — I10 Essential (primary) hypertension: Secondary | ICD-10-CM | POA: Diagnosis not present

## 2020-12-12 DIAGNOSIS — F17210 Nicotine dependence, cigarettes, uncomplicated: Secondary | ICD-10-CM | POA: Diagnosis not present

## 2020-12-12 LAB — URINALYSIS, ROUTINE W REFLEX MICROSCOPIC
Bacteria, UA: NONE SEEN
Bilirubin Urine: NEGATIVE
Glucose, UA: NEGATIVE mg/dL
Ketones, ur: NEGATIVE mg/dL
Nitrite: NEGATIVE
Protein, ur: NEGATIVE mg/dL
Specific Gravity, Urine: 1.01 (ref 1.005–1.030)
pH: 5 (ref 5.0–8.0)

## 2020-12-12 LAB — BASIC METABOLIC PANEL
Anion gap: 7 (ref 5–15)
BUN: 13 mg/dL (ref 8–23)
CO2: 22 mmol/L (ref 22–32)
Calcium: 8.8 mg/dL — ABNORMAL LOW (ref 8.9–10.3)
Chloride: 107 mmol/L (ref 98–111)
Creatinine, Ser: 1.02 mg/dL (ref 0.61–1.24)
GFR, Estimated: >60 mL/min (ref >60)
Glucose, Bld: 112 mg/dL — ABNORMAL HIGH (ref 70–99)
Potassium: 3.8 mmol/L (ref 3.5–5.1)
Sodium: 136 mmol/L (ref 135–145)

## 2020-12-12 LAB — MRSA NEXT GEN BY PCR, NASAL: MRSA by PCR Next Gen: NOT DETECTED

## 2020-12-12 LAB — HIV ANTIBODY (ROUTINE TESTING W REFLEX): HIV Screen 4th Generation wRfx: NONREACTIVE

## 2020-12-12 MED ORDER — GUAIFENESIN ER 600 MG PO TB12
600.0000 mg | ORAL_TABLET | Freq: Two times a day (BID) | ORAL | Status: DC
  Administered 2020-12-12 – 2020-12-13 (×3): 600 mg via ORAL
  Filled 2020-12-12 (×3): qty 1

## 2020-12-12 MED ORDER — IPRATROPIUM-ALBUTEROL 0.5-2.5 (3) MG/3ML IN SOLN: 3.0000 mL | Freq: Four times a day (QID) | RESPIRATORY_TRACT | Status: DC | PRN

## 2020-12-12 MED ORDER — SODIUM CHLORIDE 0.9 % WEIGHT BASED INFUSION
1.0000 mL/kg/h | INTRAVENOUS | Status: DC
  Administered 2020-12-13: 1 mL/kg/h via INTRAVENOUS

## 2020-12-12 MED ORDER — BACITRACIN-NEOMYCIN-POLYMYXIN 400-5-5000 EX OINT
TOPICAL_OINTMENT | CUTANEOUS | Status: DC | PRN
  Administered 2020-12-12: 1 via TOPICAL
  Filled 2020-12-12: qty 1

## 2020-12-12 MED ORDER — ASPIRIN 81 MG PO CHEW
81.0000 mg | CHEWABLE_TABLET | ORAL | Status: AC
  Administered 2020-12-13: 81 mg via ORAL
  Filled 2020-12-12: qty 1

## 2020-12-12 MED ORDER — SODIUM CHLORIDE 0.9 % WEIGHT BASED INFUSION
3.0000 mL/kg/h | INTRAVENOUS | Status: DC
  Administered 2020-12-13: 3 mL/kg/h via INTRAVENOUS

## 2020-12-12 MED ORDER — SODIUM CHLORIDE 0.9 % IV SOLN
500.0000 mg | INTRAVENOUS | Status: DC
  Administered 2020-12-12 – 2020-12-13 (×2): 500 mg via INTRAVENOUS
  Filled 2020-12-12 (×2): qty 500

## 2020-12-12 MED ORDER — NICOTINE 7 MG/24HR TD PT24
7.0000 mg | MEDICATED_PATCH | Freq: Every day | TRANSDERMAL | Status: DC
  Administered 2020-12-13: 7 mg via TRANSDERMAL
  Filled 2020-12-12 (×2): qty 1

## 2020-12-12 MED ORDER — PREDNISONE 20 MG PO TABS
40.0000 mg | ORAL_TABLET | Freq: Every day | ORAL | Status: DC
  Administered 2020-12-13: 40 mg via ORAL
  Filled 2020-12-12: qty 2

## 2020-12-12 MED ORDER — METHYLPREDNISOLONE SODIUM SUCC 40 MG IJ SOLR
40.0000 mg | Freq: Once | INTRAMUSCULAR | Status: AC
  Administered 2020-12-12: 40 mg via INTRAVENOUS
  Filled 2020-12-12: qty 1

## 2020-12-12 MED ORDER — ASPIRIN 81 MG PO CHEW: 81.0000 mg | CHEWABLE_TABLET | Freq: Every day | ORAL | Status: DC

## 2020-12-12 MED ORDER — IPRATROPIUM-ALBUTEROL 0.5-2.5 (3) MG/3ML IN SOLN: 3.0000 mL | Freq: Three times a day (TID) | RESPIRATORY_TRACT | Status: DC

## 2020-12-12 NOTE — Progress Notes (Signed)
Subjective:  Some sharp atypical pains last night  Pain free this am   Objective:  Vitals:   12/11/20 1730 12/11/20 1800 12/11/20 1900 12/12/20 0751  BP: 118/71 96/86  114/75  Pulse:      Resp: 14 20  18   Temp:    98.8 F (37.1 C)  TempSrc:    Oral  SpO2:      Weight:   77.9 kg   Height:        Intake/Output from previous day: No intake or output data in the 24 hours ending 12/12/20 0853  Physical Exam: Affect appropriate Healthy:  appears stated age HEENT: normal Neck supple with no adenopathy JVP normal no bruits no thyromegaly Lungs emphysema with exp wheezing  Heart:  S1/S2 no murmur, no rub, gallop or click PMI normal Abdomen: benighn, BS positve, no tenderness, no AAA no bruit.  No HSM or HJR Distal pulses intact with no bruits No edema Neuro non-focal Skin warm and dry No muscular weakness  Lab Results: Basic Metabolic Panel: Recent Labs    12/11/20 1401 12/12/20 0300  NA 137 136  K 3.6 3.8  CL 104 107  CO2 23 22  GLUCOSE 154* 112*  BUN 14 13  CREATININE 1.27* 1.02  CALCIUM 9.4 8.8*   Liver Function Tests: Recent Labs    12/11/20 1401  AST 23  ALT 26  ALKPHOS 92  BILITOT 0.7  PROT 7.1  ALBUMIN 3.5   No results for input(s): LIPASE, AMYLASE in the last 72 hours. CBC: Recent Labs    12/11/20 1401  WBC 7.2  NEUTROABS 3.1  HGB 15.3  HCT 48.2  MCV 89.4  PLT 196     Imaging: DG Chest 2 View  Result Date: 12/11/2020 CLINICAL DATA:  Chest pain and shortness of breath EXAM: CHEST - 2 VIEW COMPARISON:  03/02/2020 FINDINGS: Midline trachea. Normal heart size. No pleural effusion or pneumothorax. Prominent bleb in the medial left apex, similar. Lower lobe predominant interstitial thickening is not significantly changed. Mild volume loss and subsegmental atelectasis at both lung bases. IMPRESSION: No acute cardiopulmonary disease. Diffuse interstitial thickening is similar and consistent with the sequelae of smoking/chronic bronchitis.  Electronically Signed   By: Abigail Miyamoto M.D.   On: 12/11/2020 15:11    Cardiac Studies:  ECG:  Orders placed or performed during the hospital encounter of 12/11/20   ED EKG   ED EKG   EKG 12-Lead   EKG 12-Lead   EKG 12-lead   EKG 12-Lead   EKG 12-Lead   EKG 12-Lead   EKG 12-Lead   EKG 12-Lead   EKG 12-Lead   EKG 12-Lead   EKG 12-Lead   EKG 12-Lead   EKG 12-Lead   EKG 12-Lead   EKG 12-Lead     Telemetry:  NSR   Echo: pending   Medications:    aspirin  81 mg Oral Daily   [START ON 12/13/2020] aspirin  81 mg Oral Pre-Cath   [START ON 12/14/2020] aspirin  81 mg Oral Daily   atorvastatin  80 mg Oral Daily   ciprofloxacin  500 mg Oral BID   clopidogrel  75 mg Oral Daily   famotidine  40 mg Oral Daily   umeclidinium bromide  1 puff Inhalation Daily   And   fluticasone furoate-vilanterol  1 puff Inhalation Daily   heparin injection (subcutaneous)  5,000 Units Subcutaneous Q8H   losartan  50 mg Oral Daily   metoprolol tartrate  12.5 mg  Oral BID   metroNIDAZOLE  500 mg Oral Q8H   nicotine  14 mg Transdermal Daily   pantoprazole  40 mg Oral Daily   pregabalin  50 mg Oral BID   sodium chloride flush  3 mL Intravenous Q12H   sodium chloride flush  3 mL Intravenous Q12H   tamsulosin  0.4 mg Oral Daily      sodium chloride     sodium chloride     [START ON 12/13/2020] sodium chloride     Followed by   Derrill Memo ON 12/13/2020] sodium chloride      Assessment/Plan:  Chest Pain: atypical but similar to previous MI. No acute ECG changes Initial troponin negative. Given ongoing smoking shared decision making with patient and wife favor diagnostic cath Monday. Risks including stroke MI, bleeding contrast reaction and need for surgery discussed willing to proceed. Dr Burt Knack is in lab and will try to arrange with him. Can start heparin if troponin elevates Continue DAT beta blocker and statin Repeat echo to assess EF make sure remains normal  Diverticulitis:  abdomen seems benign  Has been on Ciprol and ? 2nd antibiotic per primary service may be worth repeating abdominal CT scan while admitted  COPD:  discussed smoking cessation. Abnormal lung exam Continue Breztri, ventolin and duoneb  HLD:  continue statin   Jenkins Rouge 12/12/2020, 8:53 AM

## 2020-12-12 NOTE — Progress Notes (Addendum)
PROGRESS NOTE    Peter Sims  GGE:[id number] DOB: 08-04-1950 DOA: 12/11/2020 PCP: Glendale Chard, MD    Chief Complaint  Patient presents with   Chest Pain    Brief Narrative:  70yr old male with h/o  tobacco use, COPD /chronic bronchitis,HTN, HLD, borderline DM-2 ,CAD status post STEMI and stent placement to M LAD in 01/17/2020, Presented to the ED with acute onset back pain/chest pain, reports pain is similar to prior STEMI  Subjective:  Continue to have chest pain, but reports less severe Wife at bedside   Assessment & Plan:   Principal Problem:   Chest pain Active Problems:   Essential hypertension, benign   Hyperlipidemia   COPD with chronic bronchitis ([place])   Tobacco abuse   Benign prostatic hyperplasia with weak urinary stream   CAD (coronary artery disease)   Chest pain/back pain, reports similar to prior heart attack -No acute EKG changes, serial troponin negative, echocardiac pending -With h/o CAD s/p Stent -Seen by cardiology, plan to have cardiac cath on Monday, continue asa, plavix, beta-blocker, statin,  -Will follow cardiology recommendation -Per chart review, patient reported  h/o cocaine use, cocaine use can certainly cause chest pain ,UDS ordered on admission, pending collection  -check lipase as well  COPD with chronic bronchitis -Lung exam with wheezing and rhonchi, appear in exacerbation which could cause chest pain as well -cxr "Diffuse interstitial thickening is similar and consistent with the sequelae of smoking/chronic bronchitis." -no hypoxia, start scheduled nebs, zithromax, mucinex, steroids, patient is agreeable   GERD Can cause chest pain as well Continue ppi/pepcid  HTN:  Continue home meds betablocker /Cozaar  Prediabetes Not on meds at home On SSI here  Recently treated for diverticulitis Denies ab pain, no n/v Continue cipro/flagyl treatment course, last dose on 10/17.  BPH Continue Flomax   Cigarette smoking   Report trying to stop smoking, down from 2 pack a day to 5-6 cigarettes/day Decrease nicotine patch from 14mg  to 7mg , report 14 mg to be strong for him    Body mass index is 23.29 kg/m.Marland Kitchen      Unresulted Labs (From admission, onward)     Start     Ordered   12/13/20 4650  Basic metabolic panel  Daily,   R     Question:  Specimen collection method  Answer:  Lab=Lab collect   12/12/20 1714   12/13/20 0500  Magnesium  Tomorrow morning,   R       Question:  Specimen collection method  Answer:  Lab=Lab collect   12/12/20 1714   12/13/20 0500  Lipase, blood  Tomorrow morning,   R       Question:  Specimen collection method  Answer:  Lab=Lab collect   12/12/20 1717   12/12/20 1719  Urinalysis, Routine w reflex microscopic  Once,   R        12/12/20 1718   12/11/20 1612  Urine rapid drug screen (hosp performed)  ONCE - STAT,   STAT        12/11/20 1611              DVT prophylaxis: heparin injection 5,000 Units Start: 12/11/20 2345   Code Status: Full Family Communication: Wife at bedside Disposition:   Status is: Inpatient  Dispo: The patient is from: Home              Anticipated d/c is to: Home  Anticipated d/c date is: Monday or Tuesday, need cardiology clearance                Consultants:  Cardiology  Procedures:  Plan for cardiac cath on Monday  Antimicrobials:   Anti-infectives (From admission, onward)    Start     Dose/Rate Route Frequency Ordered Stop   12/12/20 1345  azithromycin (ZITHROMAX) 500 mg in sodium chloride 0.9 % 250 mL IVPB        500 mg 250 mL/hr over 60 Minutes Intravenous Every 24 hours 12/12/20 1252 12/17/20 1344   12/11/20 2200  metroNIDAZOLE (FLAGYL) tablet 500 mg        500 mg Oral Every 8 hours 12/11/20 2027 12/13/20 2159   12/11/20 2000  ciprofloxacin (CIPRO) tablet 500 mg        500 mg Oral 2 times daily 12/11/20 1752 12/13/20 1959           Objective: Vitals:   12/11/20 1900 12/12/20 0751 12/12/20 0858  12/12/20 1156  BP:  114/75  119/75  Pulse:      Resp:  18  18  Temp:  98.8 F (37.1 C)  98.9 F (37.2 C)  TempSrc:  Oral  Oral  SpO2:   97%   Weight: 77.9 kg     Height:       No intake or output data in the 24 hours ending 12/12/20 1738 Filed Weights   12/11/20 1601 12/11/20 1900  Weight: 78.9 kg 77.9 kg    Examination:  General exam: alert, awake, communicative,calm, NAD Respiratory system: Diffuse bilateral wheezing, rhonchi, respiratory effort normal. Cardiovascular system:  RRR.  Gastrointestinal system: Abdomen is nondistended, soft and nontender.  Normal bowel sounds heard. Central nervous system: Alert and oriented. No focal neurological deficits. Extremities:  no edema Skin: No rashes, lesions or ulcers Psychiatry: Judgement and insight appear normal. Mood & affect appropriate.     Data Reviewed: I have personally reviewed following labs and imaging studies  CBC: Recent Labs  Lab 12/11/20 1401  WBC 7.2  NEUTROABS 3.1  HGB 15.3  HCT 48.2  MCV 89.4  PLT 350    Basic Metabolic Panel: Recent Labs  Lab 12/11/20 1401 12/12/20 0300  NA 137 136  K 3.6 3.8  CL 104 107  CO2 23 22  GLUCOSE 154* 112*  BUN 14 13  CREATININE 1.27* 1.02  CALCIUM 9.4 8.8*    GFR: Estimated Creatinine Clearance: 76.1 mL/min (by C-G formula based on SCr of 1.02 mg/dL).  Liver Function Tests: Recent Labs  Lab 12/11/20 1401  AST 23  ALT 26  ALKPHOS 92  BILITOT 0.7  PROT 7.1  ALBUMIN 3.5    CBG: No results for input(s): GLUCAP in the last 168 hours.   Recent Results (from the past 240 hour(s))  Resp Panel by RT-PCR (Flu A&B, Covid) Nasopharyngeal Swab     Status: None   Collection Time: 12/11/20  3:42 PM   Specimen: Nasopharyngeal Swab; Nasopharyngeal(NP) swabs in vial transport medium  Result Value Ref Range Status   SARS Coronavirus 2 by RT PCR NEGATIVE NEGATIVE Final    Comment: (NOTE) SARS-CoV-2 target nucleic acids are NOT DETECTED.  The SARS-CoV-2 RNA  is generally detectable in upper respiratory specimens during the acute phase of infection. The lowest concentration of SARS-CoV-2 viral copies this assay can detect is 138 copies/mL. A negative result does not preclude SARS-Cov-2 infection and should not be used as the sole basis for treatment or other patient management  decisions. A negative result may occur with  improper specimen collection/handling, submission of specimen other than nasopharyngeal swab, presence of viral mutation(s) within the areas targeted by this assay, and inadequate number of viral copies(<138 copies/mL). A negative result must be combined with clinical observations, patient history, and epidemiological information. The expected result is Negative.  Fact Sheet for Patients:  EntrepreneurPulse.com.au  Fact Sheet for Healthcare Providers:  IncredibleEmployment.be  This test is no t yet approved or cleared by the [place] FDA and  has been authorized for detection and/or diagnosis of SARS-CoV-2 by FDA under an Emergency Use Authorization (EUA). This EUA will remain  in effect (meaning this test can be used) for the duration of the COVID-19 declaration under Section 564(b)(1) of the Act, 21 U.S.C.section 360bbb-3(b)(1), unless the authorization is terminated  or revoked sooner.       Influenza A by PCR NEGATIVE NEGATIVE Final   Influenza B by PCR NEGATIVE NEGATIVE Final    Comment: (NOTE) The Xpert Xpress SARS-CoV-2/FLU/RSV plus assay is intended as an aid in the diagnosis of influenza from Nasopharyngeal swab specimens and should not be used as a sole basis for treatment. Nasal washings and aspirates are unacceptable for Xpert Xpress SARS-CoV-2/FLU/RSV testing.  Fact Sheet for Patients: EntrepreneurPulse.com.au  Fact Sheet for Healthcare Providers: IncredibleEmployment.be  This test is not yet approved or cleared by the  [place] FDA and has been authorized for detection and/or diagnosis of SARS-CoV-2 by FDA under an Emergency Use Authorization (EUA). This EUA will remain in effect (meaning this test can be used) for the duration of the COVID-19 declaration under Section 564(b)(1) of the Act, 21 U.S.C. section 360bbb-3(b)(1), unless the authorization is terminated or revoked.  Performed at [facility], [place] [address] [place], [place] [address]   MRSA Next Gen by PCR, Nasal     Status: None   Collection Time: 12/12/20 12:53 PM   Specimen: Nasal Mucosa; Nasal Swab  Result Value Ref Range Status   MRSA by PCR Next Gen NOT DETECTED NOT DETECTED Final    Comment: (NOTE) The GeneXpert MRSA Assay (FDA approved for NASAL specimens only), is one component of a comprehensive MRSA colonization surveillance program. It is not intended to diagnose MRSA infection nor to guide or monitor treatment for MRSA infections. Test performance is not FDA approved in patients less than 60 years old. Performed at [facility], [place] [address], [place], [place] [address]          Radiology Studies: DG Chest 2 View  Result Date: 12/11/2020 CLINICAL DATA:  Chest pain and shortness of breath EXAM: CHEST - 2 VIEW COMPARISON:  03/02/2020 FINDINGS: Midline trachea. Normal heart size. No pleural effusion or pneumothorax. Prominent bleb in the medial left apex, similar. Lower lobe predominant interstitial thickening is not significantly changed. Mild volume loss and subsegmental atelectasis at both lung bases. IMPRESSION: No acute cardiopulmonary disease. Diffuse interstitial thickening is similar and consistent with the sequelae of smoking/chronic bronchitis. Electronically Signed   By: Abigail Miyamoto M.D.   On: 12/11/2020 15:11        Scheduled Meds:  [START ON 12/13/2020] aspirin  81 mg Oral Pre-Cath   [START ON 12/14/2020] aspirin  81 mg Oral Daily   atorvastatin  80 mg Oral Daily   ciprofloxacin  500  mg Oral BID   clopidogrel  75 mg Oral Daily   famotidine  40 mg Oral Daily   umeclidinium bromide  1 puff Inhalation Daily  And   fluticasone furoate-vilanterol  1 puff Inhalation Daily   guaiFENesin  600 mg Oral BID   heparin injection (subcutaneous)  5,000 Units Subcutaneous Q8H   losartan  50 mg Oral Daily   metoprolol tartrate  12.5 mg Oral BID   metroNIDAZOLE  500 mg Oral Q8H   [START ON 12/13/2020] nicotine  7 mg Transdermal Daily   pantoprazole  40 mg Oral Daily   [START ON 12/13/2020] predniSONE  40 mg Oral Q breakfast   pregabalin  50 mg Oral BID   sodium chloride flush  3 mL Intravenous Q12H   sodium chloride flush  3 mL Intravenous Q12H   tamsulosin  0.4 mg Oral Daily   Continuous Infusions:  sodium chloride     sodium chloride     [START ON 12/13/2020] sodium chloride     Followed by   Derrill Memo ON 12/13/2020] sodium chloride     azithromycin 500 mg (12/12/20 1421)     LOS: 1 day   Time spent: 35 mins Greater than 50% of this time was spent in counseling, explanation of diagnosis, planning of further management, and coordination of care.   Voice Recognition Viviann Spare dictation system was used to create this note, attempts have been made to correct errors. Please contact the author with questions and/or clarifications.   Florencia Reasons, MD PhD FACP [facility]  Available via Epic secure chat 7am-7pm for nonurgent issues Please page for urgent issues To page the attending provider between 7A-7P or the covering provider during after hours 7P-7A, please log into the web site [email or web address] and access using universal [place] password for that web site. If you do not have the password, please call the hospital operator.    12/12/2020, 5:38 PM

## 2020-12-13 ENCOUNTER — Other Ambulatory Visit (HOSPITAL_COMMUNITY): Payer: 59

## 2020-12-13 ENCOUNTER — Encounter (HOSPITAL_COMMUNITY): Payer: Self-pay | Admitting: Cardiovascular Disease

## 2020-12-13 ENCOUNTER — Encounter (HOSPITAL_COMMUNITY)
Admission: EM | Disposition: A | Discharge status: Home / Self Care | Payer: Self-pay | Source: Home / Self Care | Attending: Emergency Medicine

## 2020-12-13 ENCOUNTER — Other Ambulatory Visit (HOSPITAL_COMMUNITY): Payer: Self-pay

## 2020-12-13 DIAGNOSIS — I25110 Atherosclerotic heart disease of native coronary artery with unstable angina pectoris: Secondary | ICD-10-CM | POA: Diagnosis not present

## 2020-12-13 DIAGNOSIS — Z20822 Contact with and (suspected) exposure to covid-19: Secondary | ICD-10-CM | POA: Diagnosis not present

## 2020-12-13 DIAGNOSIS — Z7982 Long term (current) use of aspirin: Secondary | ICD-10-CM | POA: Diagnosis not present

## 2020-12-13 DIAGNOSIS — F17210 Nicotine dependence, cigarettes, uncomplicated: Secondary | ICD-10-CM | POA: Diagnosis not present

## 2020-12-13 DIAGNOSIS — I200 Unstable angina: Secondary | ICD-10-CM | POA: Diagnosis not present

## 2020-12-13 DIAGNOSIS — I2510 Atherosclerotic heart disease of native coronary artery without angina pectoris: Secondary | ICD-10-CM | POA: Diagnosis not present

## 2020-12-13 DIAGNOSIS — Z79899 Other long term (current) drug therapy: Secondary | ICD-10-CM | POA: Diagnosis not present

## 2020-12-13 DIAGNOSIS — I10 Essential (primary) hypertension: Secondary | ICD-10-CM | POA: Diagnosis not present

## 2020-12-13 DIAGNOSIS — J449 Chronic obstructive pulmonary disease, unspecified: Secondary | ICD-10-CM | POA: Diagnosis not present

## 2020-12-13 HISTORY — PX: LEFT HEART CATH AND CORONARY ANGIOGRAPHY: CATH118249

## 2020-12-13 LAB — BASIC METABOLIC PANEL
Anion gap: 8 (ref 5–15)
BUN: 15 mg/dL (ref 8–23)
CO2: 20 mmol/L — ABNORMAL LOW (ref 22–32)
Calcium: 9 mg/dL (ref 8.9–10.3)
Chloride: 106 mmol/L (ref 98–111)
Creatinine, Ser: 1.24 mg/dL (ref 0.61–1.24)
GFR, Estimated: >60 mL/min (ref >60)
Glucose, Bld: 110 mg/dL — ABNORMAL HIGH (ref 70–99)
Potassium: 4 mmol/L (ref 3.5–5.1)
Sodium: 134 mmol/L — ABNORMAL LOW (ref 135–145)

## 2020-12-13 LAB — MAGNESIUM: Magnesium: 2 mg/dL (ref 1.7–2.4)

## 2020-12-13 LAB — LIPASE, BLOOD: Lipase: 30 U/L (ref 11–51)

## 2020-12-13 SURGERY — LEFT HEART CATH AND CORONARY ANGIOGRAPHY
Anesthesia: LOCAL

## 2020-12-13 MED ORDER — SODIUM CHLORIDE 0.9% FLUSH: 3.0000 mL | INTRAVENOUS | Status: DC | PRN

## 2020-12-13 MED ORDER — HEPARIN SODIUM (PORCINE) 1000 UNIT/ML IJ SOLN
INTRAMUSCULAR | Status: DC | PRN
  Administered 2020-12-13: 4000 [IU] via INTRAVENOUS

## 2020-12-13 MED ORDER — IOHEXOL 350 MG/ML SOLN
INTRAVENOUS | Status: DC | PRN
  Administered 2020-12-13: 50 mL

## 2020-12-13 MED ORDER — HEPARIN (PORCINE) IN NACL 1000-0.9 UT/500ML-% IV SOLN
INTRAVENOUS | Status: DC | PRN
  Administered 2020-12-13 (×2): 500 mL

## 2020-12-13 MED ORDER — SODIUM CHLORIDE 0.9 % IV SOLN: INTRAVENOUS | Status: AC

## 2020-12-13 MED ORDER — HEPARIN (PORCINE) IN NACL 1000-0.9 UT/500ML-% IV SOLN
INTRAVENOUS | Status: AC
  Filled 2020-12-13: qty 500

## 2020-12-13 MED ORDER — VERAPAMIL HCL 2.5 MG/ML IV SOLN
INTRAVENOUS | Status: DC | PRN
  Administered 2020-12-13: 10 mL via INTRA_ARTERIAL

## 2020-12-13 MED ORDER — VERAPAMIL HCL 2.5 MG/ML IV SOLN
INTRAVENOUS | Status: AC
  Filled 2020-12-13: qty 2

## 2020-12-13 MED ORDER — MIDAZOLAM HCL 2 MG/2ML IJ SOLN
INTRAMUSCULAR | Status: AC
  Filled 2020-12-13: qty 2

## 2020-12-13 MED ORDER — LABETALOL HCL 5 MG/ML IV SOLN: 10.0000 mg | INTRAVENOUS | Status: DC | PRN

## 2020-12-13 MED ORDER — LIDOCAINE HCL (PF) 1 % IJ SOLN
INTRAMUSCULAR | Status: AC
  Filled 2020-12-13: qty 30

## 2020-12-13 MED ORDER — FENTANYL CITRATE (PF) 100 MCG/2ML IJ SOLN
INTRAMUSCULAR | Status: DC | PRN
  Administered 2020-12-13: 25 ug via INTRAVENOUS

## 2020-12-13 MED ORDER — HYDRALAZINE HCL 20 MG/ML IJ SOLN: 10.0000 mg | INTRAMUSCULAR | Status: DC | PRN

## 2020-12-13 MED ORDER — MIDAZOLAM HCL 2 MG/2ML IJ SOLN
INTRAMUSCULAR | Status: DC | PRN
  Administered 2020-12-13: 2 mg via INTRAVENOUS

## 2020-12-13 MED ORDER — SODIUM CHLORIDE 0.9% FLUSH
3.0000 mL | Freq: Two times a day (BID) | INTRAVENOUS | Status: DC
  Administered 2020-12-13: 3 mL via INTRAVENOUS

## 2020-12-13 MED ORDER — LIDOCAINE HCL (PF) 1 % IJ SOLN
INTRAMUSCULAR | Status: DC | PRN
  Administered 2020-12-13 (×2): 2 mL

## 2020-12-13 MED ORDER — SODIUM CHLORIDE 0.9 % IV SOLN: 250.0000 mL | INTRAVENOUS | Status: DC | PRN

## 2020-12-13 MED ORDER — FENTANYL CITRATE (PF) 100 MCG/2ML IJ SOLN
INTRAMUSCULAR | Status: AC
  Filled 2020-12-13: qty 2

## 2020-12-13 MED ORDER — GUAIFENESIN ER 600 MG PO TB12
600.0000 mg | ORAL_TABLET | Freq: Two times a day (BID) | ORAL | 0 refills | Status: AC
  Filled 2020-12-13: qty 14, 7d supply, fill #0

## 2020-12-13 SURGICAL SUPPLY — 9 items

## 2020-12-13 NOTE — H&P (View-Only) (Signed)
Subjective:  Some sharp atypical pains last night  Pain free this am   Objective:  Vitals:   12/12/20 2018 12/12/20 2242 12/13/20 0544 12/13/20 0811  BP: 110/70 118/77 130/87   Pulse: 76 72 90   Resp: 19  18   Temp: 97.7 F (36.5 C)  98 F (36.7 C)   TempSrc: Oral  Oral   SpO2: 98%  99% 97%  Weight:   77 kg   Height:        Intake/Output from previous day:  Intake/Output Summary (Last 24 hours) at 12/13/2020 0824 Last data filed at 12/13/2020 0600 Gross per 24 hour  Intake 597.17 ml  Output --  Net 597.17 ml    Physical Exam: Affect appropriate Healthy:  appears stated age HEENT: normal Neck supple with no adenopathy JVP normal no bruits no thyromegaly Lungs emphysema with exp wheezing  Heart:  S1/S2 no murmur, no rub, gallop or click PMI normal Abdomen: benighn, BS positve, no tenderness, no AAA no bruit.  No HSM or HJR Distal pulses intact with no bruits No edema Neuro non-focal Skin warm and dry No muscular weakness  Lab Results: Basic Metabolic Panel: Recent Labs    12/12/20 0300 12/13/20 0156  NA 136 134*  K 3.8 4.0  CL 107 106  CO2 22 20*  GLUCOSE 112* 110*  BUN 13 15  CREATININE 1.02 1.24  CALCIUM 8.8* 9.0  MG  --  2.0   Liver Function Tests: Recent Labs    12/11/20 1401  AST 23  ALT 26  ALKPHOS 92  BILITOT 0.7  PROT 7.1  ALBUMIN 3.5   Recent Labs    12/13/20 0156  LIPASE 30   CBC: Recent Labs    12/11/20 1401  WBC 7.2  NEUTROABS 3.1  HGB 15.3  HCT 48.2  MCV 89.4  PLT 196     Imaging: DG Chest 2 View  Result Date: 12/11/2020 CLINICAL DATA:  Chest pain and shortness of breath EXAM: CHEST - 2 VIEW COMPARISON:  03/02/2020 FINDINGS: Midline trachea. Normal heart size. No pleural effusion or pneumothorax. Prominent bleb in the medial left apex, similar. Lower lobe predominant interstitial thickening is not significantly changed. Mild volume loss and subsegmental atelectasis at both lung bases. IMPRESSION: No acute  cardiopulmonary disease. Diffuse interstitial thickening is similar and consistent with the sequelae of smoking/chronic bronchitis. Electronically Signed   By: Abigail Miyamoto M.D.   On: 12/11/2020 15:11    Cardiac Studies:  ECG:  Orders placed or performed during the hospital encounter of 12/11/20   ED EKG   ED EKG   EKG 12-Lead   EKG 12-Lead   EKG 12-lead   EKG 12-Lead   EKG 12-Lead   EKG 12-Lead   EKG 12-Lead   EKG 12-Lead   EKG 12-Lead   EKG 12-Lead   EKG 12-Lead   EKG 12-Lead   EKG 12-Lead   EKG 12-Lead   EKG 12-Lead   EKG 12-Lead     Telemetry:  NSR   Echo: pending   Medications:    [START ON 12/14/2020] aspirin  81 mg Oral Daily   atorvastatin  80 mg Oral Daily   ciprofloxacin  500 mg Oral BID   clopidogrel  75 mg Oral Daily   famotidine  40 mg Oral Daily   umeclidinium bromide  1 puff Inhalation Daily   And   fluticasone furoate-vilanterol  1 puff Inhalation Daily   guaiFENesin  600 mg Oral BID  heparin injection (subcutaneous)  5,000 Units Subcutaneous Q8H   losartan  50 mg Oral Daily   metoprolol tartrate  12.5 mg Oral BID   metroNIDAZOLE  500 mg Oral Q8H   nicotine  7 mg Transdermal Daily   pantoprazole  40 mg Oral Daily   predniSONE  40 mg Oral Q breakfast   pregabalin  50 mg Oral BID   sodium chloride flush  3 mL Intravenous Q12H   sodium chloride flush  3 mL Intravenous Q12H   tamsulosin  0.4 mg Oral Daily      sodium chloride     sodium chloride     sodium chloride 1 mL/kg/hr (12/13/20 0557)   azithromycin 500 mg (12/12/20 1421)    Assessment/Plan:  Chest Pain: atypical but similar to previous MI. No acute ECG changes Initial troponin negative. Given ongoing smoking shared decision making with patient and wife favor diagnostic cath today . Risks including stroke MI, bleeding contrast reaction and need for surgery discussed willing to proceed. Dr Burt Knack is in lab and will try to arrange with him. Can start heparin if troponin elevates Continue DAT  beta blocker and statin Repeat echo to assess EF make sure remains normal  Diverticulitis:  abdomen seems benign Has been on Ciprol and ? 2nd antibiotic per primary service may be worth repeating abdominal CT scan while admitted  COPD:  discussed smoking cessation. Abnormal lung exam Continue Breztri, ventolin and duoneb  HLD:  continue statin   Jenkins Rouge 12/13/2020, 8:24 AM

## 2020-12-13 NOTE — Progress Notes (Signed)
Subjective:  Some sharp atypical pains last night  Pain free this am   Objective:  Vitals:   12/12/20 2018 12/12/20 2242 12/13/20 0544 12/13/20 0811  BP: 110/70 118/77 130/87   Pulse: 76 72 90   Resp: 19  18   Temp: 97.7 F (36.5 C)  98 F (36.7 C)   TempSrc: Oral  Oral   SpO2: 98%  99% 97%  Weight:   77 kg   Height:        Intake/Output from previous day:  Intake/Output Summary (Last 24 hours) at 12/13/2020 0824 Last data filed at 12/13/2020 0600 Gross per 24 hour  Intake 597.17 ml  Output --  Net 597.17 ml    Physical Exam: Affect appropriate Healthy:  appears stated age HEENT: normal Neck supple with no adenopathy JVP normal no bruits no thyromegaly Lungs emphysema with exp wheezing  Heart:  S1/S2 no murmur, no rub, gallop or click PMI normal Abdomen: benighn, BS positve, no tenderness, no AAA no bruit.  No HSM or HJR Distal pulses intact with no bruits No edema Neuro non-focal Skin warm and dry No muscular weakness  Lab Results: Basic Metabolic Panel: Recent Labs    12/12/20 0300 12/13/20 0156  NA 136 134*  K 3.8 4.0  CL 107 106  CO2 22 20*  GLUCOSE 112* 110*  BUN 13 15  CREATININE 1.02 1.24  CALCIUM 8.8* 9.0  MG  --  2.0   Liver Function Tests: Recent Labs    12/11/20 1401  AST 23  ALT 26  ALKPHOS 92  BILITOT 0.7  PROT 7.1  ALBUMIN 3.5   Recent Labs    12/13/20 0156  LIPASE 30   CBC: Recent Labs    12/11/20 1401  WBC 7.2  NEUTROABS 3.1  HGB 15.3  HCT 48.2  MCV 89.4  PLT 196     Imaging: DG Chest 2 View  Result Date: 12/11/2020 CLINICAL DATA:  Chest pain and shortness of breath EXAM: CHEST - 2 VIEW COMPARISON:  03/02/2020 FINDINGS: Midline trachea. Normal heart size. No pleural effusion or pneumothorax. Prominent bleb in the medial left apex, similar. Lower lobe predominant interstitial thickening is not significantly changed. Mild volume loss and subsegmental atelectasis at both lung bases. IMPRESSION: No acute  cardiopulmonary disease. Diffuse interstitial thickening is similar and consistent with the sequelae of smoking/chronic bronchitis. Electronically Signed   By: Abigail Miyamoto M.D.   On: 12/11/2020 15:11    Cardiac Studies:  ECG:  Orders placed or performed during the hospital encounter of 12/11/20   ED EKG   ED EKG   EKG 12-Lead   EKG 12-Lead   EKG 12-lead   EKG 12-Lead   EKG 12-Lead   EKG 12-Lead   EKG 12-Lead   EKG 12-Lead   EKG 12-Lead   EKG 12-Lead   EKG 12-Lead   EKG 12-Lead   EKG 12-Lead   EKG 12-Lead   EKG 12-Lead   EKG 12-Lead     Telemetry:  NSR   Echo: pending   Medications:    [START ON 12/14/2020] aspirin  81 mg Oral Daily   atorvastatin  80 mg Oral Daily   ciprofloxacin  500 mg Oral BID   clopidogrel  75 mg Oral Daily   famotidine  40 mg Oral Daily   umeclidinium bromide  1 puff Inhalation Daily   And   fluticasone furoate-vilanterol  1 puff Inhalation Daily   guaiFENesin  600 mg Oral BID  heparin injection (subcutaneous)  5,000 Units Subcutaneous Q8H   losartan  50 mg Oral Daily   metoprolol tartrate  12.5 mg Oral BID   metroNIDAZOLE  500 mg Oral Q8H   nicotine  7 mg Transdermal Daily   pantoprazole  40 mg Oral Daily   predniSONE  40 mg Oral Q breakfast   pregabalin  50 mg Oral BID   sodium chloride flush  3 mL Intravenous Q12H   sodium chloride flush  3 mL Intravenous Q12H   tamsulosin  0.4 mg Oral Daily      sodium chloride     sodium chloride     sodium chloride 1 mL/kg/hr (12/13/20 0557)   azithromycin 500 mg (12/12/20 1421)    Assessment/Plan:  Chest Pain: atypical but similar to previous MI. No acute ECG changes Initial troponin negative. Given ongoing smoking shared decision making with patient and wife favor diagnostic cath today . Risks including stroke MI, bleeding contrast reaction and need for surgery discussed willing to proceed. Dr Burt Knack is in lab and will try to arrange with him. Can start heparin if troponin elevates Continue DAT  beta blocker and statin Repeat echo to assess EF make sure remains normal  Diverticulitis:  abdomen seems benign Has been on Ciprol and ? 2nd antibiotic per primary service may be worth repeating abdominal CT scan while admitted  COPD:  discussed smoking cessation. Abnormal lung exam Continue Breztri, ventolin and duoneb  HLD:  continue statin   Jenkins Rouge 12/13/2020, 8:24 AM

## 2020-12-13 NOTE — Discharge Summary (Signed)
Physician Discharge Summary  Peter Sims YTK:[id number] DOB: October 10, 1950 DOA: 12/11/2020  PCP: Glendale Chard, MD  Admit date: 12/11/2020 Discharge date: 12/13/2020  Admitted From: Home Discharge disposition: Home   Code Status: Full Code  Diet Recommendation: Cardiac diet  Discharge Diagnosis:   Principal Problem:   Chest pain Active Problems:   Essential hypertension, benign   Hyperlipidemia   COPD with chronic bronchitis ([place])   Tobacco abuse   Benign prostatic hyperplasia with weak urinary stream   CAD (coronary artery disease)   History of Present Illness / Brief narrative:  Patient is a 70 year old male with h/o  tobacco use, COPD /chronic bronchitis,HTN, HLD, borderline DM-2 ,CAD status post STEMI and stent placement to M LAD in 01/17/2020. Patient presented to the ED on 12/11/2020 with complaint of acute onset back pain/chest pain, symptoms similar to his previous experience of ST elevation MI in 2021.   In the ED, patient was afebrile, hemodynamically stable Troponin was normal at 9 and 8. Admitted to [facility].  Cardiology was consulted Underwent cardiac cath today 10/17.  Subjective:  Seen and examined this morning prior to cath.  Pleasant elderly African-American male.  Lying on bed.  Not in distress.  Did not complain of chest pain at that time.  Hospital Course:  Chest pain/back pain History of CAD status post stent -His symptoms were concerning to him because he had similar symptoms prior to MI last year.  -No acute EKG changes, serial troponin negative. -Seen by cardiology.  Underwent cardiac catheter 10/17.  Cath showed stable nonobstructive coronaries. -Chest pain likely of noncardiac etiology.   -we will discharge with recommendation to continue asa, plavix, beta-blocker, statin,   COPD with chronic bronchitis -Imaging showed diffuse interstitial thickening is similar and consistent with the sequelae of smoking/chronic bronchitis. -no hypoxia,   -Currently on nebulizers Mucinex, -No need of steroids or antibiotics at discharge.   GERD -Continue ppi/pepcid   Essential hypertension -Continue home meds betablocker /Cozaar   Prediabetes -A1c 6.4 in April 2022 -Diet controlled.   Recently treated for diverticulitis -Completed the course of Cipro and Flagyl today as recommended by his GI doctor as an outpatient prior to this admission.   -Denies any abdominal pain nausea or vomiting.  No need of repeat imaging at this time.   BPH Continue Flomax    Current daily smoker -Report trying to stop smoking, down from 2 pack a day to 5-6 cigarettes/day    Allergies as of 12/13/2020       Reactions   Penicillins Anaphylaxis, Hives   Has patient had a PCN reaction causing immediate rash, facial/tongue/throat swelling, SOB or lightheadedness with hypotension: No Has patient had a PCN reaction causing severe rash involving mucus membranes or skin necrosis: No Has patient had a PCN reaction that required hospitalization No Has patient had a PCN reaction occurring within the last 10 years: No If all of the above answers are "NO", then may proceed with Cephalosporin use.        Medication List     STOP taking these medications    losartan 50 MG tablet Commonly known as: COZAAR       TAKE these medications    acetaminophen 325 MG tablet Commonly known as: TYLENOL Take 2 tablets (650 mg total) by mouth every 4 (four) hours as needed for headache or mild pain.   albuterol 108 (90 Base) MCG/ACT inhaler Commonly known as: VENTOLIN HFA INHALE 2 PUFFS INTO THE LUNGS EVERY 6 (SIX) HOURS  AS NEEDED FOR WHEEZING OR SHORTNESS OF BREATH.   aspirin 81 MG chewable tablet Chew 1 tablet (81 mg total) by mouth daily.   atorvastatin 80 MG tablet Commonly known as: LIPITOR TAKE 1 TABLET (80 MG TOTAL) BY MOUTH DAILY. What changed: how much to take   Breztri Aerosphere 160-9-4.8 MCG/ACT Aero Generic drug:  Budeson-Glycopyrrol-Formoterol Inhale 2 puffs into the lungs 2 (two) times daily.   ciprofloxacin 500 MG tablet Commonly known as: CIPRO Take 500 mg by mouth 2 (two) times daily.   CLEAR EYES FOR DRY EYES OP Place 1 drop into both eyes daily as needed (for dry eyes).   clopidogrel 75 MG tablet Commonly known as: PLAVIX TAKE 1 TABLET BY MOUTH DAILY. STOP BRILINTA What changed:  how much to take how to take this when to take this   diclofenac Sodium 1 % Gel Commonly known as: VOLTAREN Apply 2 g topically 2 (two) times daily as needed (joint pain).   famotidine 40 MG tablet Commonly known as: PEPCID TAKE 1 TABLET BY MOUTH TWICE DAILY. What changed:  how much to take when to take this   Flutter Devi Use as directed   guaiFENesin 600 MG 12 hr tablet Commonly known as: MUCINEX Take 1 tablet (600 mg total) by mouth 2 (two) times daily for 7 days.   ipratropium-albuterol 0.5-2.5 (3) MG/3ML Soln Commonly known as: DUONEB Inhale 1 vial via nebulizer 4 times a day   metoprolol tartrate 25 MG tablet Commonly known as: LOPRESSOR Take 0.5 tablets (12.5 mg total) by mouth 2 (two) times daily.   montelukast 10 MG tablet Commonly known as: SINGULAIR TAKE 1 TABLET BY MOUTH EVERY DAY IN THE EVENING What changed:  how much to take how to take this when to take this reasons to take this   nicotine 14 mg/24hr patch Commonly known as: NICODERM CQ - dosed in mg/24 hours Place 14 mg onto the skin daily.   nitroGLYCERIN 0.4 MG SL tablet Commonly known as: NITROSTAT PLACE 1 TABLET (0.4 MG TOTAL) UNDER THE TONGUE EVERY 5 (FIVE) MINUTES AS NEEDED FOR CHEST PAIN. What changed: how much to take   Omron 3 Series BP Monitor Devi USE AS DIRECTED   pantoprazole 40 MG tablet Commonly known as: PROTONIX TAKE 1 TABLET (40 MG TOTAL) BY MOUTH DAILY.STOP OMEPRAZOLE What changed:  how much to take how to take this when to take this   pregabalin 50 MG capsule Commonly known as:  LYRICA Take 1 capsule (50 mg total) by mouth 2 (two) times daily.   tamsulosin 0.4 MG Caps capsule Commonly known as: FLOMAX Take 1 capsule (0.4 mg total) by mouth daily.               Discharge Care Instructions  (From admission, onward)           Start     Ordered   12/13/20 0000  No dressing needed        12/13/20 1344            Discharge Instructions:  Follow with Primary MD Glendale Chard, MD in 7 days   Get CBC/BMP checked in next visit within 1 week by PCP or SNF MD. (We routinely change or add medications that can affect your baseline labs and fluid status, therefore we recommend that you get the mentioned basic workup next visit with your PCP, your PCP may decide not to get them or add new tests based on their clinical decision)  On your  next visit with your PCP, please get your medicines reviewed and adjusted.  Please request your PCP  to go over all hospital tests, procedures, radiology results at the follow up, please get all Hospital records sent to your PCP by signing hospital release before you go home.  Activity: As tolerated with Full fall precautions use walker/cane & assistance as needed  Avoid using any recreational substances like cigarette, tobacco, alcohol, or non-prescribed drug.  If you experience worsening of your admission symptoms, develop shortness of breath, life threatening emergency, suicidal or homicidal thoughts you must seek medical attention immediately by calling 911 or calling your MD immediately  if symptoms less severe.  You must read complete instructions/literature along with all the possible adverse reactions/side effects for all the medicines you take and that have been prescribed to you. Take any new medicine only after you have completely understood and accepted all the possible adverse reactions/side effects.   Do not drive, operate heavy machinery, perform activities at heights, swimming or participation in water  activities or provide baby sitting services if your were admitted for syncope or siezures until you have seen by Primary MD or a Neurologist and advised to do so again.  Do not drive when taking Pain medications.  Do not take more than prescribed Pain, Sleep and Anxiety Medications  Wear Seat belts while driving.  Please note You were cared for by a hospitalist during your hospital stay. If you have any questions about your discharge medications or the care you received while you were in the hospital after you are discharged, you can call the unit and asked to speak with the hospitalist on call if the hospitalist that took care of you is not available. Once you are discharged, your primary care physician will handle any further medical issues. Please note that NO REFILLS for any discharge medications will be authorized once you are discharged, as it is imperative that you return to your primary care physician (or establish a relationship with a primary care physician if you do not have one) for your aftercare needs so that they can reassess your need for medications and monitor your lab values.  Follow ups:   Discharge Instructions     Discharge instructions   Complete by: As directed    Follow with Primary MD Glendale Chard, MD in 7 days   Get CBC/BMP checked in next visit within 1 week by PCP or SNF MD. (We routinely change or add medications that can affect your baseline labs and fluid status, therefore we recommend that you get the mentioned basic workup next visit with your PCP, your PCP may decide not to get them or add new tests based on their clinical decision)  On your next visit with your PCP, please get your medicines reviewed and adjusted.  Please request your PCP  to go over all hospital tests, procedures, radiology results at the follow up, please get all Hospital records sent to your PCP by signing hospital release before you go home.  Activity: As tolerated with Full fall  precautions use walker/cane & assistance as needed  Avoid using any recreational substances like cigarette, tobacco, alcohol, or non-prescribed drug.  If you experience worsening of your admission symptoms, develop shortness of breath, life threatening emergency, suicidal or homicidal thoughts you must seek medical attention immediately by calling 911 or calling your MD immediately  if symptoms less severe.  You must read complete instructions/literature along with all the possible adverse reactions/side effects for all  the medicines you take and that have been prescribed to you. Take any new medicine only after you have completely understood and accepted all the possible adverse reactions/side effects.   Do not drive, operate heavy machinery, perform activities at heights, swimming or participation in water activities or provide baby sitting services if your were admitted for syncope or siezures until you have seen by Primary MD or a Neurologist and advised to do so again.  Do not drive when taking Pain medications.  Do not take more than prescribed Pain, Sleep and Anxiety Medications  Wear Seat belts while driving.  Please note You were cared for by a hospitalist during your hospital stay. If you have any questions about your discharge medications or the care you received while you were in the hospital after you are discharged, you can call the unit and asked to speak with the hospitalist on call if the hospitalist that took care of you is not available. Once you are discharged, your primary care physician will handle any further medical issues. Please note that NO REFILLS for any discharge medications will be authorized once you are discharged, as it is imperative that you return to your primary care physician (or establish a relationship with a primary care physician if you do not have one) for your aftercare needs so that they can reassess your need for medications and monitor your lab values.    Increase activity slowly   Complete by: As directed    No dressing needed   Complete by: As directed        Follow-up Information     Glendale Chard, MD Follow up.   Specialty: Internal Medicine Contact information: [address] [place] [address] [phone]         Skeet Latch, MD .   Specialty: Cardiology Contact information: [address] [place] [address] [phone]                 Wound care:   Wound / Incision (Open or Dehisced) 12/12/20 Skin tear Arm Anterior;Right small skin tear (1cmx0.5cm) (Active)  Date First Assessed/Time First Assessed: 12/12/20 1910   Wound Type: (c) Skin tear  Location: Arm  Location Orientation: Anterior;Right  Wound Description (Comments): small skin tear (1cmx0.5cm)  Present on Admission: No    Assessments 12/12/2020  8:15 PM 12/13/2020  2:17 AM  Dressing Type Gauze (Comment) --  Dressing Changed New --  Dressing Status Clean;Dry;Intact --  Dressing Change Frequency PRN --  Site / Wound Assessment Clean;Pink --  Peri-wound Assessment Intact --  Wound Length (cm) -- 0.5 cm  Wound Width (cm) -- 1 cm  Wound Depth (cm) -- 0 cm  Wound Volume (cm^3) -- 0 cm^3  Wound Surface Area (cm^2) -- 0.5 cm^2  Closure None --  Drainage Amount None --  Treatment Other (Comment) --     No Linked orders to display    Discharge Exam:   Vitals:   12/13/20 1149 12/13/20 1151 12/13/20 1204 12/13/20 1220  BP: (!) 125/92 (!) 125/92 (!) 130/96 119/78  Pulse: 73 71 71 71  Resp:  18    Temp:  98.5 F (36.9 C)    TempSrc:  Oral    SpO2: 99% 96% 94% 95%  Weight:      Height:        Body mass index is 23.03 kg/m.  General exam: Pleasant, elderly African-American male.  Not in distress Skin: No rashes, lesions or ulcers. HEENT:  Atraumatic, normocephalic, no obvious bleeding Lungs: Clear to auscultation bilaterally CVS: Regular rate and rhythm, no murmur GI/Abd soft, nontender, nondistended,  bowel sound present CNS: Alert, awake, oriented x3 Psychiatry: Mood appropriate Extremities: No pedal edema, no calf tenderness  Time coordinating discharge: 35 minutes   The results of significant diagnostics from this hospitalization (including imaging, microbiology, ancillary and laboratory) are listed below for reference.    Procedures and Diagnostic Studies:   DG Chest 2 View  Result Date: 12/11/2020 CLINICAL DATA:  Chest pain and shortness of breath EXAM: CHEST - 2 VIEW COMPARISON:  03/02/2020 FINDINGS: Midline trachea. Normal heart size. No pleural effusion or pneumothorax. Prominent bleb in the medial left apex, similar. Lower lobe predominant interstitial thickening is not significantly changed. Mild volume loss and subsegmental atelectasis at both lung bases. IMPRESSION: No acute cardiopulmonary disease. Diffuse interstitial thickening is similar and consistent with the sequelae of smoking/chronic bronchitis. Electronically Signed   By: Abigail Miyamoto M.D.   On: 12/11/2020 15:11     Labs:   Basic Metabolic Panel: Recent Labs  Lab 12/11/20 1401 12/12/20 0300 12/13/20 0156  NA 137 136 134*  K 3.6 3.8 4.0  CL 104 107 106  CO2 23 22 20*  GLUCOSE 154* 112* 110*  BUN 14 13 15   CREATININE 1.27* 1.02 1.24  CALCIUM 9.4 8.8* 9.0  MG  --   --  2.0   GFR Estimated Creatinine Clearance: 62.1 mL/min (by C-G formula based on SCr of 1.24 mg/dL). Liver Function Tests: Recent Labs  Lab 12/11/20 1401  AST 23  ALT 26  ALKPHOS 92  BILITOT 0.7  PROT 7.1  ALBUMIN 3.5   Recent Labs  Lab 12/13/20 0156  LIPASE 30   No results for input(s): AMMONIA in the last 168 hours. Coagulation profile No results for input(s): INR, PROTIME in the last 168 hours.  CBC: Recent Labs  Lab 12/11/20 1401  WBC 7.2  NEUTROABS 3.1  HGB 15.3  HCT 48.2  MCV 89.4  PLT 196   Cardiac Enzymes: No results for input(s): CKTOTAL, CKMB, CKMBINDEX, TROPONINI in the last 168 hours. BNP: Invalid  input(s): POCBNP CBG: No results for input(s): GLUCAP in the last 168 hours. D-Dimer No results for input(s): DDIMER in the last 72 hours. Hgb A1c No results for input(s): HGBA1C in the last 72 hours. Lipid Profile No results for input(s): CHOL, HDL, LDLCALC, TRIG, CHOLHDL, LDLDIRECT in the last 72 hours. Thyroid function studies No results for input(s): TSH, T4TOTAL, T3FREE, THYROIDAB in the last 72 hours.  Invalid input(s): FREET3 Anemia work up No results for input(s): VITAMINB12, FOLATE, FERRITIN, TIBC, IRON, RETICCTPCT in the last 72 hours. Microbiology Recent Results (from the past 240 hour(s))  Resp Panel by RT-PCR (Flu A&B, Covid) Nasopharyngeal Swab     Status: None   Collection Time: 12/11/20  3:42 PM   Specimen: Nasopharyngeal Swab; Nasopharyngeal(NP) swabs in vial transport medium  Result Value Ref Range Status   SARS Coronavirus 2 by RT PCR NEGATIVE NEGATIVE Final    Comment: (NOTE) SARS-CoV-2 target nucleic acids are NOT DETECTED.  The SARS-CoV-2 RNA is generally detectable in upper respiratory specimens during the acute phase of infection. The lowest concentration of SARS-CoV-2 viral copies this assay can detect is 138 copies/mL. A negative result does not preclude SARS-Cov-2 infection and should not be used as the sole basis for treatment or other patient management decisions. A negative result may occur with  improper specimen collection/handling, submission of specimen other than nasopharyngeal  swab, presence of viral mutation(s) within the areas targeted by this assay, and inadequate number of viral copies(<138 copies/mL). A negative result must be combined with clinical observations, patient history, and epidemiological information. The expected result is Negative.  Fact Sheet for Patients:  EntrepreneurPulse.com.au  Fact Sheet for Healthcare Providers:  IncredibleEmployment.be  This test is no t yet approved or  cleared by the [place] FDA and  has been authorized for detection and/or diagnosis of SARS-CoV-2 by FDA under an Emergency Use Authorization (EUA). This EUA will remain  in effect (meaning this test can be used) for the duration of the COVID-19 declaration under Section 564(b)(1) of the Act, 21 U.S.C.section 360bbb-3(b)(1), unless the authorization is terminated  or revoked sooner.       Influenza A by PCR NEGATIVE NEGATIVE Final   Influenza B by PCR NEGATIVE NEGATIVE Final    Comment: (NOTE) The Xpert Xpress SARS-CoV-2/FLU/RSV plus assay is intended as an aid in the diagnosis of influenza from Nasopharyngeal swab specimens and should not be used as a sole basis for treatment. Nasal washings and aspirates are unacceptable for Xpert Xpress SARS-CoV-2/FLU/RSV testing.  Fact Sheet for Patients: EntrepreneurPulse.com.au  Fact Sheet for Healthcare Providers: IncredibleEmployment.be  This test is not yet approved or cleared by the [place] FDA and has been authorized for detection and/or diagnosis of SARS-CoV-2 by FDA under an Emergency Use Authorization (EUA). This EUA will remain in effect (meaning this test can be used) for the duration of the COVID-19 declaration under Section 564(b)(1) of the Act, 21 U.S.C. section 360bbb-3(b)(1), unless the authorization is terminated or revoked.  Performed at [facility], [place] [address], [place], [place] [address]   MRSA Next Gen by PCR, Nasal     Status: None   Collection Time: 12/12/20 12:53 PM   Specimen: Nasal Mucosa; Nasal Swab  Result Value Ref Range Status   MRSA by PCR Next Gen NOT DETECTED NOT DETECTED Final    Comment: (NOTE) The GeneXpert MRSA Assay (FDA approved for NASAL specimens only), is one component of a comprehensive MRSA colonization surveillance program. It is not intended to diagnose MRSA infection nor to guide or monitor treatment for MRSA infections. Test  performance is not FDA approved in patients less than 52 years old. Performed at [facility], [place] [address], [place], [place] [address]      Signed: Terrilee Croak  [facility] 12/13/2020, 1:45 PM

## 2020-12-13 NOTE — Interval H&P Note (Signed)
History and Physical Interval Note:  12/13/2020 8:51 AM  Peter Sims  has presented today for surgery, with the diagnosis of unstable angina.  The various methods of treatment have been discussed with the patient and family. After consideration of risks, benefits and other options for treatment, the patient has consented to  Procedure(s): LEFT HEART CATH AND CORONARY ANGIOGRAPHY (N/A) as a surgical intervention.  The patient's history has been reviewed, patient examined, no change in status, stable for surgery.  I have reviewed the patient's chart and labs.  Questions were answered to the patient's satisfaction.     Sherren Mocha

## 2020-12-14 ENCOUNTER — Other Ambulatory Visit: Payer: Self-pay | Admitting: *Deleted

## 2020-12-14 ENCOUNTER — Other Ambulatory Visit (HOSPITAL_COMMUNITY): Payer: Self-pay

## 2020-12-14 ENCOUNTER — Telehealth: Payer: Self-pay

## 2020-12-14 ENCOUNTER — Encounter: Payer: Self-pay | Admitting: *Deleted

## 2020-12-14 NOTE — Telephone Encounter (Signed)
Transition Care Management Unsuccessful Follow-up Telephone Call  Date of discharge and from where:  12/13/2020 Peter Sims  Attempts:  1st Attempt  Reason for unsuccessful TCM follow-up call:  Unable to leave message

## 2020-12-14 NOTE — Patient Outreach (Signed)
[facility]) Care Management  12/14/2020  CHUCK CABAN Jan 13, 1951 [id number]  Transition of care call/case closure   Referral received:12/13/20 Initial outreach:12/14/20 Insurance: [facility], Medicare Part A   Subjective: Initial successful telephone call to patient's preferred number in order to complete transition of care assessment; 2 HIPAA identifiers verified. Explained purpose of call and completed transition of care assessment.  Mr. Martelli reports feeling pretty fine, and on the mend. He denies chest pain, shortness of breath or worsening in cough, reports productive cough with clear phlegm. He reports wrist cath site without redness or swelling states site a little sore. He is  tolerating diet, denies bowel or bladder problems.  Spouse is  assisting with his recovery.   Reviewed accessing the following [place] Benefits : Discussed  ongoing health  issues  of hypertension, hyperlipidemia, he reports still smoking but has cut way back from 2 packs to 10 cigarettes, states not smoking now and using a nicoderm patch. Discussed education and support for quit smoking , patient declines additional educations. and says he  does not need a referral to one of the [place] chronic disease management programs.  He  uses a [facility] outpatient pharmacy at [facility] outpatient [facility].      Objective:  Mr. Rogelio Waynick was hospitalized at [facility] 10/15-10/17/22 for chest pain, COPD chronic brochitis, Comorbidities include: Hyperlipidemia, hypertension, STEMI 01/17/20.  She was discharged to home on 101//22 without the need for home health services or DME.   Assessment:  Patient voices good understanding of all discharge instructions.  See transition of care flowsheet for assessment details.   Plan:  Reviewed hospital discharge diagnosis of Chest Pain   and discharge treatment plan using hospital discharge instructions, assessing  medication adherence, reviewing problems requiring provider notification, and discussing the importance of follow up with surgeon, primary care provider and/or specialists as directed. Reinforced discharge instructions and follow up appointments, he is agreeable to scheduling post hospital visit with PCP.    No ongoing care management needs identified so will close case to [facility] services and route successful outreach letter with [facility] pamphlet and 24 Hour Nurse Line Magnet to [facility] clinical pool to be mailed to patient's home address.    Joylene Draft, RN, BSN  [facility] Management Coordinator  [phone]- Mobile [phone]- Toll Free Main Office

## 2020-12-23 ENCOUNTER — Encounter (HOSPITAL_BASED_OUTPATIENT_CLINIC_OR_DEPARTMENT_OTHER): Payer: Self-pay

## 2020-12-29 ENCOUNTER — Encounter: Payer: Self-pay | Admitting: Internal Medicine

## 2020-12-29 ENCOUNTER — Ambulatory Visit (INDEPENDENT_AMBULATORY_CARE_PROVIDER_SITE_OTHER): Payer: 59 | Admitting: Internal Medicine

## 2020-12-29 ENCOUNTER — Other Ambulatory Visit: Payer: Self-pay

## 2020-12-29 VITALS — BP 122/80 | HR 85 | Temp 97.4°F | Ht 72.0 in | Wt 172.8 lb

## 2020-12-29 DIAGNOSIS — Z6823 Body mass index (BMI) 23.0-23.9, adult: Secondary | ICD-10-CM

## 2020-12-29 DIAGNOSIS — M6289 Other specified disorders of muscle: Secondary | ICD-10-CM

## 2020-12-29 DIAGNOSIS — R3915 Urgency of urination: Secondary | ICD-10-CM | POA: Diagnosis not present

## 2020-12-29 LAB — POCT URINALYSIS DIPSTICK
Bilirubin, UA: NEGATIVE
Blood, UA: NEGATIVE
Glucose, UA: NEGATIVE
Ketones, UA: NEGATIVE
Leukocytes, UA: NEGATIVE
Nitrite, UA: NEGATIVE
Protein, UA: NEGATIVE
Spec Grav, UA: 1.025 (ref 1.010–1.025)
Urobilinogen, UA: 0.2 E.U./dL
pH, UA: 5 (ref 5.0–8.0)

## 2020-12-29 NOTE — Progress Notes (Signed)
I,[name],acting as a Education administrator for Peter Greenland, MD.,have documented all relevant documentation on the behalf of Peter Greenland, MD,as directed by  Peter Greenland, MD while in the presence of Peter Greenland, MD.  This visit occurred during the SARS-CoV-2 public health emergency.  Safety protocols were in place, including screening questions prior to the visit, additional usage of staff PPE, and extensive cleaning of exam room while observing appropriate contact time as indicated for disinfecting solutions.  Subjective:     [name] , male    DOB: [birth year] , 70 y.o.   MRN: [id number]   Chief Complaint  Patient presents with   Weight Loss    HPI  He presents today for f/u of previous weight loss. He presented several months ago concerned about weight loss and no longer being able to fit his clothes. After further discussion, he admitted to an improved diet as a result of his recent diagnosis of CAD. He admits to cutting out a lot of sweets and processed foods - which likely resulted in his weight loss. Of note, he also reports a limited diet due to his struggles with frequent abdominal discomfort due to diverticulosis.      Past Medical History:  Diagnosis Date   Arthritis    Bullous emphysema (HCC)    Severe Bilateral   Cocaine abuse ([place])    Colitis    with bleeding   COPD (chronic obstructive pulmonary disease) (HCC)    Coronary artery disease    Dyspnea    GERD (gastroesophageal reflux disease)    Headache    sinusitis   Hypertension    LBP (low back pain)    now chronic   Lumbago    Lung mass    Right Upper Lobe pleural based mass, negative  on PET Scan   Neck pain    nerve pain    Pneumonia    hx   S/P angioplasty with stent 01/13/20 emergent DES to mLAD  01/15/2020   Shortness of breath 03/03/2020   Spine pain    nerve   Therapeutic drug monitoring    Tobacco abuse      Family History  Adopted: Yes  Problem Relation Age of  Onset   Cancer Father    Lung cancer Father    Heart failure Sister    Breast cancer Sister    Emphysema Mother      Current Outpatient Medications:    acetaminophen (TYLENOL) 325 MG tablet, Take 2 tablets (650 mg total) by mouth every 4 (four) hours as needed for headache or mild pain., Disp: , Rfl:    albuterol (VENTOLIN HFA) 108 (90 Base) MCG/ACT inhaler, INHALE 2 PUFFS INTO THE LUNGS EVERY 6 (SIX) HOURS AS NEEDED FOR WHEEZING OR SHORTNESS OF BREATH., Disp: 8.5 g, Rfl: 2   aspirin 81 MG chewable tablet, Chew 1 tablet (81 mg total) by mouth daily., Disp: , Rfl:    atorvastatin (LIPITOR) 80 MG tablet, Take 1 tablet (80 mg total) by mouth daily., Disp: 90 tablet, Rfl: 1   Budeson-Glycopyrrol-Formoterol (BREZTRI AEROSPHERE) 160-9-4.8 MCG/ACT AERO, Inhale 2 puffs into the lungs 2 (two) times daily., Disp: 10.7 g, Rfl: 2   Carboxymethylcellul-Glycerin (CLEAR EYES FOR DRY EYES OP), Place 1 drop into both eyes daily as needed (for dry eyes)., Disp: , Rfl:    ciprofloxacin (CIPRO) 500 MG tablet, Take 500 mg by mouth 2 (two) times daily., Disp: , Rfl:    clopidogrel (PLAVIX)  75 MG tablet, TAKE 1 TABLET BY MOUTH DAILY. STOP BRILINTA (Patient taking differently: Take 75 mg by mouth daily.), Disp: 93 tablet, Rfl: 3   diclofenac Sodium (VOLTAREN) 1 % GEL, Apply 2 g topically 2 (two) times daily as needed (joint pain)., Disp: , Rfl:    famotidine (PEPCID) 40 MG tablet, TAKE 1 TABLET BY MOUTH TWICE DAILY. (Patient taking differently: Take 40 mg by mouth daily.), Disp: 60 tablet, Rfl: 12   ipratropium-albuterol (DUONEB) 0.5-2.5 (3) MG/3ML SOLN, Inhale 1 vial via nebulizer 4 times a day (Patient not taking: No sig reported), Disp: 270 mL, Rfl: PRN   metoprolol tartrate (LOPRESSOR) 25 MG tablet, Take 0.5 tablets (12.5 mg total) by mouth 2 (two) times daily., Disp: 90 tablet, Rfl: 2   montelukast (SINGULAIR) 10 MG tablet, TAKE 1 TABLET BY MOUTH EVERY DAY IN THE EVENING (Patient taking differently: Take 10 mg by  mouth daily as needed (allergies).), Disp: 30 tablet, Rfl: 3   nicotine (NICODERM CQ - DOSED IN MG/24 HOURS) 14 mg/24hr patch, Place 14 mg onto the skin daily., Disp: , Rfl:    nitroGLYCERIN (NITROSTAT) 0.4 MG SL tablet, Place 1 tablet (0.4 mg total) under the tongue every 5 (five) minutes as needed for chest pain., Disp: 25 tablet, Rfl: 4   pantoprazole (PROTONIX) 40 MG tablet, TAKE 1 TABLET (40 MG TOTAL) BY MOUTH DAILY.STOP OMEPRAZOLE (Patient taking differently: Take 40 mg by mouth daily.), Disp: 90 tablet, Rfl: 3   pregabalin (LYRICA) 50 MG capsule, Take 1 capsule (50 mg total) by mouth 2 (two) times daily., Disp: 180 capsule, Rfl: 1   Respiratory Therapy Supplies (FLUTTER) DEVI, Use as directed, Disp: 1 each, Rfl: 0   tamsulosin (FLOMAX) 0.4 MG CAPS capsule, Take 1 capsule (0.4 mg total) by mouth daily., Disp: 90 capsule, Rfl: 2   Allergies  Allergen Reactions   Penicillins Anaphylaxis and Hives    Has patient had a PCN reaction causing immediate rash, facial/tongue/throat swelling, SOB or lightheadedness with hypotension: No Has patient had a PCN reaction causing severe rash involving mucus membranes or skin necrosis: No Has patient had a PCN reaction that required hospitalization No Has patient had a PCN reaction occurring within the last 10 years: No If all of the above answers are "NO", then may proceed with Cephalosporin use.       Review of Systems  Constitutional: Negative.   Respiratory: Negative.    Cardiovascular: Negative.   Gastrointestinal: Negative.   Genitourinary:  Positive for urgency.  Psychiatric/Behavioral: Negative.    All other systems reviewed and are negative.   Today's Vitals   12/29/20 1119  BP: 122/80  Pulse: 85  Temp: (!) 97.4 F (36.3 C)  Weight: 172 lb 12.8 oz (78.4 kg)  Height: 6' (1.829 m)   Body mass index is 23.44 kg/m.  Wt Readings from Last 3 Encounters:  12/29/20 172 lb 12.8 oz (78.4 kg)  12/13/20 169 lb 12.8 oz (77 kg)  11/04/20  174 lb 12.8 oz (79.3 kg)    BP Readings from Last 3 Encounters:  12/29/20 122/80  12/13/20 133/76  11/04/20 114/70    Objective:  Physical Exam Vitals and nursing note reviewed.  Constitutional:      Appearance: Normal appearance.  HENT:     Nose:     Comments: Masked     Mouth/Throat:     Comments: Masked  Eyes:     Extraocular Movements: Extraocular movements intact.  Cardiovascular:     Rate and Rhythm:  Normal rate and regular rhythm.     Heart sounds: Normal heart sounds.  Pulmonary:     Effort: Pulmonary effort is normal.     Breath sounds: Normal breath sounds.  Musculoskeletal:     Cervical back: Normal range of motion.  Skin:    General: Skin is warm.  Neurological:     General: No focal deficit present.     Mental Status: He is alert.  Psychiatric:        Mood and Affect: Mood normal.        Assessment And Plan:     1. Body mass index (BMI) of 23.0-23.9 in adult Comments: He has lost 2 lbs since Sept 2022. He is encouraged to schedule f/u appt with Nutritionist for further assistance with meal planning.   2. Urinary urgency Comments: I requested urinalysis today; however, he states he is unable to give a sample.  - POCT Urinalysis Dipstick (81002)  3. Pelvic floor dysfunction Comments: Jan 2021 Urology notes reviewed. I will refer him for pelvic floor PT to [facility]. He is encouraged to comply with their recommendations.  - Ambulatory referral to Physical Therapy    Patient was given opportunity to ask questions. Patient verbalized understanding of the plan and was able to repeat key elements of the plan. All questions were answered to their satisfaction.   I, Peter Greenland, MD, have reviewed all documentation for this visit. The documentation on 12/29/20 for the exam, diagnosis, procedures, and orders are all accurate and complete.   IF YOU HAVE BEEN REFERRED TO A SPECIALIST, IT MAY TAKE 1-2 WEEKS TO SCHEDULE/PROCESS THE REFERRAL. IF YOU  HAVE NOT HEARD FROM US/SPECIALIST IN TWO WEEKS, PLEASE GIVE [place] A CALL AT [phone].   THE PATIENT IS ENCOURAGED TO PRACTICE SOCIAL DISTANCING DUE TO THE COVID-19 PANDEMIC.

## 2020-12-29 NOTE — Patient Instructions (Signed)
Pelvic Floor Dysfunction, Male   Pelvic floor dysfunction (PFD) is a condition that results when the group of muscles and connective tissues that support the organs in the pelvis (pelvic floor muscles) do not work well. These muscles and their connections form a sling that supports the colon and bladder. In men, these muscles also support the prostate gland. PFD causes pelvic floor muscles to be too weak, too tight, or both. In PFD, muscle movements are not coordinated. This may cause bowel or bladder problems. It may also cause pain. What are the causes? This condition may be caused by an injury to the pelvic area or by a weakening of pelvic muscles. In many cases, the exact cause is not known. What increases the risk? The following factors may make you more likely to develop PFD: Having chronic bladder tissue inflammation (interstitial cystitis). Being an older person. Being overweight. History of radiation treatment for cancer in the pelvic region. Previous pelvic surgery, such as removal of the prostate gland (prostatectomy). What are the signs or symptoms? Symptoms of this condition vary and may include: Bladder symptoms, such as: Trouble starting urination and emptying the bladder. Frequent urinary tract infections. Leaking urine when coughing, laughing, or exercising (stress incontinence). Having to pass urine urgently or frequently. Pain when passing urine. Bowel symptoms, such as: Constipation. Urgent or frequent bowel movements. Incomplete bowel movements. Painful bowel movements. Leaking stool or gas. Unexplained genital or rectal pain. Genital or rectal muscle spasms. Low back pain. Sexual dysfunction, such as erectile dysfunction, premature ejaculation, or pain during or after sexual activity. How is this diagnosed? This condition is diagnosed based on: Your symptoms and medical history. A physical exam. During the exam, your health care provider may check your pelvic  muscles for tightness, spasm, pain, or weakness. This may include a rectal exam. In some cases, you may have diagnostic tests, such as: Electrical muscle function tests. Urine flow testing. X-ray tests of bowel function. Ultrasound of the pelvic organs. How is this treated? Treatment for this condition depends on your symptoms. Treatment options include: Physical therapy. This may include Kegel exercises to help relax or strengthen the pelvic floor muscles. Biofeedback. This type of therapy provides feedback on how tight your pelvic floor muscles are so that you can learn to control them. Massage therapy. A treatment that involves electrical stimulation of the pelvic floor muscles to help control pain (transcutaneous electrical nerve stimulation, or TENS). Sound wave therapy (ultrasound) to reduce muscle spasms. Medicines, such as: Muscle relaxants. Bladder control medicines. Surgery to reconstruct or support pelvic floor muscles may be an option if other treatments do not help. Follow these instructions at home: Activity Do your usual activities as told by your health care provider. Ask your health care provider if you should modify any activities. Do pelvic floor strengthening or relaxing exercises at home as told by your physical therapist. Lifestyle Maintain a healthy weight. Eat foods that are high in fiber, such as beans, whole grains, and fresh fruits and vegetables. Limit foods that are high in fat and processed sugars, such as fried or sweet foods. Manage stress with relaxation techniques such as yoga or meditation. General instructions If you have problems with leakage: Use absorbable pads or wear padded underwear. Wash your genital and anal area frequently with mild soap. Keep your genital and anal area as clean and dry as possible. Ask your health care provider if you should try a barrier cream to prevent skin irritation. Take warm baths to relieve  pelvic muscle tension or  spasms. Take over-the-counter and prescription medicines only as told by your health care provider. Keep all follow-up visits. How is this prevented? The cause of PFD is not always known, but there are a few things you can do to reduce the risk of developing this condition, including: Staying at a healthy weight. Getting regular exercise. Managing stress. Contact a health care provider if: Your symptoms are not improving with home care. You have signs or symptoms of PFD that get worse. You develop new signs or symptoms. You have signs of a urinary tract infection, such as: Fever. Chills. Increased urinary frequency. A burning feeling when urinating. You have not had a bowel movement in 3 days (constipation). Summary Pelvic floor dysfunction results when the muscles and connective tissues in your pelvic floor do not work well. These muscles and their connections form a sling that supports your colon and bladder. In men, these muscles also support the prostate gland. PFD may be caused by an injury to the pelvic area or by a weakening of pelvic muscles. PFD causes pelvic floor muscles to be too weak, too tight, or a combination of both. Symptoms may vary from person to person. In most cases, PFD can be treated with physical therapies and medicines. Surgery may be an option if other treatments do not help. This information is not intended to replace advice given to you by your health care provider. Make sure you discuss any questions you have with your health care provider. Document Revised: 06/23/2020 Document Reviewed: 06/23/2020 [employer] Patient Education  [place].

## 2020-12-31 ENCOUNTER — Other Ambulatory Visit: Payer: Self-pay | Admitting: Cardiology

## 2020-12-31 ENCOUNTER — Other Ambulatory Visit (HOSPITAL_COMMUNITY): Payer: Self-pay

## 2020-12-31 MED ORDER — ATORVASTATIN CALCIUM 80 MG PO TABS
80.0000 mg | ORAL_TABLET | Freq: Every day | ORAL | 1 refills | Status: DC
  Filled 2020-12-31: qty 90, 90d supply, fill #0
  Filled 2021-04-07: qty 90, 90d supply, fill #1

## 2020-12-31 MED FILL — Pantoprazole Sodium EC Tab 40 MG (Base Equiv): ORAL | 90 days supply | Qty: 90 | Fill #1 | Status: AC

## 2021-02-08 NOTE — Progress Notes (Signed)
Cardiology Office Note:    Date:  02/09/2021   ID:  Peter Sims, DOB [birth year], MRN [id number]  PCP:  Peter Chard, MD   [facility] Cardiologist:  Skeet Latch, MD     Referring MD: Peter Chard, MD   No chief complaint on file.   History of Present Illness:    Peter Sims is a 70 y.o. male with a hx of  CAD s/p anterior STEMI, hypertension, hyperlipidemia, tobacco abuse, bullous emphysema, COPD, and GERD here for follow up.  He was initially seen 12/2019 for the evaluation of chest pain at the request.  He was seen in the ED 11/2019 with precordial chest pain.  He also had pain in his back and neck at the time.  He had an episode of getting lightheaded and sweaty after having a bowel movement.  He felt lethargic and sweaty.  He also had slurred speech.  He called EMS and did orthostatic vitals that reportedly showed hypotension.  His blood pressure improved and they decided that he did not need to go to the hospital.  He called his PCP the following day and they recommended that he go to the emergency department for evaluation.  In the ED, high-sensitivity troponin revealed a pattern of 8-->22-->7.  EKG revealed sinus tachycardia to 112 bpm.  There were no acute ischemic changes.  Chest CT-A revealed pulmonary nodule, foci of atherosclerosis in the coronaries and aorta.  He was felt to be stable to be discharged and followed up with cardiology as an outpatient.  [name] recalls another similar episode that occurred when standing.  He was outside talking with a neighbor.  He then got a sharp pain in his chest.  He started walking into the kitchen and started seating profusely.  He was unable to speak to them.  He knew what he wanted to say but couldn't get the words out.  He nearly passed out.  The pain he describes was debilitating, sharp pain in the shoulder that felt like a spasm.  He has experienced pain like this other times when he turns too  quickly.    At his initial appointment, he was referred for coronary CT-A.  However before he could have this procedure he presented back to the hospital with chest pain.  Initial EKG was not consistent with STEMI.  High-sensitivity troponin was elevated to the 80s and then increased to 106.  He had an acute episode of chest pain overnight and was taken to the Cath Lab.  He developed an anterior S elevation MI and had PCI of the mid LAD.  He otherwise had mild to moderate lesions that were medically managed.  He was started on metoprolol and ticagrelor.   Mr. Verdell continued to report atypical chest pain. He presented to the ED 03/03/20. EKG at this time showed sinus arrhythmia. Cardiac enzymes were negative. He noted this pain was notably different from when he had a heart attack. His blood pressure has been well controlled since his cath.  His ETT performed 04/2020 was normal. He saw Peter Memos, NP on 07/2020 and continued to complain of atypical chest pain but was reassured that it was non-cardiac. He went the to ED 11/2020. Cardiac enzymes were negative. He underwent repeat LHC 11/2020 and had a patent LED stent and otherwise non-obstructive disease unchanged from prior. He was again encouraged to quit smoking.   Today, he is doing well and is accompanied by his wife. He felt some L-sided  chest pain yesterday but he attributed this to bending over to tie his shoes. He also had an episode of chest pain which he took a nitroglycerin and laid down which improved the pain. He is not taking the NTG as often as he used to but is still occasionally taking it. In October, he tore his chest wall from coughing due to bronchitis that he was unaware of. The pain started in his lower back and moved towards his chest. He presented to the ED because the pain felt similar to his previous STEMI episode. He underwent a repeat catheterization and woke up during to the procedure due to feeling a pressure in his arm.  However, he did well during the procedure. He has been running recently and walks everyday. He quit smoking this year because of the various medications he is taking. He currently smokes 0.5ppd. He is trying to quit at the beginning of the next year. He denies any palpitations, shortness of breath, lightheadedness, headaches, syncope, orthopnea, PND, lower extremity edema or exertional symptoms.  Past Medical History:  Diagnosis Date   Arthritis    Bullous emphysema (HCC)    Severe Bilateral   Cocaine abuse ([place])    Colitis    with bleeding   COPD (chronic obstructive pulmonary disease) (HCC)    Coronary artery disease    Dyspnea    GERD (gastroesophageal reflux disease)    Headache    sinusitis   Hypertension    LBP (low back pain)    now chronic   Lumbago    Lung mass    Right Upper Lobe pleural based mass, negative  on PET Scan   Neck pain    nerve pain    Pneumonia    hx   S/P angioplasty with stent 01/13/20 emergent DES to mLAD  01/15/2020   Shortness of breath 03/03/2020   Spine pain    nerve   Therapeutic drug monitoring    Tobacco abuse     Past Surgical History:  Procedure Laterality Date   CARDIAC CATHETERIZATION     COLONOSCOPY     CORONARY/GRAFT ACUTE MI REVASCULARIZATION N/A 01/14/2020   Procedure: Coronary/Graft Acute MI Revascularization;  Surgeon: Sherren Mocha, MD;  Location: [facility];  Service: Cardiovascular;  Laterality: N/A;   LEFT HEART CATH AND CORONARY ANGIOGRAPHY N/A 01/14/2020   Procedure: LEFT HEART CATH AND CORONARY ANGIOGRAPHY;  Surgeon: Sherren Mocha, MD;  Location: [facility];  Service: Cardiovascular;  Laterality: N/A;   LEFT HEART CATH AND CORONARY ANGIOGRAPHY N/A 12/13/2020   Procedure: LEFT HEART CATH AND CORONARY ANGIOGRAPHY;  Surgeon: Sherren Mocha, MD;  Location: [facility];  Service: Cardiovascular;  Laterality: N/A;   LUMBAR LAMINECTOMY/DECOMPRESSION MICRODISCECTOMY Right 05/03/2016   Procedure:  MICRODISCECTOMY LUMBAR FIVE- SACRAL ONE RIGHT;  Surgeon: Ashok Pall, MD;  Location: [facility];  Service: Neurosurgery;  Laterality: Right;   NO PAST SURGERIES      Current Medications: Current Meds  Medication Sig   acetaminophen (TYLENOL) 325 MG tablet Take 2 tablets (650 mg total) by mouth every 4 (four) hours as needed for headache or mild pain.   albuterol (VENTOLIN HFA) 108 (90 Base) MCG/ACT inhaler INHALE 2 PUFFS INTO THE LUNGS EVERY 6 (SIX) HOURS AS NEEDED FOR WHEEZING OR SHORTNESS OF BREATH.   aspirin 81 MG chewable tablet Chew 1 tablet (81 mg total) by mouth daily.   atorvastatin (LIPITOR) 80 MG tablet Take 1 tablet (80 mg total) by mouth daily.  Budeson-Glycopyrrol-Formoterol (BREZTRI AEROSPHERE) 160-9-4.8 MCG/ACT AERO Inhale 2 puffs into the lungs 2 (two) times daily.   Carboxymethylcellul-Glycerin (CLEAR EYES FOR DRY EYES OP) Place 1 drop into both eyes daily as needed (for dry eyes).   diclofenac Sodium (VOLTAREN) 1 % GEL Apply 2 g topically 2 (two) times daily as needed (joint pain).   famotidine (PEPCID) 40 MG tablet TAKE 1 TABLET BY MOUTH TWICE DAILY. (Patient taking differently: Take 40 mg by mouth daily.)   ipratropium-albuterol (DUONEB) 0.5-2.5 (3) MG/3ML SOLN Inhale 1 vial via nebulizer 4 times a day   metoprolol tartrate (LOPRESSOR) 25 MG tablet Take 0.5 tablets (12.5 mg total) by mouth 2 (two) times daily.   montelukast (SINGULAIR) 10 MG tablet TAKE 1 TABLET BY MOUTH EVERY DAY IN THE EVENING (Patient taking differently: Take 10 mg by mouth daily as needed (allergies).)   nicotine (NICODERM CQ - DOSED IN MG/24 HOURS) 14 mg/24hr patch Place 14 mg onto the skin daily.   nitroGLYCERIN (NITROSTAT) 0.4 MG SL tablet Place 1 tablet (0.4 mg total) under the tongue every 5 (five) minutes as needed for chest pain.   pantoprazole (PROTONIX) 40 MG tablet TAKE 1 TABLET (40 MG TOTAL) BY MOUTH DAILY.STOP OMEPRAZOLE (Patient taking differently: Take 40 mg by mouth daily.)   pregabalin  (LYRICA) 50 MG capsule Take 1 capsule (50 mg total) by mouth 2 (two) times daily.   Respiratory Therapy Supplies (FLUTTER) DEVI Use as directed   tamsulosin (FLOMAX) 0.4 MG CAPS capsule Take 1 capsule (0.4 mg total) by mouth daily.   [DISCONTINUED] clopidogrel (PLAVIX) 75 MG tablet TAKE 1 TABLET BY MOUTH DAILY. STOP BRILINTA (Patient taking differently: Take 75 mg by mouth daily.)     Allergies:   Penicillins   Social History   Socioeconomic History   Marital status: Married    Spouse name: Not on file   Number of children: 4   Years of education: Not on file   Highest education level: Some college, no degree  Occupational History   Occupation: Merchandiser, retail: UNEMPLOYED    Comment: Previous floor maintenance   Tobacco Use   Smoking status: Every Day    Packs/day: 0.50    Years: 45.00    Pack years: 22.50    Types: Cigarettes   Smokeless tobacco: Never   Tobacco comments:    04/22/20 Patient given 1-800-quit-now  and the link to [place] virtual smoking cessation link  Vaping Use   Vaping Use: Never used  Substance and Sexual Activity   Alcohol use: Yes    Comment: scotch once a week   Drug use: Yes    Types: Cocaine, Marijuana    Comment: crack cocaine quit 2003   Sexual activity: Not on file  Other Topics Concern   Not on file  Social History Narrative   Not on file   Social Determinants of Health   Financial Resource Strain: Not on file  Food Insecurity: Not on file  Transportation Needs: Not on file  Physical Activity: Not on file  Stress: Not on file  Social Connections: Not on file     Family History: The patient's family history includes Breast cancer in his sister; Cancer in his father; Emphysema in his mother; Heart failure in his sister; Lung cancer in his father. He was adopted.  ROS:   Please see the history of present illness.    (+) Chest pain (+) Tobacco Use (+) Depression All other systems reviewed and are  negative.  EKGs/Labs/Other Studies Reviewed:    The following studies were reviewed today: LHC and Coronary Angiogram 12/13/20   Prox LAD lesion is 40% stenosed.   Prox Cx lesion is 40% stenosed.   Ost RCA to Prox RCA lesion is 30% stenosed.   2nd Mrg lesion is 50% stenosed.   Mid RCA lesion is 30% stenosed.   Non-stenotic Mid LAD lesion was previously treated. Mild non-obstructive CAD with continued patency of the LAD stent Normal LVEDP Suspect non-cardiac chest pain  Exercise Tolerance Test 05/04/20 Blood pressure demonstrated a normal response to exercise. There was no ST segment deviation noted during stress. ETT with mildly impaired exercise tolerance (4:37); patient described a mild "chest ache" in recovery; no diagnostic ST changes; normal blood pressure response; negative adequate exercise tolerance test.  LHC 01/14/20: 1.  Severe mid LAD stenosis (culprit lesion) treated successfully with PCI using a 3.5 x 16 mm Synergy DES 2.  Mild nonobstructive proximal LAD, mid circumflex, OM 2, and proximal RCA stenoses 3.  Normal LV systolic function with normal LVEDP Recommendations: Aggressive medical therapy for secondary risk reduction.  Tobacco cessation.  The patient is loaded with ticagrelor 180 mg in the Cath Lab.  He is started on a high intensity statin drug and a beta-blocker.  Discussed cardiac catheterization findings with patient and his wife.  If no complications arise, should be eligible for hospital discharge within 24 hours  CTA Chest 12/18/19 1. No thoracic aortic aneurysm or dissection. Mild aortic atherosclerosis as well as foci of coronary artery calcification noted. 2. No pulmonary embolus evident. 3. Underlying emphysematous change. 4. Stable lentiform shaped opacity abutting the pleura in the periphery of the right upper lobe. Stability over time indicative of benign etiology. 5. Small hiatal hernia. 6. No evident thoracic adenopathy. PELVIS: 1. No aneurysm  or dissection involving the aorta, major mesenteric, and major pelvic arterial vessels. Mild atherosclerotic plaque in the distal aorta. No hemodynamically obstructing lesions evident involving the arterial vessels in the abdomen and pelvis. No fibromuscular dysplasia. 2. Scattered colonic diverticula without diverticulitis. No bowel obstruction. No abscess in the abdomen or pelvis. Appendix appears normal. 3. Urinary bladder wall thickening consistent with a degree of cystitis. 4.  No renal or ureteral calculi.  No hydronephrosis  EKG:  EKG was not ordered today 01/20/2020: Sinus arrhythmia.  Rate 82 bpm. 03/03/2019: Sinus arrhythmia.  Rate 73 bpm.  Nonspecific ST abnormalities.  Recent Labs: 09/29/2020: TSH 1.510 12/11/2020: ALT 26; Hemoglobin 15.3; Platelets 196 12/13/2020: BUN 15; Creatinine, Ser 1.24; Magnesium 2.0; Potassium 4.0; Sodium 134  Recent Lipid Panel    Component Value Date/Time   CHOL 151 06/23/2020 1232   TRIG 175 (H) 06/23/2020 1232   HDL 55 06/23/2020 1232   CHOLHDL 2.7 06/23/2020 1232   CHOLHDL 2.7 01/14/2020 0128   VLDL 12 01/14/2020 0128   LDLCALC 67 06/23/2020 1232        Physical Exam:    VS:  BP 112/80 (BP Location: Left Arm, Patient Position: Sitting, Cuff Size: Normal)   Pulse 79   Ht 6' (1.829 m)   Wt 177 lb 14.4 oz (80.7 kg)   BMI 24.13 kg/m  , BMI Body mass index is 24.13 kg/m. GENERAL:  Well appearing HEENT: Pupils equal round and reactive, fundi not visualized, oral mucosa unremarkable NECK:  No jugular venous distention, waveform within normal limits, carotid upstroke brisk and symmetric, no bruits, no thyromegaly LUNGS:  Clear to auscultation bilaterally HEART:  RRR.  PMI not displaced or sustained,S1  and S2 within normal limits, no S3, no S4, no clicks, no rubs, no murmurs ABD:  Flat, positive bowel sounds normal in frequency in pitch, no bruits, no rebound, no guarding, no midline pulsatile mass, no hepatomegaly, no splenomegaly EXT:  2  plus pulses throughout, no edema, no cyanosis no clubbing SKIN:  No rashes no nodules NEURO:  Cranial nerves II through XII grossly intact, motor grossly intact throughout PSYCH:  Cognitively intact, oriented to person place and time  ASSESSMENT:    1. Medication management   2. Pure hypercholesterolemia   3. Coronary atherosclerosis due to lipid rich plaque   4. Tobacco abuse   5. Essential hypertension, benign    PLAN:    Coronary atherosclerosis due to lipid rich plaque He continues to have occasional atypical chest pain.  We discussed the fact that if he is able to walk and run without chest pain that it is not his heart.  I suspect his symptoms are musculoskeletal.  He had a repeat cath 11/2020 that revealed his stent was patent and otherwise stable disease.  Check fasting lipids and CMP today.  Stop clopidogrel.  Continue aspirin, metoprolol, and atorvastatin.  Hyperlipidemia Check fasting lipids and CMP today.  Continue atorvastatin.  Tobacco abuse He is interested in smoking cessation at the beginning of the year.  He has Wellbutrin and uses it only as needed.  He will start back using this and also use nicotine patches 14 mg daily for 2 weeks then 7 mg daily for 6 weeks.  Essential hypertension, benign Blood pressure well-controlled on metoprolol.  He is no longer on losartan.    Medication Adjustments/Labs and Tests Ordered: Current medicines are reviewed at length with the patient today.  Concerns regarding medicines are outlined above.  Orders Placed This Encounter  Procedures   Lipid panel   Comp Met (CMET)    No orders of the defined types were placed in this encounter.   Patient Instructions  Medication Instructions:  STOP- Clopidogrel(Plavix)  *If you need a refill on your cardiac medications before your next appointment, please call your pharmacy*   Lab Work: Fasting Lipid and CMP  If you have labs (blood work) drawn today and your tests are  completely normal, you will receive your results only by: [place] (if you have MyChart) OR A paper copy in the mail If you have any lab test that is abnormal or we need to change your treatment, we will call you to review the results.   Testing/Procedures: None Ordered   Follow-Up: At [facility], you and your health needs are our priority.  As part of our continuing mission to provide you with exceptional heart care, we have created designated Provider Care Teams.  These Care Teams include your primary Cardiologist (physician) and Advanced Practice Providers (APPs -  Physician Assistants and Nurse Practitioners) who all work together to provide you with the care you need, when you need it.  We recommend signing up for the patient portal called "MyChart".  Sign up information is provided on this After Visit Summary.  MyChart is used to connect with patients for Virtual Visits (Telemedicine).  Patients are able to view lab/test results, encounter notes, upcoming appointments, etc.  Non-urgent messages can be sent to your provider as well.   To learn more about what you can do with MyChart, go to [email or web address].    Your next appointment:   6 month(s)  The format for your next appointment:   In Person  Provider:   Skeet Latch, MD      Disposition: FU with [place] Feldpausch C. Oval Linsey, MD, [facility] in 6 months  I,Mykaella Javier,acting as a scribe for Skeet Latch, MD.,have documented all relevant documentation on the behalf of Skeet Latch, MD,as directed by  Skeet Latch, MD while in the presence of Skeet Latch, MD.  I, [place] Oval Linsey, MD have reviewed all documentation for this visit.  The documentation of the exam, diagnosis, procedures, and orders on 02/09/2021 are all accurate and complete.   Signed, Skeet Latch, MD  02/09/2021 11:09 AM    [place]

## 2021-02-09 ENCOUNTER — Other Ambulatory Visit: Payer: Self-pay

## 2021-02-09 ENCOUNTER — Encounter (HOSPITAL_BASED_OUTPATIENT_CLINIC_OR_DEPARTMENT_OTHER): Payer: Self-pay | Admitting: Cardiovascular Disease

## 2021-02-09 ENCOUNTER — Ambulatory Visit (HOSPITAL_BASED_OUTPATIENT_CLINIC_OR_DEPARTMENT_OTHER): Payer: 59 | Admitting: Cardiovascular Disease

## 2021-02-09 VITALS — BP 112/80 | HR 79 | Ht 72.0 in | Wt 177.9 lb

## 2021-02-09 DIAGNOSIS — Z720 Tobacco use: Secondary | ICD-10-CM | POA: Diagnosis not present

## 2021-02-09 DIAGNOSIS — I2510 Atherosclerotic heart disease of native coronary artery without angina pectoris: Secondary | ICD-10-CM

## 2021-02-09 DIAGNOSIS — E7800 Pure hypercholesterolemia, unspecified: Secondary | ICD-10-CM | POA: Diagnosis not present

## 2021-02-09 DIAGNOSIS — I2583 Coronary atherosclerosis due to lipid rich plaque: Secondary | ICD-10-CM | POA: Diagnosis not present

## 2021-02-09 DIAGNOSIS — I10 Essential (primary) hypertension: Secondary | ICD-10-CM | POA: Diagnosis not present

## 2021-02-09 DIAGNOSIS — Z79899 Other long term (current) drug therapy: Secondary | ICD-10-CM | POA: Diagnosis not present

## 2021-02-09 LAB — COMPREHENSIVE METABOLIC PANEL
ALT: 20 IU/L (ref 0–44)
AST: 14 IU/L (ref 0–40)
Albumin/Globulin Ratio: 1.5 (ref 1.2–2.2)
Albumin: 4.1 g/dL (ref 3.8–4.8)
Alkaline Phosphatase: 114 IU/L (ref 44–121)
BUN/Creatinine Ratio: 16 (ref 10–24)
BUN: 18 mg/dL (ref 8–27)
Bilirubin Total: 0.3 mg/dL (ref 0.0–1.2)
CO2: 24 mmol/L (ref 20–29)
Calcium: 9.2 mg/dL (ref 8.6–10.2)
Chloride: 104 mmol/L (ref 96–106)
Creatinine, Ser: 1.1 mg/dL (ref 0.76–1.27)
Globulin, Total: 2.7 g/dL (ref 1.5–4.5)
Glucose: 93 mg/dL (ref 70–99)
Potassium: 4.8 mmol/L (ref 3.5–5.2)
Sodium: 140 mmol/L (ref 134–144)
Total Protein: 6.8 g/dL (ref 6.0–8.5)
eGFR: 73 mL/min/{1.73_m2} (ref >59)

## 2021-02-09 LAB — LIPID PANEL
Chol/HDL Ratio: 2.3 ratio (ref 0.0–5.0)
Cholesterol, Total: 119 mg/dL (ref 100–199)
HDL: 51 mg/dL (ref >39)
LDL Chol Calc (NIH): 50 mg/dL (ref 0–99)
Triglycerides: 94 mg/dL (ref 0–149)
VLDL Cholesterol Cal: 18 mg/dL (ref 5–40)

## 2021-02-09 NOTE — Patient Instructions (Signed)
Medication Instructions:  STOP- Clopidogrel(Plavix)  *If you need a refill on your cardiac medications before your next appointment, please call your pharmacy*   Lab Work: Fasting Lipid and CMP  If you have labs (blood work) drawn today and your tests are completely normal, you will receive your results only by: [place] (if you have MyChart) OR A paper copy in the mail If you have any lab test that is abnormal or we need to change your treatment, we will call you to review the results.   Testing/Procedures: None Ordered   Follow-Up: At [facility], you and your health needs are our priority.  As part of our continuing mission to provide you with exceptional heart care, we have created designated Provider Care Teams.  These Care Teams include your primary Cardiologist (physician) and Advanced Practice Providers (APPs -  Physician Assistants and Nurse Practitioners) who all work together to provide you with the care you need, when you need it.  We recommend signing up for the patient portal called "MyChart".  Sign up information is provided on this After Visit Summary.  MyChart is used to connect with patients for Virtual Visits (Telemedicine).  Patients are able to view lab/test results, encounter notes, upcoming appointments, etc.  Non-urgent messages can be sent to your provider as well.   To learn more about what you can do with MyChart, go to [email or web address].    Your next appointment:   6 month(s)  The format for your next appointment:   In Person  Provider:   Skeet Latch, MD

## 2021-02-09 NOTE — Assessment & Plan Note (Signed)
Blood pressure well-controlled on metoprolol.  He is no longer on losartan.

## 2021-02-09 NOTE — Assessment & Plan Note (Signed)
He is interested in smoking cessation at the beginning of the year.  He has Wellbutrin and uses it only as needed.  He will start back using this and also use nicotine patches 14 mg daily for 2 weeks then 7 mg daily for 6 weeks.

## 2021-02-09 NOTE — Assessment & Plan Note (Signed)
Check fasting lipids and CMP today.  Continue atorvastatin. 

## 2021-02-09 NOTE — Assessment & Plan Note (Signed)
He continues to have occasional atypical chest pain.  We discussed the fact that if he is able to walk and run without chest pain that it is not his heart.  I suspect his symptoms are musculoskeletal.  He had a repeat cath 11/2020 that revealed his stent was patent and otherwise stable disease.  Check fasting lipids and CMP today.  Stop clopidogrel.  Continue aspirin, metoprolol, and atorvastatin.

## 2021-02-22 ENCOUNTER — Other Ambulatory Visit (HOSPITAL_COMMUNITY): Payer: Self-pay

## 2021-02-22 MED FILL — Famotidine Tab 40 MG: ORAL | 30 days supply | Qty: 60 | Fill #2 | Status: AC

## 2021-03-01 ENCOUNTER — Other Ambulatory Visit (HOSPITAL_COMMUNITY): Payer: Self-pay

## 2021-03-02 ENCOUNTER — Telehealth: Payer: Self-pay

## 2021-03-02 NOTE — Telephone Encounter (Signed)
The pt called and wanted to get the name of who he saw at [facility] urology, the pt was told that it was Dr. Dayna Ramus.

## 2021-03-03 NOTE — Telephone Encounter (Signed)
Patient seen by Dr Oval Linsey 12/14

## 2021-03-17 ENCOUNTER — Encounter: Payer: Self-pay | Admitting: Nurse Practitioner

## 2021-03-17 ENCOUNTER — Other Ambulatory Visit: Payer: Self-pay

## 2021-03-17 ENCOUNTER — Ambulatory Visit (INDEPENDENT_AMBULATORY_CARE_PROVIDER_SITE_OTHER): Payer: 59 | Admitting: Nurse Practitioner

## 2021-03-17 VITALS — BP 118/80 | HR 91 | Temp 98.2°F | Ht 69.8 in | Wt 178.2 lb

## 2021-03-17 DIAGNOSIS — M545 Low back pain, unspecified: Secondary | ICD-10-CM | POA: Diagnosis not present

## 2021-03-17 LAB — POCT URINALYSIS DIPSTICK
Bilirubin, UA: NEGATIVE
Blood, UA: NEGATIVE
Glucose, UA: NEGATIVE
Ketones, UA: NEGATIVE
Leukocytes, UA: NEGATIVE
Nitrite, UA: NEGATIVE
Protein, UA: NEGATIVE
Spec Grav, UA: 1.025 (ref 1.010–1.025)
Urobilinogen, UA: 0.2 E.U./dL
pH, UA: 5.5 (ref 5.0–8.0)

## 2021-03-17 MED ORDER — TRIAMCINOLONE ACETONIDE 40 MG/ML IJ SUSP
60.0000 mg | Freq: Once | INTRAMUSCULAR | Status: AC
  Administered 2021-03-17: 60 mg via INTRAMUSCULAR

## 2021-03-17 NOTE — Patient Instructions (Signed)

## 2021-03-17 NOTE — Progress Notes (Signed)
I,Peter Sims,acting as a Education administrator for [employer], NP.,have documented all relevant documentation on the behalf of [employer], NP,as directed by  Peter Castilla, NP while in the presence of Peter Castilla, NP.  This visit occurred during the SARS-CoV-2 public health emergency.  Safety protocols were in place, including screening questions prior to the visit, additional usage of staff PPE, and extensive cleaning of exam room while observing appropriate contact time as indicated for disinfecting solutions.  Subjective:     [name] , male    DOB: [birth year] , 71 y.o.   MRN: [id number]   Chief Complaint  Patient presents with   Back Pain    HPI  Patient is here for back pain. The pain started last night. Denies shortness of breath or chest pain. Denies any other symptoms. He does not have any urinary symptoms. Pain does not radiate anymore.    Back Pain Pertinent negatives include no chest pain, headaches, numbness or weakness.    Past Medical History:  Diagnosis Date   Arthritis    Bullous emphysema (HCC)    Severe Bilateral   Cocaine abuse ([place])    Colitis    with bleeding   COPD (chronic obstructive pulmonary disease) (HCC)    Coronary artery disease    Dyspnea    GERD (gastroesophageal reflux disease)    Headache    sinusitis   Hypertension    LBP (low back pain)    now chronic   Lumbago    Lung mass    Right Upper Lobe pleural based mass, negative  on PET Scan   Neck pain    nerve pain    Pneumonia    hx   S/P angioplasty with stent 01/13/20 emergent DES to mLAD  01/15/2020   Shortness of breath 03/03/2020   Spine pain    nerve   Therapeutic drug monitoring    Tobacco abuse      Family History  Adopted: Yes  Problem Relation Age of Onset   Cancer Father    Lung cancer Father    Heart failure Sister    Breast cancer Sister    Emphysema Mother      Current Outpatient Medications:    acetaminophen (TYLENOL) 325  MG tablet, Take 2 tablets (650 mg total) by mouth every 4 (four) hours as needed for headache or mild pain., Disp: , Rfl:    albuterol (VENTOLIN HFA) 108 (90 Base) MCG/ACT inhaler, INHALE 2 PUFFS INTO THE LUNGS EVERY 6 (SIX) HOURS AS NEEDED FOR WHEEZING OR SHORTNESS OF BREATH., Disp: 8.5 g, Rfl: 2   aspirin 81 MG chewable tablet, Chew 1 tablet (81 mg total) by mouth daily., Disp: , Rfl:    atorvastatin (LIPITOR) 80 MG tablet, Take 1 tablet (80 mg total) by mouth daily., Disp: 90 tablet, Rfl: 1   Budeson-Glycopyrrol-Formoterol (BREZTRI AEROSPHERE) 160-9-4.8 MCG/ACT AERO, Inhale 2 puffs into the lungs 2 (two) times daily., Disp: 10.7 g, Rfl: 2   Carboxymethylcellul-Glycerin (CLEAR EYES FOR DRY EYES OP), Place 1 drop into both eyes daily as needed (for dry eyes)., Disp: , Rfl:    diclofenac Sodium (VOLTAREN) 1 % GEL, Apply 2 g topically 2 (two) times daily as needed (joint pain)., Disp: , Rfl:    famotidine (PEPCID) 40 MG tablet, TAKE 1 TABLET BY MOUTH TWICE DAILY. (Patient taking differently: Take 40 mg by mouth daily.), Disp: 60 tablet, Rfl: 12   ipratropium-albuterol (DUONEB) 0.5-2.5 (3) MG/3ML SOLN, Inhale 1 vial via nebulizer  4 times a day, Disp: 270 mL, Rfl: PRN   metoprolol tartrate (LOPRESSOR) 25 MG tablet, Take 0.5 tablets (12.5 mg total) by mouth 2 (two) times daily., Disp: 90 tablet, Rfl: 2   montelukast (SINGULAIR) 10 MG tablet, TAKE 1 TABLET BY MOUTH EVERY DAY IN THE EVENING (Patient taking differently: Take 10 mg by mouth daily as needed (allergies).), Disp: 30 tablet, Rfl: 3   nicotine (NICODERM CQ - DOSED IN MG/24 HOURS) 14 mg/24hr patch, Place 14 mg onto the skin daily., Disp: , Rfl:    nitroGLYCERIN (NITROSTAT) 0.4 MG SL tablet, Place 1 tablet (0.4 mg total) under the tongue every 5 (five) minutes as needed for chest pain., Disp: 25 tablet, Rfl: 4   pantoprazole (PROTONIX) 40 MG tablet, TAKE 1 TABLET (40 MG TOTAL) BY MOUTH DAILY.STOP OMEPRAZOLE (Patient taking differently: Take 40 mg by  mouth daily.), Disp: 90 tablet, Rfl: 3   pregabalin (LYRICA) 50 MG capsule, Take 1 capsule (50 mg total) by mouth 2 (two) times daily., Disp: 180 capsule, Rfl: 1   Respiratory Therapy Supplies (FLUTTER) DEVI, Use as directed, Disp: 1 each, Rfl: 0   tamsulosin (FLOMAX) 0.4 MG CAPS capsule, Take 1 capsule (0.4 mg total) by mouth daily., Disp: 90 capsule, Rfl: 2  Current Facility-Administered Medications:    triamcinolone acetonide (KENALOG-40) injection 60 mg, 60 mg, Intramuscular, Once, Taksh Hjort, NP   Allergies  Allergen Reactions   Penicillins Anaphylaxis and Hives    Has patient had a PCN reaction causing immediate rash, facial/tongue/throat swelling, SOB or lightheadedness with hypotension: No Has patient had a PCN reaction causing severe rash involving mucus membranes or skin necrosis: No Has patient had a PCN reaction that required hospitalization No Has patient had a PCN reaction occurring within the last 10 years: No If all of the above answers are "NO", then may proceed with Cephalosporin use.       Review of Systems  Constitutional: Negative.   Respiratory: Negative.  Negative for cough, shortness of breath and wheezing.   Cardiovascular: Negative.  Negative for chest pain and palpitations.  Gastrointestinal: Negative.  Negative for constipation and diarrhea.  Endocrine: Negative for polydipsia, polyphagia and polyuria.  Genitourinary:  Negative for urgency.  Musculoskeletal:  Positive for back pain.       Pain in the right lower back   Neurological: Negative.  Negative for weakness, numbness and headaches.    Today's Vitals   03/17/21 1409  BP: 118/80  Pulse: 91  Temp: 98.2 F (36.8 C)  TempSrc: Oral  Weight: 178 lb 3.2 oz (80.8 kg)  Height: 5' 9.8" (1.773 m)   Body mass index is 25.72 kg/m.   Objective:  Physical Exam Constitutional:      Appearance: Normal appearance.  HENT:     Head: Normocephalic and atraumatic.  Cardiovascular:     Rate and  Rhythm: Normal rate and regular rhythm.     Pulses: Normal pulses.     Heart sounds: Normal heart sounds. No murmur heard. Pulmonary:     Effort: Pulmonary effort is normal. No respiratory distress.     Breath sounds: Normal breath sounds. No wheezing.  Musculoskeletal:        General: Tenderness present.     Thoracic back: No swelling or signs of trauma.     Lumbar back: No swelling, edema or tenderness.       Back:     Comments: Tender upon palpitations. No swelling.   Skin:    General: Skin is  warm and dry.     Capillary Refill: Capillary refill takes less than 2 seconds.  Neurological:     Mental Status: He is alert and oriented to person, place, and time.        Assessment And Plan:     1. Acute right-sided low back pain, unspecified whether sciatica present - POCT Urinalysis Dipstick (81002) - CMP14+EGFR - CBC no Diff - triamcinolone acetonide (KENALOG-40) injection 60 mg  - Advised patient if he experiences any worsening of pain to go to the urgent care/ED. Patient verbalized understanding of the plan.   The patient was encouraged to call or send a message through [facility] for any questions or concerns.   Follow up: if symptoms persist or do not get better.   Side effects and appropriate use of all the medication(s) were discussed with the patient today. Patient advised to use the medication(s) as directed by their healthcare provider. The patient was encouraged to read, review, and understand all associated package inserts and contact our office with any questions or concerns. The patient accepts the risks of the treatment plan and had an opportunity to ask questions.   Patient was given opportunity to ask questions. Patient verbalized understanding of the plan and was able to repeat key elements of the plan. All questions were answered to their satisfaction.  Raman Galadriel Shroff, DNP   I, Raman Davonna Ertl have reviewed all documentation for this visit. The documentation on  03/17/21 for the exam, diagnosis, procedures, and orders are all accurate and complete.   IF YOU HAVE BEEN REFERRED TO A SPECIALIST, IT MAY TAKE 1-2 WEEKS TO SCHEDULE/PROCESS THE REFERRAL. IF YOU HAVE NOT HEARD FROM US/SPECIALIST IN TWO WEEKS, PLEASE GIVE [place] A CALL AT [phone].   THE PATIENT IS ENCOURAGED TO PRACTICE SOCIAL DISTANCING DUE TO THE COVID-19 PANDEMIC.

## 2021-03-18 ENCOUNTER — Other Ambulatory Visit: Payer: Self-pay

## 2021-03-18 DIAGNOSIS — R3915 Urgency of urination: Secondary | ICD-10-CM

## 2021-03-18 LAB — CMP14+EGFR
ALT: 21 IU/L (ref 0–44)
AST: 16 IU/L (ref 0–40)
Albumin/Globulin Ratio: 1.6 (ref 1.2–2.2)
Albumin: 4.4 g/dL (ref 3.8–4.8)
Alkaline Phosphatase: 124 IU/L — ABNORMAL HIGH (ref 44–121)
BUN/Creatinine Ratio: 12 (ref 10–24)
BUN: 13 mg/dL (ref 8–27)
Bilirubin Total: 0.4 mg/dL (ref 0.0–1.2)
CO2: 21 mmol/L (ref 20–29)
Calcium: 9.7 mg/dL (ref 8.6–10.2)
Chloride: 106 mmol/L (ref 96–106)
Creatinine, Ser: 1.09 mg/dL (ref 0.76–1.27)
Globulin, Total: 2.7 g/dL (ref 1.5–4.5)
Glucose: 92 mg/dL (ref 70–99)
Potassium: 4.3 mmol/L (ref 3.5–5.2)
Sodium: 142 mmol/L (ref 134–144)
Total Protein: 7.1 g/dL (ref 6.0–8.5)
eGFR: 74 mL/min/{1.73_m2} (ref >59)

## 2021-03-18 LAB — CBC
Hematocrit: 48.4 % (ref 37.5–51.0)
Hemoglobin: 16.6 g/dL (ref 13.0–17.7)
MCH: 28.8 pg (ref 26.6–33.0)
MCHC: 34.3 g/dL (ref 31.5–35.7)
MCV: 84 fL (ref 79–97)
Platelets: 220 10*3/uL (ref 150–450)
RBC: 5.76 x10E6/uL (ref 4.14–5.80)
RDW: 14.1 % (ref 11.6–15.4)
WBC: 7.1 10*3/uL (ref 3.4–10.8)

## 2021-03-22 ENCOUNTER — Other Ambulatory Visit: Payer: Self-pay

## 2021-03-22 ENCOUNTER — Ambulatory Visit: Payer: 59 | Admitting: Internal Medicine

## 2021-03-22 ENCOUNTER — Other Ambulatory Visit (HOSPITAL_COMMUNITY): Payer: Self-pay

## 2021-03-22 ENCOUNTER — Encounter: Payer: Self-pay | Admitting: Internal Medicine

## 2021-03-22 VITALS — BP 102/80 | HR 80 | Temp 97.4°F | Ht 69.8 in | Wt 178.0 lb

## 2021-03-22 DIAGNOSIS — J441 Chronic obstructive pulmonary disease with (acute) exacerbation: Secondary | ICD-10-CM

## 2021-03-22 DIAGNOSIS — R051 Acute cough: Secondary | ICD-10-CM

## 2021-03-22 MED ORDER — TRIAMCINOLONE ACETONIDE 40 MG/ML IJ SUSP: 60.0000 mg | Freq: Once | INTRAMUSCULAR | Status: DC

## 2021-03-22 MED ORDER — AZITHROMYCIN 250 MG PO TABS
ORAL_TABLET | ORAL | 0 refills | Status: AC
  Filled 2021-03-22: qty 6, 5d supply, fill #0

## 2021-03-22 MED ORDER — PREDNISONE 5 MG PO TABS
ORAL_TABLET | ORAL | 0 refills | Status: DC
  Filled 2021-03-22: qty 21, 6d supply, fill #0

## 2021-03-22 NOTE — Progress Notes (Signed)
I,Peter Sims,acting as a Education administrator for Peter Greenland, MD.,have documented all relevant documentation on the behalf of Peter Greenland, MD,as directed by  Peter Greenland, MD while in the presence of Peter Greenland, MD.  This visit occurred during the SARS-CoV-2 public health emergency.  Safety protocols were in place, including screening questions prior to the visit, additional usage of staff PPE, and extensive cleaning of exam room while observing appropriate contact time as indicated for disinfecting solutions.  Subjective:     [name] , male    DOB: 05-18-1950 , 71 y.o.   MRN: [id number]   Chief Complaint  Patient presents with   Wheezing    HPI  Patient is here for wheezing. Patient states that this issue started about a week ago. He does feel that this is getting worse. He denies fever/chills. Denies ill contacts. He is not sure what triggered his sx.     Past Medical History:  Diagnosis Date   Arthritis    Bullous emphysema (HCC)    Severe Bilateral   Cocaine abuse ([place])    Colitis    with bleeding   COPD (chronic obstructive pulmonary disease) (HCC)    Coronary artery disease    Dyspnea    GERD (gastroesophageal reflux disease)    Headache    sinusitis   Hypertension    LBP (low back pain)    now chronic   Lumbago    Lung mass    Right Upper Lobe pleural based mass, negative  on PET Scan   Neck pain    nerve pain    Pneumonia    hx   S/P angioplasty with stent 01/13/20 emergent DES to mLAD  01/15/2020   Shortness of breath 03/03/2020   Spine pain    nerve   Therapeutic drug monitoring    Tobacco abuse      Family History  Adopted: Yes  Problem Relation Age of Onset   Cancer Father    Lung cancer Father    Heart failure Sister    Breast cancer Sister    Emphysema Mother      Current Outpatient Medications:    predniSONE (DELTASONE) 5 MG tablet, Taper over 6 days as directed 6-5-4-3-2-1, Disp: 21 tablet, Rfl: 0    acetaminophen (TYLENOL) 325 MG tablet, Take 2 tablets (650 mg total) by mouth every 4 (four) hours as needed for headache or mild pain., Disp: , Rfl:    albuterol (VENTOLIN HFA) 108 (90 Base) MCG/ACT inhaler, INHALE 2 PUFFS INTO THE LUNGS EVERY 6 (SIX) HOURS AS NEEDED FOR WHEEZING OR SHORTNESS OF BREATH., Disp: 8.5 g, Rfl: 2   aspirin 81 MG chewable tablet, Chew 1 tablet (81 mg total) by mouth daily., Disp: , Rfl:    atorvastatin (LIPITOR) 80 MG tablet, Take 1 tablet (80 mg total) by mouth daily., Disp: 90 tablet, Rfl: 1   Budeson-Glycopyrrol-Formoterol (BREZTRI AEROSPHERE) 160-9-4.8 MCG/ACT AERO, Inhale 2 puffs into the lungs 2 (two) times daily., Disp: 10.7 g, Rfl: 2   Carboxymethylcellul-Glycerin (CLEAR EYES FOR DRY EYES OP), Place 1 drop into both eyes daily as needed (for dry eyes)., Disp: , Rfl:    diclofenac Sodium (VOLTAREN) 1 % GEL, Apply 2 g topically 2 (two) times daily as needed (joint pain)., Disp: , Rfl:    famotidine (PEPCID) 40 MG tablet, TAKE 1 TABLET BY MOUTH TWICE DAILY. (Patient taking differently: Take 40 mg by mouth daily.), Disp: 60 tablet, Rfl: 12  ipratropium-albuterol (DUONEB) 0.5-2.5 (3) MG/3ML SOLN, Inhale 1 vial via nebulizer 4 times a day, Disp: 270 mL, Rfl: PRN   metoprolol tartrate (LOPRESSOR) 25 MG tablet, Take 0.5 tablets (12.5 mg total) by mouth 2 (two) times daily., Disp: 90 tablet, Rfl: 2   montelukast (SINGULAIR) 10 MG tablet, TAKE 1 TABLET BY MOUTH EVERY DAY IN THE EVENING (Patient taking differently: Take 10 mg by mouth daily as needed (allergies).), Disp: 30 tablet, Rfl: 3   nicotine (NICODERM CQ - DOSED IN MG/24 HOURS) 14 mg/24hr patch, Place 14 mg onto the skin daily., Disp: , Rfl:    nitroGLYCERIN (NITROSTAT) 0.4 MG SL tablet, Place 1 tablet (0.4 mg total) under the tongue every 5 (five) minutes as needed for chest pain., Disp: 25 tablet, Rfl: 4   pantoprazole (PROTONIX) 40 MG tablet, TAKE 1 TABLET (40 MG TOTAL) BY MOUTH DAILY.STOP OMEPRAZOLE (Patient taking  differently: Take 40 mg by mouth daily.), Disp: 90 tablet, Rfl: 3   pregabalin (LYRICA) 50 MG capsule, Take 1 capsule (50 mg total) by mouth 2 (two) times daily., Disp: 180 capsule, Rfl: 1   Respiratory Therapy Supplies (FLUTTER) DEVI, Use as directed, Disp: 1 each, Rfl: 0   tamsulosin (FLOMAX) 0.4 MG CAPS capsule, Take 1 capsule (0.4 mg total) by mouth daily., Disp: 90 capsule, Rfl: 2   Allergies  Allergen Reactions   Penicillins Anaphylaxis and Hives    Has patient had a PCN reaction causing immediate rash, facial/tongue/throat swelling, SOB or lightheadedness with hypotension: No Has patient had a PCN reaction causing severe rash involving mucus membranes or skin necrosis: No Has patient had a PCN reaction that required hospitalization No Has patient had a PCN reaction occurring within the last 10 years: No If all of the above answers are "NO", then may proceed with Cephalosporin use.       Review of Systems  Constitutional: Negative.   Respiratory:  Positive for cough and wheezing.   Cardiovascular: Negative.   Gastrointestinal: Negative.   Neurological: Negative.   Psychiatric/Behavioral: Negative.      Today's Vitals   03/22/21 1628  BP: 102/80  Pulse: 80  Temp: (!) 97.4 F (36.3 C)  TempSrc: Oral  SpO2: 98%  Weight: 178 lb (80.7 kg)  Height: 5' 9.8" (1.773 m)   Body mass index is 25.69 kg/m.  Wt Readings from Last 3 Encounters:  03/22/21 178 lb (80.7 kg)  03/17/21 178 lb 3.2 oz (80.8 kg)  02/09/21 177 lb 14.4 oz (80.7 kg)    Objective:  Physical Exam Vitals and nursing note reviewed.  Constitutional:      Appearance: Normal appearance.  HENT:     Head: Normocephalic and atraumatic.     Nose:     Comments: Masked     Mouth/Throat:     Comments: Masked  Eyes:     Extraocular Movements: Extraocular movements intact.  Cardiovascular:     Rate and Rhythm: Normal rate and regular rhythm.     Heart sounds: Normal heart sounds.  Pulmonary:     Effort:  Pulmonary effort is normal.     Breath sounds: Wheezing and rhonchi present.  Musculoskeletal:     Cervical back: Normal range of motion.  Skin:    General: Skin is warm.  Neurological:     General: No focal deficit present.     Mental Status: He is alert.  Psychiatric:        Mood and Affect: Mood normal.     Assessment And Plan:  1. COPD exacerbation ([place]) Comments: He was given Kenalog, 60mg  IM x 1. Advised to avoid dairy products. I will send rx Zpak along with prednisone6-day taper.  2. Acute cough Comments: He agrees to COVID testing.  - Novel Coronavirus, NAA ([facility])   Patient was given opportunity to ask questions. Patient verbalized understanding of the plan and was able to repeat key elements of the plan. All questions were answered to their satisfaction.   I, Peter Greenland, MD, have reviewed all documentation for this visit. The documentation on 04/03/21 for the exam, diagnosis, procedures, and orders are all accurate and complete.   IF YOU HAVE BEEN REFERRED TO A SPECIALIST, IT MAY TAKE 1-2 WEEKS TO SCHEDULE/PROCESS THE REFERRAL. IF YOU HAVE NOT HEARD FROM US/SPECIALIST IN TWO WEEKS, PLEASE GIVE [place] A CALL AT [phone].   THE PATIENT IS ENCOURAGED TO PRACTICE SOCIAL DISTANCING DUE TO THE COVID-19 PANDEMIC.

## 2021-03-22 NOTE — Patient Instructions (Signed)
Bronchospasm, Adult Bronchospasm is a tightening of the smooth muscle that wraps around the small airways in the lungs. When the muscle tightens, the small airways narrow. Narrowed airways limit the air you breathe in or out of your lungs. Inflammation (swelling) and more mucus (sputum) than usual can further irritate the airways. This can make it very hard to breathe. Bronchospasm can happen suddenly or over a period of time. What are the causes? Common causes of this condition include: An infection, such as a cold or sinus drainage. Exercise. Strong odors from aerosol sprays, and fumes from perfume, candles, and household cleaners. Cold air. Stress or strong emotions such as crying or laughing. What increases the risk? The following factors may make you more likely to develop this condition: Having asthma. Smoking or being around someone who smokes (secondhand smoke). Seasonal allergies, such as pollen or mold. Allergic reaction (anaphylaxis) to food, medicine, or insect bites or stings. What are the signs or symptoms? Symptoms of this condition include: Making a high-pitched whistling sound when you breathe, most often when you breathe out (wheezing). Coughing. Chest tightness. Shortness of breath. Decreased ability to exercise. Noisy breathing or a high-pitched cough. How is this diagnosed? This condition may be diagnosed based on your medical history and a physical exam. Your health care provider may also perform tests, including: A chest X-ray. Lung function tests. How is this treated? This condition may be treated by: Using inhaled medicines. These open up (relax) the airways and help you breathe. They can be taken with a metered dose inhaler or a nebulizer device. Taking corticosteroid medicines. These may be given to reduce inflammation and swelling. Removing the irritant or trigger that started the bronchospasm. Follow these instructions at home: Medicines Take  over-the-counter and prescription medicines only as told by your health care provider. If you need to use an inhaler or nebulizer to take your medicine, ask your health care provider how to use it correctly. You may be given a spacer to use with your inhaler. This makes it easier to get the medicine from the inhaler into your lungs. Lifestyle Do not use any products that contain nicotine or tobacco. These products include cigarettes, chewing tobacco, and vaping devices, such as e-cigarettes. If you need help quitting, ask your health care provider. Keep track of things that trigger your bronchospasm. Avoid these if possible. When pollen, air pollution, or humidity levels are bad, keep windows closed and use an air conditioner or go to places that have air conditioning. Find ways to manage stress and your emotions, such as mindfulness, relaxation, or breathing exercises. Activity Some people have bronchospasm when they exercise. This is called exercise-induced bronchoconstriction (EIB). If you have this problem, talk with your health care provider about how to manage EIB. Some tips include: Using your fast-acting inhaler before exercise. Exercising indoors if it is very cold or humid, or if the pollen and mold counts are high. Warming up and cool down before and after exercise. Stopping exercising right away if your symptoms start or get worse. General instructions If you have asthma, make sure you have an asthma action plan. Stay up to date on your immunizations. Keep all follow-up visits. This is important. Get help right away if: You have trouble breathing. Your wheezing and coughing do not get better after taking your medicine. You have chest pain. You have trouble speaking more than one-word sentences. These symptoms may be an emergency. Get help right away. Call 911. Do not wait to see  if the symptoms will go away. Do not drive yourself to the hospital. Summary Bronchospasm is a  tightening of the smooth muscle that wraps around the small airways in the lungs. Some people have bronchospasm when they exercise. This is called exercise-induced bronchoconstriction (EIB). If you have this problem, talk with your health care provider about how to manage EIB. Do not use any products that contain nicotine or tobacco. These products include cigarettes, chewing tobacco, and vaping devices, such as e-cigarettes. If you need help quitting, ask your health care provider. Get help right away if your wheezing and coughing do not get better after taking your medicine. This information is not intended to replace advice given to you by your health care provider. Make sure you discuss any questions you have with your health care provider. Document Revised: 09/06/2020 Document Reviewed: 09/06/2020 [employer] Patient Education  [employer] [place].

## 2021-03-23 LAB — SARS-COV-2, NAA 2 DAY TAT

## 2021-03-23 LAB — NOVEL CORONAVIRUS, NAA: SARS-CoV-2, NAA: NOT DETECTED

## 2021-03-28 ENCOUNTER — Other Ambulatory Visit (HOSPITAL_COMMUNITY): Payer: Self-pay

## 2021-03-29 ENCOUNTER — Other Ambulatory Visit (HOSPITAL_COMMUNITY): Payer: Self-pay

## 2021-04-04 MED ORDER — TRIAMCINOLONE ACETONIDE 40 MG/ML IJ SUSP
60.0000 mg | Freq: Once | INTRAMUSCULAR | Status: AC
  Administered 2021-03-22: 60 mg via INTRAMUSCULAR

## 2021-04-04 NOTE — Addendum Note (Signed)
Addended by: Roxine Caddy on: 04/04/2021 01:44 PM   Modules accepted: Orders

## 2021-04-06 ENCOUNTER — Ambulatory Visit: Payer: Medicare Other | Admitting: Internal Medicine

## 2021-04-07 ENCOUNTER — Other Ambulatory Visit (HOSPITAL_COMMUNITY): Payer: Self-pay

## 2021-04-07 ENCOUNTER — Other Ambulatory Visit: Payer: Self-pay | Admitting: Cardiovascular Disease

## 2021-04-07 ENCOUNTER — Other Ambulatory Visit: Payer: Self-pay | Admitting: Internal Medicine

## 2021-04-07 DIAGNOSIS — N401 Enlarged prostate with lower urinary tract symptoms: Secondary | ICD-10-CM

## 2021-04-07 MED ORDER — TAMSULOSIN HCL 0.4 MG PO CAPS
0.4000 mg | ORAL_CAPSULE | Freq: Every day | ORAL | 2 refills | Status: DC
  Filled 2021-04-07: qty 90, 90d supply, fill #0
  Filled 2021-06-30: qty 90, 90d supply, fill #1
  Filled 2021-10-03: qty 90, 90d supply, fill #2

## 2021-04-07 MED ORDER — PANTOPRAZOLE SODIUM 40 MG PO TBEC
40.0000 mg | DELAYED_RELEASE_TABLET | ORAL | 3 refills | Status: DC
  Filled 2021-04-07: qty 90, 90d supply, fill #0
  Filled 2021-10-03: qty 90, 90d supply, fill #1

## 2021-04-07 MED ORDER — BUPROPION HCL ER (XL) 150 MG PO TB24
150.0000 mg | ORAL_TABLET | Freq: Every day | ORAL | 1 refills | Status: DC
  Filled 2021-04-07: qty 90, 90d supply, fill #0

## 2021-04-07 NOTE — Telephone Encounter (Signed)
Refill Flomax 

## 2021-04-19 ENCOUNTER — Other Ambulatory Visit (HOSPITAL_COMMUNITY): Payer: Self-pay

## 2021-04-19 DIAGNOSIS — R3914 Feeling of incomplete bladder emptying: Secondary | ICD-10-CM | POA: Diagnosis not present

## 2021-04-19 DIAGNOSIS — R3912 Poor urinary stream: Secondary | ICD-10-CM | POA: Diagnosis not present

## 2021-04-19 DIAGNOSIS — N401 Enlarged prostate with lower urinary tract symptoms: Secondary | ICD-10-CM | POA: Diagnosis not present

## 2021-04-19 MED ORDER — MELOXICAM 15 MG PO TABS
15.0000 mg | ORAL_TABLET | Freq: Every day | ORAL | 0 refills | Status: DC
  Filled 2021-04-19: qty 45, 45d supply, fill #0

## 2021-04-19 MED ORDER — TAMSULOSIN HCL 0.4 MG PO CAPS
0.8000 mg | ORAL_CAPSULE | Freq: Every day | ORAL | 11 refills | Status: DC
  Filled 2021-04-19 – 2022-01-12 (×2): qty 60, 30d supply, fill #0
  Filled 2022-03-06: qty 60, 30d supply, fill #1

## 2021-04-20 ENCOUNTER — Ambulatory Visit: Payer: Medicare Other | Admitting: Internal Medicine

## 2021-05-24 ENCOUNTER — Other Ambulatory Visit (HOSPITAL_COMMUNITY): Payer: Self-pay

## 2021-05-24 ENCOUNTER — Other Ambulatory Visit: Payer: Self-pay | Admitting: Internal Medicine

## 2021-05-24 MED ORDER — MELOXICAM 15 MG PO TABS
15.0000 mg | ORAL_TABLET | Freq: Every day | ORAL | 0 refills | Status: DC
  Filled 2021-05-24: qty 45, 45d supply, fill #0

## 2021-05-25 ENCOUNTER — Other Ambulatory Visit (HOSPITAL_COMMUNITY): Payer: Self-pay

## 2021-05-27 ENCOUNTER — Other Ambulatory Visit (HOSPITAL_COMMUNITY): Payer: Self-pay

## 2021-05-27 MED ORDER — BREZTRI AEROSPHERE 160-9-4.8 MCG/ACT IN AERO
2.0000 | INHALATION_SPRAY | Freq: Two times a day (BID) | RESPIRATORY_TRACT | 2 refills | Status: DC
  Filled 2021-05-27: qty 10.7, 30d supply, fill #0
  Filled 2021-10-03: qty 10.7, 30d supply, fill #1
  Filled 2022-02-07: qty 10.7, 30d supply, fill #2

## 2021-05-27 MED ORDER — MONTELUKAST SODIUM 10 MG PO TABS
10.0000 mg | ORAL_TABLET | Freq: Every evening | ORAL | 11 refills | Status: DC
  Filled 2021-05-27: qty 30, 30d supply, fill #0

## 2021-05-27 MED ORDER — FAMOTIDINE 40 MG PO TABS
40.0000 mg | ORAL_TABLET | Freq: Two times a day (BID) | ORAL | 12 refills | Status: DC
  Filled 2021-05-27: qty 60, 30d supply, fill #0
  Filled 2021-08-31: qty 60, 30d supply, fill #1
  Filled 2022-01-12: qty 60, 30d supply, fill #2
  Filled 2022-04-10: qty 60, 30d supply, fill #3

## 2021-06-16 ENCOUNTER — Encounter (HOSPITAL_BASED_OUTPATIENT_CLINIC_OR_DEPARTMENT_OTHER): Payer: Self-pay | Admitting: Cardiovascular Disease

## 2021-06-20 ENCOUNTER — Other Ambulatory Visit (HOSPITAL_COMMUNITY): Payer: Self-pay

## 2021-06-23 ENCOUNTER — Other Ambulatory Visit (HOSPITAL_COMMUNITY): Payer: Self-pay

## 2021-06-23 ENCOUNTER — Other Ambulatory Visit: Payer: Self-pay | Admitting: Cardiovascular Disease

## 2021-06-23 MED ORDER — METOPROLOL TARTRATE 25 MG PO TABS
12.5000 mg | ORAL_TABLET | Freq: Two times a day (BID) | ORAL | 1 refills | Status: DC
  Filled 2021-06-23: qty 90, 90d supply, fill #0
  Filled 2021-10-03: qty 90, 90d supply, fill #1

## 2021-06-23 NOTE — Telephone Encounter (Signed)
Rx(s) sent to pharmacy electronically.  

## 2021-06-28 ENCOUNTER — Ambulatory Visit (INDEPENDENT_AMBULATORY_CARE_PROVIDER_SITE_OTHER): Payer: 59 | Admitting: Internal Medicine

## 2021-06-28 ENCOUNTER — Encounter: Payer: Self-pay | Admitting: Internal Medicine

## 2021-06-28 VITALS — BP 124/70 | HR 82 | Temp 97.9°F | Ht 70.6 in | Wt 180.0 lb

## 2021-06-28 DIAGNOSIS — M545 Low back pain, unspecified: Secondary | ICD-10-CM

## 2021-06-28 DIAGNOSIS — Z79899 Other long term (current) drug therapy: Secondary | ICD-10-CM | POA: Diagnosis not present

## 2021-06-28 DIAGNOSIS — I2510 Atherosclerotic heart disease of native coronary artery without angina pectoris: Secondary | ICD-10-CM

## 2021-06-28 DIAGNOSIS — I2583 Coronary atherosclerosis due to lipid rich plaque: Secondary | ICD-10-CM | POA: Diagnosis not present

## 2021-06-28 DIAGNOSIS — Z0000 Encounter for general adult medical examination without abnormal findings: Secondary | ICD-10-CM | POA: Diagnosis not present

## 2021-06-28 DIAGNOSIS — J449 Chronic obstructive pulmonary disease, unspecified: Secondary | ICD-10-CM

## 2021-06-28 DIAGNOSIS — I119 Hypertensive heart disease without heart failure: Secondary | ICD-10-CM

## 2021-06-28 DIAGNOSIS — Z6825 Body mass index (BMI) 25.0-25.9, adult: Secondary | ICD-10-CM | POA: Diagnosis not present

## 2021-06-28 DIAGNOSIS — I700 Atherosclerosis of aorta: Secondary | ICD-10-CM

## 2021-06-28 LAB — POCT URINALYSIS DIPSTICK
Bilirubin, UA: NEGATIVE
Blood, UA: NEGATIVE
Glucose, UA: NEGATIVE
Ketones, UA: NEGATIVE
Leukocytes, UA: NEGATIVE
Nitrite, UA: NEGATIVE
Protein, UA: NEGATIVE
Spec Grav, UA: 1.02 (ref 1.010–1.025)
Urobilinogen, UA: 0.2 E.U./dL
pH, UA: 5.5 (ref 5.0–8.0)

## 2021-06-28 MED ORDER — TRIAMCINOLONE ACETONIDE 40 MG/ML IJ SUSP
40.0000 mg | Freq: Once | INTRAMUSCULAR | Status: AC
  Administered 2021-06-28: 40 mg via INTRAMUSCULAR

## 2021-06-28 NOTE — Patient Instructions (Signed)
Health Maintenance, Male Adopting a healthy lifestyle and getting preventive care are important in promoting health and wellness. Ask your health care provider about: The right schedule for you to have regular tests and exams. Things you can do on your own to prevent diseases and keep yourself healthy. What should I know about diet, weight, and exercise? Eat a healthy diet  Eat a diet that includes plenty of vegetables, fruits, low-fat dairy products, and lean protein. Do not eat a lot of foods that are high in solid fats, added sugars, or sodium. Maintain a healthy weight Body mass index (BMI) is a measurement that can be used to identify possible weight problems. It estimates body fat based on height and weight. Your health care provider can help determine your BMI and help you achieve or maintain a healthy weight. Get regular exercise Get regular exercise. This is one of the most important things you can do for your health. Most adults should: Exercise for at least 150 minutes each week. The exercise should increase your heart rate and make you sweat (moderate-intensity exercise). Do strengthening exercises at least twice a week. This is in addition to the moderate-intensity exercise. Spend less time sitting. Even light physical activity can be beneficial. Watch cholesterol and blood lipids Have your blood tested for lipids and cholesterol at 69 years of age, then have this test every 5 years. You may need to have your cholesterol levels checked more often if: Your lipid or cholesterol levels are high. You are older than 69 years of age. You are at high risk for heart disease. What should I know about cancer screening? Many types of cancers can be detected early and may often be prevented. Depending on your health history and family history, you may need to have cancer screening at various ages. This may include screening for: Colorectal cancer. Prostate cancer. Skin cancer. Lung  cancer. What should I know about heart disease, diabetes, and high blood pressure? Blood pressure and heart disease High blood pressure causes heart disease and increases the risk of stroke. This is more likely to develop in people who have high blood pressure readings or are overweight. Talk with your health care provider about your target blood pressure readings. Have your blood pressure checked: Every 3-5 years if you are 18-39 years of age. Every year if you are 40 years old or older. If you are between the ages of 65 and 75 and are a current or former smoker, ask your health care provider if you should have a one-time screening for abdominal aortic aneurysm (AAA). Diabetes Have regular diabetes screenings. This checks your fasting blood sugar level. Have the screening done: Once every three years after age 45 if you are at a normal weight and have a low risk for diabetes. More often and at a younger age if you are overweight or have a high risk for diabetes. What should I know about preventing infection? Hepatitis B If you have a higher risk for hepatitis B, you should be screened for this virus. Talk with your health care provider to find out if you are at risk for hepatitis B infection. Hepatitis C Blood testing is recommended for: Everyone born from 1945 through 1965. Anyone with known risk factors for hepatitis C. Sexually transmitted infections (STIs) You should be screened each year for STIs, including gonorrhea and chlamydia, if: You are sexually active and are younger than 69 years of age. You are older than 69 years of age and your   health care provider tells you that you are at risk for this type of infection. Your sexual activity has changed since you were last screened, and you are at increased risk for chlamydia or gonorrhea. Ask your health care provider if you are at risk. Ask your health care provider about whether you are at high risk for HIV. Your health care provider  may recommend a prescription medicine to help prevent HIV infection. If you choose to take medicine to prevent HIV, you should first get tested for HIV. You should then be tested every 3 months for as long as you are taking the medicine. Follow these instructions at home: Alcohol use Do not drink alcohol if your health care provider tells you not to drink. If you drink alcohol: Limit how much you have to 0-2 drinks a day. Know how much alcohol is in your drink. In the [place] one drink equals one 12 oz bottle of beer (355 mL), one 5 oz glass of wine (148 mL), or one 1 oz glass of hard liquor (44 mL). Lifestyle Do not use any products that contain nicotine or tobacco. These products include cigarettes, chewing tobacco, and vaping devices, such as e-cigarettes. If you need help quitting, ask your health care provider. Do not use street drugs. Do not share needles. Ask your health care provider for help if you need support or information about quitting drugs. General instructions Schedule regular health, dental, and eye exams. Stay current with your vaccines. Tell your health care provider if: You often feel depressed. You have ever been abused or do not feel safe at home. Summary Adopting a healthy lifestyle and getting preventive care are important in promoting health and wellness. Follow your health care provider's instructions about healthy diet, exercising, and getting tested or screened for diseases. Follow your health care provider's instructions on monitoring your cholesterol and blood pressure. This information is not intended to replace advice given to you by your health care provider. Make sure you discuss any questions you have with your health care provider. Document Revised: 07/05/2020 Document Reviewed: 07/05/2020 Elsevier Patient Education  2023 Elsevier Inc.  

## 2021-06-28 NOTE — Progress Notes (Signed)
?I,Victoria T Hamilton,acting as a scribe for [name], MD.,have documented all relevant documentation on the behalf of [name], MD,as directed by  [name], MD while in the presence of [name], MD.  ?This visit occurred during the SARS-CoV-2 public health emergency.  Safety protocols were in place, including screening questions prior to the visit, additional usage of staff PPE, and extensive cleaning of exam room while observing appropriate contact time as indicated for disinfecting solutions. ? ?Subjective:  ?  ? Patient ID: BUELL PARCEL , male    DOB: 06-11-1950 , 71 y.o.   MRN: [id number] ? ? ?Chief Complaint  ?Patient presents with  ? Annual Exam  ? Hypertension  ? ? ?HPI ? ?He is here today for a full physical examination. He reports compliance with meds. He is tired of following a heart healthy diet. He denies headache, chest pain and shortness of breath. He reports he has a unproductive cough that has been ongoing for 3 weeks. ? ?Hypertension ?This is a chronic problem. The current episode started more than 1 year ago. The problem has been gradually improving since onset. The problem is controlled. Pertinent negatives include no blurred vision, chest pain, palpitations or shortness of breath. Risk factors for coronary artery disease include smoking/tobacco exposure and sedentary lifestyle. Past treatments include angiotensin blockers. The current treatment provides moderate improvement.   ? ?Past Medical History:  ?Diagnosis Date  ? Arthritis   ? 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 Cocaine abuse ([place])   ? Colitis   ? with bleeding  ? COPD (chronic obstructive pulmonary disease) ([place])   ? Coronary artery disease   ? Dyspnea   ? GERD (gastroesophageal reflux disease)   ? Headache   ? sinusitis  ? Hypertension   ? LBP (low back pain)   ? now chronic  ? Lumbago   ? Lung mass   ? Right Upper Lobe pleural based mass, negative  on PET Scan  ? Neck pain   ? nerve pain    ? Pneumonia   ? hx  ? S/P angioplasty with stent 01/13/20 emergent DES to mLAD  01/15/2020  ? Shortness of breath 03/03/2020  ? Spine pain   ? nerve  ? Therapeutic drug monitoring   ? Tobacco abuse   ?  ? ?Family History  ?Adopted: Yes  ?Problem Relation Age of Onset  ? Cancer Father   ? Lung cancer Father   ? Heart failure Sister   ? Breast cancer Sister   ? Emphysema Mother   ? ? ? ?Current Outpatient Medications:  ?  acetaminophen (TYLENOL) 325 MG tablet, Take 2 tablets (650 mg total) by mouth every 4 (four) hours as needed for headache or mild pain., Disp: , Rfl:  ?  albuterol (VENTOLIN HFA) 108 (90 Base) MCG/ACT inhaler, INHALE 2 PUFFS INTO THE LUNGS EVERY 6 (SIX) HOURS AS NEEDED FOR WHEEZING OR SHORTNESS OF BREATH., Disp: 8.5 g, Rfl: 2 ?  aspirin 81 MG chewable tablet, Chew 1 tablet (81 mg total) by mouth daily., Disp: , Rfl:  ?  atorvastatin (LIPITOR) 80 MG tablet, Take 1 tablet (80 mg total) by mouth daily., Disp: 90 tablet, Rfl: 1 ?  Budeson-Glycopyrrol-Formoterol (BREZTRI AEROSPHERE) 160-9-4.8 MCG/ACT AERO, Inhale 2 puffs into the lungs 2 (two) times daily., Disp: 10.7 g, Rfl: 2 ?  buPROPion (WELLBUTRIN XL) 150 MG 24 hr tablet, Take 1 tablet (150 mg total) by mouth daily., Disp: 90  tablet, Rfl: 1 ?  diclofenac Sodium (VOLTAREN) 1 % GEL, Apply 2 g topically 2 (two) times daily as needed (joint pain)., Disp: , Rfl:  ?  famotidine (PEPCID) 40 MG tablet, Take 1 tablet (40 mg total) by mouth 2 (two) times daily., Disp: 60 tablet, Rfl: 12 ?  ipratropium-albuterol (DUONEB) 0.5-2.5 (3) MG/3ML SOLN, Inhale 1 vial via nebulizer 4 times a day, Disp: 270 mL, Rfl: PRN ?  meloxicam (MOBIC) 15 MG tablet, Take 1 tablet (15 mg total) by mouth daily., Disp: 45 tablet, Rfl: 0 ?  metoprolol tartrate (LOPRESSOR) 25 MG tablet, Take 0.5 tablets (12.5 mg total) by mouth 2 (two) times daily., Disp: 90 tablet, Rfl: 1 ?  montelukast (SINGULAIR) 10 MG tablet, Take 1 tablet (10 mg total) by mouth every evening., Disp: 30 tablet, Rfl:  11 ?  nicotine (NICODERM CQ - DOSED IN MG/24 HOURS) 14 mg/24hr patch, Place 14 mg onto the skin daily., Disp: , Rfl:  ?  nitroGLYCERIN (NITROSTAT) 0.4 MG SL tablet, Place 1 tablet (0.4 mg total) under the tongue every 5 (five) minutes as needed for chest pain., Disp: 25 tablet, Rfl: 4 ?  pantoprazole (PROTONIX) 40 MG tablet, TAKE 1 TABLET (40 MG TOTAL) BY MOUTH DAILY. STOP OMEPRAZOLE, Disp: 90 tablet, Rfl: 3 ?  pregabalin (LYRICA) 50 MG capsule, Take 1 capsule (50 mg total) by mouth 2 (two) times daily., Disp: 180 capsule, Rfl: 1 ?  Respiratory Therapy Supplies (FLUTTER) DEVI, Use as directed, Disp: 1 each, Rfl: 0 ?  tamsulosin (FLOMAX) 0.4 MG CAPS capsule, Take 1 capsule (0.4 mg total) by mouth daily., Disp: 90 capsule, Rfl: 2 ?  tamsulosin (FLOMAX) 0.4 MG CAPS capsule, Take 2 capsules (0.8 mg total) by mouth daily., Disp: 60 capsule, Rfl: 11 ?  Carboxymethylcellul-Glycerin (CLEAR EYES FOR DRY EYES OP), Place 1 drop into both eyes daily as needed (for dry eyes). (Patient not taking: Reported on 06/28/2021), Disp: , Rfl:  ?  predniSONE (DELTASONE) 5 MG tablet, Taper over 6 days as directed 6-5-4-3-2-1 (Patient not taking: Reported on 06/28/2021), Disp: 21 tablet, Rfl: 0  ? ?Allergies  ?Allergen Reactions  ? Penicillins Anaphylaxis and Hives  ?  Has patient had a PCN reaction causing immediate rash, facial/tongue/throat swelling, SOB or lightheadedness with hypotension: No ?Has patient had a PCN reaction causing severe rash involving mucus membranes or skin necrosis: No ?Has patient had a PCN reaction that required hospitalization No ?Has patient had a PCN reaction occurring within the last 10 years: No ?If all of the above answers are "NO", then may proceed with Cephalosporin use. ? ?  ?  ? ?Men's preventive visit. Patient Health Questionnaire (PHQ-2) is  ?Flowsheet Row CARDIAC REHAB PHASE II EXERCISE from 07/16/2020 in [place]  ?PHQ-2 Total Score 0  ? ?  ?. Patient is on a heart  healthy diet. Marital status: Married. Relevant history for alcohol use is:  ?Social History  ? ?Substance and Sexual Activity  ?Alcohol Use Yes  ? Comment: scotch once a week  ?Marland Kitchen Relevant history for tobacco use is:  ?Social History  ? ?Tobacco Use  ?Smoking Status Every Day  ? Packs/day: 0.50  ? Years: 45.00  ? Pack years: 22.50  ? Types: Cigarettes  ?Smokeless Tobacco Never  ?Tobacco Comments  ? 04/22/20 Patient given 1-800-quit-now  and the link to [facility] health virtual smoking cessation link  ?.  ? ?Review of Systems  ?Constitutional: Negative.   ?HENT: Negative.    ?  Eyes: Negative.  Negative for blurred vision.  ?Respiratory:  Positive for cough. Negative for shortness of breath.   ?Cardiovascular:  Negative for chest pain and palpitations.  ?Gastrointestinal: Negative.   ?Endocrine: Negative.   ?Genitourinary: Negative.   ?Musculoskeletal:  Positive for back pain.  ?     Denies LE weakness/paresthesias  ?Skin: Negative.   ?Allergic/Immunologic: Negative.   ?Neurological: Negative.   ?Hematological: Negative.   ?Psychiatric/Behavioral: Negative.     ? ?Today's Vitals  ? 06/28/21 1107  ?BP: 124/70  ?Pulse: 82  ?Temp: 97.9 ?F (36.6 ?C)  ?SpO2: 96%  ?Weight: 180 lb (81.6 kg)  ?Height: 5' 10.6" (1.793 m)  ?PainSc: 0-No pain  ? ?Body mass index is 25.39 kg/m?.  ?Wt Readings from Last 3 Encounters:  ?06/28/21 180 lb (81.6 kg)  ?03/22/21 178 lb (80.7 kg)  ?03/17/21 178 lb 3.2 oz (80.8 kg)  ?  ?Objective:  ?Physical Exam ?Vitals and nursing note reviewed.  ?Constitutional:   ?   Appearance: Normal appearance.  ?HENT:  ?   Head: Normocephalic and atraumatic.  ?   Right Ear: Tympanic membrane, ear canal and external ear normal.  ?   Left Ear: Tympanic membrane, ear canal and external ear normal.  ?   Nose: Nose normal.  ?   Mouth/Throat:  ?   Mouth: Mucous membranes are moist.  ?   Pharynx: Oropharynx is clear.  ?Eyes:  ?   Extraocular Movements: Extraocular movements intact.  ?   Conjunctiva/sclera: Conjunctivae normal.   ?   Pupils: Pupils are equal, round, and reactive to light.  ?Cardiovascular:  ?   Rate and Rhythm: Normal rate and regular rhythm.  ?   Heart sounds: Normal heart sounds.  ?Pulmonary:  ?   Effort: Pulmonar

## 2021-06-29 LAB — CMP14+EGFR
ALT: 30 IU/L (ref 0–44)
AST: 24 IU/L (ref 0–40)
Albumin/Globulin Ratio: 1.6 (ref 1.2–2.2)
Albumin: 4.6 g/dL (ref 3.8–4.8)
Alkaline Phosphatase: 115 IU/L (ref 44–121)
BUN/Creatinine Ratio: 16 (ref 10–24)
BUN: 18 mg/dL (ref 8–27)
Bilirubin Total: 0.5 mg/dL (ref 0.0–1.2)
CO2: 28 mmol/L (ref 20–29)
Calcium: 10 mg/dL (ref 8.6–10.2)
Chloride: 103 mmol/L (ref 96–106)
Creatinine, Ser: 1.11 mg/dL (ref 0.76–1.27)
Globulin, Total: 2.9 g/dL (ref 1.5–4.5)
Glucose: 86 mg/dL (ref 70–99)
Potassium: 4.7 mmol/L (ref 3.5–5.2)
Sodium: 143 mmol/L (ref 134–144)
Total Protein: 7.5 g/dL (ref 6.0–8.5)
eGFR: 72 mL/min/{1.73_m2} (ref >59)

## 2021-06-29 LAB — MICROALBUMIN / CREATININE URINE RATIO
Creatinine, Urine: 122.4 mg/dL
Microalb/Creat Ratio: 12 mg/g creat (ref 0–29)
Microalbumin, Urine: 15.1 ug/mL

## 2021-06-29 LAB — HEMOGLOBIN A1C
Est. average glucose Bld gHb Est-mCnc: 137 mg/dL
Hgb A1c MFr Bld: 6.4 % — ABNORMAL HIGH (ref 4.8–5.6)

## 2021-06-29 LAB — PSA: Prostate Specific Ag, Serum: 0.7 ng/mL (ref 0.0–4.0)

## 2021-06-29 LAB — VITAMIN B12: Vitamin B-12: 507 pg/mL (ref 232–1245)

## 2021-06-29 LAB — INSULIN, RANDOM: INSULIN: 17 u[IU]/mL (ref 2.6–24.9)

## 2021-06-30 ENCOUNTER — Other Ambulatory Visit (HOSPITAL_COMMUNITY): Payer: Self-pay

## 2021-07-01 ENCOUNTER — Other Ambulatory Visit (HOSPITAL_COMMUNITY): Payer: Self-pay

## 2021-07-13 ENCOUNTER — Other Ambulatory Visit: Payer: Self-pay | Admitting: Internal Medicine

## 2021-07-13 ENCOUNTER — Other Ambulatory Visit (HOSPITAL_COMMUNITY): Payer: Self-pay

## 2021-07-13 MED ORDER — DICLOFENAC SODIUM 1 % EX GEL
2.0000 g | Freq: Two times a day (BID) | CUTANEOUS | 2 refills | Status: DC | PRN
  Filled 2021-07-13: qty 100, 15d supply, fill #0
  Filled 2021-10-21: qty 100, 15d supply, fill #1

## 2021-08-11 ENCOUNTER — Ambulatory Visit (HOSPITAL_BASED_OUTPATIENT_CLINIC_OR_DEPARTMENT_OTHER): Payer: 59 | Admitting: Cardiovascular Disease

## 2021-08-31 ENCOUNTER — Other Ambulatory Visit: Payer: Self-pay | Admitting: Cardiovascular Disease

## 2021-08-31 ENCOUNTER — Other Ambulatory Visit (HOSPITAL_COMMUNITY): Payer: Self-pay

## 2021-08-31 ENCOUNTER — Other Ambulatory Visit: Payer: Self-pay

## 2021-08-31 ENCOUNTER — Telehealth: Payer: Self-pay

## 2021-08-31 MED ORDER — ATORVASTATIN CALCIUM 80 MG PO TABS
80.0000 mg | ORAL_TABLET | Freq: Every day | ORAL | 1 refills | Status: DC
  Filled 2021-08-31: qty 90, 90d supply, fill #0

## 2021-08-31 MED ORDER — ATORVASTATIN CALCIUM 80 MG PO TABS
80.0000 mg | ORAL_TABLET | Freq: Every day | ORAL | 1 refills | Status: DC
  Filled 2021-08-31: qty 90, 90d supply, fill #0
  Filled 2022-04-10: qty 90, 90d supply, fill #1

## 2021-08-31 NOTE — Telephone Encounter (Signed)
Rx request sent to pharmacy.  

## 2021-08-31 NOTE — Telephone Encounter (Signed)
Error

## 2021-09-01 ENCOUNTER — Other Ambulatory Visit (HOSPITAL_COMMUNITY): Payer: Self-pay

## 2021-10-03 ENCOUNTER — Other Ambulatory Visit (HOSPITAL_COMMUNITY): Payer: Self-pay

## 2021-10-21 ENCOUNTER — Other Ambulatory Visit (HOSPITAL_COMMUNITY): Payer: Self-pay

## 2021-11-07 ENCOUNTER — Other Ambulatory Visit: Payer: Self-pay | Admitting: Nurse Practitioner

## 2021-11-07 ENCOUNTER — Ambulatory Visit (INDEPENDENT_AMBULATORY_CARE_PROVIDER_SITE_OTHER): Payer: 59

## 2021-11-07 VITALS — BP 160/100 | HR 76 | Temp 97.7°F | Ht 70.6 in | Wt 180.0 lb

## 2021-11-07 DIAGNOSIS — Z23 Encounter for immunization: Secondary | ICD-10-CM

## 2021-11-07 NOTE — Progress Notes (Signed)
Patient presents today for a flu shot.  

## 2021-11-14 ENCOUNTER — Other Ambulatory Visit (HOSPITAL_COMMUNITY): Payer: Self-pay

## 2021-11-14 ENCOUNTER — Encounter (HOSPITAL_BASED_OUTPATIENT_CLINIC_OR_DEPARTMENT_OTHER): Payer: Self-pay | Admitting: Family

## 2021-11-14 ENCOUNTER — Ambulatory Visit (HOSPITAL_BASED_OUTPATIENT_CLINIC_OR_DEPARTMENT_OTHER): Payer: 59 | Admitting: Family

## 2021-11-14 VITALS — BP 160/101 | HR 54 | Ht 70.6 in | Wt 187.0 lb

## 2021-11-14 DIAGNOSIS — E785 Hyperlipidemia, unspecified: Secondary | ICD-10-CM

## 2021-11-14 DIAGNOSIS — I25118 Atherosclerotic heart disease of native coronary artery with other forms of angina pectoris: Secondary | ICD-10-CM

## 2021-11-14 DIAGNOSIS — I10 Essential (primary) hypertension: Secondary | ICD-10-CM | POA: Diagnosis not present

## 2021-11-14 DIAGNOSIS — Z720 Tobacco use: Secondary | ICD-10-CM | POA: Diagnosis not present

## 2021-11-14 MED ORDER — LOSARTAN POTASSIUM 50 MG PO TABS
50.0000 mg | ORAL_TABLET | Freq: Every day | ORAL | 3 refills | Status: DC
  Filled 2021-11-14: qty 90, 90d supply, fill #0

## 2021-11-14 MED ORDER — METOPROLOL SUCCINATE ER 25 MG PO TB24
12.5000 mg | ORAL_TABLET | Freq: Every day | ORAL | 3 refills | Status: DC
  Filled 2021-11-14: qty 45, 90d supply, fill #0
  Filled 2022-02-03: qty 45, 90d supply, fill #1
  Filled 2022-05-09: qty 45, 90d supply, fill #2
  Filled 2022-08-15: qty 45, 90d supply, fill #3

## 2021-11-14 NOTE — Patient Instructions (Addendum)
Medication Instructions:  Your physician has recommended you make the following change in your medication:   START Losartan one '50mg'$  tablet daily  STOP Metoprolol Tartrate  START Metoprolol Succinate half tablet (12.'5mg'$ ) daily   *If you need a refill on your cardiac medications before your next appointment, please call your pharmacy*   Lab Work: Your physician recommends that you return for lab work today: thyroid panel, BMP, magnesium, CBC  If you have labs (blood work) drawn today and your tests are completely normal, you will receive your results only by: [place] (if you have MyChart) OR A paper copy in the mail If you have any lab test that is abnormal or we need to change your treatment, we will call you to review the results.   Testing/Procedures: Your Physician has requested you have a Myocardial Perfusion Imaging Study.    Please arrive 15 minutes prior to your appointment time for registration and insurance purposes.   The test will take approximately 3 to 4 hours to complete; you may bring reading material. If someone comes with you to your appointment, they will need to remain in the main lobby due to limited testing space in the testing area. **If you are pregnant or breastfeeding, please notify the nuclear lab prior to your appointment**   How to prepare for your test:  - Do not eat or drink 3 hours prior to your test, except you may have water  - Do not consume products containing caffeine ( regular or decaf) 12 hours prior to your test ( coffee, chocolate, sodas or teas)  - Do bring a list of your current medications with you. If not listed below, you may take your medications as normal (Hold beta blocker- 24 hour prior for exercise myoview)   - Do wear comfortable clothes (no dresses or overalls) and walking shoes ( tennis shoes preferred), no heel or open toe shoes are allowed - Do not wear cologne, perfume, aftershave, or lotions ( deodorant is allowed)  -  If these instructions are not followed, your test will be rescheduled   If you cannot keep your appointment, please provide 24 hours notice to the Nuclear Lab, to avoid a possible $50 charge to your account!     Follow-Up: At [facility], you and your health needs are our priority.  As part of our continuing mission to provide you with exceptional heart care, we have created designated Provider Care Teams.  These Care Teams include your primary Cardiologist (physician) and Advanced Practice Providers (APPs -  Physician Assistants and Nurse Practitioners) who all work together to provide you with the care you need, when you need it.  We recommend signing up for the patient portal called "MyChart".  Sign up information is provided on this After Visit Summary.  MyChart is used to connect with patients for Virtual Visits (Telemedicine).  Patients are able to view lab/test results, encounter notes, upcoming appointments, etc.  Non-urgent messages can be sent to your provider as well.   To learn more about what you can do with MyChart, go to [email or web address].    Your next appointment:   4-6 week(s)  The format for your next appointment:   In Person  Provider:   Skeet Latch, MD or Laurann Montana, NP    Other Instructions  For coronary artery disease often called "heart disease" we aim for optimal guideline directed medical therapy. We use the "A, B, C"s to help keep [place] on track!  A =  Aspirin '81mg'$  daily B = Beta blocker which helps to relax the heart. This is your Metoprolol. C = Cholesterol control. You take Atorvastatin to help control your cholesterol.  D = Don't forget nitroglycerin! This is an emergency tablet to be used if you have chest pain.   Tips to Measure your Blood Pressure Correctly  Here's what you can do to ensure a correct reading:  Don't drink a caffeinated beverage or smoke during the 30 minutes before the test.  Sit quietly for five minutes before  the test begins.  During the measurement, sit in a chair with your feet on the floor and your arm supported so your elbow is at about heart level.  The inflatable part of the cuff should completely cover at least 80% of your upper arm, and the cuff should be placed on bare skin, not over a shirt.  Don't talk during the measurement.  Blood pressure categories  Blood pressure category SYSTOLIC (upper number)  DIASTOLIC (lower number)  Normal Less than 120 mm Hg and Less than 80 mm Hg  Elevated 120-129 mm Hg and Less than 80 mm Hg  High blood pressure: Stage 1 hypertension 130-139 mm Hg or 80-89 mm Hg  High blood pressure: Stage 2 hypertension 140 mm Hg or higher or 90 mm Hg or higher  Hypertensive crisis (consult your doctor immediately) Higher than 180 mm Hg and/or Higher than 120 mm Hg  Source: American Heart Association and American Stroke Association. For more on getting your blood pressure under control, buy Controlling Your Blood Pressure, a Special Health Report from [facility].   Blood Pressure Log   Date   Time  Blood Pressure  Example: Nov 1 9 AM 124/78

## 2021-11-14 NOTE — Progress Notes (Signed)
Office Visit    Patient Name: Peter Sims Date of Encounter: 11/14/2021  PCP:  [place] Chard, [facility]  Cardiologist:  Skeet Latch, MD  Advanced Practice Provider:  No care team member to display Electrophysiologist:  None      Chief Complaint    Peter Sims is a 71 y.o. 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   Cocaine abuse ([place])    Colitis    with bleeding   COPD (chronic obstructive pulmonary disease) ([place])    Coronary artery disease    Dyspnea    GERD (gastroesophageal reflux disease)    Headache    sinusitis   Hypertension    LBP (low back pain)    now chronic   Lumbago    Lung mass    Right Upper Lobe pleural based mass, negative  on PET Scan   Neck pain    nerve pain    Pneumonia    hx   S/P angioplasty with stent 01/13/20 emergent DES to mLAD  01/15/2020   Shortness of breath 03/03/2020   Spine pain    nerve   Therapeutic drug monitoring    Tobacco abuse    Past Surgical History:  Procedure Laterality Date   CARDIAC CATHETERIZATION     COLONOSCOPY     CORONARY/GRAFT ACUTE MI REVASCULARIZATION N/A 01/14/2020   Procedure: Coronary/Graft Acute MI Revascularization;  Surgeon: Sherren Mocha, MD;  Location: [facility];  Service: Cardiovascular;  Laterality: N/A;   LEFT HEART CATH AND CORONARY ANGIOGRAPHY N/A 01/14/2020   Procedure: LEFT HEART CATH AND CORONARY ANGIOGRAPHY;  Surgeon: Sherren Mocha, MD;  Location: [facility];  Service: Cardiovascular;  Laterality: N/A;   LEFT HEART CATH AND CORONARY ANGIOGRAPHY N/A 12/13/2020   Procedure: LEFT HEART CATH AND CORONARY ANGIOGRAPHY;  Surgeon: Sherren Mocha, MD;  Location: [facility];  Service: Cardiovascular;  Laterality: N/A;   LUMBAR LAMINECTOMY/DECOMPRESSION MICRODISCECTOMY Right 05/03/2016    Procedure: MICRODISCECTOMY LUMBAR FIVE- SACRAL ONE RIGHT;  Surgeon: Ashok Pall, MD;  Location: [facility];  Service: Neurosurgery;  Laterality: Right;   NO PAST SURGERIES      Allergies  Allergies  Allergen Reactions   Penicillins Anaphylaxis and Hives    Has patient had a PCN reaction causing immediate rash, facial/tongue/throat swelling, SOB or lightheadedness with hypotension: No Has patient had a PCN reaction causing severe rash involving mucus membranes or skin necrosis: No Has patient had a PCN reaction that required hospitalization No Has patient had a PCN reaction occurring within the last 10 years: No If all of the above answers are "NO", then may proceed with Cephalosporin use.      History of Present Illness    Peter Sims is a 71 y.o. male with a hx of CAD s/p anterior STEMI, hypertension, hyperlipidemia, tobacco abuse, pulm emphysema, COPD, GERD last seen 02/09/2021 by Dr. Oval Linsey  ED visit 11/2019 precordial chest pain. Established with cardiology 12/2019 and recommended for cardiac CTA. Prior to completing, re-presented to ED wih anterior STEMI with PCI of mid LAD with otherwise mild to moderate lesion managed medically. Discharged on Metoprolol, Aspirin, Ticagrelor. Repeat ETT 04/2020 for chest pain unremarkable. Repeat LHC 11/2020 with patent LAD stent and otherwise nonobstructive disease unchanged from prior.   Last seen 11/2020  noting fewer episodes of chest pain. Had quit smoking and congratulated. Plavix stopped as one year from PCI.   Seen at PCP 11/07/21 for immunization with elevated BP 160/100.   He presents today for follow up with his wife. Blood pressure at home over the last 3 weeks with elevated readings as high as 160/101. Notes less activity, 3 cups caffeinated coffee daily, increased sodium in diet.   Tells me over the last couple weeks has been experiencing a flutter in his left chest. Most notably with position changes such as bending over.    Notes occasional sharp pain in his back that feels similar to previous anginal equivalent. Was worried it was muscular but took nitroglycerin and pain did go away. Happens most often at rest such as riding in the car. Notes he gets tired more quickly than he used to. Will feel fatigue with activities like mowing the yard. This has been going on over the last 3 months. Usually feels back to himself after about 30 minutes of rest.     EKGs/Labs/Other Studies Reviewed:   The following studies were reviewed today: LHC and Coronary Angiogram 12/13/20   Prox LAD lesion is 40% stenosed.   Prox Cx lesion is 40% stenosed.   Ost RCA to Prox RCA lesion is 30% stenosed.   2nd Mrg lesion is 50% stenosed.   Mid RCA lesion is 30% stenosed.   Non-stenotic Mid LAD lesion was previously treated. Mild non-obstructive CAD with continued patency of the LAD stent Normal LVEDP Suspect non-cardiac chest pain   Exercise Tolerance Test 05/04/20 Blood pressure demonstrated a normal response to exercise. There was no ST segment deviation noted during stress. ETT with mildly impaired exercise tolerance (4:37); patient described a mild "chest ache" in recovery; no diagnostic ST changes; normal blood pressure response; negative adequate exercise tolerance test.   LHC 01/14/20: 1.  Severe mid LAD stenosis (culprit lesion) treated successfully with PCI using a 3.5 x 16 mm Synergy DES 2.  Mild nonobstructive proximal LAD, mid circumflex, OM 2, and proximal RCA stenoses 3.  Normal LV systolic function with normal LVEDP Recommendations: Aggressive medical therapy for secondary risk reduction.  Tobacco cessation.  The patient is loaded with ticagrelor 180 mg in the Cath Lab.  He is started on a high intensity statin drug and a beta-blocker.  Discussed cardiac catheterization findings with patient and his wife.  If no complications arise, should be eligible for hospital discharge within 24 hours   CTA Chest 12/18/19 1. No  thoracic aortic aneurysm or dissection. Mild aortic atherosclerosis as well as foci of coronary artery calcification noted. 2. No pulmonary embolus evident. 3. Underlying emphysematous change. 4. Stable lentiform shaped opacity abutting the pleura in the periphery of the right upper lobe. Stability over time indicative of benign etiology. 5. Small hiatal hernia. 6. No evident thoracic adenopathy. PELVIS: 1. No aneurysm or dissection involving the aorta, major mesenteric, and major pelvic arterial vessels. Mild atherosclerotic plaque in the distal aorta. No hemodynamically obstructing lesions evident involving the arterial vessels in the abdomen and pelvis. No fibromuscular dysplasia. 2. Scattered colonic diverticula without diverticulitis. No bowel obstruction. No abscess in the abdomen or pelvis. Appendix appears normal. 3. Urinary bladder wall thickening consistent with a degree of cystitis. 4.  No renal or ureteral calculi.  No hydronephrosis  EKG:  EKG ordered today demonstrates SB 51 bpm with occasional PAC and no acute ST/T wave changes.   Recent Labs: 12/13/2020: Magnesium 2.0 03/17/2021: Hemoglobin 16.6; Platelets  220 06/28/2021: ALT 30; BUN 18; Creatinine, Ser 1.11; Potassium 4.7; Sodium 143  Recent Lipid Panel    Component Value Date/Time   CHOL 119 02/09/2021 0823   TRIG 94 02/09/2021 0823   HDL 51 02/09/2021 0823   CHOLHDL 2.3 02/09/2021 0823   CHOLHDL 2.7 01/14/2020 0128   VLDL 12 01/14/2020 0128   LDLCALC 50 02/09/2021 0823   Home Medications   Current Meds  Medication Sig   acetaminophen (TYLENOL) 325 MG tablet Take 2 tablets (650 mg total) by mouth every 4 (four) hours as needed for headache or mild pain.   aspirin 81 MG chewable tablet Chew 1 tablet (81 mg total) by mouth daily.   atorvastatin (LIPITOR) 80 MG tablet Take 1 tablet (80 mg total) by mouth daily.   Budeson-Glycopyrrol-Formoterol (BREZTRI AEROSPHERE) 160-9-4.8 MCG/ACT AERO Inhale 2 puffs into  the lungs 2 (two) times daily.   buPROPion (WELLBUTRIN XL) 150 MG 24 hr tablet Take 1 tablet (150 mg total) by mouth daily.   Carboxymethylcellul-Glycerin (CLEAR EYES FOR DRY EYES OP) Place 1 drop into both eyes daily as needed (for dry eyes).   diclofenac Sodium (VOLTAREN) 1 % GEL Apply 2 g topically 2 (two) times daily as needed (joint pain).   famotidine (PEPCID) 40 MG tablet Take 1 tablet (40 mg total) by mouth 2 (two) times daily.   ipratropium-albuterol (DUONEB) 0.5-2.5 (3) MG/3ML SOLN Inhale 1 vial via nebulizer 4 times a day (Patient taking differently: Take 3 mLs by nebulization as needed.)   losartan (COZAAR) 50 MG tablet Take 1 tablet (50 mg total) by mouth daily.   metoprolol succinate (TOPROL XL) 25 MG 24 hr tablet Take 0.5 tablets (12.5 mg total) by mouth daily.   montelukast (SINGULAIR) 10 MG tablet Take 1 tablet (10 mg total) by mouth every evening.   nicotine (NICODERM CQ - DOSED IN MG/24 HOURS) 14 mg/24hr patch Place 14 mg onto the skin daily.   pantoprazole (PROTONIX) 40 MG tablet TAKE 1 TABLET (40 MG TOTAL) BY MOUTH DAILY. STOP OMEPRAZOLE   pregabalin (LYRICA) 50 MG capsule Take 1 capsule (50 mg total) by mouth 2 (two) times daily.   Respiratory Therapy Supplies (FLUTTER) DEVI Use as directed   tamsulosin (FLOMAX) 0.4 MG CAPS capsule Take 2 capsules (0.8 mg total) by mouth daily.   [DISCONTINUED] metoprolol tartrate (LOPRESSOR) 25 MG tablet Take 0.5 tablets (12.5 mg total) by mouth 2 (two) times daily.   [DISCONTINUED] tamsulosin (FLOMAX) 0.4 MG CAPS capsule Take 1 capsule (0.4 mg total) by mouth daily.     Review of Systems    All other systems reviewed and are otherwise negative except as noted above.  Physical Exam    VS:  BP (!) 160/101 Comment: home BP  Pulse (!) 54   Ht 5' 10.6" (1.793 m)   Wt 187 lb (84.8 kg)   BMI 26.38 kg/m  , BMI Body mass index is 26.38 kg/m.  Wt Readings from Last 3 Encounters:  11/14/21 187 lb (84.8 kg)  11/07/21 180 lb (81.6 kg)   06/28/21 180 lb (81.6 kg)    GEN: Well nourished, well developed, in no acute distress. HEENT: normal. Neck: Supple, no JVD, carotid bruits, or masses. Cardiac: RRR, no murmurs, rubs, or gallops. No clubbing, cyanosis, edema.  Radials/PT 2+ and equal bilaterally.  Respiratory:  Respirations regular and unlabored, clear to auscultation bilaterally. GI: Soft, nontender, nondistended. MS: No deformity or atrophy. Skin: Warm and dry, no rash. Neuro:  Strength and sensation are  intact. Psych: Normal affect.  Assessment & Plan    CAD- EKG no acute changes. 3 month history of exercise intolerance. Notes occasional episodes of chest pain similar to anginal equivalent. Discussed PET vs myoview. Plan to proceed with exercise myoview due to faster scheduling per his preference.   Shared Decision Making/Informed Consent The risks [chest pain, shortness of breath, cardiac arrhythmias, dizziness, blood pressure fluctuations, myocardial infarction, stroke/transient ischemic attack, nausea, vomiting, allergic reaction, radiation exposure, metallic taste sensation and life-threatening complications (estimated to be 1 in 10,000)], benefits (risk stratification, diagnosing coronary artery disease, treatment guidance) and alternatives of a nuclear stress test were discussed in detail with Mr. Lindner and he agrees to proceed.   Hyperlipidemia - Continue atorvastatin '80mg'$  QD. No myalgias.   Tobacco use- Continued cessation encouraged.  Palpitations / bradycardia - PAC noted by EKG. Reports occasional palpitations. No symptomatic bradycardia. For easier medication management, transition from Metoprolol tartrate to Metoprolol Succinate today. Update BMP, CBC, TSH.  Medication management - wife requests review of medications. Brief overview of cardiac medications provided. Plan for further evaluation at follow up.   Hypertension -  BP not at goal <130/80. Start Losartan '50mg'$  QD.   HYPERTENSION  CONTROL Vitals:   11/14/21 0840 11/14/21 2100  BP: (!) 156/82 (!) 160/101    The patient's blood pressure is elevated above target today.  In order to address the patient's elevated BP: A current anti-hypertensive medication was adjusted today.         Disposition: Follow up  4-6 weeks  with Skeet Latch, MD or APP.  Signed, Loel Dubonnet, NP 11/14/2021, 9:00 PM [place]

## 2021-11-15 LAB — THYROID PANEL WITH TSH
Free Thyroxine Index: 1.6 (ref 1.2–4.9)
T3 Uptake Ratio: 23 % — ABNORMAL LOW (ref 24–39)
T4, Total: 7.1 ug/dL (ref 4.5–12.0)
TSH: 2.12 u[IU]/mL (ref 0.450–4.500)

## 2021-11-15 LAB — MAGNESIUM: Magnesium: 2.1 mg/dL (ref 1.6–2.3)

## 2021-11-15 LAB — BASIC METABOLIC PANEL
BUN/Creatinine Ratio: 11 (ref 10–24)
BUN: 12 mg/dL (ref 8–27)
CO2: 25 mmol/L (ref 20–29)
Calcium: 9.8 mg/dL (ref 8.6–10.2)
Chloride: 104 mmol/L (ref 96–106)
Creatinine, Ser: 1.07 mg/dL (ref 0.76–1.27)
Glucose: 102 mg/dL — ABNORMAL HIGH (ref 70–99)
Potassium: 4.7 mmol/L (ref 3.5–5.2)
Sodium: 142 mmol/L (ref 134–144)
eGFR: 75 mL/min/{1.73_m2} (ref >59)

## 2021-11-15 LAB — CBC
Hematocrit: 49.5 % (ref 37.5–51.0)
Hemoglobin: 16.6 g/dL (ref 13.0–17.7)
MCH: 29 pg (ref 26.6–33.0)
MCHC: 33.5 g/dL (ref 31.5–35.7)
MCV: 87 fL (ref 79–97)
Platelets: 209 10*3/uL (ref 150–450)
RBC: 5.72 x10E6/uL (ref 4.14–5.80)
RDW: 13.8 % (ref 11.6–15.4)
WBC: 6.9 10*3/uL (ref 3.4–10.8)

## 2021-11-16 ENCOUNTER — Telehealth (HOSPITAL_BASED_OUTPATIENT_CLINIC_OR_DEPARTMENT_OTHER): Payer: Self-pay

## 2021-11-16 NOTE — Telephone Encounter (Addendum)
Called results to patient and left results on VM (ok per DPR), instructions left to call office back if patient has any questions!     ----- Message from Loel Dubonnet, NP sent at 11/15/2021  7:36 AM EDT ----- Normal kidney function, electrolytes, thyroid. CBC with no evidence of anemia nor infection.  Good result!

## 2021-11-17 ENCOUNTER — Telehealth (HOSPITAL_COMMUNITY): Payer: Self-pay

## 2021-11-17 NOTE — Telephone Encounter (Signed)
Attempted to contact the patient with instructions for his stress test. Asked to call back with any questions. S.Juliyah Mergen EMTP

## 2021-11-21 ENCOUNTER — Telehealth (HOSPITAL_COMMUNITY): Payer: Self-pay | Admitting: *Deleted

## 2021-11-21 NOTE — Telephone Encounter (Signed)
Left message on voicemail per DPR in reference to upcoming appointment scheduled on 11/28/2021 at 8:00 with detailed instructions given per Myocardial Perfusion Study Information Sheet for the test. LM to arrive 15 minutes early, and that it is imperative to arrive on time for appointment to keep from having the test rescheduled. If you need to cancel or reschedule your appointment, please call the office within 24 hours of your appointment. Failure to do so may result in a cancellation of your appointment, and a $50 no show fee. Phone number given for call back for any questions.

## 2021-11-22 ENCOUNTER — Encounter (HOSPITAL_COMMUNITY): Payer: 59

## 2021-11-28 ENCOUNTER — Ambulatory Visit (HOSPITAL_COMMUNITY): Payer: 59 | Attending: Family

## 2021-11-30 ENCOUNTER — Ambulatory Visit (INDEPENDENT_AMBULATORY_CARE_PROVIDER_SITE_OTHER): Payer: 59 | Admitting: Podiatry

## 2021-11-30 ENCOUNTER — Ambulatory Visit (INDEPENDENT_AMBULATORY_CARE_PROVIDER_SITE_OTHER): Payer: 59

## 2021-11-30 ENCOUNTER — Encounter: Payer: Self-pay | Admitting: Podiatry

## 2021-11-30 DIAGNOSIS — M779 Enthesopathy, unspecified: Secondary | ICD-10-CM

## 2021-11-30 DIAGNOSIS — M722 Plantar fascial fibromatosis: Secondary | ICD-10-CM

## 2021-11-30 DIAGNOSIS — M2042 Other hammer toe(s) (acquired), left foot: Secondary | ICD-10-CM | POA: Diagnosis not present

## 2021-11-30 DIAGNOSIS — L600 Ingrowing nail: Secondary | ICD-10-CM

## 2021-11-30 DIAGNOSIS — I25118 Atherosclerotic heart disease of native coronary artery with other forms of angina pectoris: Secondary | ICD-10-CM

## 2021-11-30 MED ORDER — TRIAMCINOLONE ACETONIDE 10 MG/ML IJ SUSP
10.0000 mg | Freq: Once | INTRAMUSCULAR | Status: AC
  Administered 2021-11-30: 10 mg

## 2021-11-30 NOTE — Patient Instructions (Signed)

## 2021-12-01 ENCOUNTER — Other Ambulatory Visit (HOSPITAL_COMMUNITY): Payer: Self-pay

## 2021-12-01 NOTE — Progress Notes (Signed)
Subjective:   [name], male   DOB: 71 y.o.   MRN: [id number]   HPI Patient presents with a lot of pain in the right big toenail that is been very thick and hard for him to wear shoe gear with and has pain in the right heel that is been present for a long time worsened over the last 6 months.  Patient does have flatfoot deformity and does smoke a half a pack cigarettes per day and tries to be active   Review of Systems  All other systems reviewed and are negative.       Objective:  Physical Exam Vitals and nursing note reviewed.  Constitutional:      Appearance: He is well-developed.  Pulmonary:     Effort: Pulmonary effort is normal.  Musculoskeletal:        General: Normal range of motion.  Skin:    General: Skin is warm.  Neurological:     Mental Status: He is alert.     Neurovascular status found to be intact muscle strength was found to be adequate range of motion adequate exquisite discomfort medial fascial band right with flatfoot deformity and is noted to have a thickened deformed dystrophic right big toenail that is painful when pressed and difficult to wear shoe gear well.  Good digital perfusion well-oriented x3     Assessment:  Chronic thickened right hallux toenail along with plantar fascial inflammation right      Plan:  H&P reviewed both conditions discussed structure of the foot and x-ray and today I did sterile prep injected the plantar fascia right 3 mg Kenalog 5 mg Xylocaine applied fascial brace to lift up the arch and placed it appropriately in the mid arch area and for the nail I discussed permanent procedure patient wants this done I explained surgery risk and patient wants the procedure.  At this point signed consent form I infiltrated the right hallux 60 mg Xylocaine Marcaine mixture sterile prep done using sterile instrumentation remove the hallux nail exposed matrix applied phenol 5 applications 30 seconds followed by alcohol of  sterile dressing gave instructions on soaks and to leave dressing on 24 hours take off earlier if throbbing were to occur reappoint 3 weeks call with any questions.  X-rays indicate moderate depression of the arch may require orthotics long-term and would be a good candidate

## 2021-12-05 ENCOUNTER — Telehealth: Payer: Self-pay | Admitting: Cardiovascular Disease

## 2021-12-05 NOTE — Telephone Encounter (Signed)
Returned call to patient, he states since starting Losartan and the new Metoprolol, he feels like he is itching like something is crawling all over him. He is concerned about the itching. He states that he previously took Losartan and had issues. He states that blood pressure has been down and he is feeling good other than the itching. Patient will stop Losartan and contact us at the end of the week if itching has not improved. He prefers to wait until his upcoming appointment to reschedule his testing. Med list updated.     "Seen 11/14/21. Losartan '50mg'$  initiated. Metoprolol tartrate transitioned to Metoprolol succinate.    Cancelled lexiscan 11/22/21 and no showed myoview 11/28/21. Can offer to reschedule or defer until upcoming follow up 12/13/21.   He may stop Losartan and monitor BP at home. Continue Metoprolol succinate. If itching remains same, call us Friday to let us know and we may consider additional medication changes.    If he has changed any lotions or soaps or laundry detergents recently this also could cause itching.    Loel Dubonnet, NP "

## 2021-12-05 NOTE — Addendum Note (Signed)
Addended by: Gerald Stabs on: 12/05/2021 03:47 PM   Modules accepted: Orders

## 2021-12-05 NOTE — Telephone Encounter (Signed)
Seen 11/14/21. Losartan '50mg'$  initiated. Metoprolol tartrate transitioned to Metoprolol succinate.   Cancelled lexiscan 11/22/21 and no showed myoview 11/28/21. Can offer to reschedule or defer until upcoming follow up 12/13/21.  He may stop Losartan and monitor BP at home. Continue Metoprolol succinate. If itching remains same, call us Friday to let us know and we may consider additional medication changes.   If he has changed any lotions or soaps or laundry detergents recently this also could cause itching.   Loel Dubonnet, NP

## 2021-12-05 NOTE — Telephone Encounter (Signed)
Pt is returning call. Requesting call back.  

## 2021-12-05 NOTE — Telephone Encounter (Signed)
Left message for patient to call back  

## 2021-12-05 NOTE — Telephone Encounter (Signed)
Pt c/o medication issue:  1. Name of Medication: metoprolol succinate (TOPROL XL) 25 MG 24 hr tablet losartan (COZAAR) 50 MG tablet  2. How are you currently taking this medication (dosage and times per day)?  Take 0.5 tablets (12.5 mg total) by mouth daily. - Oral - metoprolol succinate (TOPROL XL) 25 MG 24 hr tablet  Take 1 tablet (50 mg total) by mouth daily. - losartan (COZAAR) 50 MG tablet  3. Are you having a reaction (difficulty breathing--STAT)? No   4. What is your medication issue? Pt states ever since he has started on these two medication he has been itching like crazy.

## 2021-12-13 ENCOUNTER — Ambulatory Visit (HOSPITAL_BASED_OUTPATIENT_CLINIC_OR_DEPARTMENT_OTHER): Payer: 59 | Admitting: Family

## 2022-01-03 ENCOUNTER — Ambulatory Visit: Payer: 59 | Admitting: Internal Medicine

## 2022-01-03 ENCOUNTER — Encounter: Payer: Self-pay | Admitting: Internal Medicine

## 2022-01-03 ENCOUNTER — Other Ambulatory Visit (HOSPITAL_COMMUNITY): Payer: Self-pay

## 2022-01-03 VITALS — BP 136/82 | HR 92 | Temp 98.0°F | Ht 70.0 in | Wt 180.8 lb

## 2022-01-03 DIAGNOSIS — I2583 Coronary atherosclerosis due to lipid rich plaque: Secondary | ICD-10-CM

## 2022-01-03 DIAGNOSIS — I700 Atherosclerosis of aorta: Secondary | ICD-10-CM

## 2022-01-03 DIAGNOSIS — I119 Hypertensive heart disease without heart failure: Secondary | ICD-10-CM

## 2022-01-03 DIAGNOSIS — Z2821 Immunization not carried out because of patient refusal: Secondary | ICD-10-CM

## 2022-01-03 DIAGNOSIS — R7309 Other abnormal glucose: Secondary | ICD-10-CM

## 2022-01-03 DIAGNOSIS — I2510 Atherosclerotic heart disease of native coronary artery without angina pectoris: Secondary | ICD-10-CM | POA: Diagnosis not present

## 2022-01-03 DIAGNOSIS — J4489 Other specified chronic obstructive pulmonary disease: Secondary | ICD-10-CM

## 2022-01-03 DIAGNOSIS — F17210 Nicotine dependence, cigarettes, uncomplicated: Secondary | ICD-10-CM | POA: Diagnosis not present

## 2022-01-03 MED ORDER — VALSARTAN 160 MG PO TABS
160.0000 mg | ORAL_TABLET | Freq: Every day | ORAL | 11 refills | Status: DC
  Filled 2022-01-03: qty 30, 30d supply, fill #0
  Filled 2022-02-03: qty 30, 30d supply, fill #1
  Filled 2022-03-06: qty 30, 30d supply, fill #2
  Filled 2022-04-10: qty 30, 30d supply, fill #3
  Filled 2022-05-15: qty 30, 30d supply, fill #4
  Filled 2022-06-19 (×2): qty 30, 30d supply, fill #5
  Filled 2022-07-20: qty 24, 24d supply, fill #6
  Filled 2022-07-20: qty 6, 6d supply, fill #6
  Filled 2022-08-15: qty 30, 30d supply, fill #7
  Filled 2022-09-21: qty 30, 30d supply, fill #8
  Filled 2022-10-24: qty 30, 30d supply, fill #9
  Filled 2022-11-27: qty 30, 30d supply, fill #10
  Filled 2022-12-29: qty 30, 30d supply, fill #11

## 2022-01-03 MED ORDER — TRIAMCINOLONE ACETONIDE 40 MG/ML IJ SUSP
40.0000 mg | Freq: Once | INTRAMUSCULAR | Status: AC
  Administered 2022-01-03: 40 mg via INTRAMUSCULAR

## 2022-01-03 NOTE — Progress Notes (Signed)
Barnet Glasgow Martin,acting as a Education administrator for [name], MD.,have documented all relevant documentation on the behalf of [name], MD,as directed by  [name], MD while in the presence of [name], MD.    Subjective:     [name] , male    DOB: 1950-12-03 , 71 y.o.   MRN: [id number]   Chief Complaint  Patient presents with   Hypertension    HPI  Patient presents today for a bp check, patient states compliance with medications and has no other issues today.   Hypertension This is a chronic problem. The current episode started more than 1 year ago. The problem has been gradually improving since onset. The problem is controlled. Pertinent negatives include no blurred vision, chest pain, palpitations or shortness of breath. Risk factors for coronary artery disease include smoking/tobacco exposure and sedentary lifestyle. Past treatments include angiotensin blockers. The current treatment provides moderate improvement.     Past Medical History:  Diagnosis Date   Arthritis    Bullous emphysema (HCC)    Severe Bilateral   Cocaine abuse ([place])    Colitis    with bleeding   COPD (chronic obstructive pulmonary disease) (HCC)    Coronary artery disease    Dyspnea    GERD (gastroesophageal reflux disease)    Headache    sinusitis   Hypertension    LBP (low back pain)    now chronic   Lumbago    Lung mass    Right Upper Lobe pleural based mass, negative  on PET Scan   Neck pain    nerve pain    Pneumonia    hx   S/P angioplasty with stent 01/13/20 emergent DES to mLAD  01/15/2020   Shortness of breath 03/03/2020   Spine pain    nerve   Therapeutic drug monitoring    Tobacco abuse      Family History  Adopted: Yes  Problem Relation Age of Onset   Cancer Father    Lung cancer Father    Heart failure Sister    Breast cancer Sister    Emphysema Mother      Current Outpatient Medications:    acetaminophen (TYLENOL) 325 MG  tablet, Take 2 tablets (650 mg total) by mouth every 4 (four) hours as needed for headache or mild pain., Disp: , Rfl:    albuterol (VENTOLIN HFA) 108 (90 Base) MCG/ACT inhaler, INHALE 2 PUFFS INTO THE LUNGS EVERY 6 (SIX) HOURS AS NEEDED FOR WHEEZING OR SHORTNESS OF BREATH., Disp: 8.5 g, Rfl: 2   aspirin 81 MG chewable tablet, Chew 1 tablet (81 mg total) by mouth daily., Disp: , Rfl:    atorvastatin (LIPITOR) 80 MG tablet, Take 1 tablet (80 mg total) by mouth daily., Disp: 90 tablet, Rfl: 1   Budeson-Glycopyrrol-Formoterol (BREZTRI AEROSPHERE) 160-9-4.8 MCG/ACT AERO, Inhale 2 puffs into the lungs 2 (two) times daily., Disp: 10.7 g, Rfl: 2   buPROPion (WELLBUTRIN XL) 150 MG 24 hr tablet, Take 1 tablet (150 mg total) by mouth daily., Disp: 90 tablet, Rfl: 1   Carboxymethylcellul-Glycerin (CLEAR EYES FOR DRY EYES OP), Place 1 drop into both eyes daily as needed (for dry eyes)., Disp: , Rfl:    diclofenac Sodium (VOLTAREN) 1 % GEL, Apply 2 g topically 2 (two) times daily as needed (joint pain)., Disp: 100 g, Rfl: 2   famotidine (PEPCID) 40 MG tablet, Take 1 tablet (40 mg total) by mouth 2 (two) times daily.,  Disp: 60 tablet, Rfl: 12   ipratropium-albuterol (DUONEB) 0.5-2.5 (3) MG/3ML SOLN, Inhale 1 vial via nebulizer 4 times a day (Patient taking differently: Take 3 mLs by nebulization as needed.), Disp: 270 mL, Rfl: PRN   metoprolol succinate (TOPROL XL) 25 MG 24 hr tablet, Take 0.5 tablets (12.5 mg total) by mouth daily., Disp: 45 tablet, Rfl: 3   montelukast (SINGULAIR) 10 MG tablet, Take 1 tablet (10 mg total) by mouth every evening., Disp: 30 tablet, Rfl: 11   nicotine (NICODERM CQ - DOSED IN MG/24 HOURS) 14 mg/24hr patch, Place 14 mg onto the skin daily., Disp: , Rfl:    nitroGLYCERIN (NITROSTAT) 0.4 MG SL tablet, Place 1 tablet (0.4 mg total) under the tongue every 5 (five) minutes as needed for chest pain., Disp: 25 tablet, Rfl: 4   pantoprazole (PROTONIX) 40 MG tablet, TAKE 1 TABLET (40 MG TOTAL)  BY MOUTH DAILY. STOP OMEPRAZOLE, Disp: 90 tablet, Rfl: 3   pregabalin (LYRICA) 50 MG capsule, Take 1 capsule (50 mg total) by mouth 2 (two) times daily., Disp: 180 capsule, Rfl: 1   Respiratory Therapy Supplies (FLUTTER) DEVI, Use as directed, Disp: 1 each, Rfl: 0   tamsulosin (FLOMAX) 0.4 MG CAPS capsule, Take 2 capsules (0.8 mg total) by mouth daily., Disp: 60 capsule, Rfl: 11   valsartan (DIOVAN) 160 MG tablet, Take 1 tablet (160 mg total) by mouth daily., Disp: 30 tablet, Rfl: 11   Allergies  Allergen Reactions   Penicillins Anaphylaxis and Hives    Has patient had a PCN reaction causing immediate rash, facial/tongue/throat swelling, SOB or lightheadedness with hypotension: No Has patient had a PCN reaction causing severe rash involving mucus membranes or skin necrosis: No Has patient had a PCN reaction that required hospitalization No Has patient had a PCN reaction occurring within the last 10 years: No If all of the above answers are "NO", then may proceed with Cephalosporin use.       Review of Systems  Constitutional: Negative.   HENT: Negative.    Eyes: Negative.  Negative for blurred vision.  Respiratory: Negative.  Negative for shortness of breath.   Cardiovascular: Negative.  Negative for chest pain and palpitations.  Gastrointestinal: Negative.      Today's Vitals   01/03/22 0830 01/03/22 0843  BP: (!) 140/82 136/82  Pulse: 92   Temp: 98 F (36.7 C)   TempSrc: Oral   Weight: 180 lb 12.8 oz (82 kg)   Height: _0  (1.778 m)   PainSc: 0-No pain    Body mass index is 25.94 kg/m.   Objective:  Physical Exam Vitals and nursing note reviewed.  Constitutional:      Appearance: Normal appearance.  HENT:     Head: Normocephalic and atraumatic.     Nose:     Comments: Masked     Mouth/Throat:     Comments: Masked  Eyes:     Extraocular Movements: Extraocular movements intact.  Cardiovascular:     Rate and Rhythm: Normal rate and regular rhythm.     Heart  sounds: Normal heart sounds.  Pulmonary:     Effort: Pulmonary effort is normal.     Breath sounds: Wheezing and rhonchi present.     Comments: Scattered wheezing Non-labored breathing Musculoskeletal:     Cervical back: Normal range of motion.  Skin:    General: Skin is warm.  Neurological:     General: No focal deficit present.     Mental Status: He is alert.  Psychiatric:        Mood and Affect: Mood normal.       Assessment And Plan:     1. Hypertensive heart disease without heart failure Comments: Chronic, fair control. Goal BP<130/80, optimal BP<120/80. He had rxn to losartan(hives), will add valsartan 110m daily.  He agrees to rto in 2 weeks for a nurse visit. I will check kidney function today and repeat in two weeks.  - BMP8+EGFR; Future - BMP8+EGFR  2. Aortic atherosclerosis (HCC) Comments: Chronic, LDL goal <70. He will c/w ASA and statin therapy. - Lipid panel - Liver Profile  3. Coronary atherosclerosis due to lipid rich plaque Comments: Chronic, also followed by Cardiology.  He will c/w ASA, statin therapy, Bblocker therapy.  4. COPD with chronic bronchitis Comments: Chronic, he wants to see new Pulmonologist. I will place referral to Dr. AElsworth Sohoas requested. He was also given Kenalog, 442mIM x 1. - triamcinolone acetonide (KENALOG-40) injection 40 mg - Ambulatory referral to Pulmonology  5. Other abnormal glucose Comments: His a1c has been elevated in the past, I will check an a1c today. He is encouraged to decrease his intake of sugary foods and beverages.  6. Cigarette smoker - CT CHEST LUNG CA SCREEN LOW DOSE W/O CM; Future  7. Herpes zoster vaccination declined   Patient was given opportunity to ask questions. Patient verbalized understanding of the plan and was able to repeat key elements of the plan. All questions were answered to their satisfaction.   I, RoMaximino GreenlandMD, have reviewed all documentation for this visit. The documentation on  01/03/22 for the exam, diagnosis, procedures, and orders are all accurate and complete.   IF YOU HAVE BEEN REFERRED TO A SPECIALIST, IT MAY TAKE 1-2 WEEKS TO SCHEDULE/PROCESS THE REFERRAL. IF YOU HAVE NOT HEARD FROM US/SPECIALIST IN TWO WEEKS, PLEASE GIVE [place] CALL AT [phone] X [phone].   THE PATIENT IS ENCOURAGED TO PRACTICE SOCIAL DISTANCING DUE TO THE COVID-19 PANDEMIC.

## 2022-01-03 NOTE — Patient Instructions (Addendum)
M[place]lein tea - to help cough up mucus  Hypertension, Adult Hypertension is another name for high blood pressure. High blood pressure forces your heart to work harder to pump blood. This can cause problems over time. There are two numbers in a blood pressure reading. There is a top number (systolic) over a bottom number (diastolic). It is best to have a blood pressure that is below 120/80. What are the causes? The cause of this condition is not known. Some other conditions can lead to high blood pressure. What increases the risk? Some lifestyle factors can make you more likely to develop high blood pressure: Smoking. Not getting enough exercise or physical activity. Being overweight. Having too much fat, sugar, calories, or salt (sodium) in your diet. Drinking too much alcohol. Other risk factors include: Having any of these conditions: Heart disease. Diabetes. High cholesterol. Kidney disease. Obstructive sleep apnea. Having a family history of high blood pressure and high cholesterol. Age. The risk increases with age. Stress. What are the signs or symptoms? High blood pressure may not cause symptoms. Very high blood pressure (hypertensive crisis) may cause: Headache. Fast or uneven heartbeats (palpitations). Shortness of breath. Nosebleed. Vomiting or feeling like you may vomit (nauseous). Changes in how you see. Very bad chest pain. Feeling dizzy. Seizures. How is this treated? This condition is treated by making healthy lifestyle changes, such as: Eating healthy foods. Exercising more. Drinking less alcohol. Your doctor may prescribe medicine if lifestyle changes do not help enough and if: Your top number is above 130. Your bottom number is above 80. Your personal target blood pressure may vary. Follow these instructions at home: Eating and drinking  If told, follow the DASH eating plan. To follow this plan: Fill one half of your plate at each meal with fruits and  vegetables. Fill one fourth of your plate at each meal with whole grains. Whole grains include whole-wheat pasta, brown rice, and whole-grain bread. Eat or drink low-fat dairy products, such as skim milk or low-fat yogurt. Fill one fourth of your plate at each meal with low-fat (lean) proteins. Low-fat proteins include fish, chicken without skin, eggs, beans, and tofu. Avoid fatty meat, cured and processed meat, or chicken with skin. Avoid pre-made or processed food. Limit the amount of salt in your diet to less than 1,500 mg each day. Do not drink alcohol if: Your doctor tells you not to drink. You are pregnant, may be pregnant, or are planning to become pregnant. If you drink alcohol: Limit how much you have to: 0-1 drink a day for women. 0-2 drinks a day for men. Know how much alcohol is in your drink. In the [place] one drink equals one 12 oz bottle of beer (355 mL), one 5 oz glass of wine (148 mL), or one 1 oz glass of hard liquor (44 mL). Lifestyle  Work with your doctor to stay at a healthy weight or to lose weight. Ask your doctor what the best weight is for you. Get at least 30 minutes of exercise that causes your heart to beat faster (aerobic exercise) most days of the week. This may include walking, swimming, or biking. Get at least 30 minutes of exercise that strengthens your muscles (resistance exercise) at least 3 days a week. This may include lifting weights or doing Pilates. Do not smoke or use any products that contain nicotine or tobacco. If you need help quitting, ask your doctor. Check your blood pressure at home as told by your doctor. Keep  all follow-up visits. Medicines Take over-the-counter and prescription medicines only as told by your doctor. Follow directions carefully. Do not skip doses of blood pressure medicine. The medicine does not work as well if you skip doses. Skipping doses also puts you at risk for problems. Ask your doctor about side effects or  reactions to medicines that you should watch for. Contact a doctor if: You think you are having a reaction to the medicine you are taking. You have headaches that keep coming back. You feel dizzy. You have swelling in your ankles. You have trouble with your vision. Get help right away if: You get a very bad headache. You start to feel mixed up (confused). You feel weak or numb. You feel faint. You have very bad pain in your: Chest. Belly (abdomen). You vomit more than once. You have trouble breathing. These symptoms may be an emergency. Get help right away. Call 911. Do not wait to see if the symptoms will go away. Do not drive yourself to the hospital. Summary Hypertension is another name for high blood pressure. High blood pressure forces your heart to work harder to pump blood. For most people, a normal blood pressure is less than 120/80. Making healthy choices can help lower blood pressure. If your blood pressure does not get lower with healthy choices, you may need to take medicine. This information is not intended to replace advice given to you by your health care provider. Make sure you discuss any questions you have with your health care provider. Document Revised: 12/02/2020 Document Reviewed: 12/02/2020 Elsevier Patient Education  [employer].

## 2022-01-04 LAB — BMP8+EGFR
BUN/Creatinine Ratio: 16 (ref 10–24)
BUN: 16 mg/dL (ref 8–27)
CO2: 21 mmol/L (ref 20–29)
Calcium: 10 mg/dL (ref 8.6–10.2)
Chloride: 104 mmol/L (ref 96–106)
Creatinine, Ser: 1.01 mg/dL (ref 0.76–1.27)
Glucose: 110 mg/dL — ABNORMAL HIGH (ref 70–99)
Potassium: 4.4 mmol/L (ref 3.5–5.2)
Sodium: 141 mmol/L (ref 134–144)
eGFR: 81 mL/min/{1.73_m2} (ref >59)

## 2022-01-04 LAB — LIPID PANEL
Chol/HDL Ratio: 2.5 ratio (ref 0.0–5.0)
Cholesterol, Total: 138 mg/dL (ref 100–199)
HDL: 55 mg/dL (ref >39)
LDL Chol Calc (NIH): 68 mg/dL (ref 0–99)
Triglycerides: 78 mg/dL (ref 0–149)
VLDL Cholesterol Cal: 15 mg/dL (ref 5–40)

## 2022-01-04 LAB — HEPATIC FUNCTION PANEL
ALT: 24 IU/L (ref 0–44)
AST: 18 IU/L (ref 0–40)
Albumin: 4.4 g/dL (ref 3.9–4.9)
Alkaline Phosphatase: 106 IU/L (ref 44–121)
Bilirubin Total: 0.8 mg/dL (ref 0.0–1.2)
Bilirubin, Direct: 0.26 mg/dL (ref 0.00–0.40)
Total Protein: 7.2 g/dL (ref 6.0–8.5)

## 2022-01-05 ENCOUNTER — Encounter (HOSPITAL_COMMUNITY): Payer: Self-pay | Admitting: Internal Medicine

## 2022-01-05 ENCOUNTER — Other Ambulatory Visit (HOSPITAL_COMMUNITY): Payer: Self-pay | Admitting: Internal Medicine

## 2022-01-05 DIAGNOSIS — F17210 Nicotine dependence, cigarettes, uncomplicated: Secondary | ICD-10-CM

## 2022-01-10 ENCOUNTER — Ambulatory Visit (HOSPITAL_BASED_OUTPATIENT_CLINIC_OR_DEPARTMENT_OTHER): Payer: 59 | Admitting: Family

## 2022-01-12 ENCOUNTER — Other Ambulatory Visit (HOSPITAL_COMMUNITY): Payer: Self-pay

## 2022-01-17 ENCOUNTER — Ambulatory Visit: Payer: 59

## 2022-01-23 ENCOUNTER — Other Ambulatory Visit: Payer: 59

## 2022-01-23 ENCOUNTER — Other Ambulatory Visit (HOSPITAL_COMMUNITY): Payer: Self-pay

## 2022-01-23 ENCOUNTER — Ambulatory Visit (INDEPENDENT_AMBULATORY_CARE_PROVIDER_SITE_OTHER): Payer: 59 | Admitting: Nurse Practitioner

## 2022-01-23 ENCOUNTER — Other Ambulatory Visit: Payer: Self-pay

## 2022-01-23 ENCOUNTER — Encounter: Payer: Self-pay | Admitting: Nurse Practitioner

## 2022-01-23 VITALS — BP 130/90 | HR 56 | Temp 98.1°F | Ht 70.0 in | Wt 180.0 lb

## 2022-01-23 DIAGNOSIS — J069 Acute upper respiratory infection, unspecified: Secondary | ICD-10-CM

## 2022-01-23 DIAGNOSIS — F17210 Nicotine dependence, cigarettes, uncomplicated: Secondary | ICD-10-CM

## 2022-01-23 DIAGNOSIS — R051 Acute cough: Secondary | ICD-10-CM

## 2022-01-23 DIAGNOSIS — Z79899 Other long term (current) drug therapy: Secondary | ICD-10-CM | POA: Diagnosis not present

## 2022-01-23 DIAGNOSIS — R7309 Other abnormal glucose: Secondary | ICD-10-CM | POA: Diagnosis not present

## 2022-01-23 DIAGNOSIS — J4489 Other specified chronic obstructive pulmonary disease: Secondary | ICD-10-CM | POA: Diagnosis not present

## 2022-01-23 DIAGNOSIS — I119 Hypertensive heart disease without heart failure: Secondary | ICD-10-CM

## 2022-01-23 LAB — POCT INFLUENZA A/B
Influenza A, POC: NEGATIVE
Influenza B, POC: NEGATIVE

## 2022-01-23 MED ORDER — BENZONATATE 100 MG PO CAPS
100.0000 mg | ORAL_CAPSULE | Freq: Four times a day (QID) | ORAL | 1 refills | Status: DC | PRN
  Filled 2022-01-23: qty 30, 8d supply, fill #0

## 2022-01-23 MED ORDER — PREDNISONE 10 MG (21) PO TBPK
ORAL_TABLET | ORAL | 0 refills | Status: DC
  Filled 2022-01-23: qty 21, 6d supply, fill #0

## 2022-01-23 MED ORDER — AZITHROMYCIN 250 MG PO TABS
ORAL_TABLET | ORAL | 0 refills | Status: AC
  Filled 2022-01-23: qty 6, 5d supply, fill #0

## 2022-01-23 NOTE — Progress Notes (Signed)
I,Victoria T Hamilton,acting as a Education administrator for Minette Brine, FNP.,have documented all relevant documentation on the behalf of Minette Brine, FNP,as directed by  Minette Brine, FNP while in the presence of Minette Brine, [place].    Subjective:     [name] , male    DOB: [birth year] , 71 y.o.   MRN: [id number]   Chief Complaint  Patient presents with   Influenza    HPI  Patient presents today with possible flu. He did get the flu shot this year   He reports his grand baby is currently sick. His wife were  in close contact with the baby, he feels like he contracted symptoms from his wife.  He reports initially he felt symptoms yesterday cough, headache, body aches, "crazy dreams", runny nose, chills, and hallucination.  He has eaten broth this morning.   He has used nebulizer 4 times in 2 days & taking Theraflu.   His lungs he states hurt. Lower bases of his lungs when he coughs is painful.   Patient adds he did take covid test at home yesterday, negative.      Past Medical History:  Diagnosis Date   Arthritis    Bullous emphysema (HCC)    Severe Bilateral   Cocaine abuse ([place])    Colitis    with bleeding   COPD (chronic obstructive pulmonary disease) (HCC)    Coronary artery disease    Dyspnea    GERD (gastroesophageal reflux disease)    Headache    sinusitis   Hypertension    LBP (low back pain)    now chronic   Lumbago    Lung mass    Right Upper Lobe pleural based mass, negative  on PET Scan   Neck pain    nerve pain    Pneumonia    hx   S/P angioplasty with stent 01/13/20 emergent DES to mLAD  01/15/2020   Shortness of breath 03/03/2020   Spine pain    nerve   Therapeutic drug monitoring    Tobacco abuse      Family History  Adopted: Yes  Problem Relation Age of Onset   Cancer Father    Lung cancer Father    Heart failure Sister    Breast cancer Sister    Emphysema Mother      Current Outpatient Medications:    acetaminophen  (TYLENOL) 325 MG tablet, Take 2 tablets (650 mg total) by mouth every 4 (four) hours as needed for headache or mild pain., Disp: , Rfl:    aspirin 81 MG chewable tablet, Chew 1 tablet (81 mg total) by mouth daily., Disp: , Rfl:    atorvastatin (LIPITOR) 80 MG tablet, Take 1 tablet (80 mg total) by mouth daily., Disp: 90 tablet, Rfl: 1   azithromycin (ZITHROMAX) 250 MG tablet, 2 tablets (500 mg total) daily for 1 day, THEN 1 tablet (250 mg total) daily for 4 days., Disp: 6 tablet, Rfl: 0   benzonatate (TESSALON PERLES) 100 MG capsule, Take 1 capsule (100 mg total) by mouth every 6 (six) hours as needed., Disp: 30 capsule, Rfl: 1   Budeson-Glycopyrrol-Formoterol (BREZTRI AEROSPHERE) 160-9-4.8 MCG/ACT AERO, Inhale 2 puffs into the lungs 2 (two) times daily., Disp: 10.7 g, Rfl: 2   buPROPion (WELLBUTRIN XL) 150 MG 24 hr tablet, Take 1 tablet (150 mg total) by mouth daily., Disp: 90 tablet, Rfl: 1   Carboxymethylcellul-Glycerin (CLEAR EYES FOR DRY EYES OP), Place 1 drop into both eyes daily as  needed (for dry eyes)., Disp: , Rfl:    diclofenac Sodium (VOLTAREN) 1 % GEL, Apply 2 g topically 2 (two) times daily as needed (joint pain)., Disp: 100 g, Rfl: 2   famotidine (PEPCID) 40 MG tablet, Take 1 tablet (40 mg total) by mouth 2 (two) times daily., Disp: 60 tablet, Rfl: 12   ipratropium-albuterol (DUONEB) 0.5-2.5 (3) MG/3ML SOLN, Inhale 1 vial via nebulizer 4 times a day (Patient taking differently: Take 3 mLs by nebulization as needed.), Disp: 270 mL, Rfl: PRN   metoprolol succinate (TOPROL XL) 25 MG 24 hr tablet, Take 0.5 tablets (12.5 mg total) by mouth daily., Disp: 45 tablet, Rfl: 3   montelukast (SINGULAIR) 10 MG tablet, Take 1 tablet (10 mg total) by mouth every evening., Disp: 30 tablet, Rfl: 11   nicotine (NICODERM CQ - DOSED IN MG/24 HOURS) 14 mg/24hr patch, Place 14 mg onto the skin daily., Disp: , Rfl:    pantoprazole (PROTONIX) 40 MG tablet, TAKE 1 TABLET (40 MG TOTAL) BY MOUTH DAILY. STOP  OMEPRAZOLE, Disp: 90 tablet, Rfl: 3   predniSONE (STERAPRED UNI-PAK 21 TAB) 10 MG (21) TBPK tablet, Take as directed, Disp: 21 tablet, Rfl: 0   pregabalin (LYRICA) 50 MG capsule, Take 1 capsule (50 mg total) by mouth 2 (two) times daily., Disp: 180 capsule, Rfl: 1   Respiratory Therapy Supplies (FLUTTER) DEVI, Use as directed, Disp: 1 each, Rfl: 0   tamsulosin (FLOMAX) 0.4 MG CAPS capsule, Take 2 capsules (0.8 mg total) by mouth daily., Disp: 60 capsule, Rfl: 11   valsartan (DIOVAN) 160 MG tablet, Take 1 tablet (160 mg total) by mouth daily., Disp: 30 tablet, Rfl: 11   albuterol (VENTOLIN HFA) 108 (90 Base) MCG/ACT inhaler, INHALE 2 PUFFS INTO THE LUNGS EVERY 6 (SIX) HOURS AS NEEDED FOR WHEEZING OR SHORTNESS OF BREATH., Disp: 8.5 g, Rfl: 2   nitroGLYCERIN (NITROSTAT) 0.4 MG SL tablet, Place 1 tablet (0.4 mg total) under the tongue every 5 (five) minutes as needed for chest pain., Disp: 25 tablet, Rfl: 4   Allergies  Allergen Reactions   Penicillins Anaphylaxis and Hives    Has patient had a PCN reaction causing immediate rash, facial/tongue/throat swelling, SOB or lightheadedness with hypotension: No Has patient had a PCN reaction causing severe rash involving mucus membranes or skin necrosis: No Has patient had a PCN reaction that required hospitalization No Has patient had a PCN reaction occurring within the last 10 years: No If all of the above answers are "NO", then may proceed with Cephalosporin use.       Review of Systems  Constitutional:  Positive for chills and fatigue. Negative for fever.  HENT:  Positive for rhinorrhea. Negative for congestion, sinus pressure, sneezing and sore throat.   Eyes:        Eyes burning  Respiratory:  Positive for cough. Negative for shortness of breath and wheezing.   Musculoskeletal:  Positive for myalgias.  Neurological:  Positive for headaches. Negative for dizziness and light-headedness.  Psychiatric/Behavioral: Negative.       Today's Vitals    01/23/22 0951 01/23/22 1030  BP: (!) 142/100 (!) 130/90  Pulse: (!) 56   Temp: 98.1 F (36.7 C)   SpO2: 93%   Weight: 180 lb (81.6 kg)   Height: '5\' 10"'$  (1.778 m)    Body mass index is 25.83 kg/m.  Wt Readings from Last 3 Encounters:  01/23/22 180 lb (81.6 kg)  01/03/22 180 lb 12.8 oz (82 kg)  11/14/21 187 lb (  84.8 kg)    Objective:  Physical Exam Vitals reviewed.  Constitutional:      Appearance: Normal appearance. He is obese.  Cardiovascular:     Rate and Rhythm: Normal rate and regular rhythm.     Pulses: Normal pulses.     Heart sounds: Normal heart sounds. No murmur heard. Pulmonary:     Effort: No respiratory distress.     Breath sounds: Normal breath sounds.  Musculoskeletal:        General: No swelling.  Skin:    General: Skin is warm and dry.     Capillary Refill: Capillary refill takes less than 2 seconds.  Neurological:     General: No focal deficit present.     Mental Status: He is alert and oriented to person, place, and time.  Psychiatric:        Mood and Affect: Mood normal.        Behavior: Behavior normal.        Thought Content: Thought content normal.        Judgment: Judgment normal.         Assessment And Plan:     1. Upper respiratory tract infection, unspecified type Comments: advised to take HBP coricidan band medications for symptoms.  2. Acute cough Comments: Worsened chronic cough however negative for Rapid Influenza A/B. Has deep cough, will treat with antibiotics. - POCT Influenza A/B - predniSONE (STERAPRED UNI-PAK 21 TAB) 10 MG (21) TBPK tablet; Take as directed  Dispense: 21 tablet; Refill: 0 - azithromycin (ZITHROMAX) 250 MG tablet; 2 tablets (500 mg total) daily for 1 day, THEN 1 tablet (250 mg total) daily for 4 days.  Dispense: 6 tablet; Refill: 0 - benzonatate (TESSALON PERLES) 100 MG capsule; Take 1 capsule (100 mg total) by mouth every 6 (six) hours as needed.  Dispense: 30 capsule; Refill: 1  3. COPD with chronic  bronchitis Comments: Continue with your inhalers as directed  4. Cigarette smoker  5. Hypertensive heart disease without heart failure Comments: Blood pressure is elevated likely related to taking Theraflu, repeat is improved and advised to take HBP Coricidan     Patient was given opportunity to ask questions. Patient verbalized understanding of the plan and was able to repeat key elements of the plan. All questions were answered to their satisfaction.  Minette Brine, FNP   I, Minette Brine, FNP, have reviewed all documentation for this visit. The documentation on 01/23/22 for the exam, diagnosis, procedures, and orders are all accurate and complete.   IF YOU HAVE BEEN REFERRED TO A SPECIALIST, IT MAY TAKE 1-2 WEEKS TO SCHEDULE/PROCESS THE REFERRAL. IF YOU HAVE NOT HEARD FROM US/SPECIALIST IN TWO WEEKS, PLEASE GIVE [place] A CALL AT [phone].   THE PATIENT IS ENCOURAGED TO PRACTICE SOCIAL DISTANCING DUE TO THE COVID-19 PANDEMIC.

## 2022-01-23 NOTE — Patient Instructions (Addendum)

## 2022-01-24 ENCOUNTER — Other Ambulatory Visit (HOSPITAL_COMMUNITY): Payer: Self-pay

## 2022-01-24 ENCOUNTER — Other Ambulatory Visit: Payer: 59

## 2022-01-24 ENCOUNTER — Other Ambulatory Visit: Payer: Self-pay | Admitting: Nurse Practitioner

## 2022-01-24 ENCOUNTER — Encounter: Payer: Self-pay | Admitting: Nurse Practitioner

## 2022-01-24 ENCOUNTER — Ambulatory Visit: Payer: 59

## 2022-01-24 LAB — BMP8+EGFR
BUN/Creatinine Ratio: 8 — ABNORMAL LOW (ref 10–24)
BUN: 10 mg/dL (ref 8–27)
CO2: 21 mmol/L (ref 20–29)
Calcium: 9.6 mg/dL (ref 8.6–10.2)
Chloride: 100 mmol/L (ref 96–106)
Creatinine, Ser: 1.23 mg/dL (ref 0.76–1.27)
Glucose: 98 mg/dL (ref 70–99)
Potassium: 4.4 mmol/L (ref 3.5–5.2)
Sodium: 139 mmol/L (ref 134–144)
eGFR: 64 mL/min/{1.73_m2} (ref >59)

## 2022-01-24 LAB — HEMOGLOBIN A1C
Est. average glucose Bld gHb Est-mCnc: 143 mg/dL
Hgb A1c MFr Bld: 6.6 % — ABNORMAL HIGH (ref 4.8–5.6)

## 2022-01-24 MED ORDER — OSELTAMIVIR PHOSPHATE 75 MG PO CAPS
75.0000 mg | ORAL_CAPSULE | Freq: Every day | ORAL | 0 refills | Status: AC
  Filled 2022-01-24: qty 10, 10d supply, fill #0

## 2022-01-31 ENCOUNTER — Institutional Professional Consult (permissible substitution) (HOSPITAL_BASED_OUTPATIENT_CLINIC_OR_DEPARTMENT_OTHER): Payer: 59 | Admitting: Pulmonary Disease

## 2022-02-03 ENCOUNTER — Encounter (HOSPITAL_BASED_OUTPATIENT_CLINIC_OR_DEPARTMENT_OTHER): Payer: Self-pay | Admitting: Pulmonary Disease

## 2022-02-03 ENCOUNTER — Ambulatory Visit (INDEPENDENT_AMBULATORY_CARE_PROVIDER_SITE_OTHER): Payer: 59

## 2022-02-03 ENCOUNTER — Other Ambulatory Visit (HOSPITAL_COMMUNITY): Payer: Self-pay

## 2022-02-03 ENCOUNTER — Other Ambulatory Visit: Payer: Self-pay

## 2022-02-03 ENCOUNTER — Ambulatory Visit (INDEPENDENT_AMBULATORY_CARE_PROVIDER_SITE_OTHER): Payer: 59 | Admitting: Pulmonary Disease

## 2022-02-03 VITALS — BP 118/74 | HR 92 | Temp 97.6°F | Ht 71.0 in | Wt 183.8 lb

## 2022-02-03 DIAGNOSIS — J439 Emphysema, unspecified: Secondary | ICD-10-CM

## 2022-02-03 DIAGNOSIS — J441 Chronic obstructive pulmonary disease with (acute) exacerbation: Secondary | ICD-10-CM | POA: Diagnosis not present

## 2022-02-03 DIAGNOSIS — F17210 Nicotine dependence, cigarettes, uncomplicated: Secondary | ICD-10-CM

## 2022-02-03 DIAGNOSIS — J449 Chronic obstructive pulmonary disease, unspecified: Secondary | ICD-10-CM | POA: Diagnosis not present

## 2022-02-03 MED ORDER — PREDNISONE 10 MG PO TABS
ORAL_TABLET | ORAL | 0 refills | Status: AC
  Filled 2022-02-03: qty 40, 16d supply, fill #0

## 2022-02-03 MED ORDER — ALBUTEROL SULFATE (2.5 MG/3ML) 0.083% IN NEBU
2.5000 mg | INHALATION_SOLUTION | Freq: Four times a day (QID) | RESPIRATORY_TRACT | 2 refills | Status: DC | PRN
  Filled 2022-02-03: qty 75, 7d supply, fill #0
  Filled 2023-02-02: qty 75, 7d supply, fill #1

## 2022-02-03 NOTE — Assessment & Plan Note (Signed)
Continue Breztri We can contemplate lung volume reduction in the future He will need to schedule PFTs once acute symptoms are resolved

## 2022-02-03 NOTE — Patient Instructions (Signed)
You have severe emphysema/COPD. You are having a COPD exacerbation due to a viral infection.  X chest x-ray today X Prednisone 10 mg tabs Take 4 tabs  daily with food x 4 days, then 3 tabs daily x 4 days, then 2 tabs daily x 4 days, then 1 tab daily x4 days then stop. #40  Continue on Breztri twice daily, rinse mouth after use Okay to take Mucinex 600 mg twice daily for 7 days  xUse albuterol nebs up to 4 times daily every 6 hours as needed # 30 x 2 refills

## 2022-02-03 NOTE — Assessment & Plan Note (Signed)
having a COPD exacerbation due to a viral infection.  X chest x-ray today X Prednisone 10 mg tabs Take 4 tabs  daily with food x 4 days, then 3 tabs daily x 4 days, then 2 tabs daily x 4 days, then 1 tab daily x4 days then stop. #40  Continue on Breztri twice daily, rinse mouth after use Okay to take Mucinex 600 mg twice daily for 7 days  xUse albuterol nebs up to 4 times daily every 6 hours as needed # 30 x 2 refills

## 2022-02-03 NOTE — Assessment & Plan Note (Addendum)
Smoking cessation was emphasized is the most important intervention that would add years to his life We will schedule lung cancer screening CT scan in the future

## 2022-02-03 NOTE — Progress Notes (Signed)
Subjective:    [name], male    DOB: 03-Jun-1950, 71 y.o.   MRN: [id number]  HPI  71 year old smoker who presents to establish care for COPD exacerbation. He was noted to have bullous emphysema, he has seen Dr. Joya Gaskins in 2013 and Dr. Melvyn Novas last 2018. 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. He reports increased wheezing for about 1 month, he has been maintained on Breztri for 2 years, previously was on Symbicort. About 2 weeks ago, he and his wife got sick with flulike symptoms, their granddaughter and the flu.  He tested negative for flu at PCP office was given prednisone and a Z-Pak.  He now reports persistent wheezing, cough productive of minimal white sputum, increased shortness of breath and wheezing.  Albuterol nebs helps.  While he was on prednisone he felt improved. 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 A large bleb is at the left apex  Areas of chronic mild bronchiectasis and bronchial wall thickening most evident in the lower lobes  CTA chest 11/2019 prominent bullae in the apices, stable. stable lentiform shaped opacity abutting the pleura in the lateral aspect of the left upper lobe, measuring 5.5 x 1.1 cm, stable compared to the prior study from 2019 and smaller than on the 2016 study    Past Medical History:  Diagnosis Date   Arthritis    Bullous emphysema ([place])    Severe Bilateral   Cocaine abuse ([place])    Colitis    with bleeding   COPD (chronic obstructive pulmonary disease)  (HCC)    Coronary artery disease    Dyspnea    GERD (gastroesophageal reflux disease)    Headache    sinusitis   Hypertension    LBP (low back pain)    now chronic   Lumbago    Lung mass    Right Upper Lobe pleural based mass, negative  on PET Scan   Neck pain    nerve pain    Pneumonia    hx   S/P angioplasty with stent 01/13/20 emergent DES to mLAD  01/15/2020   Shortness of breath 03/03/2020   Spine pain    nerve   Therapeutic drug monitoring    Tobacco abuse    Past Surgical History:  Procedure Laterality Date   CARDIAC CATHETERIZATION     COLONOSCOPY     CORONARY/GRAFT ACUTE MI REVASCULARIZATION N/A 01/14/2020   Procedure: Coronary/Graft Acute MI Revascularization;  Surgeon: Sherren Mocha, MD;  Location: [place] [facility];  Service: Cardiovascular;  Laterality: N/A;   LEFT HEART CATH AND CORONARY ANGIOGRAPHY N/A 01/14/2020   Procedure: LEFT HEART CATH AND CORONARY ANGIOGRAPHY;  Surgeon: Sherren Mocha, MD;  Location: [facility];  Service: Cardiovascular;  Laterality: N/A;   LEFT HEART CATH AND CORONARY ANGIOGRAPHY N/A 12/13/2020   Procedure: LEFT HEART CATH AND CORONARY ANGIOGRAPHY;  Surgeon: Sherren Mocha, MD;  Location: [facility];  Service: Cardiovascular;  Laterality: N/A;   LUMBAR LAMINECTOMY/DECOMPRESSION  MICRODISCECTOMY Right 05/03/2016   Procedure: MICRODISCECTOMY LUMBAR FIVE- SACRAL ONE RIGHT;  Surgeon: Ashok Pall, MD;  Location: [facility];  Service: Neurosurgery;  Laterality: Right;   NO PAST SURGERIES      Allergies  Allergen Reactions   Penicillins Anaphylaxis and Hives    Has patient had a PCN reaction causing immediate rash, facial/tongue/throat swelling, SOB or lightheadedness with hypotension: No Has patient had a PCN reaction causing severe rash involving mucus membranes or skin necrosis: No Has patient had a PCN reaction that required hospitalization No Has patient had a PCN reaction occurring within the last 10 years: No If all of  the above answers are "NO", then may proceed with Cephalosporin use.      Social History   Socioeconomic History   Marital status: Married    Spouse name: Not on file   Number of children: 4   Years of education: Not on file   Highest education level: Some college, no degree  Occupational History   Occupation: Merchandiser, retail: UNEMPLOYED    Comment: Previous floor maintenance   Tobacco Use   Smoking status: Every Day    Packs/day: 0.50    Years: 45.00    Total pack years: 22.50    Types: Cigarettes   Smokeless tobacco: Never   Tobacco comments:    2pks x 10 years, 1pkx30, 1/2pk x 5    04/22/20 Patient given 1-800-quit-now  and the link to [facility] health virtual smoking cessation link        Pt is smoking 10 ciggs daily as of 02/03/2022 LW  Vaping Use   Vaping Use: Never used  Substance and Sexual Activity   Alcohol use: Yes    Comment: scotch once a week   Drug use: Yes    Types: Cocaine, Marijuana    Comment: crack cocaine quit 2003   Sexual activity: Not on file  Other Topics Concern   Not on file  Social History Narrative   Not on file   Social Determinants of Health   Financial Resource Strain: Not on file  Food Insecurity: Not on file  Transportation Needs: Not on file  Physical Activity: Not on file  Stress: Not on file  Social Connections: Not on file  Intimate Partner Violence: Not on file    Family History  Adopted: Yes  Problem Relation Age of Onset   Cancer Father    Lung cancer Father    Heart failure Sister    Breast cancer Sister    Emphysema Mother      Review of Systems Shortness of breath with activity Nonproductive cough Chest pain Tooth problems Nasal congestion   Constitutional: negative for anorexia, fevers and sweats  Eyes: negative for irritation, redness and visual disturbance  Ears, nose, mouth, throat, and face: negative for earaches, epistaxis, nasal congestion and sore throat  Cardiovascular: negative for  dyspnea, lower extremity edema, orthopnea, palpitations and syncope  Gastrointestinal: negative for abdominal pain, constipation, diarrhea, melena, nausea and vomiting  Genitourinary:negative for dysuria, frequency and hematuria  Hematologic/lymphatic: negative for bleeding, easy bruising and lymphadenopathy  Musculoskeletal:negative for arthralgias, muscle weakness and stiff joints  Neurological: negative for coordination problems, gait problems, headaches and weakness  Endocrine: negative for diabetic symptoms including polydipsia, polyuria and weight loss     Objective:   Physical Exam  Gen. Pleasant, well-nourished, in no distress, normal affect ENT - no pallor,icterus, no post nasal drip Neck: No JVD, no thyromegaly, no carotid bruits Lungs: no  use of accessory muscles, no dullness to percussion, bilateral diffuse rhonchi Cardiovascular: Rhythm regular, heart sounds  normal, no murmurs or gallops, no peripheral edema Abdomen: soft and non-tender, no hepatosplenomegaly, BS normal. Musculoskeletal: No deformities, no cyanosis or clubbing Neuro:  alert, non focal       Assessment & Plan:

## 2022-02-06 ENCOUNTER — Telehealth: Payer: Self-pay

## 2022-02-06 NOTE — Telephone Encounter (Signed)
-----   Message from Rigoberto Noel, MD sent at 02/06/2022  3:41 PM EST ----- Clear lungs, no evidence of pneumonia

## 2022-02-07 ENCOUNTER — Other Ambulatory Visit (HOSPITAL_COMMUNITY): Payer: Self-pay

## 2022-02-13 ENCOUNTER — Ambulatory Visit (HOSPITAL_BASED_OUTPATIENT_CLINIC_OR_DEPARTMENT_OTHER): Payer: 59 | Admitting: Family

## 2022-02-15 ENCOUNTER — Telehealth: Payer: Self-pay

## 2022-02-15 NOTE — Telephone Encounter (Signed)
Called Pt to give results of chest Xray but no answer. LMOM for Pt to call back.

## 2022-02-16 ENCOUNTER — Other Ambulatory Visit (HOSPITAL_COMMUNITY): Payer: Self-pay

## 2022-02-16 ENCOUNTER — Other Ambulatory Visit: Payer: Self-pay

## 2022-02-16 MED ORDER — PANTOPRAZOLE SODIUM 40 MG PO TBEC
40.0000 mg | DELAYED_RELEASE_TABLET | Freq: Every day | ORAL | 3 refills | Status: DC
  Filled 2022-02-16: qty 90, 90d supply, fill #0
  Filled 2022-09-07: qty 90, 90d supply, fill #1

## 2022-02-28 ENCOUNTER — Other Ambulatory Visit (HOSPITAL_COMMUNITY): Payer: Self-pay

## 2022-03-01 ENCOUNTER — Telehealth: Payer: Self-pay

## 2022-03-06 ENCOUNTER — Telehealth: Payer: Self-pay | Admitting: Cardiovascular Disease

## 2022-03-06 ENCOUNTER — Other Ambulatory Visit (HOSPITAL_COMMUNITY): Payer: Self-pay

## 2022-03-06 ENCOUNTER — Other Ambulatory Visit: Payer: Self-pay | Admitting: General Practice

## 2022-03-06 MED ORDER — NITROGLYCERIN 0.4 MG SL SUBL
0.4000 mg | SUBLINGUAL_TABLET | SUBLINGUAL | 4 refills | Status: DC | PRN
  Filled 2022-03-06: qty 25, 5d supply, fill #0

## 2022-03-06 NOTE — Telephone Encounter (Signed)
Nitroglycerine refilled as requested. Georgana Curio MHA RN CCM

## 2022-03-06 NOTE — Telephone Encounter (Signed)
*  STAT* If patient is at the pharmacy, call can be transferred to refill team.   1. Which medications need to be refilled? (please list name of each medication and dose if known)  nitroGLYCERIN (NITROSTAT) 0.4 MG SL tablet  2. Which pharmacy/location (including street and city if local pharmacy) is medication to be sent to? [place]  3. Do they need a 30 day or 90 day supply?  Standard supply, current prescription expired.

## 2022-03-07 NOTE — Telephone Encounter (Signed)
Chmg-error.  

## 2022-03-08 ENCOUNTER — Encounter (HOSPITAL_BASED_OUTPATIENT_CLINIC_OR_DEPARTMENT_OTHER): Payer: Self-pay | Admitting: Pulmonary Disease

## 2022-03-08 ENCOUNTER — Ambulatory Visit (HOSPITAL_BASED_OUTPATIENT_CLINIC_OR_DEPARTMENT_OTHER): Payer: Commercial Managed Care - PPO | Admitting: Pulmonary Disease

## 2022-03-08 VITALS — BP 128/78 | HR 86 | Temp 97.7°F | Ht 72.0 in | Wt 181.6 lb

## 2022-03-08 DIAGNOSIS — J439 Emphysema, unspecified: Secondary | ICD-10-CM

## 2022-03-08 DIAGNOSIS — Z720 Tobacco use: Secondary | ICD-10-CM

## 2022-03-08 NOTE — Patient Instructions (Signed)
X schedule PFTs.  Based on this we can consider referral to [facility] for zephyr valve treatment of emphysema  Congratulations on cutting down cigarettes. Continue with nicotine gum and if you need we can prescribe Nicotrol inhaler to help quit

## 2022-03-08 NOTE — Progress Notes (Signed)
   Subjective:    [name], male    DOB: 30-Aug-1950, 72 y.o.   MRN: [id number]  HPI  72 year old smokerfor FU of COPD exacerbation. He was noted to have bullous emphysema, he has seen Dr. Joya Gaskins in 2013 and Dr. Melvyn Novas last 2018. 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  INitial Maurice 02/03/22 for COPD exacerbation and persistent symptoms following flulike illness >> pred course   Chief Complaint  Patient presents with   Follow-up    Pt states that he is getting better but still wheezing and rattling in chest since LOV.    He continues to smoke but has decreased to 5 cigarettes/day.  Breathing is improving He is compliant with Breztri.  Sputum was initially yellow but now has cleared up.  He is chewing Nicorette gum and is having a hard time completely quitting  Significant tests/ events reviewed  PFT's 02/02/2017 FEV1 2.71 (82 % ) ratio 75 p 12 % improvement from saba p nothing prior to study with DLCO 55/56 % corrects to 81 % for alv volume      CTA chest 06/2014 centrilobular and paraseptal emphysema. A large bleb is at the left apex  Areas of chronic mild bronchiectasis and bronchial wall thickening most evident in the lower lobes   CTA chest 11/2019 prominent bullae in the apices, stable. stable lentiform shaped opacity abutting the pleura in the lateral aspect of the left upper lobe, measuring 5.5 x 1.1 cm, stable compared to the prior study from 2019 and smaller than on the 2016 study  Review of Systems neg for any significant sore throat, dysphagia, itching, sneezing, nasal congestion or excess/ purulent secretions, fever, chills, sweats, unintended wt loss, pleuritic or exertional cp, hempoptysis, orthopnea pnd or change in chronic leg swelling. Also denies presyncope, palpitations, heartburn, abdominal pain, nausea, vomiting, diarrhea or change in bowel or urinary habits, dysuria,hematuria, rash,  arthralgias, visual complaints, headache, numbness weakness or ataxia.     Objective:   Physical Exam   Gen. Pleasant, well-nourished, in no distress ENT - no thrush, no pallor/icterus,no post nasal drip Neck: No JVD, no thyromegaly, no carotid bruits Lungs: no use of accessory muscles, no dullness to percussion,decreased BL without rales or rhonchi  Cardiovascular: Rhythm regular, heart sounds  normal, no murmurs or gallops, no peripheral edema Musculoskeletal: No deformities, no cyanosis or clubbing         Assessment & Plan:

## 2022-03-09 ENCOUNTER — Other Ambulatory Visit (HOSPITAL_COMMUNITY): Payer: Self-pay

## 2022-03-09 ENCOUNTER — Encounter (HOSPITAL_BASED_OUTPATIENT_CLINIC_OR_DEPARTMENT_OTHER): Payer: Self-pay | Admitting: Cardiovascular Disease

## 2022-03-09 ENCOUNTER — Encounter (HOSPITAL_BASED_OUTPATIENT_CLINIC_OR_DEPARTMENT_OTHER): Payer: Self-pay | Admitting: Family

## 2022-03-09 ENCOUNTER — Ambulatory Visit (HOSPITAL_BASED_OUTPATIENT_CLINIC_OR_DEPARTMENT_OTHER): Payer: Commercial Managed Care - PPO | Admitting: Family

## 2022-03-09 VITALS — BP 124/80 | HR 105 | Ht 72.0 in | Wt 183.0 lb

## 2022-03-09 DIAGNOSIS — I25118 Atherosclerotic heart disease of native coronary artery with other forms of angina pectoris: Secondary | ICD-10-CM

## 2022-03-09 DIAGNOSIS — I479 Paroxysmal tachycardia, unspecified: Secondary | ICD-10-CM

## 2022-03-09 DIAGNOSIS — I10 Essential (primary) hypertension: Secondary | ICD-10-CM | POA: Diagnosis not present

## 2022-03-09 DIAGNOSIS — E785 Hyperlipidemia, unspecified: Secondary | ICD-10-CM | POA: Diagnosis not present

## 2022-03-09 NOTE — Assessment & Plan Note (Addendum)
Reassess PFTs and based on this we can consider Zephyr valve for his severe apical bullous emphysema Continue Breztri in the meantime We discussed COPD action plan and signs and symptoms for COPD exacerbation

## 2022-03-09 NOTE — Assessment & Plan Note (Signed)
Congratulations on cutting down cigarettes. Continue with nicotine gum and if you need we can prescribe Nicotrol inhaler to help quit  He will need to be reenrolled in lung cancer screening and I see that CT scan is ordered

## 2022-03-09 NOTE — Progress Notes (Signed)
Office Visit    Patient Name: MACKEY Sims Date of Encounter: 03/09/2022  PCP:  [place]ard, [facility]  Cardiologist:  Skeet Latch, MD  Advanced Practice Provider:  No care team member to display Electrophysiologist:  None      Chief Complaint    Peter Sims is a 72 y.o. male presents today for angina concerns  Past Medical History    Past Medical History:  Diagnosis Date   Arthritis    Bullous emphysema ([place])    Severe Bilateral   Cocaine abuse ([place])    Colitis    with bleeding   COPD (chronic obstructive pulmonary disease) ([place])    Coronary artery disease    Dyspnea    GERD (gastroesophageal reflux disease)    Headache    sinusitis   Hypertension    LBP (low back pain)    now chronic   Lumbago    Lung mass    Right Upper Lobe pleural based mass, negative  on PET Scan   Neck pain    nerve pain    Pneumonia    hx   S/P angioplasty with stent 01/13/20 emergent DES to mLAD  01/15/2020   Shortness of breath 03/03/2020   Spine pain    nerve   Therapeutic drug monitoring    Tobacco abuse    Past Surgical History:  Procedure Laterality Date   CARDIAC CATHETERIZATION     COLONOSCOPY     CORONARY/GRAFT ACUTE MI REVASCULARIZATION N/A 01/14/2020   Procedure: Coronary/Graft Acute MI Revascularization;  Surgeon: Sherren Mocha, MD;  Location: [facility];  Service: Cardiovascular;  Laterality: N/A;   LEFT HEART CATH AND CORONARY ANGIOGRAPHY N/A 01/14/2020   Procedure: LEFT HEART CATH AND CORONARY ANGIOGRAPHY;  Surgeon: Sherren Mocha, MD;  Location: [facility];  Service: Cardiovascular;  Laterality: N/A;   LEFT HEART CATH AND CORONARY ANGIOGRAPHY N/A 12/13/2020   Procedure: LEFT HEART CATH AND CORONARY ANGIOGRAPHY;  Surgeon: Sherren Mocha, MD;  Location: [place] [facility];  Service: Cardiovascular;  Laterality: N/A;   LUMBAR LAMINECTOMY/DECOMPRESSION MICRODISCECTOMY Right 05/03/2016   Procedure:  MICRODISCECTOMY LUMBAR FIVE- SACRAL ONE RIGHT;  Surgeon: Ashok Pall, MD;  Location: [place];  Service: Neurosurgery;  Laterality: Right;   NO PAST SURGERIES      Allergies  Allergies  Allergen Reactions   Penicillins Anaphylaxis and Hives    Has patient had a PCN reaction causing immediate rash, facial/tongue/throat swelling, SOB or lightheadedness with hypotension: No Has patient had a PCN reaction causing severe rash involving mucus membranes or skin necrosis: No Has patient had a PCN reaction that required hospitalization No Has patient had a PCN reaction occurring within the last 10 years: No If all of the above answers are "NO", then may proceed with Cephalosporin use.      History of Present Illness    Peter Sims is a 72 y.o. male with a hx of CAD s/p anterior STEMI, hypertension, hyperlipidemia, tobacco abuse, pulm emphysema, COPD, GERD last seen 02/09/2021 by Dr. Oval Linsey  ED visit 11/2019 precordial chest pain. Established with cardiology 12/2019 and recommended for cardiac CTA. Prior to completing, re-presented to ED wih anterior STEMI with PCI of mid LAD with otherwise mild to moderate lesion managed medically. Discharged on Metoprolol, Aspirin, Ticagrelor. Repeat ETT 04/2020 for chest pain unremarkable. Repeat LHC 11/2020 with patent LAD stent and otherwise nonobstructive disease unchanged from prior.   He saw 11/2020 noting  fewer episodes of chest pain. Congratulated on quitting smoking. Plavix stopped as one year from PCI.   Last seen 11/14/21 noting occasional episodes chest pain. Exercise myoview ordered but not completed. BP not at goal and Losartan '50mg'$  added. He contacted the office noting itching with something "crawling" on him. Losartan was discontinued. Metoprolol succinate was continued. Saw PCP 01/03/22 and was started on Valsartan.   02/03/22 saw Dr. Elsworth Soho for COPD exacerbation treated with prednisone.   Presents today for his wife. Blood pressure at home  120's. He thinks his heart rate was up due to rushing into the office - denies palpitations.   Notes his symptoms from his prior MI were out of his back. When he gets exited every once in awhile he would feel symptoms in his back. He is taking nitroglycerin PRN for this. Does note nitroglycerin has improved his symptoms. Requiring nitroglycerin once per month. Describes as pain in his back under shoulder blade that then resolves.   Reports "crazy side effects" with valsartan which are improved as long as he takes it by itself. - feels it drops his blood pressure - reports dizziness for a minute if he does not take his time. Likely orthostatic. Discussed moving to evening dosing. .   EKGs/Labs/Other Studies Reviewed:   The following studies were reviewed today:  LHC and Coronary Angiogram 12/13/20   Prox LAD lesion is 40% stenosed.   Prox Cx lesion is 40% stenosed.   Ost RCA to Prox RCA lesion is 30% stenosed.   2nd Mrg lesion is 50% stenosed.   Mid RCA lesion is 30% stenosed.   Non-stenotic Mid LAD lesion was previously treated. Mild non-obstructive CAD with continued patency of the LAD stent Normal LVEDP Suspect non-cardiac chest pain   Exercise Tolerance Test 05/04/20 Blood pressure demonstrated a normal response to exercise. There was no ST segment deviation noted during stress. ETT with mildly impaired exercise tolerance (4:37); patient described a mild "chest ache" in recovery; no diagnostic ST changes; normal blood pressure response; negative adequate exercise tolerance test.   LHC 01/14/20: 1.  Severe mid LAD stenosis (culprit lesion) treated successfully with PCI using a 3.5 x 16 mm Synergy DES 2.  Mild nonobstructive proximal LAD, mid circumflex, OM 2, and proximal RCA stenoses 3.  Normal LV systolic function with normal LVEDP Recommendations: Aggressive medical therapy for secondary risk reduction.  Tobacco cessation.  The patient is loaded with ticagrelor 180 mg in the Cath  Lab.  He is started on a high intensity statin drug and a beta-blocker.  Discussed cardiac catheterization findings with patient and his wife.  If no complications arise, should be eligible for hospital discharge within 24 hours   CTA Chest 12/18/19 1. No thoracic aortic aneurysm or dissection. Mild aortic atherosclerosis as well as foci of coronary artery calcification noted. 2. No pulmonary embolus evident. 3. Underlying emphysematous change. 4. Stable lentiform shaped opacity abutting the pleura in the periphery of the right upper lobe. Stability over time indicative of benign etiology. 5. Small hiatal hernia. 6. No evident thoracic adenopathy. PELVIS: 1. No aneurysm or dissection involving the aorta, major mesenteric, and major pelvic arterial vessels. Mild atherosclerotic plaque in the distal aorta. No hemodynamically obstructing lesions evident involving the arterial vessels in the abdomen and pelvis. No fibromuscular dysplasia. 2. Scattered colonic diverticula without diverticulitis. No bowel obstruction. No abscess in the abdomen or pelvis. Appendix appears normal. 3. Urinary bladder wall thickening consistent with a degree of cystitis. 4.  No renal  or ureteral calculi.  No hydronephrosis  EKG:  EKG not ordered today. He politely declines EKG today.   Recent Labs: 11/14/2021: Hemoglobin 16.6; Magnesium 2.1; Platelets 209; TSH 2.120 01/03/2022: ALT 24 01/23/2022: BUN 10; Creatinine, Ser 1.23; Potassium 4.4; Sodium 139  Recent Lipid Panel    Component Value Date/Time   CHOL 138 01/03/2022 0914   TRIG 78 01/03/2022 0914   HDL 55 01/03/2022 0914   CHOLHDL 2.5 01/03/2022 0914   CHOLHDL 2.7 01/14/2020 0128   VLDL 12 01/14/2020 0128   LDLCALC 68 01/03/2022 0914   Home Medications   Current Meds  Medication Sig   acetaminophen (TYLENOL) 325 MG tablet Take 2 tablets (650 mg total) by mouth every 4 (four) hours as needed for headache or mild pain.   albuterol (PROVENTIL)  (2.5 MG/3ML) 0.083% nebulizer solution Take 3 mLs (2.5 mg total) by nebulization every 6 (six) hours as needed for wheezing or shortness of breath.   aspirin 81 MG chewable tablet Chew 1 tablet (81 mg total) by mouth daily.   atorvastatin (LIPITOR) 80 MG tablet Take 1 tablet (80 mg total) by mouth daily.   benzonatate (TESSALON PERLES) 100 MG capsule Take 1 capsule (100 mg total) by mouth every 6 (six) hours as needed.   Budeson-Glycopyrrol-Formoterol (BREZTRI AEROSPHERE) 160-9-4.8 MCG/ACT AERO Inhale 2 puffs into the lungs 2 (two) times daily.   buPROPion (WELLBUTRIN XL) 150 MG 24 hr tablet Take 1 tablet (150 mg total) by mouth daily.   Carboxymethylcellul-Glycerin (CLEAR EYES FOR DRY EYES OP) Place 1 drop into both eyes daily as needed (for dry eyes).   diclofenac Sodium (VOLTAREN) 1 % GEL Apply 2 g topically 2 (two) times daily as needed (joint pain).   famotidine (PEPCID) 40 MG tablet Take 1 tablet (40 mg total) by mouth 2 (two) times daily.   ipratropium-albuterol (DUONEB) 0.5-2.5 (3) MG/3ML SOLN Inhale 1 vial via nebulizer 4 times a day (Patient taking differently: Take 3 mLs by nebulization as needed.)   metoprolol succinate (TOPROL XL) 25 MG 24 hr tablet Take 0.5 tablets (12.5 mg total) by mouth daily.   montelukast (SINGULAIR) 10 MG tablet Take 1 tablet (10 mg total) by mouth every evening.   nicotine (NICODERM CQ - DOSED IN MG/24 HOURS) 14 mg/24hr patch Place 14 mg onto the skin daily.   nitroGLYCERIN (NITROSTAT) 0.4 MG SL tablet Place 1 tablet (0.4 mg total) under the tongue every 5 (five) minutes as needed for chest pain.   pantoprazole (PROTONIX) 40 MG tablet Take 1 tablet (40 mg total) by mouth daily. STOP OMEPRAZOLE   pregabalin (LYRICA) 50 MG capsule Take 1 capsule (50 mg total) by mouth 2 (two) times daily.   Respiratory Therapy Supplies (FLUTTER) DEVI Use as directed   tamsulosin (FLOMAX) 0.4 MG CAPS capsule Take 2 capsules (0.8 mg total) by mouth daily.   valsartan (DIOVAN) 160  MG tablet Take 1 tablet (160 mg total) by mouth daily.     Review of Systems    All other systems reviewed and are otherwise negative except as noted above.  Physical Exam    VS:  BP 124/80   Pulse (!) 105   Ht 6' (1.829 m)   Wt 183 lb (83 kg)   BMI 24.82 kg/m  , BMI Body mass index is 24.82 kg/m.  Wt Readings from Last 3 Encounters:  03/09/22 183 lb (83 kg)  03/08/22 181 lb 9.6 oz (82.4 kg)  02/03/22 183 lb 12.8 oz (83.4 kg)  GEN: Well nourished, well developed, in no acute distress. HEENT: normal. Neck: Supple, no JVD, carotid bruits, or masses. Cardiac: RRR, no murmurs, rubs, or gallops. No clubbing, cyanosis, edema.  Radials/PT 2+ and equal bilaterally.  Respiratory:  Respirations regular and unlabored, clear to auscultation bilaterally. GI: Soft, nontender, nondistended. MS: No deformity or atrophy. Skin: Warm and dry, no rash. Neuro:  Strength and sensation are intact. Psych: Normal affect.  Assessment & Plan    CAD- Politely declines EKG today.  Notes occasional episodes of back pain similar to anginal equivalent. Discussed PET vs myoview. Plan to proceed with exercise myoview due to faster scheduling per his preference. GDMT Aspirin, Atorvastatin, Metoprolol.   Hyperlipidemia - Continue atorvastatin '80mg'$  QD. No myalgias.   Tobacco use- Continued cessation encouraged.  Palpitations / bradycardia - Tachycardic today which he attributes to rushing into office. Asymptomatic no palpitations, lightheadedness, dizziness. Continue Toprol. Regular by auscultation today.   Hypertension -  BP well controlled. Continue current antihypertensive regimen.           Disposition: Follow up  4-6 weeks  with Skeet Latch, MD or APP.  Signed, Loel Dubonnet, NP 03/09/2022, 3:19 PM [place]

## 2022-03-09 NOTE — Patient Instructions (Signed)
Medication Instructions:  Your physician has recommended you make the following change in your medication:   Change: Valsartan to the evening   *If you need a refill on your cardiac medications before your next appointment, please call your pharmacy*  Testing/Procedures: Your Physician has requested you have a Myocardial Perfusion Imaging Study.   Please arrive 15 minutes prior to your appointment time for registration and insurance purposes.   The test will take approximately 3 to 4 hours to complete; you may bring reading material. If someone comes with you to your appointment, they will need to remain in the main lobby due to limited testing space in the testing area. **If you are pregnant or breastfeeding, please notify the nuclear lab prior to your appointment**   How to prepare for your test:  - Do not eat or drink 3 hours prior to your test, except you may have water  - Do not consume products containing caffeine ( regular or decaf) 12 hours prior to your test ( coffee, chocolate, sodas or teas)  - Do bring a list of your current medications with you. If not listed below, you may take your medications as normal (Hold beta blocker- 24 hour prior for exercise myoview)   - Do wear comfortable clothes (no dresses or overalls) and walking shoes ( tennis shoes preferred), no heel or open toe shoes are allowed - Do not wear cologne, perfume, aftershave, or lotions ( deodorant is allowed)  - If these instructions are not followed, your test will be rescheduled   If you cannot keep your appointment, please provide 24 hours notice to the Nuclear Lab, to avoid a possible $50 charge to your account!    Follow-Up: At [facility], you and your health needs are our priority.  As part of our continuing mission to provide you with exceptional heart care, we have created designated Provider Care Teams.  These Care Teams include your primary Cardiologist (physician) and Advanced Practice  Providers (APPs -  Physician Assistants and Nurse Practitioners) who all work together to provide you with the care you need, when you need it.  We recommend signing up for the patient portal called "MyChart".  Sign up information is provided on this After Visit Summary.  MyChart is used to connect with patients for Virtual Visits (Telemedicine).  Patients are able to view lab/test results, encounter notes, upcoming appointments, etc.  Non-urgent messages can be sent to your provider as well.   To learn more about what you can do with MyChart, go to [email or web address].    Your next appointment:   4-6 month(s)  Provider:   Skeet Latch, MD or Laurann Montana, NP

## 2022-03-10 ENCOUNTER — Telehealth (HOSPITAL_COMMUNITY): Payer: Self-pay | Admitting: *Deleted

## 2022-03-10 NOTE — Telephone Encounter (Signed)
Left message on voicemail per DPR in reference to upcoming appointment scheduled on 03/14/2022 at 7:45 with detailed instructions given per Myocardial Perfusion Study Information Sheet for the test. LM to arrive 15 minutes early, and that it is imperative to arrive on time for appointment to keep from having the test rescheduled. If you need to cancel or reschedule your appointment, please call the office within 24 hours of your appointment. Failure to do so may result in a cancellation of your appointment, and a $50 no show fee. Phone number given for call back for any questions.

## 2022-03-14 ENCOUNTER — Ambulatory Visit (HOSPITAL_COMMUNITY): Payer: Commercial Managed Care - PPO | Attending: Cardiology

## 2022-03-14 DIAGNOSIS — I25118 Atherosclerotic heart disease of native coronary artery with other forms of angina pectoris: Secondary | ICD-10-CM | POA: Insufficient documentation

## 2022-03-14 DIAGNOSIS — E785 Hyperlipidemia, unspecified: Secondary | ICD-10-CM | POA: Insufficient documentation

## 2022-03-14 LAB — MYOCARDIAL PERFUSION IMAGING
Angina Index: 0
Duke Treadmill Score: 5
Estimated workload: 6.4
Exercise duration (min): 4 min
Exercise duration (sec): 30 s
LV dias vol: 57 mL (ref 62–150)
LV sys vol: 24 mL
MPHR: 151 {beats}/min
Nuc Stress EF: 57 %
Peak HR: 153 {beats}/min
Percent HR: 101 %
Rest HR: 100 {beats}/min
Rest Nuclear Isotope Dose: 10.4 mCi
SDS: 0
SRS: 0
SSS: 0
ST Depression (mm): 0 mm
Stress Nuclear Isotope Dose: 31.9 mCi
TID: 1

## 2022-03-14 MED ORDER — TECHNETIUM TC 99M TETROFOSMIN IV KIT
31.9000 | PACK | Freq: Once | INTRAVENOUS | Status: AC | PRN
  Administered 2022-03-14: 31.9 via INTRAVENOUS

## 2022-03-14 MED ORDER — TECHNETIUM TC 99M TETROFOSMIN IV KIT
10.4000 | PACK | Freq: Once | INTRAVENOUS | Status: AC | PRN
  Administered 2022-03-14: 10.4 via INTRAVENOUS

## 2022-03-23 ENCOUNTER — Telehealth (HOSPITAL_BASED_OUTPATIENT_CLINIC_OR_DEPARTMENT_OTHER): Payer: Self-pay

## 2022-03-23 ENCOUNTER — Other Ambulatory Visit: Payer: Self-pay | Admitting: Internal Medicine

## 2022-03-23 ENCOUNTER — Other Ambulatory Visit (HOSPITAL_COMMUNITY): Payer: Self-pay

## 2022-03-23 MED ORDER — BREZTRI AEROSPHERE 160-9-4.8 MCG/ACT IN AERO
2.0000 | INHALATION_SPRAY | Freq: Two times a day (BID) | RESPIRATORY_TRACT | 2 refills | Status: DC
  Filled 2022-03-23: qty 10.7, 30d supply, fill #0
  Filled 2022-06-09: qty 10.7, 30d supply, fill #1
  Filled 2022-07-28: qty 10.7, 30d supply, fill #2

## 2022-03-23 NOTE — Telephone Encounter (Addendum)
Attempted to contact patient, no answer, left message for patient to call back    ----- Message from Loel Dubonnet, NP sent at 03/23/2022 11:00 AM EST ----- Can we call to see if his lightheadedness has improved since moving Valsartan to evening? Thank you! ----- Message ----- From: Loel Dubonnet, NP Sent: 03/23/2022  12:00 AM EST To: Loel Dubonnet, NP  Check lightheadedness after moving valsartan to evening.

## 2022-03-24 ENCOUNTER — Other Ambulatory Visit (HOSPITAL_COMMUNITY): Payer: Self-pay

## 2022-03-24 NOTE — Telephone Encounter (Signed)
Call to patient attempted, no answer, left message for patient to call back.

## 2022-03-27 ENCOUNTER — Encounter (HOSPITAL_BASED_OUTPATIENT_CLINIC_OR_DEPARTMENT_OTHER): Payer: Self-pay

## 2022-03-27 NOTE — Telephone Encounter (Signed)
Glad symptoms improved. Continue current medications and plan of care.   Loel Dubonnet, NP

## 2022-03-27 NOTE — Telephone Encounter (Signed)
Patient returned call and states he is feeling much better since medication adjustment. Informed patient no letter being sent since I heard back from him.

## 2022-03-27 NOTE — Telephone Encounter (Signed)
3rd call attempt to patient, no answer, left message, and mailed letter to patient asking him to call the office and provide an update.

## 2022-04-11 ENCOUNTER — Other Ambulatory Visit (HOSPITAL_COMMUNITY): Payer: Self-pay

## 2022-04-27 ENCOUNTER — Other Ambulatory Visit (HOSPITAL_COMMUNITY): Payer: Self-pay

## 2022-05-01 ENCOUNTER — Other Ambulatory Visit (HOSPITAL_COMMUNITY): Payer: Self-pay

## 2022-05-01 MED ORDER — TAMSULOSIN HCL 0.4 MG PO CAPS
0.8000 mg | ORAL_CAPSULE | Freq: Every day | ORAL | 0 refills | Status: DC
  Filled 2022-05-01: qty 30, 15d supply, fill #0

## 2022-05-15 ENCOUNTER — Other Ambulatory Visit (HOSPITAL_COMMUNITY): Payer: Self-pay

## 2022-06-09 ENCOUNTER — Other Ambulatory Visit (HOSPITAL_COMMUNITY): Payer: Self-pay

## 2022-06-09 ENCOUNTER — Other Ambulatory Visit: Payer: Self-pay

## 2022-06-12 ENCOUNTER — Other Ambulatory Visit: Payer: Self-pay

## 2022-06-13 IMAGING — CT CT ANGIO CHEST-ABD-PELV FOR DISSECTION W/ AND WO/W CM
2 of 7 series · 12 of 46 positions shown, 14 images · IV contrast (OMNI)
Comparison: Chest CT February 02, 2018; chest radiograph December 18, 2019; prior chest CT July 27, 2014

CLINICAL DATA: Chest and abdominal pain

EXAM:
CT ANGIOGRAPHY CHEST, ABDOMEN AND PELVIS
TECHNIQUE: Non-contrast CT of the chest was initially obtained.

[Series 6: dissection 3.0 i30f 3 · axial · 0.82mm/px · z∈[+868,+1424]mm · 9 of 213 slices shown, 11 images]
[im 14/213  soft-tissue]
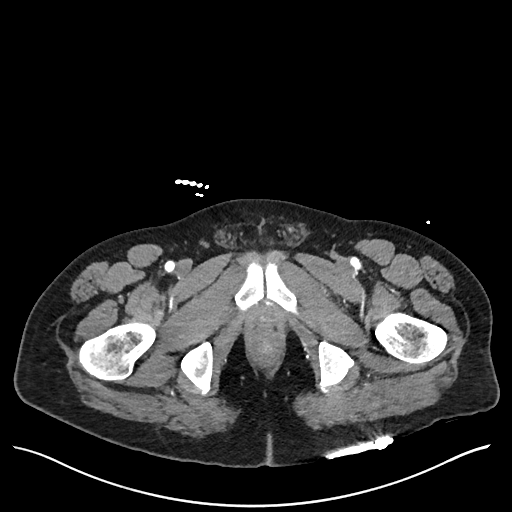
[im 14/213  bone]
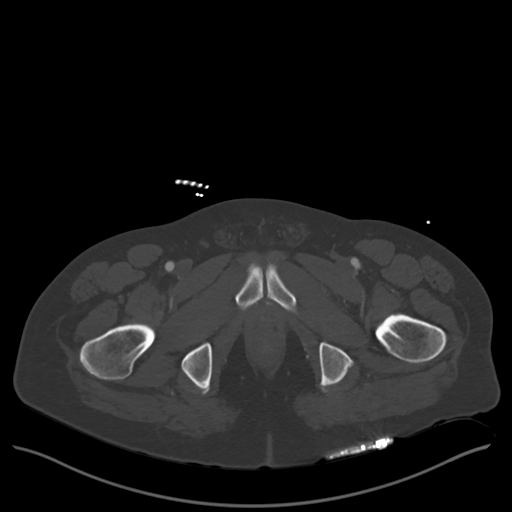
[im 40/213  soft-tissue]
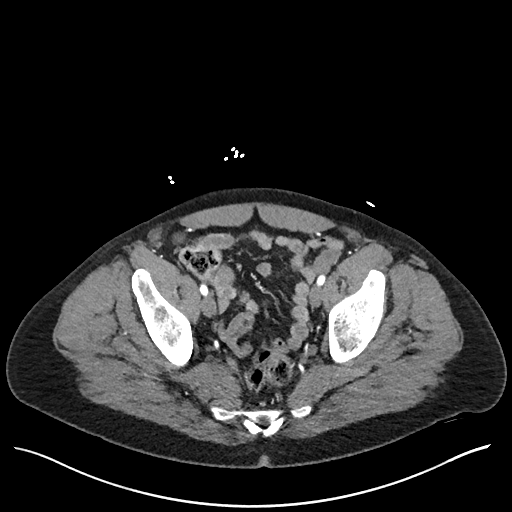
[im 67/213  soft-tissue]
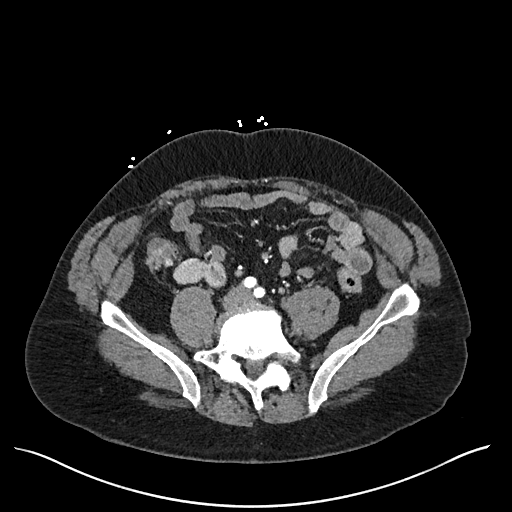
[im 80/213  soft-tissue]
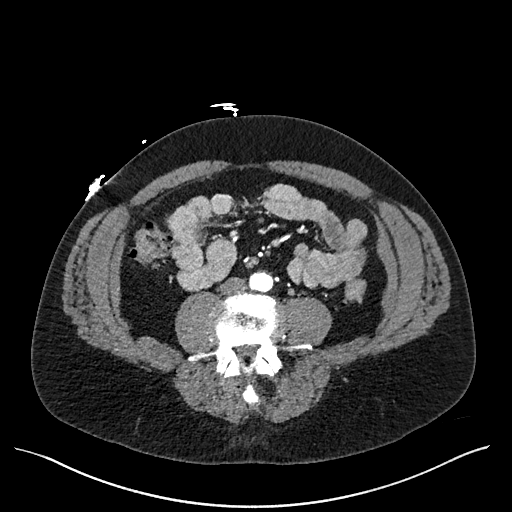
[im 107/213  soft-tissue]
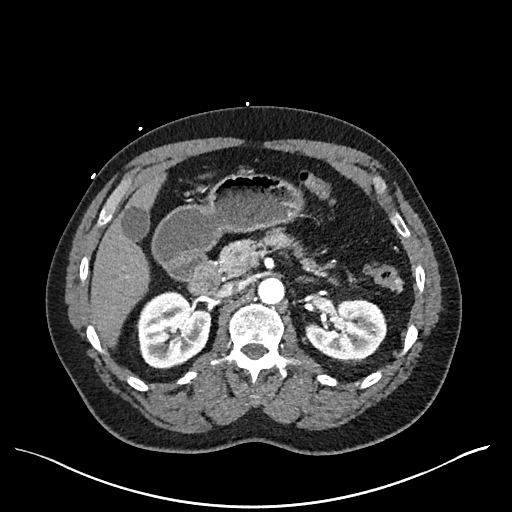
[im 133/213  soft-tissue]
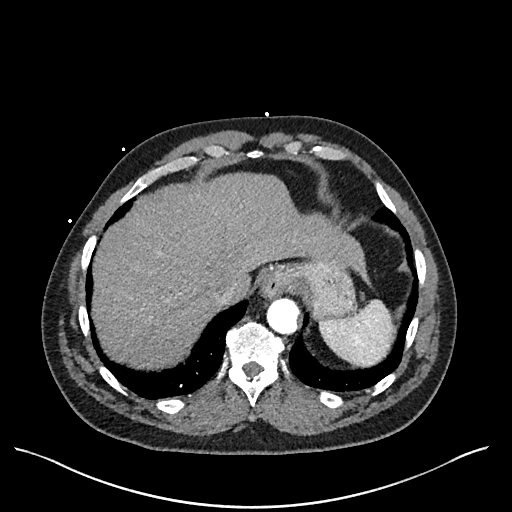
[im 146/213  soft-tissue]
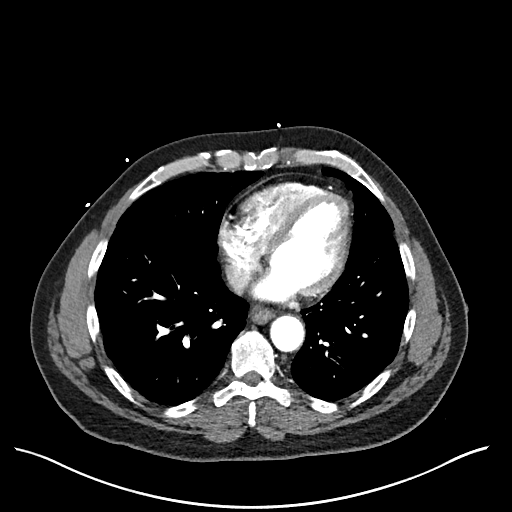
[im 173/213  soft-tissue]
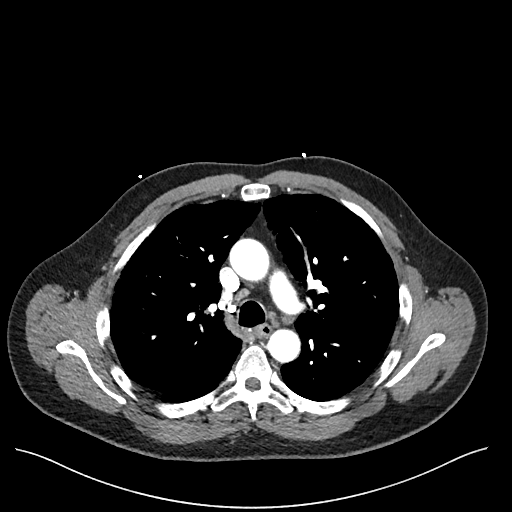
[im 199/213  soft-tissue]
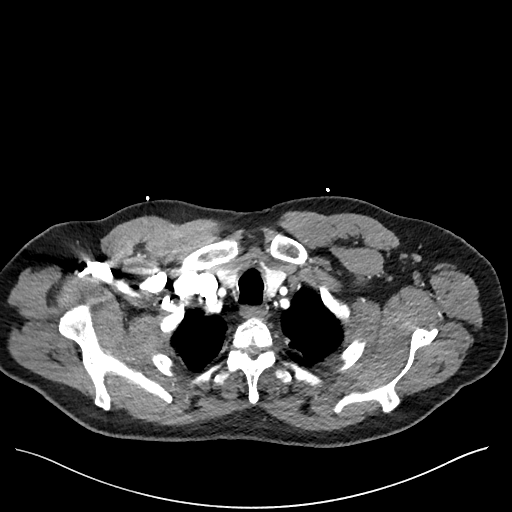
[im 199/213  bone]
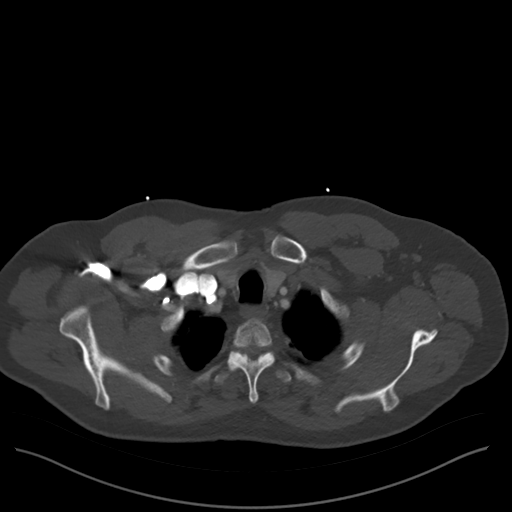

[Series 9: coronals · coronal · 0.79mm/px · 3 of 151 slices shown]
[im 38/151  soft-tissue]
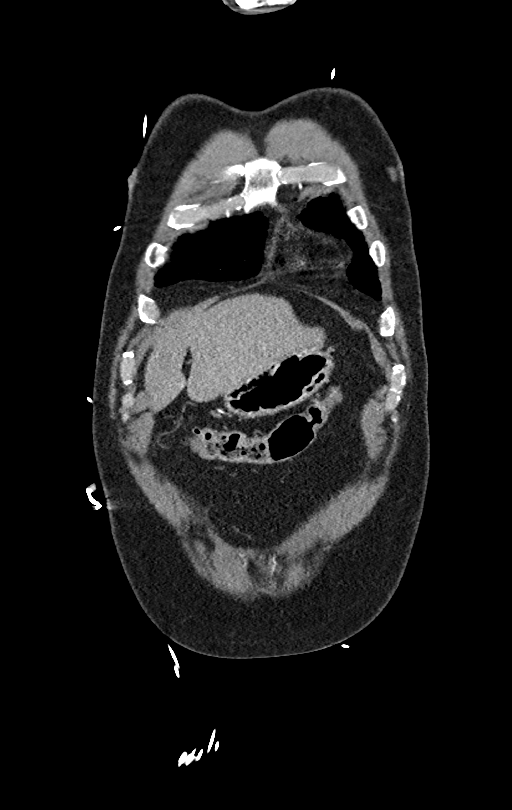
[im 76/151  soft-tissue]
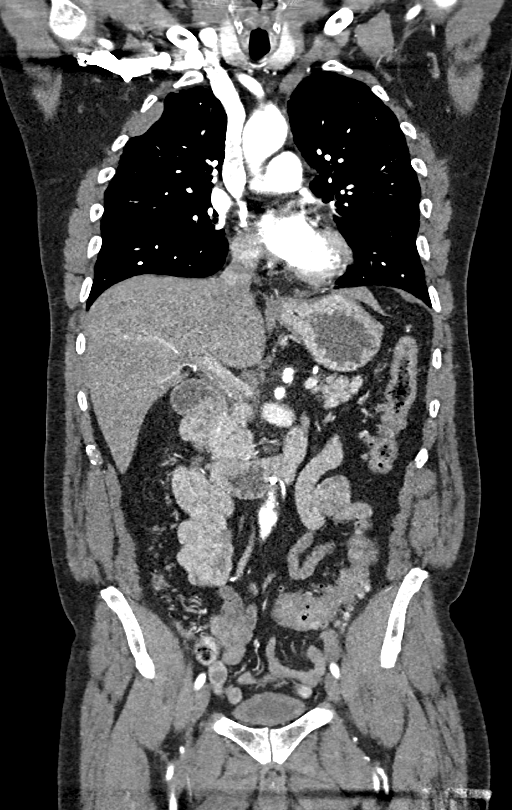
[im 113/151  soft-tissue]
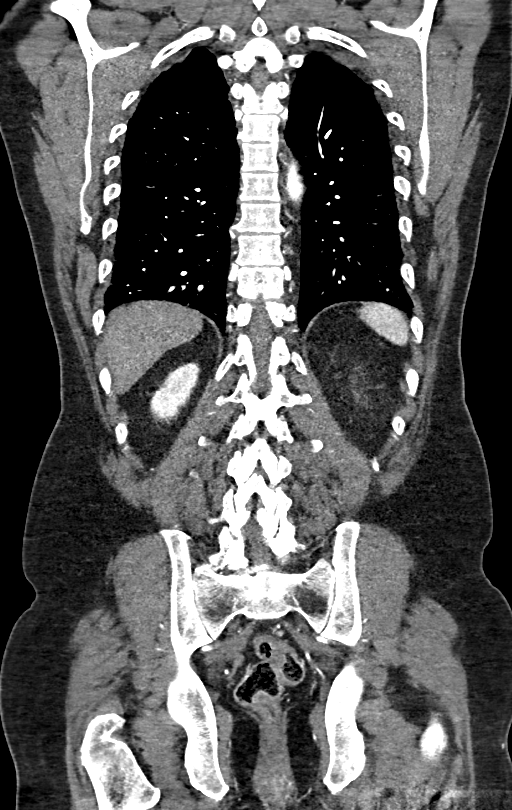

[12 of 46 positions shown; findings below may reference images not displayed]

Multidetector CT imaging through the chest, abdomen and pelvis was
performed using the standard protocol during bolus administration of
intravenous contrast. Multiplanar reconstructed images and MIPs were
obtained and reviewed to evaluate the vascular anatomy.

CONTRAST:  80mL OMNIPAQUE IOHEXOL 350 MG/ML SOLN
FINDINGS: CTA CHEST FINDINGS

Cardiovascular: There is no intramural hematoma evident on
noncontrast enhanced study. There is no evident intramural hematoma.
No thoracic aortic aneurysm or dissection. Visualized great vessels
appear normal. There is mild aortic atherosclerosis. There are
occasional foci of coronary artery calcification. There is no
pericardial effusion or pericardial thickening.

Mediastinum/Nodes: Thyroid appears normal. There is no appreciable
thoracic adenopathy. There is a small hiatal hernia.

Lungs/Pleura: There is underlying centrilobular and paraseptal
emphysematous change. There is a stable lentiform shaped opacity
abutting the pleura in the lateral aspect of the left upper lobe,
measuring 5.5 x 1.1 cm, stable compared to the prior study from 3703
and smaller than on the 4583 study. No similar areas of localized
opacity abutting the pleura. Note that there are prominent bullae in
the apices, stable. There is no edema or airspace opacity. No
pleural effusions are evident.

Musculoskeletal: No blastic or lytic bone lesions. No evident chest
wall lesions.

Review of the MIP images confirms the above findings.

CTA ABDOMEN AND PELVIS FINDINGS

VASCULAR

Aorta: There is no evident abdominal aortic aneurysm or dissection.
There is slight atherosclerotic plaque in the distal aorta without
evidence suggesting hemodynamically significant obstruction.

Celiac: Celiac artery and its branches are widely patent without
aneurysm or dissection evident.

SMA: Superior mesenteric artery and its branches are widely patent.
No evident aneurysm or dissection involving these vessels.

Renals: There is a single renal artery on each side. Renal arteries
and their respective branches are widely patent. No aneurysm or
dissection involving these vessels. No demonstrable fibromuscular
dysplasia on either side.

IMA: The inferior mesenteric artery and its branches are widely
patent without appreciable aneurysm or dissection involving these
vessels.

Inflow: No aneurysm or dissection noted involving the pelvic
arterial vessels. There is minimal calcification in the distal right
common iliac artery. No hemodynamically significant obstruction
involving pelvic arterial vessels. There is slight calcification in
the proximal superficial femoral arteries bilaterally without
appreciable hemodynamically significant obstruction. No aneurysm or
dissection proximal superficial and profunda femoral arteries.

Veins: No obvious venous abnormality within the limitations of this
arterial phase study.

Review of the MIP images confirms the above findings.

NON-VASCULAR

Hepatobiliary: There there is an apparent degree of hepatic
steatosis. No focal liver lesions are appreciable. The gallbladder
wall is not appreciably thickened. There is no biliary duct
dilatation.

Pancreas: No pancreatic mass or inflammatory focus.

Spleen: No splenic lesions are evident.

Adrenals/Urinary Tract: Adrenals bilaterally appear normal. Kidneys
bilaterally show no evident mass or hydronephrosis on either side.
There is no renal or ureteral calculus on either side. The urinary
bladder is midline. There is thickening of the wall of the urinary
bladder.

Stomach/Bowel: There are occasional sigmoid diverticula without
diverticulitis. Occasional scattered diverticula elsewhere in the
colon without diverticulitis. There is no appreciable bowel wall or
mesenteric thickening. The terminal ileum appears normal. No evident
bowel obstruction. No free air or portal venous air.

Lymphatic: No lymph node enlargement evident in the abdomen or
pelvis.

Reproductive: Occasional prostatic calculi. Prostate and seminal
vesicles appear normal in size and configuration.

Other: Appendix appears unremarkable. No evident abscess or ascites
in the abdomen or pelvis.

Musculoskeletal: There is degenerative change in the lumbar spine.
No blastic or lytic bone lesions are appreciable. No appreciable
intramuscular or abdominal wall lesions.

Review of the MIP images confirms the above findings.
IMPRESSION: CT angiogram chest:

1. No thoracic aortic aneurysm or dissection. Mild aortic
atherosclerosis as well as foci of coronary artery calcification
noted.
2. No pulmonary embolus evident.
3. Underlying emphysematous change.
4. Stable lentiform shaped opacity abutting the pleura in the
periphery of the right upper lobe. Stability over time indicative of
benign etiology.
5. Small hiatal hernia.
6. No evident thoracic adenopathy.

CT angiogram abdomen; CT angiogram pelvis:

1. No aneurysm or dissection involving the aorta, major mesenteric,
and major pelvic arterial vessels. Mild atherosclerotic plaque in
the distal aorta. No hemodynamically obstructing lesions evident
involving the arterial vessels in the abdomen and pelvis. No
fibromuscular dysplasia.

2. Scattered colonic diverticula without diverticulitis. No bowel
obstruction. No abscess in the abdomen or pelvis. Appendix appears
normal.

3. Urinary bladder wall thickening consistent with a degree of
cystitis.

4.  No renal or ureteral calculi.  No hydronephrosis.

## 2022-06-19 ENCOUNTER — Other Ambulatory Visit (HOSPITAL_COMMUNITY): Payer: Self-pay

## 2022-06-22 ENCOUNTER — Other Ambulatory Visit: Payer: Self-pay

## 2022-07-06 ENCOUNTER — Encounter: Payer: Medicare Other | Admitting: Internal Medicine

## 2022-07-06 NOTE — Progress Notes (Deleted)
Subjective:     [name] , male    DOB: 25-Aug-1950 , 72 y.o.   MRN: [id number]   No chief complaint on file.   HPI  HPI   Past Medical History:  Diagnosis Date   Arthritis    Bullous emphysema (HCC)    Severe Bilateral   Cocaine abuse (HCC)    Colitis    with bleeding   COPD (chronic obstructive pulmonary disease) (HCC)    Coronary artery disease    Dyspnea    GERD (gastroesophageal reflux disease)    Headache    sinusitis   Hypertension    LBP (low back pain)    now chronic   Lumbago    Lung mass    Right Upper Lobe pleural based mass, negative  on PET Scan   Neck pain    nerve pain    Pneumonia    hx   S/P angioplasty with stent 01/13/20 emergent DES to mLAD  01/15/2020   Shortness of breath 03/03/2020   Spine pain    nerve   Therapeutic drug monitoring    Tobacco abuse      Family History  Adopted: Yes  Problem Relation Age of Onset   Cancer Father    Lung cancer Father    Heart failure Sister    Breast cancer Sister    Emphysema Mother      Current Outpatient Medications:    acetaminophen (TYLENOL) 325 MG tablet, Take 2 tablets (650 mg total) by mouth every 4 (four) hours as needed for headache or mild pain., Disp: , Rfl:    albuterol (PROVENTIL) (2.5 MG/3ML) 0.083% nebulizer solution, Take 3 mLs (2.5 mg total) by nebulization every 6 (six) hours as needed for wheezing or shortness of breath., Disp: 75 mL, Rfl: 2   albuterol (VENTOLIN HFA) 108 (90 Base) MCG/ACT inhaler, INHALE 2 PUFFS INTO THE LUNGS EVERY 6 (SIX) HOURS AS NEEDED FOR WHEEZING OR SHORTNESS OF BREATH., Disp: 8.5 g, Rfl: 2   aspirin 81 MG chewable tablet, Chew 1 tablet (81 mg total) by mouth daily., Disp: , Rfl:    atorvastatin (LIPITOR) 80 MG tablet, Take 1 tablet (80 mg total) by mouth daily., Disp: 90 tablet, Rfl: 1   benzonatate (TESSALON PERLES) 100 MG capsule, Take 1 capsule (100 mg total) by mouth every 6 (six) hours as needed., Disp: 30 capsule, Rfl: 1    Budeson-Glycopyrrol-Formoterol (BREZTRI AEROSPHERE) 160-9-4.8 MCG/ACT AERO, Inhale 2 puffs into the lungs 2 (two) times daily., Disp: 10.7 g, Rfl: 2   buPROPion (WELLBUTRIN XL) 150 MG 24 hr tablet, Take 1 tablet (150 mg total) by mouth daily., Disp: 90 tablet, Rfl: 1   Carboxymethylcellul-Glycerin (CLEAR EYES FOR DRY EYES OP), Place 1 drop into both eyes daily as needed (for dry eyes)., Disp: , Rfl:    diclofenac Sodium (VOLTAREN) 1 % GEL, Apply 2 g topically 2 (two) times daily as needed (joint pain)., Disp: 100 g, Rfl: 2   famotidine (PEPCID) 40 MG tablet, Take 1 tablet (40 mg total) by mouth 2 (two) times daily., Disp: 60 tablet, Rfl: 12   ipratropium-albuterol (DUONEB) 0.5-2.5 (3) MG/3ML SOLN, Inhale 1 vial via nebulizer 4 times a day (Patient taking differently: Take 3 mLs by nebulization as needed.), Disp: 270 mL, Rfl: PRN   metoprolol succinate (TOPROL XL) 25 MG 24 hr tablet, Take 0.5 tablets (12.5 mg total) by mouth daily., Disp: 45 tablet, Rfl: 3   montelukast (SINGULAIR) 10 MG tablet,  Take 1 tablet (10 mg total) by mouth every evening., Disp: 30 tablet, Rfl: 11   nicotine (NICODERM CQ - DOSED IN MG/24 HOURS) 14 mg/24hr patch, Place 14 mg onto the skin daily., Disp: , Rfl:    nitroGLYCERIN (NITROSTAT) 0.4 MG SL tablet, Place 1 tablet (0.4 mg total) under the tongue every 5 (five) minutes as needed for chest pain., Disp: 25 tablet, Rfl: 4   pantoprazole (PROTONIX) 40 MG tablet, Take 1 tablet (40 mg total) by mouth daily. STOP OMEPRAZOLE, Disp: 90 tablet, Rfl: 3   pregabalin (LYRICA) 50 MG capsule, Take 1 capsule (50 mg total) by mouth 2 (two) times daily., Disp: 180 capsule, Rfl: 1   Respiratory Therapy Supplies (FLUTTER) DEVI, Use as directed, Disp: 1 each, Rfl: 0   tamsulosin (FLOMAX) 0.4 MG CAPS capsule, Take 2 capsules (0.8 mg total) by mouth at bedtime. Pt needs follow up for additional refills, Disp: 30 capsule, Rfl: 0   valsartan (DIOVAN) 160 MG tablet, Take 1 tablet (160 mg total) by  mouth daily., Disp: 30 tablet, Rfl: 11   Allergies  Allergen Reactions   Penicillins Anaphylaxis and Hives    Has patient had a PCN reaction causing immediate rash, facial/tongue/throat swelling, SOB or lightheadedness with hypotension: No Has patient had a PCN reaction causing severe rash involving mucus membranes or skin necrosis: No Has patient had a PCN reaction that required hospitalization No Has patient had a PCN reaction occurring within the last 10 years: No If all of the above answers are "NO", then may proceed with Cephalosporin use.       Men's preventive visit. Patient Health Questionnaire (PHQ-2) is  Flowsheet Row Office Visit from 01/23/2022 in [facility]  PHQ-2 Total Score 0     . Patient is on a *** diet. Marital status: Married. Relevant history for alcohol use is:  Social History   Substance and Sexual Activity  Alcohol Use Yes   Comment: scotch once a week  . Relevant history for tobacco use is:  Social History   Tobacco Use  Smoking Status Every Day   Packs/day: 0.25   Years: 45.00   Additional pack years: 0.00   Total pack years: 11.25   Types: Cigarettes  Smokeless Tobacco Never  Tobacco Comments   Pt states that he smoke 5 ciggs a day as of 03/08/2022 LW   Pt chewing nicorette gum as of  03/08/2022 LW   04/22/20 Patient given 1-800-quit-now  and the link to [facility] health virtual smoking cessation link      Pt is smoking 10 ciggs daily as of 02/03/2022 LW  .   Review of Systems   There were no vitals filed for this visit. There is no height or weight on file to calculate BMI.   Objective:  Physical Exam      Assessment And Plan:    There are no diagnoses linked to this encounter.    Patient was given opportunity to ask questions. Patient verbalized understanding of the plan and was able to repeat key elements of the plan. All questions were answered to their satisfaction.   Coolidge Breeze, CMA    I, Coolidge Breeze, CMA, have reviewed all documentation for this visit. The documentation on 07/06/22 for the exam, diagnosis, procedures, and orders are all accurate and complete.  THE PATIENT IS ENCOURAGED TO PRACTICE SOCIAL DISTANCING DUE TO THE COVID-19 PANDEMIC.

## 2022-07-14 ENCOUNTER — Ambulatory Visit (HOSPITAL_BASED_OUTPATIENT_CLINIC_OR_DEPARTMENT_OTHER): Payer: Commercial Managed Care - PPO | Admitting: Cardiovascular Disease

## 2022-07-16 NOTE — Progress Notes (Signed)
This encounter was created in error - please disregard.

## 2022-07-20 ENCOUNTER — Other Ambulatory Visit (HOSPITAL_COMMUNITY): Payer: Self-pay

## 2022-07-28 ENCOUNTER — Other Ambulatory Visit (HOSPITAL_COMMUNITY): Payer: Self-pay

## 2022-07-28 ENCOUNTER — Other Ambulatory Visit: Payer: Self-pay | Admitting: Internal Medicine

## 2022-07-28 MED ORDER — BREZTRI AEROSPHERE 160-9-4.8 MCG/ACT IN AERO
2.0000 | INHALATION_SPRAY | Freq: Two times a day (BID) | RESPIRATORY_TRACT | 2 refills | Status: DC
  Filled 2022-07-31: qty 10.7, 30d supply, fill #0
  Filled 2022-10-11: qty 10.7, 30d supply, fill #1
  Filled 2022-12-06: qty 10.7, 30d supply, fill #2

## 2022-07-28 MED ORDER — FAMOTIDINE 40 MG PO TABS
40.0000 mg | ORAL_TABLET | Freq: Two times a day (BID) | ORAL | 12 refills | Status: DC
  Filled 2022-07-28: qty 60, 30d supply, fill #0
  Filled 2023-01-30: qty 60, 30d supply, fill #1

## 2022-07-31 ENCOUNTER — Other Ambulatory Visit (HOSPITAL_COMMUNITY): Payer: Self-pay

## 2022-08-03 ENCOUNTER — Ambulatory Visit (INDEPENDENT_AMBULATORY_CARE_PROVIDER_SITE_OTHER): Payer: Commercial Managed Care - PPO | Admitting: Family Medicine

## 2022-08-03 ENCOUNTER — Encounter: Payer: Self-pay | Admitting: Family Medicine

## 2022-08-03 VITALS — BP 140/80 | HR 78 | Temp 98.5°F | Ht 72.0 in | Wt 177.0 lb

## 2022-08-03 DIAGNOSIS — Z720 Tobacco use: Secondary | ICD-10-CM | POA: Diagnosis not present

## 2022-08-03 DIAGNOSIS — R7309 Other abnormal glucose: Secondary | ICD-10-CM | POA: Diagnosis not present

## 2022-08-03 DIAGNOSIS — R300 Dysuria: Secondary | ICD-10-CM

## 2022-08-03 DIAGNOSIS — J4489 Other specified chronic obstructive pulmonary disease: Secondary | ICD-10-CM | POA: Diagnosis not present

## 2022-08-03 DIAGNOSIS — I10 Essential (primary) hypertension: Secondary | ICD-10-CM

## 2022-08-03 DIAGNOSIS — Z0000 Encounter for general adult medical examination without abnormal findings: Secondary | ICD-10-CM | POA: Diagnosis not present

## 2022-08-03 DIAGNOSIS — Z23 Encounter for immunization: Secondary | ICD-10-CM | POA: Diagnosis not present

## 2022-08-03 DIAGNOSIS — Z2821 Immunization not carried out because of patient refusal: Secondary | ICD-10-CM

## 2022-08-03 DIAGNOSIS — Z125 Encounter for screening for malignant neoplasm of prostate: Secondary | ICD-10-CM | POA: Diagnosis not present

## 2022-08-03 LAB — POCT URINALYSIS DIPSTICK
Bilirubin, UA: NEGATIVE
Glucose, UA: NEGATIVE
Ketones, UA: NEGATIVE
Leukocytes, UA: NEGATIVE
Nitrite, UA: NEGATIVE
Protein, UA: NEGATIVE
Spec Grav, UA: >=1.03 — AB (ref 1.010–1.025)
Urobilinogen, UA: 0.2 E.U./dL
pH, UA: 6.5 (ref 5.0–8.0)

## 2022-08-03 NOTE — Progress Notes (Signed)
I,Amarri Michaelson,acting as a scribe for [employer], NP.,have documented all relevant documentation on the behalf of Isayah Ignasiak, NP,as directed by  Omega Durante Moshe Salisbury, NP while in the presence of Keigan Tafoya [facility], NP.  Subjective:   [name] , male    DOB: 1950-06-03 , 72 y.o.   MRN: [id number]  Chief Complaint  Patient presents with   Annual Exam    HPI  Patient presents today for Annual physical, patient reports compliance with medications and has no concerns today. Patient denies any chest pain, SOB, headaches. Patient states that his COPD has been stable , no recent flare ups but states he has missed his follow up with the pulmonologist, patient was advised to reschedule as soon as possible.     Past Medical History:  Diagnosis Date   Arthritis    Bullous emphysema (HCC)    Severe Bilateral   Cocaine abuse (HCC)    Colitis    with bleeding   COPD (chronic obstructive pulmonary disease) (HCC)    Coronary artery disease    Dyspnea    GERD (gastroesophageal reflux disease)    Headache    sinusitis   Hypertension    LBP (low back pain)    now chronic   Lumbago    Lung mass    Right Upper Lobe pleural based mass, negative  on PET Scan   Neck pain    nerve pain    Pneumonia    hx   S/P angioplasty with stent 01/13/20 emergent DES to mLAD  01/15/2020   Shortness of breath 03/03/2020   Spine pain    nerve   Therapeutic drug monitoring    Tobacco abuse      Family History  Adopted: Yes  Problem Relation Age of Onset   Cancer Father    Lung cancer Father    Heart failure Sister    Breast cancer Sister    Emphysema Mother      Current Outpatient Medications:    acetaminophen (TYLENOL) 325 MG tablet, Take 2 tablets (650 mg total) by mouth every 4 (four) hours as needed for headache or mild pain., Disp: , Rfl:    albuterol (PROVENTIL) (2.5 MG/3ML) 0.083% nebulizer solution, Take 3 mLs (2.5 mg total) by nebulization every 6 (six) hours as needed for wheezing or  shortness of breath., Disp: 75 mL, Rfl: 2   aspirin 81 MG chewable tablet, Chew 1 tablet (81 mg total) by mouth daily., Disp: , Rfl:    atorvastatin (LIPITOR) 80 MG tablet, Take 1 tablet (80 mg total) by mouth daily., Disp: 90 tablet, Rfl: 1   benzonatate (TESSALON PERLES) 100 MG capsule, Take 1 capsule (100 mg total) by mouth every 6 (six) hours as needed., Disp: 30 capsule, Rfl: 1   Budeson-Glycopyrrol-Formoterol (BREZTRI AEROSPHERE) 160-9-4.8 MCG/ACT AERO, Inhale 2 puffs into the lungs 2 (two) times daily., Disp: 10.7 g, Rfl: 2   buPROPion (WELLBUTRIN XL) 150 MG 24 hr tablet, Take 1 tablet (150 mg total) by mouth daily., Disp: 90 tablet, Rfl: 1   Carboxymethylcellul-Glycerin (CLEAR EYES FOR DRY EYES OP), Place 1 drop into both eyes daily as needed (for dry eyes)., Disp: , Rfl:    diclofenac Sodium (VOLTAREN) 1 % GEL, Apply 2 g topically 2 (two) times daily as needed (joint pain)., Disp: 100 g, Rfl: 2   famotidine (PEPCID) 40 MG tablet, Take 1 tablet (40 mg total) by mouth 2 (two) times daily., Disp: 60 tablet, Rfl: 12   ipratropium-albuterol (  DUONEB) 0.5-2.5 (3) MG/3ML SOLN, Inhale 1 vial via nebulizer 4 times a day (Patient taking differently: Take 3 mLs by nebulization as needed.), Disp: 270 mL, Rfl: PRN   metoprolol succinate (TOPROL XL) 25 MG 24 hr tablet, Take 0.5 tablets (12.5 mg total) by mouth daily., Disp: 45 tablet, Rfl: 3   montelukast (SINGULAIR) 10 MG tablet, Take 1 tablet (10 mg total) by mouth every evening., Disp: 30 tablet, Rfl: 11   nicotine (NICODERM CQ - DOSED IN MG/24 HOURS) 14 mg/24hr patch, Place 14 mg onto the skin daily., Disp: , Rfl:    nitroGLYCERIN (NITROSTAT) 0.4 MG SL tablet, Place 1 tablet (0.4 mg total) under the tongue every 5 (five) minutes as needed for chest pain., Disp: 25 tablet, Rfl: 4   pantoprazole (PROTONIX) 40 MG tablet, Take 1 tablet (40 mg total) by mouth daily. STOP OMEPRAZOLE, Disp: 90 tablet, Rfl: 3   pregabalin (LYRICA) 50 MG capsule, Take 1 capsule  (50 mg total) by mouth 2 (two) times daily., Disp: 180 capsule, Rfl: 1   Respiratory Therapy Supplies (FLUTTER) DEVI, Use as directed, Disp: 1 each, Rfl: 0   tamsulosin (FLOMAX) 0.4 MG CAPS capsule, Take 2 capsules (0.8 mg total) by mouth at bedtime. Pt needs follow up for additional refills, Disp: 30 capsule, Rfl: 0   valsartan (DIOVAN) 160 MG tablet, Take 1 tablet (160 mg total) by mouth daily., Disp: 30 tablet, Rfl: 11   albuterol (VENTOLIN HFA) 108 (90 Base) MCG/ACT inhaler, INHALE 2 PUFFS INTO THE LUNGS EVERY 6 (SIX) HOURS AS NEEDED FOR WHEEZING OR SHORTNESS OF BREATH., Disp: 8.5 g, Rfl: 2   Allergies  Allergen Reactions   Penicillins Anaphylaxis and Hives    Has patient had a PCN reaction causing immediate rash, facial/tongue/throat swelling, SOB or lightheadedness with hypotension: No Has patient had a PCN reaction causing severe rash involving mucus membranes or skin necrosis: No Has patient had a PCN reaction that required hospitalization No Has patient had a PCN reaction occurring within the last 10 years: No If all of the above answers are "NO", then may proceed with Cephalosporin use.       Men's preventive visit. Patient Health Questionnaire (PHQ-2) is  Flowsheet Row Office Visit from 08/03/2022 in [facility]  PHQ-2 Total Score 0      Social History   Substance and Sexual Activity  Alcohol Use Yes   Comment: scotch once a week  . Relevant history for tobacco use is:  Social History   Tobacco Use  Smoking Status Every Day   Packs/day: 0.25   Years: 45.00   Additional pack years: 0.00   Total pack years: 11.25   Types: Cigarettes  Smokeless Tobacco Never  Tobacco Comments   Pt states that he smoke 5 ciggs a day as of 03/08/2022 LW   Pt chewing nicorette gum as of  03/08/2022 LW   04/22/20 Patient given 1-800-quit-now  and the link to [facility] health virtual smoking cessation link      Pt is smoking 10 ciggs daily as of 02/03/2022 LW   .   Review of Systems  Constitutional: Negative.   HENT:  Positive for tinnitus.   Eyes: Negative.   Respiratory: Negative.  Negative for chest tightness and shortness of breath.   Cardiovascular: Negative.   Gastrointestinal: Negative.   Endocrine: Negative.   Genitourinary: Negative.   Musculoskeletal: Negative.   Skin: Negative.   Allergic/Immunologic: Negative.   Hematological: Negative.   Psychiatric/Behavioral: Negative.  Today's Vitals   08/03/22 0852  BP: (!) 140/80  Pulse: 78  Temp: 98.5 F (36.9 C)  TempSrc: Oral  Weight: 177 lb (80.3 kg)  Height: 6' (1.829 m)  PainSc: 0-No pain   Body mass index is 24.01 kg/m.  Wt Readings from Last 3 Encounters:  08/03/22 177 lb (80.3 kg)  03/14/22 183 lb (83 kg)  03/09/22 183 lb (83 kg)    Objective:  Physical Exam HENT:     Head: Normocephalic.     Nose: Nose normal.  Cardiovascular:     Rate and Rhythm: Normal rate.  Pulmonary:     Breath sounds: Examination of the right-middle field reveals wheezing. Wheezing present.     Comments: wheezing was noted on expiration Abdominal:     General: Bowel sounds are normal.  Musculoskeletal:        General: Normal range of motion.  Skin:    General: Skin is warm.  Neurological:     General: No focal deficit present.     Mental Status: He is alert and oriented to person, place, and time.  Psychiatric:        Mood and Affect: Mood normal.         Assessment And Plan:    1. Encounter for annual health examination  2. Essential hypertension, benign Comments: No changes to current treatment plan. - EKG 12-Lead - Microalbumin / creatinine urine ratio - POCT Urinalysis Dipstick (81002) - CMP14+EGFR - Lipid panel - CBC no Diff  3. COPD with chronic bronchitis Comments: Chronic. Continue treatment plan and follow up with Pulmonologist  4. Other abnormal glucose Comments: Patient advised to limit consumption of concentrated sweets /carbohydrates. -  Hemoglobin A1c  5. Herpes zoster vaccination declined  6. Immunization declined  7. COVID-19 vaccination declined  8. Tobacco abuse Comments: Counseling offered.  9. Need for Tdap vaccination - Tdap vaccine greater than or equal to 7yo IM  10. Screening PSA (prostate specific antigen) - PSA    Return in 6 months (on 02/02/2023) for blood pressure, 1 year for physical. Patient was given opportunity to ask questions. Patient verbalized understanding of the plan and was able to repeat key elements of the plan. All questions were answered to their satisfaction.   Darric Plante Moshe Salisbury, NP  I, Chika Cichowski Moshe Salisbury, NP, have reviewed all documentation for this visit. The documentation on 08/07/22 for the exam, diagnosis, procedures, and orders are all accurate and complete.

## 2022-08-03 NOTE — Patient Instructions (Signed)
Health Maintenance, Male Adopting a healthy lifestyle and getting preventive care are important in promoting health and wellness. Ask your health care provider about: The right schedule for you to have regular tests and exams. Things you can do on your own to prevent diseases and keep yourself healthy. What should I know about diet, weight, and exercise? Eat a healthy diet  Eat a diet that includes plenty of vegetables, fruits, low-fat dairy products, and lean protein. Do not eat a lot of foods that are high in solid fats, added sugars, or sodium. Maintain a healthy weight Body mass index (BMI) is a measurement that can be used to identify possible weight problems. It estimates body fat based on height and weight. Your health care provider can help determine your BMI and help you achieve or maintain a healthy weight. Get regular exercise Get regular exercise. This is one of the most important things you can do for your health. Most adults should: Exercise for at least 150 minutes each week. The exercise should increase your heart rate and make you sweat (moderate-intensity exercise). Do strengthening exercises at least twice a week. This is in addition to the moderate-intensity exercise. Spend less time sitting. Even light physical activity can be beneficial. Watch cholesterol and blood lipids Have your blood tested for lipids and cholesterol at 70 years of age, then have this test every 5 years. You may need to have your cholesterol levels checked more often if: Your lipid or cholesterol levels are high. You are older than 70 years of age. You are at high risk for heart disease. What should I know about cancer screening? Many types of cancers can be detected early and may often be prevented. Depending on your health history and family history, you may need to have cancer screening at various ages. This may include screening for: Colorectal cancer. Prostate cancer. Skin cancer. Lung  cancer. What should I know about heart disease, diabetes, and high blood pressure? Blood pressure and heart disease High blood pressure causes heart disease and increases the risk of stroke. This is more likely to develop in people who have high blood pressure readings or are overweight. Talk with your health care provider about your target blood pressure readings. Have your blood pressure checked: Every 3-5 years if you are 18-39 years of age. Every year if you are 40 years old or older. If you are between the ages of 65 and 75 and are a current or former smoker, ask your health care provider if you should have a one-time screening for abdominal aortic aneurysm (AAA). Diabetes Have regular diabetes screenings. This checks your fasting blood sugar level. Have the screening done: Once every three years after age 45 if you are at a normal weight and have a low risk for diabetes. More often and at a younger age if you are overweight or have a high risk for diabetes. What should I know about preventing infection? Hepatitis B If you have a higher risk for hepatitis B, you should be screened for this virus. Talk with your health care provider to find out if you are at risk for hepatitis B infection. Hepatitis C Blood testing is recommended for: Everyone born from 1945 through 1965. Anyone with known risk factors for hepatitis C. Sexually transmitted infections (STIs) You should be screened each year for STIs, including gonorrhea and chlamydia, if: You are sexually active and are younger than 70 years of age. You are older than 70 years of age and your   health care provider tells you that you are at risk for this type of infection. Your sexual activity has changed since you were last screened, and you are at increased risk for chlamydia or gonorrhea. Ask your health care provider if you are at risk. Ask your health care provider about whether you are at high risk for HIV. Your health care provider  may recommend a prescription medicine to help prevent HIV infection. If you choose to take medicine to prevent HIV, you should first get tested for HIV. You should then be tested every 3 months for as long as you are taking the medicine. Follow these instructions at home: Alcohol use Do not drink alcohol if your health care provider tells you not to drink. If you drink alcohol: Limit how much you have to 0-2 drinks a day. Know how much alcohol is in your drink. In the [place] one drink equals one 12 oz bottle of beer (355 mL), one 5 oz glass of wine (148 mL), or one 1 oz glass of hard liquor (44 mL). Lifestyle Do not use any products that contain nicotine or tobacco. These products include cigarettes, chewing tobacco, and vaping devices, such as e-cigarettes. If you need help quitting, ask your health care provider. Do not use street drugs. Do not share needles. Ask your health care provider for help if you need support or information about quitting drugs. General instructions Schedule regular health, dental, and eye exams. Stay current with your vaccines. Tell your health care provider if: You often feel depressed. You have ever been abused or do not feel safe at home. Summary Adopting a healthy lifestyle and getting preventive care are important in promoting health and wellness. Follow your health care provider's instructions about healthy diet, exercising, and getting tested or screened for diseases. Follow your health care provider's instructions on monitoring your cholesterol and blood pressure. This information is not intended to replace advice given to you by your health care provider. Make sure you discuss any questions you have with your health care provider. Document Revised: 07/05/2020 Document Reviewed: 07/05/2020 Elsevier Patient Education  2024 [employer].  

## 2022-08-04 ENCOUNTER — Encounter: Payer: Medicare Other | Admitting: Family Medicine

## 2022-08-04 LAB — CMP14+EGFR
ALT: 22 IU/L (ref 0–44)
AST: 24 IU/L (ref 0–40)
Albumin/Globulin Ratio: 1.5 (ref 1.2–2.2)
Albumin: 4.3 g/dL (ref 3.9–4.9)
Alkaline Phosphatase: 116 IU/L (ref 44–121)
BUN/Creatinine Ratio: 10 (ref 10–24)
BUN: 11 mg/dL (ref 8–27)
Bilirubin Total: 0.7 mg/dL (ref 0.0–1.2)
CO2: 22 mmol/L (ref 20–29)
Calcium: 9.7 mg/dL (ref 8.6–10.2)
Chloride: 104 mmol/L (ref 96–106)
Creatinine, Ser: 1.11 mg/dL (ref 0.76–1.27)
Globulin, Total: 2.9 g/dL (ref 1.5–4.5)
Glucose: 106 mg/dL — ABNORMAL HIGH (ref 70–99)
Potassium: 4.3 mmol/L (ref 3.5–5.2)
Sodium: 142 mmol/L (ref 134–144)
Total Protein: 7.2 g/dL (ref 6.0–8.5)
eGFR: 71 mL/min/{1.73_m2} (ref >59)

## 2022-08-04 LAB — CBC
Hematocrit: 48.7 % (ref 37.5–51.0)
Hemoglobin: 15.9 g/dL (ref 13.0–17.7)
MCH: 28 pg (ref 26.6–33.0)
MCHC: 32.6 g/dL (ref 31.5–35.7)
MCV: 86 fL (ref 79–97)
Platelets: 221 10*3/uL (ref 150–450)
RBC: 5.67 x10E6/uL (ref 4.14–5.80)
RDW: 14.2 % (ref 11.6–15.4)
WBC: 6.7 10*3/uL (ref 3.4–10.8)

## 2022-08-04 LAB — LIPID PANEL
Chol/HDL Ratio: 2.4 ratio (ref 0.0–5.0)
Cholesterol, Total: 146 mg/dL (ref 100–199)
HDL: 62 mg/dL (ref >39)
LDL Chol Calc (NIH): 72 mg/dL (ref 0–99)
Triglycerides: 57 mg/dL (ref 0–149)
VLDL Cholesterol Cal: 12 mg/dL (ref 5–40)

## 2022-08-04 LAB — PSA: Prostate Specific Ag, Serum: 0.7 ng/mL (ref 0.0–4.0)

## 2022-08-04 LAB — HEMOGLOBIN A1C
Est. average glucose Bld gHb Est-mCnc: 137 mg/dL
Hgb A1c MFr Bld: 6.4 % — ABNORMAL HIGH (ref 4.8–5.6)

## 2022-08-06 LAB — MICROALBUMIN / CREATININE URINE RATIO
Creatinine, Urine: 177.7 mg/dL
Microalb/Creat Ratio: 7 mg/g creat (ref 0–29)
Microalbumin, Urine: 12.8 ug/mL

## 2022-08-07 ENCOUNTER — Encounter: Payer: Self-pay | Admitting: Family Medicine

## 2022-08-07 NOTE — Assessment & Plan Note (Signed)
No changes to current treatment plan

## 2022-08-07 NOTE — Assessment & Plan Note (Signed)
Chronic. Continue treatment plan and follow up with Pulmonologist

## 2022-08-07 NOTE — Assessment & Plan Note (Signed)
Patient advised to limit consumption of concentrated sweets /carbohydrates.

## 2022-08-07 NOTE — Assessment & Plan Note (Signed)
Counseling offered. 

## 2022-09-07 ENCOUNTER — Other Ambulatory Visit: Payer: Self-pay

## 2022-09-07 ENCOUNTER — Other Ambulatory Visit (HOSPITAL_COMMUNITY): Payer: Self-pay

## 2022-09-07 ENCOUNTER — Other Ambulatory Visit: Payer: Self-pay | Admitting: Internal Medicine

## 2022-09-07 MED ORDER — ATORVASTATIN CALCIUM 80 MG PO TABS
80.0000 mg | ORAL_TABLET | Freq: Every day | ORAL | 2 refills | Status: DC
  Filled 2022-09-07: qty 90, 90d supply, fill #0

## 2022-09-07 MED ORDER — TAMSULOSIN HCL 0.4 MG PO CAPS
0.8000 mg | ORAL_CAPSULE | Freq: Every day | ORAL | 1 refills | Status: DC
  Filled 2022-09-07: qty 30, 15d supply, fill #0
  Filled 2022-10-10: qty 30, 15d supply, fill #1

## 2022-09-22 ENCOUNTER — Other Ambulatory Visit (HOSPITAL_COMMUNITY): Payer: Self-pay

## 2022-10-02 ENCOUNTER — Ambulatory Visit (INDEPENDENT_AMBULATORY_CARE_PROVIDER_SITE_OTHER): Payer: Commercial Managed Care - PPO | Admitting: Internal Medicine

## 2022-10-02 ENCOUNTER — Encounter: Payer: Self-pay | Admitting: Internal Medicine

## 2022-10-02 VITALS — BP 110/76 | HR 87 | Temp 98.1°F | Ht 72.0 in | Wt 172.2 lb

## 2022-10-02 DIAGNOSIS — I700 Atherosclerosis of aorta: Secondary | ICD-10-CM | POA: Diagnosis not present

## 2022-10-02 DIAGNOSIS — T63441A Toxic effect of venom of bees, accidental (unintentional), initial encounter: Secondary | ICD-10-CM | POA: Insufficient documentation

## 2022-10-02 DIAGNOSIS — F17210 Nicotine dependence, cigarettes, uncomplicated: Secondary | ICD-10-CM

## 2022-10-02 DIAGNOSIS — R634 Abnormal weight loss: Secondary | ICD-10-CM | POA: Insufficient documentation

## 2022-10-02 DIAGNOSIS — T63441D Toxic effect of venom of bees, accidental (unintentional), subsequent encounter: Secondary | ICD-10-CM | POA: Diagnosis not present

## 2022-10-02 NOTE — Assessment & Plan Note (Signed)
Chronic, LDL goal < 70.  He will c/w ASA 81mg  daily, along with atorvastatin 80mg  daily. He is encouraged to follow a heart healthy lifestyle.

## 2022-10-02 NOTE — Assessment & Plan Note (Addendum)
He has lost 8 lbs since Nov 2023. He denies change in appetite and reports having three meals daily. DRE performed, stool heme negative. I will recheck labs as below. He has extensive smoking history, yet he has yet to get CT chest that has been ordered on two occasions. Spoke with referral coordinator, radiology office has contacted pt on multiple occasions to schedule the appt. [facility] was contacted, they will again reach out to the patient.

## 2022-10-02 NOTE — Assessment & Plan Note (Signed)
Occurred two weeks ago, stung on ankles. States he was cutting neighbors yard and he ran over them. he states they were yellow hornets.  He does not have any bee allergies. Advised to take Zyrtec if he has repeat occurrence.

## 2022-10-02 NOTE — Assessment & Plan Note (Signed)
Importance of smoking cessation was discussed with the patient in detail.  Smoking cessation instruction/counseling given:  counseled patient on the dangers of tobacco use, advised patient to stop smoking, and reviewed strategies to maximize success.  He is again reminded to move forward with LDCT testing.

## 2022-10-02 NOTE — Patient Instructions (Signed)

## 2022-10-02 NOTE — Progress Notes (Signed)
I,Victoria T Deloria Lair, CMA,acting as a Neurosurgeon for [name], MD.,have documented all relevant documentation on the behalf of [name], MD,as directed by  [name], MD while in the presence of [name], MD.  Subjective:  [name] , male    DOB: 10/11/1950 , 72 y.o.   MRN: [id number]  Chief Complaint  Patient presents with   Weight Loss    HPI  Patient presents today wanting to discuss unintentional weight loss. He reports initially noticing this 2 months ago. He feels  this is quick weight loss within a 2 month span. He does not know what test should be ran or what specialist he should see to address this issue.  He states this weight makes him feel " uncomfortable".   He reports also being stung by 12-15 bees. Running over a underground Secretary/administrator.      Past Medical History:  Diagnosis Date   Arthritis    Bullous emphysema (HCC)    Severe Bilateral   Cocaine abuse (HCC)    Colitis    with bleeding   COPD (chronic obstructive pulmonary disease) (HCC)    Coronary artery disease    Dyspnea    GERD (gastroesophageal reflux disease)    Headache    sinusitis   Hypertension    LBP (low back pain)    now chronic   Lumbago    Lung mass    Right Upper Lobe pleural based mass, negative  on PET Scan   Neck pain    nerve pain    Pneumonia    hx   S/P angioplasty with stent 01/13/20 emergent DES to mLAD  01/15/2020   Shortness of breath 03/03/2020   Spine pain    nerve   Therapeutic drug monitoring    Tobacco abuse      Family History  Adopted: Yes  Problem Relation Age of Onset   Cancer Father    Lung cancer Father    Heart failure Sister    Breast cancer Sister    Emphysema Mother      Current Outpatient Medications:    acetaminophen (TYLENOL) 325 MG tablet, Take 2 tablets (650 mg total) by mouth every 4 (four) hours as needed for headache or mild pain., Disp: , Rfl:    albuterol (PROVENTIL) (2.5 MG/3ML) 0.083% nebulizer  solution, Take 3 mLs (2.5 mg total) by nebulization every 6 (six) hours as needed for wheezing or shortness of breath., Disp: 75 mL, Rfl: 2   aspirin 81 MG chewable tablet, Chew 1 tablet (81 mg total) by mouth daily., Disp: , Rfl:    atorvastatin (LIPITOR) 80 MG tablet, Take 1 tablet (80 mg total) by mouth daily., Disp: 90 tablet, Rfl: 2   Budeson-Glycopyrrol-Formoterol (BREZTRI AEROSPHERE) 160-9-4.8 MCG/ACT AERO, Inhale 2 puffs into the lungs 2 (two) times daily., Disp: 10.7 g, Rfl: 2   Carboxymethylcellul-Glycerin (CLEAR EYES FOR DRY EYES OP), Place 1 drop into both eyes daily as needed (for dry eyes)., Disp: , Rfl:    diclofenac Sodium (VOLTAREN) 1 % GEL, Apply 2 g topically 2 (two) times daily as needed (joint pain)., Disp: 100 g, Rfl: 2   famotidine (PEPCID) 40 MG tablet, Take 1 tablet (40 mg total) by mouth 2 (two) times daily., Disp: 60 tablet, Rfl: 12   ipratropium-albuterol (DUONEB) 0.5-2.5 (3) MG/3ML SOLN, Inhale 1 vial via nebulizer 4 times a day (Patient taking differently: Take 3 mLs by nebulization as needed.), Disp: 270  mL, Rfl: PRN   metoprolol succinate (TOPROL XL) 25 MG 24 hr tablet, Take 0.5 tablets (12.5 mg total) by mouth daily., Disp: 45 tablet, Rfl: 3   montelukast (SINGULAIR) 10 MG tablet, Take 1 tablet (10 mg total) by mouth every evening., Disp: 30 tablet, Rfl: 11   nitroGLYCERIN (NITROSTAT) 0.4 MG SL tablet, Place 1 tablet (0.4 mg total) under the tongue every 5 (five) minutes as needed for chest pain., Disp: 25 tablet, Rfl: 4   pantoprazole (PROTONIX) 40 MG tablet, Take 1 tablet (40 mg total) by mouth daily. STOP OMEPRAZOLE, Disp: 90 tablet, Rfl: 3   pregabalin (LYRICA) 50 MG capsule, Take 1 capsule (50 mg total) by mouth 2 (two) times daily., Disp: 180 capsule, Rfl: 1   Respiratory Therapy Supplies (FLUTTER) DEVI, Use as directed, Disp: 1 each, Rfl: 0   tamsulosin (FLOMAX) 0.4 MG CAPS capsule, Take 2 capsules (0.8 mg total) by mouth at bedtime. Pt needs follow up for  additional refills, Disp: 30 capsule, Rfl: 1   valsartan (DIOVAN) 160 MG tablet, Take 1 tablet (160 mg total) by mouth daily., Disp: 30 tablet, Rfl: 11   albuterol (VENTOLIN HFA) 108 (90 Base) MCG/ACT inhaler, INHALE 2 PUFFS INTO THE LUNGS EVERY 6 (SIX) HOURS AS NEEDED FOR WHEEZING OR SHORTNESS OF BREATH., Disp: 8.5 g, Rfl: 2   benzonatate (TESSALON PERLES) 100 MG capsule, Take 1 capsule (100 mg total) by mouth every 6 (six) hours as needed. (Patient not taking: Reported on 10/02/2022), Disp: 30 capsule, Rfl: 1   buPROPion (WELLBUTRIN XL) 150 MG 24 hr tablet, Take 1 tablet (150 mg total) by mouth daily. (Patient not taking: Reported on 10/02/2022), Disp: 90 tablet, Rfl: 1   nicotine (NICODERM CQ - DOSED IN MG/24 HOURS) 14 mg/24hr patch, Place 14 mg onto the skin daily. (Patient not taking: Reported on 10/02/2022), Disp: , Rfl:    Allergies  Allergen Reactions   Penicillins Anaphylaxis and Hives    Has patient had a PCN reaction causing immediate rash, facial/tongue/throat swelling, SOB or lightheadedness with hypotension: No Has patient had a PCN reaction causing severe rash involving mucus membranes or skin necrosis: No Has patient had a PCN reaction that required hospitalization No Has patient had a PCN reaction occurring within the last 10 years: No If all of the above answers are "NO", then may proceed with Cephalosporin use.       Review of Systems  Constitutional:  Positive for unexpected weight change. Negative for appetite change.  HENT: Negative.    Respiratory: Negative.    Cardiovascular: Negative.   Skin: Negative.   Allergic/Immunologic: Negative.   Neurological: Negative.   Hematological: Negative.      Today's Vitals   10/02/22 1552  BP: 110/76  Pulse: 87  Temp: 98.1 F (36.7 C)  SpO2: 98%  Weight: 172 lb 3.2 oz (78.1 kg)  Height: 6' (1.829 m)   Body mass index is 23.35 kg/m.  Wt Readings from Last 3 Encounters:  10/02/22 172 lb 3.2 oz (78.1 kg)  08/03/22 177 lb  (80.3 kg)  03/14/22 183 lb (83 kg)     Objective:  Physical Exam Vitals and nursing note reviewed. Exam conducted with a chaperone present.  Constitutional:      Appearance: Normal appearance.  HENT:     Head: Normocephalic and atraumatic.  Eyes:     Extraocular Movements: Extraocular movements intact.  Cardiovascular:     Rate and Rhythm: Normal rate and regular rhythm.     Heart  sounds: Normal heart sounds.  Pulmonary:     Effort: Pulmonary effort is normal.     Breath sounds: Normal breath sounds.  Genitourinary:    Rectum: Normal. Guaiac result negative.  Musculoskeletal:     Cervical back: Normal range of motion.  Skin:    General: Skin is warm.  Neurological:     General: No focal deficit present.     Mental Status: He is alert.  Psychiatric:        Mood and Affect: Mood normal.         Assessment And Plan:  Unintentional weight loss Assessment & Plan: He has lost 8 lbs since Nov 2023. He denies change in appetite and reports having three meals daily. DRE performed, stool heme negative. I will recheck labs as below. He has extensive smoking history, yet he has yet to get CT chest that has been ordered on two occasions. Spoke with referral coordinator, radiology office has contacted pt on multiple occasions to schedule the appt. [facility] was contacted, they will again reach out to the patient.   Orders: -     TSH -     CBC -     POC Hemoccult Bld/Stl (1-Cd Office Dx)  Bee sting, accidental or unintentional, subsequent encounter Assessment & Plan: Occurred two weeks ago, stung on ankles. States he was cutting neighbors yard and he ran over them. he states they were yellow hornets.  He does not have any bee allergies. Advised to take Zyrtec if he has repeat occurrence.      Cigarette smoker Assessment & Plan: Importance of smoking cessation was discussed with the patient in detail.  Smoking cessation instruction/counseling given:  counseled patient on the  dangers of tobacco use, advised patient to stop smoking, and reviewed strategies to maximize success.  He is again reminded to move forward with LDCT testing.     Aortic atherosclerosis (HCC) Assessment & Plan: Chronic, LDL goal < 70.  He will c/w ASA 81mg  daily, along with atorvastatin 80mg  daily. He is encouraged to follow a heart healthy lifestyle.       Return if symptoms worsen or fail to improve.  Patient was given opportunity to ask questions. Patient verbalized understanding of the plan and was able to repeat key elements of the plan. All questions were answered to their satisfaction.    I, [name], MD, have reviewed all documentation for this visit. The documentation on 10/02/22 for the exam, diagnosis, procedures, and orders are all accurate and complete.   IF YOU HAVE BEEN REFERRED TO A SPECIALIST, IT MAY TAKE 1-2 WEEKS TO SCHEDULE/PROCESS THE REFERRAL. IF YOU HAVE NOT HEARD FROM US/SPECIALIST IN TWO WEEKS, PLEASE GIVE [place] A CALL AT [phone].   THE PATIENT IS ENCOURAGED TO PRACTICE SOCIAL DISTANCING DUE TO THE COVID-19 PANDEMIC.

## 2022-10-11 ENCOUNTER — Other Ambulatory Visit (HOSPITAL_COMMUNITY): Payer: Self-pay

## 2022-10-13 ENCOUNTER — Ambulatory Visit
Admission: RE | Admit: 2022-10-13 | Discharge: 2022-10-13 | Disposition: A | Discharge status: Home / Self Care | Payer: Commercial Managed Care - PPO | Source: Ambulatory Visit | Attending: Internal Medicine | Admitting: Internal Medicine

## 2022-10-13 DIAGNOSIS — F17210 Nicotine dependence, cigarettes, uncomplicated: Secondary | ICD-10-CM

## 2022-10-24 ENCOUNTER — Other Ambulatory Visit (HOSPITAL_COMMUNITY): Payer: Self-pay

## 2022-10-25 ENCOUNTER — Other Ambulatory Visit: Payer: Self-pay | Admitting: Internal Medicine

## 2022-10-25 ENCOUNTER — Other Ambulatory Visit: Payer: Self-pay

## 2022-10-25 DIAGNOSIS — R634 Abnormal weight loss: Secondary | ICD-10-CM

## 2022-10-25 DIAGNOSIS — R911 Solitary pulmonary nodule: Secondary | ICD-10-CM

## 2022-11-01 ENCOUNTER — Telehealth (HOSPITAL_BASED_OUTPATIENT_CLINIC_OR_DEPARTMENT_OTHER): Payer: Self-pay | Admitting: Pulmonary Disease

## 2022-11-01 NOTE — Telephone Encounter (Signed)
Patient is an established patient with Dr. Vassie Loll, new referral sent over for lung nodule found on low dose lung cancer screening CT, per Dr. Allyne Gee, "CT chest: there is a lung nodule that is suspicious.  I will refer you to Pulmonary for further evaluation. Ideally, we need to move forward for bronchoscopy - procedure to get tissue sampling "  Please advise is patient needs to be seen in office, with BI or RB or if a bronch can be scheduled.

## 2022-11-03 NOTE — Telephone Encounter (Signed)
Patient returned call. Appt scheduled with Dr. Vassie Loll for 9/9 at 3:30 at [facility] location. Patient aware to call back with any questions or concerns.

## 2022-11-03 NOTE — Telephone Encounter (Signed)
Attempted to contact patient. Left message for pt to call back to schedule appt with Dr Vassie Loll.

## 2022-11-03 NOTE — Telephone Encounter (Addendum)
Dr. Vassie Loll, please advise. Patient had LDCT on 8/16 ordered by PCP which resulted as a Lung RADS 4B.

## 2022-11-06 ENCOUNTER — Encounter (HOSPITAL_BASED_OUTPATIENT_CLINIC_OR_DEPARTMENT_OTHER): Payer: Self-pay | Admitting: Pulmonary Disease

## 2022-11-06 ENCOUNTER — Ambulatory Visit (HOSPITAL_BASED_OUTPATIENT_CLINIC_OR_DEPARTMENT_OTHER): Payer: Commercial Managed Care - PPO | Admitting: Pulmonary Disease

## 2022-11-06 ENCOUNTER — Other Ambulatory Visit (HOSPITAL_COMMUNITY): Payer: Self-pay

## 2022-11-06 VITALS — BP 108/62 | HR 84 | Resp 16 | Ht 72.0 in | Wt 174.1 lb

## 2022-11-06 DIAGNOSIS — R911 Solitary pulmonary nodule: Secondary | ICD-10-CM | POA: Diagnosis not present

## 2022-11-06 DIAGNOSIS — Z720 Tobacco use: Secondary | ICD-10-CM | POA: Diagnosis not present

## 2022-11-06 DIAGNOSIS — J439 Emphysema, unspecified: Secondary | ICD-10-CM

## 2022-11-06 MED ORDER — NICOTINE 10 MG IN INHA
1.0000 | RESPIRATORY_TRACT | 0 refills | Status: DC | PRN
  Filled 2022-11-06: qty 168, fill #0

## 2022-11-06 NOTE — Assessment & Plan Note (Signed)
New finding of right middle lobe nodule 16 mm with moderate to high risk of malignancy in this smoker. Will proceed with PET scan.  If this is hypermetabolic on PET scan, we discussed option of biopsy with bronchoscopy given location and airway leading to the nodule.  He would be willing to proceed. We will also obtain PFTs to assess whether he would be a candidate for surgical resection. 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

## 2022-11-06 NOTE — Assessment & Plan Note (Signed)
Smoking cessation was again emphasized as the most important intervention.  He is willing to try Nicotrol inhaler

## 2022-11-06 NOTE — Assessment & Plan Note (Addendum)
Reassess with PFTs. Continue Breztri.

## 2022-11-06 NOTE — Progress Notes (Signed)
   Subjective:    [name], male    DOB: 08/20/1950, 72 y.o.   MRN: [id number]  HPI  72 yo smoker for FU of COPD exacerbation. 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 Suspicious and wanted it evaluated by Dr Vassie Loll and she wants him to have bronchoscopy. Declines flu shot today.    Last seen 02/2022-I advised him to obtain PFTs so that we could pursue whether he is a candidate for endobronchial valve, but he was not interested. He underwent low-dose CT chest-this showed 16mm right middle lobe nodule  He continues to smoke about 5 cigarettes daily.  He has lost 7 pounds over the past 8 months.  He is compliant with Breztri  Significant tests/ events reviewed   PFT's 02/02/2017 FEV1 2.71 (82 % ) ratio 75 p 12 % improvement from saba p nothing prior to study with DLCO 55/56 % corrects to 81 % for alv volume      CTA chest 06/2014 centrilobular and paraseptal emphysema. A large bleb is at the left apex  Areas of chronic mild bronchiectasis and bronchial wall thickening most evident in the lower lobes   CTA chest 11/2019 prominent bullae in the apices, stable. stable lentiform shaped opacity abutting the pleura in the lateral aspect of the left upper lobe, measuring 5.5 x 1.1 cm, stable compared to the prior study from 2019 and smaller than on the 2016 study  LDCT 09/2022 >> mod emphysema, RML nodule 16 mm   6.3 x 1.7 cm pleural-based lesion in the anterior right hemithorax chronic, benign , 10 mm   subcarinal node,mild subpleural reticulation/fibrosis fibrosis in the lungs bilaterally       Review of Systems neg for any significant sore throat, dysphagia, itching, sneezing, nasal congestion  or excess/ purulent secretions, fever, chills, sweats, unintended wt loss, pleuritic or exertional cp, hempoptysis, orthopnea pnd or change in chronic leg swelling. Also denies presyncope, palpitations, heartburn, abdominal pain, nausea, vomiting, diarrhea or change in bowel or urinary habits, dysuria,hematuria, rash, arthralgias, visual complaints, headache, numbness weakness or ataxia.     Objective:   Physical Exam  Gen. Pleasant, well-nourished, in no distress ENT - no thrush, no pallor/icterus,no post nasal drip Neck: No JVD, no thyromegaly, no carotid bruits Lungs: no use of accessory muscles, no dullness to percussion, clear without rales or rhonchi  Cardiovascular: Rhythm regular, heart sounds  normal, no murmurs or gallops, no peripheral edema Musculoskeletal: No deformities, no cyanosis or clubbing         Assessment & Plan:   Plan of care was discussed with patient and his wife in detail

## 2022-11-06 NOTE — Patient Instructions (Signed)
X PET scan  X schedule pFTs  Based on this, we will decide about biopsy

## 2022-11-07 ENCOUNTER — Other Ambulatory Visit (HOSPITAL_COMMUNITY): Payer: Self-pay

## 2022-11-07 ENCOUNTER — Other Ambulatory Visit (HOSPITAL_BASED_OUTPATIENT_CLINIC_OR_DEPARTMENT_OTHER): Payer: Self-pay | Admitting: Family

## 2022-11-07 ENCOUNTER — Other Ambulatory Visit: Payer: Self-pay | Admitting: Internal Medicine

## 2022-11-07 MED ORDER — METOPROLOL SUCCINATE ER 25 MG PO TB24
12.5000 mg | ORAL_TABLET | Freq: Every day | ORAL | 2 refills | Status: DC
  Filled 2022-11-07: qty 45, 90d supply, fill #0
  Filled 2023-01-30: qty 45, 90d supply, fill #1
  Filled 2023-04-30: qty 45, 90d supply, fill #2
  Filled 2023-07-27: qty 45, 90d supply, fill #3
  Filled 2023-10-25: qty 45, 90d supply, fill #4

## 2022-11-07 MED ORDER — TAMSULOSIN HCL 0.4 MG PO CAPS
0.8000 mg | ORAL_CAPSULE | Freq: Every day | ORAL | 2 refills | Status: DC
  Filled 2022-11-07: qty 180, 90d supply, fill #0
  Filled 2023-04-30: qty 90, 45d supply, fill #1

## 2022-11-08 ENCOUNTER — Other Ambulatory Visit (HOSPITAL_COMMUNITY): Payer: Self-pay

## 2022-11-14 ENCOUNTER — Encounter (HOSPITAL_BASED_OUTPATIENT_CLINIC_OR_DEPARTMENT_OTHER): Payer: Commercial Managed Care - PPO

## 2022-11-21 ENCOUNTER — Ambulatory Visit (HOSPITAL_BASED_OUTPATIENT_CLINIC_OR_DEPARTMENT_OTHER): Payer: Commercial Managed Care - PPO | Admitting: Pulmonary Disease

## 2022-11-21 DIAGNOSIS — J439 Emphysema, unspecified: Secondary | ICD-10-CM

## 2022-11-21 LAB — PULMONARY FUNCTION TEST
DL/VA % pred: 86 %
DL/VA: 3.48 ml/min/mmHg/L
DLCO cor % pred: 62 %
DLCO cor: 17.34 ml/min/mmHg
DLCO unc % pred: 65 %
DLCO unc: 18.04 ml/min/mmHg
FEF 25-75 Post: 1.46 L/s
FEF 25-75 Pre: 1.36 L/s
FEF2575-%Change-Post: 7 %
FEF2575-%Pred-Post: 54 %
FEF2575-%Pred-Pre: 51 %
FEV1-%Change-Post: 2 %
FEV1-%Pred-Post: 67 %
FEV1-%Pred-Pre: 66 %
FEV1-Post: 2.37 L
FEV1-Pre: 2.32 L
FEV1FVC-%Change-Post: 2 %
FEV1FVC-%Pred-Pre: 93 %
FEV6-%Change-Post: 0 %
FEV6-%Pred-Post: 72 %
FEV6-%Pred-Pre: 73 %
FEV6-Post: 3.28 L
FEV6-Pre: 3.3 L
FEV6FVC-%Change-Post: 0 %
FEV6FVC-%Pred-Post: 102 %
FEV6FVC-%Pred-Pre: 102 %
FVC-%Change-Post: 0 %
FVC-%Pred-Post: 70 %
FVC-%Pred-Pre: 71 %
FVC-Post: 3.38 L
FVC-Pre: 3.39 L
Post FEV1/FVC ratio: 70 %
Post FEV6/FVC ratio: 97 %
Pre FEV1/FVC ratio: 69 %
Pre FEV6/FVC Ratio: 97 %
RV % pred: 81 %
RV: 2.07 L
TLC % pred: 75 %
TLC: 5.63 L

## 2022-11-21 NOTE — Patient Instructions (Signed)
Full PFT Performed Today  

## 2022-11-21 NOTE — Progress Notes (Signed)
Full PFT Performed Today  

## 2022-11-23 ENCOUNTER — Encounter (HOSPITAL_COMMUNITY)
Admission: RE | Admit: 2022-11-23 | Discharge: 2022-11-23 | Disposition: A | Discharge status: Home / Self Care | Payer: Commercial Managed Care - PPO | Source: Ambulatory Visit | Attending: Pulmonary Disease | Admitting: Pulmonary Disease

## 2022-11-23 DIAGNOSIS — R911 Solitary pulmonary nodule: Secondary | ICD-10-CM | POA: Diagnosis not present

## 2022-11-23 LAB — GLUCOSE, CAPILLARY: Glucose-Capillary: 107 mg/dL — ABNORMAL HIGH (ref 70–99)

## 2022-11-23 MED ORDER — FLUDEOXYGLUCOSE F - 18 (FDG) INJECTION
8.0000 | Freq: Once | INTRAVENOUS | Status: AC | PRN
  Administered 2022-11-23: 8.76 via INTRAVENOUS

## 2022-11-27 ENCOUNTER — Other Ambulatory Visit (HOSPITAL_COMMUNITY): Payer: Self-pay

## 2022-12-01 ENCOUNTER — Telehealth (HOSPITAL_BASED_OUTPATIENT_CLINIC_OR_DEPARTMENT_OTHER): Payer: Self-pay | Admitting: Pulmonary Disease

## 2022-12-01 DIAGNOSIS — R911 Solitary pulmonary nodule: Secondary | ICD-10-CM

## 2022-12-05 NOTE — Telephone Encounter (Signed)
Discussed PET & PFT results with pt & his wife Discussed options of surgery vs radiation - Make appt with [facility]-order placed

## 2022-12-06 ENCOUNTER — Other Ambulatory Visit (HOSPITAL_COMMUNITY): Payer: Self-pay

## 2022-12-06 ENCOUNTER — Telehealth (HOSPITAL_BASED_OUTPATIENT_CLINIC_OR_DEPARTMENT_OTHER): Payer: Self-pay | Admitting: Pulmonary Disease

## 2022-12-06 NOTE — Telephone Encounter (Signed)
Patient's wife notified it has been sent to pharmacy. They may need to call and have it filled but rx is on file with [facility].

## 2022-12-06 NOTE — Telephone Encounter (Signed)
Patient wife called in patient would like to try the Nicotrol Inhaler now. Has this already been called in? Wanted to confirm. Please advise and call patients wife if needed   Pharmacy: [facility]

## 2022-12-14 NOTE — Progress Notes (Signed)
[address]       [place] [address]             [phone]                    KORI MCKEE [facility] Medical Record #[id number] Date of Birth: 1950/07/05  Referring: Oretha Milch, MD Primary Care: Dorothyann Peng, MD Primary Cardiologist: Chilton Si, MD  Chief Complaint:   No chief complaint on file.   History of Present Illness:    JUNAYD SALAZAR 72 y.o. male presents for surgical evaluation of a right middle lobe pulmonary nodule.  He is a long time smoker, and currently smokes 1/2 ppd.  This was originally, identified in the lung cancer screening program.  He subsequently underwent a PET CT which showed avidity.  He has had some weight loss, and dizziness over the past several months.       Past Medical History:  Diagnosis Date   Arthritis    Bullous emphysema (HCC)    Severe Bilateral   Cocaine abuse (HCC)    Colitis    with bleeding   COPD (chronic obstructive pulmonary disease) (HCC)    Coronary artery disease    Dyspnea    GERD (gastroesophageal reflux disease)    Headache    sinusitis   Hypertension    LBP (low back pain)    now chronic   Lumbago    Lung mass    Right Upper Lobe pleural based mass, negative  on PET Scan   Neck pain    nerve pain    Pneumonia    hx   S/P angioplasty with stent 01/13/20 emergent DES to mLAD  01/15/2020   Shortness of breath 03/03/2020   Spine pain    nerve   Therapeutic drug monitoring    Tobacco abuse     Past Surgical History:  Procedure Laterality Date   CARDIAC CATHETERIZATION     COLONOSCOPY     CORONARY/GRAFT ACUTE MI REVASCULARIZATION N/A 01/14/2020   Procedure: Coronary/Graft Acute MI Revascularization;  Surgeon: Tonny Bollman, MD;  Location: [facility] INVASIVE CV LAB;  Service: Cardiovascular;  Laterality: N/A;   LEFT HEART CATH AND CORONARY ANGIOGRAPHY N/A 01/14/2020   Procedure: LEFT HEART CATH AND CORONARY ANGIOGRAPHY;  Surgeon: Tonny Bollman, MD;  Location: [facility]  INVASIVE CV LAB;  Service: Cardiovascular;  Laterality: N/A;   LEFT HEART CATH AND CORONARY ANGIOGRAPHY N/A 12/13/2020   Procedure: LEFT HEART CATH AND CORONARY ANGIOGRAPHY;  Surgeon: Tonny Bollman, MD;  Location: [facility] INVASIVE CV LAB;  Service: Cardiovascular;  Laterality: N/A;   LUMBAR LAMINECTOMY/DECOMPRESSION MICRODISCECTOMY Right 05/03/2016   Procedure: MICRODISCECTOMY LUMBAR FIVE- SACRAL ONE RIGHT;  Surgeon: Coletta Memos, MD;  Location: [facility] OR;  Service: Neurosurgery;  Laterality: Right;   NO PAST SURGERIES      Family History  Adopted: Yes  Problem Relation Age of Onset   Cancer Father    Lung cancer Father    Heart failure Sister    Breast cancer Sister    Emphysema Mother      Social History   Tobacco Use  Smoking Status Every Day   Current packs/day: 0.25   Average packs/day: 0.3 packs/day for 45.0 years (11.3 ttl pk-yrs)   Types: Cigarettes   Passive exposure: Current  Smokeless Tobacco Never  Tobacco Comments   Pt states that he smoke 5 ciggs a day as of 03/08/2022 LW   Pt chewing nicorette gum  as of  03/08/2022 LW   04/22/20 Patient given 1-800-quit-now  and the link to [facility] health virtual smoking cessation link      Pt is smoking 10 ciggs daily as of 02/03/2022 LW    Social History   Substance and Sexual Activity  Alcohol Use Yes   Comment: scotch once a week     Allergies  Allergen Reactions   Penicillins Anaphylaxis and Hives    Has patient had a PCN reaction causing immediate rash, facial/tongue/throat swelling, SOB or lightheadedness with hypotension: No Has patient had a PCN reaction causing severe rash involving mucus membranes or skin necrosis: No Has patient had a PCN reaction that required hospitalization No Has patient had a PCN reaction occurring within the last 10 years: No If all of the above answers are "NO", then may proceed with Cephalosporin use.      Current Outpatient Medications  Medication Sig Dispense Refill   acetaminophen  (TYLENOL) 325 MG tablet Take 2 tablets (650 mg total) by mouth every 4 (four) hours as needed for headache or mild pain.     albuterol (PROVENTIL) (2.5 MG/3ML) 0.083% nebulizer solution Take 3 mLs (2.5 mg total) by nebulization every 6 (six) hours as needed for wheezing or shortness of breath. 75 mL 2   aspirin 81 MG chewable tablet Chew 1 tablet (81 mg total) by mouth daily.     atorvastatin (LIPITOR) 80 MG tablet Take 1 tablet (80 mg total) by mouth daily. 90 tablet 2   benzonatate (TESSALON PERLES) 100 MG capsule Take 1 capsule (100 mg total) by mouth every 6 (six) hours as needed. 30 capsule 1   Budeson-Glycopyrrol-Formoterol (BREZTRI AEROSPHERE) 160-9-4.8 MCG/ACT AERO Inhale 2 puffs into the lungs 2 (two) times daily. 10.7 g 2   buPROPion (WELLBUTRIN XL) 150 MG 24 hr tablet Take 1 tablet (150 mg total) by mouth daily. 90 tablet 1   diclofenac Sodium (VOLTAREN) 1 % GEL Apply 2 g topically 2 (two) times daily as needed (joint pain). 100 g 2   famotidine (PEPCID) 40 MG tablet Take 1 tablet (40 mg total) by mouth 2 (two) times daily. 60 tablet 12   ipratropium-albuterol (DUONEB) 0.5-2.5 (3) MG/3ML SOLN Inhale 1 vial via nebulizer 4 times a day (Patient taking differently: Take 3 mLs by nebulization as needed.) 270 mL PRN   metoprolol succinate (TOPROL XL) 25 MG 24 hr tablet Take 1/2 tablet (12.5 mg total) by mouth daily. 90 tablet 2   montelukast (SINGULAIR) 10 MG tablet Take 1 tablet (10 mg total) by mouth every evening. 30 tablet 11   nitroGLYCERIN (NITROSTAT) 0.4 MG SL tablet Place 1 tablet (0.4 mg total) under the tongue every 5 (five) minutes as needed for chest pain. 25 tablet 4   pantoprazole (PROTONIX) 40 MG tablet Take 1 tablet (40 mg total) by mouth daily. STOP OMEPRAZOLE 90 tablet 3   pregabalin (LYRICA) 50 MG capsule Take 1 capsule (50 mg total) by mouth 2 (two) times daily. 180 capsule 1   Respiratory Therapy Supplies (FLUTTER) DEVI Use as directed 1 each 0   tamsulosin (FLOMAX) 0.4 MG  CAPS capsule Take 2 capsules (0.8 mg total) by mouth at bedtime. Pt needs follow up for additional refills 90 capsule 2   valsartan (DIOVAN) 160 MG tablet Take 1 tablet (160 mg total) by mouth daily. 30 tablet 11   albuterol (VENTOLIN HFA) 108 (90 Base) MCG/ACT inhaler INHALE 2 PUFFS INTO THE LUNGS EVERY 6 (SIX) HOURS AS NEEDED FOR WHEEZING  OR SHORTNESS OF BREATH. 8.5 g 2   nicotine (NICOTROL) 10 MG inhaler Inhale 1 Cartridge (1 continuous puffing total) into the lungs as needed for smoking cessation. (Patient not taking: Reported on 12/15/2022) 42 each 0   No current facility-administered medications for this visit.    Review of Systems  Constitutional:  Positive for weight loss. Negative for malaise/fatigue.  Respiratory:  Positive for cough.   Cardiovascular:  Negative for chest pain.  Neurological:  Positive for dizziness.     PHYSICAL EXAMINATION: BP 109/70 (BP Location: Left Arm, Patient Position: Sitting)   Pulse 69   Resp 18   Ht 6' (1.829 m)   Wt 173 lb (78.5 kg)   SpO2 96% Comment: RA  BMI 23.46 kg/m  Physical Exam Constitutional:      General: He is not in acute distress.    Appearance: Normal appearance. He is not ill-appearing.  HENT:     Head: Normocephalic and atraumatic.  Eyes:     Extraocular Movements: Extraocular movements intact.  Cardiovascular:     Rate and Rhythm: Normal rate.  Pulmonary:     Effort: Pulmonary effort is normal. No respiratory distress.  Abdominal:     General: Abdomen is flat. There is no distension.  Musculoskeletal:        General: Normal range of motion.     Cervical back: Normal range of motion.  Skin:    General: Skin is warm and dry.  Neurological:     General: No focal deficit present.     Mental Status: He is alert and oriented to person, place, and time.          I have independently reviewed the above radiology studies  and reviewed the findings with the patient.   Recent Lab Findings: Lab Results  Component  Value Date   WBC 7.1 10/02/2022   HGB 16.1 10/02/2022   HCT 48.6 10/02/2022   PLT 225 10/02/2022   GLUCOSE 106 (H) 08/03/2022   CHOL 146 08/03/2022   TRIG 57 08/03/2022   HDL 62 08/03/2022   LDLCALC 72 08/03/2022   ALT 22 08/03/2022   AST 24 08/03/2022   NA 142 08/03/2022   K 4.3 08/03/2022   CL 104 08/03/2022   CREATININE 1.11 08/03/2022   BUN 11 08/03/2022   CO2 22 08/03/2022   TSH 1.820 10/02/2022   INR 1.0 01/14/2020   HGBA1C 6.4 (H) 08/03/2022    Diagnostic Studies & Laboratory data:     Recent Radiology Findings:   NM PET Image Initial (PI) Skull Base To Thigh  Result Date: 12/05/2022 CLINICAL DATA:  Initial treatment strategy for pulmonary nodule. EXAM: NUCLEAR MEDICINE PET SKULL BASE TO THIGH TECHNIQUE: 8.8 mCi F-18 FDG was injected intravenously. Full-ring PET imaging was performed from the skull base to thigh after the radiotracer. CT data was obtained and used for attenuation correction and anatomic localization. Fasting blood glucose: 107 mg/dl COMPARISON:  CT chest dated 10/13/2022 FINDINGS: Mediastinal blood pool activity: SUV max 2.6 Liver activity: SUV max NA NECK: No hypermetabolic lymph nodes in the neck. Incidental CT findings: None. CHEST: 2.4 x 1.2 cm irregular right middle lobe nodule (series 7/image 45), max SUV 10.7. Stable pleural-based lesion in the anterior right hemithorax (series 4/image 62), non FDG avid, benign. No hypermetabolic thoracic lymphadenopathy. Incidental CT findings: Status post left upper lobe wedge resection. Atherosclerotic calcifications of the aortic arch. Moderate coronary atherosclerosis of the LAD. ABDOMEN/PELVIS: No abnormal hypermetabolic activity within the liver, pancreas, adrenal  glands, or spleen. No hypermetabolic lymph nodes in the abdomen or pelvis. Incidental CT findings: Scattered colonic diverticulosis, without evidence of diverticulitis. Atherosclerotic calcifications of the abdominal aorta and branch vessels. SKELETON: No  focal hypermetabolic activity to suggest skeletal metastasis. Incidental CT findings: Degenerative changes of the lumbar spine. IMPRESSION: 2.4 cm irregular right middle lobe nodule, compatible with primary bronchogenic carcinoma. No evidence of metastatic disease. Electronically Signed   By: Charline Bills M.D.   On: 12/05/2022 00:09     PFTs:  - FVC: 71% - FEV1: 66% -DLCO: 62%     Assessment / Plan:   72 yo male with right middle lobe pulmonary nodule.  Descent PFTs, but severely emphysematous lungs.  He is hesitant to proceed with surgery now that we have discussed it in detail.  He would like to obtain a biopsy to help him make a decision.  I have made him an appointment with interventional pulm to discuss a navigational biopsy.  I will see him in follow-up to discuss the results.     I  spent 40 minutes with  the patient face to face in counseling and coordination of care.    [name] 12/15/2022 11:28 AM

## 2022-12-15 ENCOUNTER — Telehealth: Payer: Self-pay | Admitting: Pulmonary Disease

## 2022-12-15 ENCOUNTER — Institutional Professional Consult (permissible substitution): Payer: Commercial Managed Care - PPO | Admitting: Thoracic Surgery (Cardiothoracic Vascular Surgery)

## 2022-12-15 ENCOUNTER — Other Ambulatory Visit (HOSPITAL_COMMUNITY): Payer: Self-pay

## 2022-12-15 VITALS — BP 109/70 | HR 69 | Resp 18 | Ht 72.0 in | Wt 173.0 lb

## 2022-12-15 DIAGNOSIS — R911 Solitary pulmonary nodule: Secondary | ICD-10-CM | POA: Diagnosis not present

## 2022-12-15 NOTE — Telephone Encounter (Signed)
Patient states pharmacy does not have the nicotine stick. Would like the lozenges or gum. Having surgery and needs to stop smoking. Pharmacy is [facility] [address] [facility]. Patient phone number is [phone].

## 2022-12-18 ENCOUNTER — Other Ambulatory Visit (HOSPITAL_COMMUNITY): Payer: Self-pay

## 2022-12-18 ENCOUNTER — Telehealth: Payer: Self-pay | Admitting: Pulmonary Disease

## 2022-12-18 NOTE — Telephone Encounter (Signed)
Patient's wife would like a call back. Patient needs smoking session tools, and a plan of care since he has a cancerous tumor.

## 2022-12-19 ENCOUNTER — Other Ambulatory Visit: Payer: Self-pay | Admitting: Internal Medicine

## 2022-12-19 ENCOUNTER — Other Ambulatory Visit (HOSPITAL_COMMUNITY): Payer: Self-pay

## 2022-12-19 MED ORDER — NICOTINE POLACRILEX 2 MG MT LOZG
2.0000 mg | LOZENGE | OROMUCOSAL | 0 refills | Status: AC
  Filled 2022-12-19: qty 81, 7d supply, fill #0
  Filled 2022-12-20: qty 72, 6d supply, fill #0

## 2022-12-19 NOTE — Telephone Encounter (Signed)
Patient's spouse, Tammy(DPR) and relayed below message. Encouraged Tammy to keep scheduled visit to discuss further. She became upset and stated that is a 50 co pay. I explained that an appt is needed, as we can not treat over the phone. Tammy stated that this is "ass backwards" and she would like to speak to someone. Routing to [employer] for follow up.

## 2022-12-19 NOTE — Telephone Encounter (Signed)
He has an appointment with Dr. Vassie Loll  next week, can go over all the steps next week and more cessatation options

## 2022-12-19 NOTE — Telephone Encounter (Signed)
Spoke to patient's spouse, Tammy(DPR). She would like smoking cessation information. I have provided Tammy with [facility] tobacco cessation contact number of [phone].   Tammy stated that patient was referred to surgeon and was advised that surgery could not be preformed at this time, first a bx will be preformed. Surgeon also suggested smoking cessation but did not provide patient with smoking cessation tools. Tammy stated that Nicotrol is no longer available. She is concerned about possible dx and would like to know next steps.   Dr. Vassie Loll, please advise. Thanks.

## 2022-12-20 ENCOUNTER — Other Ambulatory Visit: Payer: Self-pay

## 2022-12-20 ENCOUNTER — Other Ambulatory Visit (HOSPITAL_COMMUNITY): Payer: Self-pay

## 2022-12-21 ENCOUNTER — Telehealth (HOSPITAL_BASED_OUTPATIENT_CLINIC_OR_DEPARTMENT_OTHER): Payer: Self-pay | Admitting: Pulmonary Disease

## 2022-12-21 ENCOUNTER — Other Ambulatory Visit: Payer: Self-pay

## 2022-12-21 NOTE — Telephone Encounter (Signed)
Patients wife called in and states she will have FMLA faxed to [place] from [employer]. Will document once received

## 2022-12-21 NOTE — Telephone Encounter (Signed)
Patient's PCP sent in nicotine gum per his request.

## 2022-12-22 ENCOUNTER — Telehealth (HOSPITAL_COMMUNITY): Payer: Self-pay | Admitting: Licensed Clinical Social Worker

## 2022-12-22 NOTE — Telephone Encounter (Signed)
CSW contacted patient's wife to offer resources and answer financial questions. Patient's wife shared recent diagnosis of suspected lung cancer and spoke at length about frustration with healthcare journey. Wife shared multiple co pays and medical expenses and concerns about future abilities to cover the cost of medical needs.   Patient is currently smoking and will need to quit before able to have surgery and she shared that they were both able to get appointments with the [facility] smoking cessation program. Patient has an appointment next week and she will be the following week. She noted the importance of both of them quitting as "it will not help him if I continue to smoke myself".   CSW provided supportive intervention and discussed resources for nicotine patches. She reports she was able to get nicotine lozenges under the insurance and hopeful for relief with them. CSW also discussed contacting [facility] for further assistance with multiple medical bills for both she and patient. Wife appreciative of the call and the resources. CSW provided contact information and available as needed.

## 2022-12-22 NOTE — Telephone Encounter (Signed)
CSW contacted patient to follow up on a request to assist with financial concerns. CSW left message for return call. Lasandra Beech, LCSW, CCSW-MCS [phone]

## 2022-12-22 NOTE — Telephone Encounter (Signed)
Received paperwork for FMLA and forms have been faxed to [employer] [address] and has been confirmed. Routing to [employer] as an Financial planner

## 2022-12-25 ENCOUNTER — Other Ambulatory Visit: Payer: Self-pay | Admitting: Internal Medicine

## 2022-12-25 NOTE — Telephone Encounter (Signed)
Pt's spouse, Tammy (DPR) is aware of below message/recommendations.  She stated that patient's PCP called in something to help with smoking cessation. Pt will keep scheduled visit with Dr. Delton Coombes for 11/1. Nothing further needed.

## 2022-12-26 ENCOUNTER — Ambulatory Visit (HOSPITAL_BASED_OUTPATIENT_CLINIC_OR_DEPARTMENT_OTHER): Payer: Commercial Managed Care - PPO | Admitting: Pulmonary Disease

## 2022-12-27 ENCOUNTER — Ambulatory Visit (HOSPITAL_BASED_OUTPATIENT_CLINIC_OR_DEPARTMENT_OTHER): Payer: Commercial Managed Care - PPO | Admitting: Pulmonary Disease

## 2022-12-29 ENCOUNTER — Encounter: Payer: Self-pay | Admitting: Emergency Medicine

## 2022-12-29 ENCOUNTER — Other Ambulatory Visit (HOSPITAL_COMMUNITY): Payer: Self-pay

## 2022-12-29 ENCOUNTER — Telehealth: Payer: Self-pay | Admitting: Emergency Medicine

## 2022-12-29 ENCOUNTER — Ambulatory Visit (INDEPENDENT_AMBULATORY_CARE_PROVIDER_SITE_OTHER): Payer: Commercial Managed Care - PPO | Admitting: Emergency Medicine

## 2022-12-29 VITALS — BP 104/66 | HR 72 | Ht 72.0 in | Wt 173.0 lb

## 2022-12-29 DIAGNOSIS — F17210 Nicotine dependence, cigarettes, uncomplicated: Secondary | ICD-10-CM | POA: Diagnosis not present

## 2022-12-29 DIAGNOSIS — J4489 Other specified chronic obstructive pulmonary disease: Secondary | ICD-10-CM | POA: Diagnosis not present

## 2022-12-29 DIAGNOSIS — R911 Solitary pulmonary nodule: Secondary | ICD-10-CM | POA: Diagnosis not present

## 2022-12-29 NOTE — Assessment & Plan Note (Signed)
Pulmonary Nodule Suspicious right middle lobe nodule with hypermetabolism on PET scan. Discussed the risks/benefits of surgical resection vs navigational bronchoscopy. Given the patient's significant emphysema, decision was made to pursue navigational bronchoscopy for tissue diagnosis. -Schedule navigational bronchoscopy on January 22, 2023. -Stop aspirin two days prior to the procedure. -Obtain a high-resolution CT scan of the chest prior to the procedure.

## 2022-12-29 NOTE — Assessment & Plan Note (Signed)
Tobacco Use Patient reports smoking 5-6 cigarettes per day and has cut down significantly. Currently using gum and lozenges to aid in cessation. -Encourage continued efforts to quit smoking.

## 2022-12-29 NOTE — Telephone Encounter (Signed)
Has further questions regarding the biopsy. She is concerned about putting off the biopsy until 11/25 as that is 6 weeks since they found out his nodule is cancerous. She also wants to know if they can try and get the resuction done prior to to the end of the year for insurance purposes. She is aware that we will likely not have an answer until next week. Please advise.

## 2022-12-29 NOTE — Progress Notes (Signed)
Subjective:    [name], male    DOB: [birth year], 72 y.o.   MRN: [id number]  HPI Peter Sims, a 72 year old with a history of tobacco use and COPD, has been under observation for a pulmonary nodule detected on a CT scan. The patient has been followed for COPD and a noted pulmonary nodule. A lung cancer screening CT scan revealed a 6.3 by 1.7 cm pleural based anterior right lesion, stable on prior scans, and an irregular 16mm speculated posterior right middle lobe nodule abutting the major fissure, raising suspicion for primary lung cancer. A subsequent PET scan confirmed a hypermetabolic 2.4 by 1.2 cm irregular right middle lobe nodule, with no hypermetabolic thoracic lymphadenopathy.  The patient has been experiencing a mild cough, primarily when smoking, with clear to slightly yellow mucus and no hemoptysis. 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  The patient has been on a regimen of Breztri for COPD and has been using albuterol sparingly. The patient has also been taking a baby aspirin and metoprolol, which he believed to be a blood thinner. The patient has been trying to reduce smoking, currently down to about five or six cigarettes a day, and has been using gum and lozenges to aid in this effort.   RADIOLOGY CT scan of the chest: 6.3 x 1.7 cm pleural-based anterior right lesion, stable on prior scans; 16 mm spiculated posterior right middle lobe nodule abutting the major fissure, suspicious for primary lung cancer; 10 mm subcarinal node (10/13/2022) PET scan: Hypermetabolic 2.4 x 1.2 cm irregular right middle lobe nodule; stable pleural-based anterior right lesion with no hypermetabolism; no hypermetabolic thoracic lymphadenopathy (11/23/2022)  Review of Systems As per HPI  Past Medical History:  Diagnosis Date   Arthritis    Bullous emphysema (HCC)    Severe  Bilateral   Cocaine abuse (HCC)    Colitis    with bleeding   COPD (chronic obstructive pulmonary disease) (HCC)    Coronary artery disease    Dyspnea    GERD (gastroesophageal reflux disease)    Headache    sinusitis   Hypertension    LBP (low back pain)    now chronic   Lumbago    Lung mass    Right Upper Lobe pleural based mass, negative  on PET Scan   Neck pain    nerve pain    Pneumonia    hx   S/P angioplasty with stent 01/13/20 emergent DES to mLAD  01/15/2020   Shortness of breath 03/03/2020   Spine pain    nerve   Therapeutic drug monitoring    Tobacco abuse      Family History  Adopted: Yes  Problem Relation Age of Onset   Cancer Father    Lung cancer Father    Heart failure Sister    Breast cancer Sister    Emphysema Mother      Social History   Socioeconomic History   Marital status: Married    Spouse name: Not on file   Number of children: 4   Years of education: Not on file   Highest education level: Some college, no degree  Occupational History   Occupation: Designer, 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   Tobacco Use   Smoking status: Every Day    Current packs/day: 0.25    Average packs/day: 0.3 packs/day  for 45.0 years (11.3 ttl pk-yrs)    Types: Cigarettes    Passive exposure: Current   Smokeless tobacco: Never   Tobacco comments:    Pt states that he smoke 5 ciggs a day as of 03/08/2022 LW    Pt chewing nicorette gum as of  03/08/2022 LW    04/22/20 Patient given 1-800-quit-now  and the link to [facility] health virtual smoking cessation link        Pt is smoking 10 ciggs daily as of 02/03/2022 LW  Vaping Use   Vaping status: Never Used  Substance and Sexual Activity   Alcohol use: Yes    Comment: scotch once a week   Drug use: Yes    Types: Cocaine, Marijuana    Comment: crack cocaine quit 2003   Sexual activity: Not on file  Other Topics Concern   Not on file  Social History Narrative   Not on file    Social Determinants of Health   Financial Resource Strain: Not on file  Food Insecurity: Not on file  Transportation Needs: Not on file  Physical Activity: Not on file  Stress: Not on file  Social Connections: Not on file  Intimate Partner Violence: Not on file     Allergies  Allergen Reactions   Penicillins Anaphylaxis and Hives    Has patient had a PCN reaction causing immediate rash, facial/tongue/throat swelling, SOB or lightheadedness with hypotension: No Has patient had a PCN reaction causing severe rash involving mucus membranes or skin necrosis: No Has patient had a PCN reaction that required hospitalization No Has patient had a PCN reaction occurring within the last 10 years: No If all of the above answers are "NO", then may proceed with Cephalosporin use.       Outpatient Medications Prior to Visit  Medication Sig Dispense Refill   acetaminophen (TYLENOL) 325 MG tablet Take 2 tablets (650 mg total) by mouth every 4 (four) hours as needed for headache or mild pain.     albuterol (PROVENTIL) (2.5 MG/3ML) 0.083% nebulizer solution Take 3 mLs (2.5 mg total) by nebulization every 6 (six) hours as needed for wheezing or shortness of breath. 75 mL 2   aspirin 81 MG chewable tablet Chew 1 tablet (81 mg total) by mouth daily.     atorvastatin (LIPITOR) 80 MG tablet Take 1 tablet (80 mg total) by mouth daily. 90 tablet 2   benzonatate (TESSALON PERLES) 100 MG capsule Take 1 capsule (100 mg total) by mouth every 6 (six) hours as needed. 30 capsule 1   Budeson-Glycopyrrol-Formoterol (BREZTRI AEROSPHERE) 160-9-4.8 MCG/ACT AERO Inhale 2 puffs into the lungs 2 (two) times daily. 10.7 g 2   buPROPion (WELLBUTRIN XL) 150 MG 24 hr tablet Take 1 tablet (150 mg total) by mouth daily. 90 tablet 1   diclofenac Sodium (VOLTAREN) 1 % GEL Apply 2 g topically 2 (two) times daily as needed (joint pain). 100 g 2   famotidine (PEPCID) 40 MG tablet Take 1 tablet (40 mg total) by mouth 2 (two) times  daily. 60 tablet 12   ipratropium-albuterol (DUONEB) 0.5-2.5 (3) MG/3ML SOLN Inhale 1 vial via nebulizer 4 times a day (Patient taking differently: Take 3 mLs by nebulization as needed.) 270 mL PRN   metoprolol succinate (TOPROL XL) 25 MG 24 hr tablet Take 1/2 tablet (12.5 mg total) by mouth daily. 90 tablet 2   montelukast (SINGULAIR) 10 MG tablet Take 1 tablet (10 mg total) by mouth every evening. 30 tablet 11  nicotine (NICOTROL) 10 MG inhaler Inhale 1 Cartridge (1 continuous puffing total) into the lungs as needed for smoking cessation. 42 each 0   nicotine polacrilex (NICORETTE) 2 MG lozenge Use one lozenge every 2-3 hours as needed 72 tablet 0   nitroGLYCERIN (NITROSTAT) 0.4 MG SL tablet Place 1 tablet (0.4 mg total) under the tongue every 5 (five) minutes as needed for chest pain. 25 tablet 4   pantoprazole (PROTONIX) 40 MG tablet Take 1 tablet (40 mg total) by mouth daily. STOP OMEPRAZOLE 90 tablet 3   pregabalin (LYRICA) 50 MG capsule Take 1 capsule (50 mg total) by mouth 2 (two) times daily. 180 capsule 1   Respiratory Therapy Supplies (FLUTTER) DEVI Use as directed 1 each 0   tamsulosin (FLOMAX) 0.4 MG CAPS capsule Take 2 capsules (0.8 mg total) by mouth at bedtime. Pt needs follow up for additional refills 90 capsule 2   valsartan (DIOVAN) 160 MG tablet Take 1 tablet (160 mg total) by mouth daily. 30 tablet 11   albuterol (VENTOLIN HFA) 108 (90 Base) MCG/ACT inhaler INHALE 2 PUFFS INTO THE LUNGS EVERY 6 (SIX) HOURS AS NEEDED FOR WHEEZING OR SHORTNESS OF BREATH. 8.5 g 2   No facility-administered medications prior to visit.        Objective:   Physical Exam Vitals:   12/29/22 1128  BP: 104/66  Pulse: 72  SpO2: 94%  Weight: 173 lb (78.5 kg)  Height: 6' (1.829 m)   Gen: Pleasant, well-nourished, in no distress,  normal affect  ENT: No lesions,  mouth clear,  oropharynx clear, no postnasal drip  Neck: No JVD, no stridor  Lungs: No use of accessory muscles, no crackles or  wheezing on normal respiration, no wheeze on forced expiration  Cardiovascular: RRR, heart sounds normal, no murmur or gallops, no peripheral edema  Musculoskeletal: No deformities, no cyanosis or clubbing  Neuro: alert, awake, non focal  Skin: Warm, no lesions or rash       Assessment & Plan:  Pulmonary nodule 1 cm or greater in diameter Pulmonary Nodule Suspicious right middle lobe nodule with hypermetabolism on PET scan. Discussed the risks/benefits of surgical resection vs navigational bronchoscopy. Given the patient's significant emphysema, decision was made to pursue navigational bronchoscopy for tissue diagnosis. -Schedule navigational bronchoscopy on January 22, 2023. -Stop aspirin two days prior to the procedure. -Obtain a high-resolution CT scan of the chest prior to the procedure.  COPD with chronic bronchitis (HCC) COPD Stable with minimal coughing. Patient is on Breztri and uses albuterol infrequently. -Continue current management.  Cigarette smoker Tobacco Use Patient reports smoking 5-6 cigarettes per day and has cut down significantly. Currently using gum and lozenges to aid in cessation. -Encourage continued efforts to quit smoking.   Levy Pupa, MD, PhD 12/29/2022, 12:12 PM [facility] [phone] or if no answer before 7:00PM call [phone] For any issues after 7:00PM please call eLink [phone]

## 2022-12-29 NOTE — H&P (View-Only) (Signed)
Subjective:    [name], male    DOB: Jun 14, 1950, 72 y.o.   MRN: [id number]  HPI Peter Sims, a 72 year old with a history of tobacco use and COPD, has been under observation for a pulmonary nodule detected on a CT scan. The patient has been followed for COPD and a noted pulmonary nodule. A lung cancer screening CT scan revealed a 6.3 by 1.7 cm pleural based anterior right lesion, stable on prior scans, and an irregular 16mm speculated posterior right middle lobe nodule abutting the major fissure, raising suspicion for primary lung cancer. A subsequent PET scan confirmed a hypermetabolic 2.4 by 1.2 cm irregular right middle lobe nodule, with no hypermetabolic thoracic lymphadenopathy.  The patient has been experiencing a mild cough, primarily when smoking, with clear to slightly yellow mucus and no hemoptysis. 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  The patient has been on a regimen of Breztri for COPD and has been using albuterol sparingly. The patient has also been taking a baby aspirin and metoprolol, which he believed to be a blood thinner. The patient has been trying to reduce smoking, currently down to about five or six cigarettes a day, and has been using gum and lozenges to aid in this effort.   RADIOLOGY CT scan of the chest: 6.3 x 1.7 cm pleural-based anterior right lesion, stable on prior scans; 16 mm spiculated posterior right middle lobe nodule abutting the major fissure, suspicious for primary lung cancer; 10 mm subcarinal node (10/13/2022) PET scan: Hypermetabolic 2.4 x 1.2 cm irregular right middle lobe nodule; stable pleural-based anterior right lesion with no hypermetabolism; no hypermetabolic thoracic lymphadenopathy (11/23/2022)  Review of Systems As per HPI  Past Medical History:  Diagnosis Date   Arthritis    Bullous emphysema (HCC)    Severe  Bilateral   Cocaine abuse (HCC)    Colitis    with bleeding   COPD (chronic obstructive pulmonary disease) (HCC)    Coronary artery disease    Dyspnea    GERD (gastroesophageal reflux disease)    Headache    sinusitis   Hypertension    LBP (low back pain)    now chronic   Lumbago    Lung mass    Right Upper Lobe pleural based mass, negative  on PET Scan   Neck pain    nerve pain    Pneumonia    hx   S/P angioplasty with stent 01/13/20 emergent DES to mLAD  01/15/2020   Shortness of breath 03/03/2020   Spine pain    nerve   Therapeutic drug monitoring    Tobacco abuse      Family History  Adopted: Yes  Problem Relation Age of Onset   Cancer Father    Lung cancer Father    Heart failure Sister    Breast cancer Sister    Emphysema Mother      Social History   Socioeconomic History   Marital status: Married    Spouse name: Not on file   Number of children: 4   Years of education: Not on file   Highest education level: Some college, no degree  Occupational History   Occupation: Designer, 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   Tobacco Use   Smoking status: Every Day    Current packs/day: 0.25    Average packs/day: 0.3 packs/day  for 45.0 years (11.3 ttl pk-yrs)    Types: Cigarettes    Passive exposure: Current   Smokeless tobacco: Never   Tobacco comments:    Pt states that he smoke 5 ciggs a day as of 03/08/2022 LW    Pt chewing nicorette gum as of  03/08/2022 LW    04/22/20 Patient given 1-800-quit-now  and the link to [facility] health virtual smoking cessation link        Pt is smoking 10 ciggs daily as of 02/03/2022 LW  Vaping Use   Vaping status: Never Used  Substance and Sexual Activity   Alcohol use: Yes    Comment: scotch once a week   Drug use: Yes    Types: Cocaine, Marijuana    Comment: crack cocaine quit 2003   Sexual activity: Not on file  Other Topics Concern   Not on file  Social History Narrative   Not on file    Social Determinants of Health   Financial Resource Strain: Not on file  Food Insecurity: Not on file  Transportation Needs: Not on file  Physical Activity: Not on file  Stress: Not on file  Social Connections: Not on file  Intimate Partner Violence: Not on file     Allergies  Allergen Reactions   Penicillins Anaphylaxis and Hives    Has patient had a PCN reaction causing immediate rash, facial/tongue/throat swelling, SOB or lightheadedness with hypotension: No Has patient had a PCN reaction causing severe rash involving mucus membranes or skin necrosis: No Has patient had a PCN reaction that required hospitalization No Has patient had a PCN reaction occurring within the last 10 years: No If all of the above answers are "NO", then may proceed with Cephalosporin use.       Outpatient Medications Prior to Visit  Medication Sig Dispense Refill   acetaminophen (TYLENOL) 325 MG tablet Take 2 tablets (650 mg total) by mouth every 4 (four) hours as needed for headache or mild pain.     albuterol (PROVENTIL) (2.5 MG/3ML) 0.083% nebulizer solution Take 3 mLs (2.5 mg total) by nebulization every 6 (six) hours as needed for wheezing or shortness of breath. 75 mL 2   aspirin 81 MG chewable tablet Chew 1 tablet (81 mg total) by mouth daily.     atorvastatin (LIPITOR) 80 MG tablet Take 1 tablet (80 mg total) by mouth daily. 90 tablet 2   benzonatate (TESSALON PERLES) 100 MG capsule Take 1 capsule (100 mg total) by mouth every 6 (six) hours as needed. 30 capsule 1   Budeson-Glycopyrrol-Formoterol (BREZTRI AEROSPHERE) 160-9-4.8 MCG/ACT AERO Inhale 2 puffs into the lungs 2 (two) times daily. 10.7 g 2   buPROPion (WELLBUTRIN XL) 150 MG 24 hr tablet Take 1 tablet (150 mg total) by mouth daily. 90 tablet 1   diclofenac Sodium (VOLTAREN) 1 % GEL Apply 2 g topically 2 (two) times daily as needed (joint pain). 100 g 2   famotidine (PEPCID) 40 MG tablet Take 1 tablet (40 mg total) by mouth 2 (two) times  daily. 60 tablet 12   ipratropium-albuterol (DUONEB) 0.5-2.5 (3) MG/3ML SOLN Inhale 1 vial via nebulizer 4 times a day (Patient taking differently: Take 3 mLs by nebulization as needed.) 270 mL PRN   metoprolol succinate (TOPROL XL) 25 MG 24 hr tablet Take 1/2 tablet (12.5 mg total) by mouth daily. 90 tablet 2   montelukast (SINGULAIR) 10 MG tablet Take 1 tablet (10 mg total) by mouth every evening. 30 tablet 11  nicotine (NICOTROL) 10 MG inhaler Inhale 1 Cartridge (1 continuous puffing total) into the lungs as needed for smoking cessation. 42 each 0   nicotine polacrilex (NICORETTE) 2 MG lozenge Use one lozenge every 2-3 hours as needed 72 tablet 0   nitroGLYCERIN (NITROSTAT) 0.4 MG SL tablet Place 1 tablet (0.4 mg total) under the tongue every 5 (five) minutes as needed for chest pain. 25 tablet 4   pantoprazole (PROTONIX) 40 MG tablet Take 1 tablet (40 mg total) by mouth daily. STOP OMEPRAZOLE 90 tablet 3   pregabalin (LYRICA) 50 MG capsule Take 1 capsule (50 mg total) by mouth 2 (two) times daily. 180 capsule 1   Respiratory Therapy Supplies (FLUTTER) DEVI Use as directed 1 each 0   tamsulosin (FLOMAX) 0.4 MG CAPS capsule Take 2 capsules (0.8 mg total) by mouth at bedtime. Pt needs follow up for additional refills 90 capsule 2   valsartan (DIOVAN) 160 MG tablet Take 1 tablet (160 mg total) by mouth daily. 30 tablet 11   albuterol (VENTOLIN HFA) 108 (90 Base) MCG/ACT inhaler INHALE 2 PUFFS INTO THE LUNGS EVERY 6 (SIX) HOURS AS NEEDED FOR WHEEZING OR SHORTNESS OF BREATH. 8.5 g 2   No facility-administered medications prior to visit.        Objective:   Physical Exam Vitals:   12/29/22 1128  BP: 104/66  Pulse: 72  SpO2: 94%  Weight: 173 lb (78.5 kg)  Height: 6' (1.829 m)   Gen: Pleasant, well-nourished, in no distress,  normal affect  ENT: No lesions,  mouth clear,  oropharynx clear, no postnasal drip  Neck: No JVD, no stridor  Lungs: No use of accessory muscles, no crackles or  wheezing on normal respiration, no wheeze on forced expiration  Cardiovascular: RRR, heart sounds normal, no murmur or gallops, no peripheral edema  Musculoskeletal: No deformities, no cyanosis or clubbing  Neuro: alert, awake, non focal  Skin: Warm, no lesions or rash       Assessment & Plan:  Pulmonary nodule 1 cm or greater in diameter Pulmonary Nodule Suspicious right middle lobe nodule with hypermetabolism on PET scan. Discussed the risks/benefits of surgical resection vs navigational bronchoscopy. Given the patient's significant emphysema, decision was made to pursue navigational bronchoscopy for tissue diagnosis. -Schedule navigational bronchoscopy on January 22, 2023. -Stop aspirin two days prior to the procedure. -Obtain a high-resolution CT scan of the chest prior to the procedure.  COPD with chronic bronchitis (HCC) COPD Stable with minimal coughing. Patient is on Breztri and uses albuterol infrequently. -Continue current management.  Cigarette smoker Tobacco Use Patient reports smoking 5-6 cigarettes per day and has cut down significantly. Currently using gum and lozenges to aid in cessation. -Encourage continued efforts to quit smoking.   Levy Pupa, MD, PhD 12/29/2022, 12:12 PM [facility] [phone] or if no answer before 7:00PM call [phone] For any issues after 7:00PM please call eLink [phone]

## 2022-12-29 NOTE — Patient Instructions (Addendum)
We reviewed your chest imaging today. We will arrange for robotic assisted navigational bronchoscopy to further evaluate a right middle lobe pulmonary nodule.  This will be done under general anesthesia as an outpatient at [facility].  You will need a designated driver and someone to watch you that day after the procedure.  We will try to get this scheduled for 01/22/2023 We will arrange for a repeat CT scan of the chest to be done prior to the procedure date Please continue your Breztri and your inhaled medication as you have been taking Follow with Dr. Delton Coombes or Saralyn Pilar about 1 week after the procedure so we can review

## 2022-12-29 NOTE — Assessment & Plan Note (Signed)
COPD Stable with minimal coughing. Patient is on Breztri and uses albuterol infrequently. -Continue current management.

## 2023-01-01 ENCOUNTER — Telehealth (HOSPITAL_COMMUNITY): Payer: Self-pay | Admitting: Licensed Clinical Social Worker

## 2023-01-01 ENCOUNTER — Other Ambulatory Visit (HOSPITAL_COMMUNITY): Payer: Self-pay

## 2023-01-01 ENCOUNTER — Other Ambulatory Visit: Payer: Self-pay | Admitting: Internal Medicine

## 2023-01-01 NOTE — Telephone Encounter (Signed)
I called the patient to discuss. No answer, left a VM. Will try him back

## 2023-01-01 NOTE — Telephone Encounter (Signed)
CSW received message from wife and returned call. Wife thanked CSW for assistance with nicotine patches and "just listening". She shared that patient is now scheduled for the biopsy although not for a few weeks but she is grateful. She inquired about Ensure and CSW suggested she reach out to PCP or surgeon's office for recommendations. If MD approval, informed her to explore [employer] website for some patient assistance programs or try [employer] brand as often less expensive. Wife verbalizes understanding and will follow up. CSW available as needed. Lasandra Beech, LCSW, CCSW-MCS [phone]

## 2023-01-02 ENCOUNTER — Encounter (HOSPITAL_COMMUNITY): Payer: Self-pay

## 2023-01-02 ENCOUNTER — Other Ambulatory Visit: Payer: Self-pay | Admitting: Internal Medicine

## 2023-01-02 ENCOUNTER — Other Ambulatory Visit (HOSPITAL_COMMUNITY): Payer: Self-pay

## 2023-01-02 MED ORDER — PREGABALIN 50 MG PO CAPS
50.0000 mg | ORAL_CAPSULE | Freq: Two times a day (BID) | ORAL | 0 refills | Status: DC | PRN
  Filled 2023-01-02: qty 180, 90d supply, fill #0

## 2023-01-02 NOTE — Telephone Encounter (Signed)
Call patient.  No answer, left a voicemail. It would be possible for [place] to schedule the patient for his procedure sooner with Dr. Tonia Brooms if that is what he wants to do.  I told him on his voicemail that this is a possibility, asked him to call us back.

## 2023-01-03 NOTE — Telephone Encounter (Signed)
Patient states that he is okay with scheduled biopsy with Dr.Byrum on November 25th.

## 2023-01-03 NOTE — Telephone Encounter (Signed)
PT's wife called and wants to have only Dr. Delton Coombes do the surgery. Complained about process of getting this sched. States it has been "disjointed, disconcerting"  and that her husband is losing wt and she is very concerned.Please call to advise @ [phone] (She would like a call back and she will tap the husband in.)

## 2023-01-03 NOTE — Telephone Encounter (Signed)
Thank you agree with keeping the bronchoscopy on 11/25 as planned

## 2023-01-03 NOTE — Telephone Encounter (Signed)
Called and spoke to patient's spouse, Tammy(DPR). Tammy is concerned about wait time for bronch, however she stated that she spoke with her husband prior to our conversation and he would like to keep scheduled date/time for procedure.   Tammy is also requesting update on FMLA. Darilyn, please advise. Thanks   Routing to Dr. Delton Coombes as an Lorain Childes.

## 2023-01-08 ENCOUNTER — Ambulatory Visit (HOSPITAL_COMMUNITY)
Admission: RE | Admit: 2023-01-08 | Discharge: 2023-01-08 | Disposition: A | Discharge status: Home / Self Care | Payer: Commercial Managed Care - PPO | Source: Ambulatory Visit | Attending: Emergency Medicine | Admitting: Emergency Medicine

## 2023-01-08 DIAGNOSIS — R911 Solitary pulmonary nodule: Secondary | ICD-10-CM | POA: Insufficient documentation

## 2023-01-08 DIAGNOSIS — J432 Centrilobular emphysema: Secondary | ICD-10-CM | POA: Diagnosis not present

## 2023-01-08 DIAGNOSIS — I700 Atherosclerosis of aorta: Secondary | ICD-10-CM | POA: Diagnosis not present

## 2023-01-11 ENCOUNTER — Telehealth: Payer: Self-pay | Admitting: Emergency Medicine

## 2023-01-11 NOTE — Telephone Encounter (Signed)
FMLA paperwork for wife Peter Sims (she is a Producer, television/film/video).  I left a voice message for wife asking what she needs for the start date to be for the FMLA and if patient is retired, or working.  Completed the form using the start date 11/06/22 (appt with Dr. Vassie Loll) and stated treatment plan would be determined following 01/22/23 bronchoscopy.  Per Mrs. Poitras's request in a voice message she left me, I set time needed as 6-8 hours per episode and 1-2 times per month.  Send form to Dr. Delton Coombes for review and addition of symptom information and signature.

## 2023-01-12 NOTE — Telephone Encounter (Signed)
Dr. Delton Coombes signed the FMLA form and I faxed it to [employer] at fax# [phone].   I also emailed a copy to the patient at [email or web address].

## 2023-01-15 NOTE — Telephone Encounter (Signed)
Patient's wife, Jerit Tacker, called me to say the leave end date on the form was wrong.  I put 11/27/2022 and it should have been 11/27/2023.  I had Dr. Isaiah Serge initial the change and I have faxed it again to [employer] - attn: Beckie Salts  fax# [phone].

## 2023-01-18 ENCOUNTER — Encounter (HOSPITAL_COMMUNITY): Payer: Self-pay | Admitting: Emergency Medicine

## 2023-01-18 ENCOUNTER — Other Ambulatory Visit: Payer: Self-pay

## 2023-01-18 NOTE — Progress Notes (Addendum)
Mr. Dam denies chest pain or shortness of breath.  Patient denies having any s/s of Covid in his household, also denies any known exposure to Covid. Mr. Lipschutz denies  any s/s of upper or lower respiratory infection in the past 8 weeks.   Mr. Debroah Loop Dr.'s: PCP - Dr. Gwenette Greet Cardiologist - Dr. Chilton Si Pulmonologist- Dr. Vassie Loll  Mr. Spaziano states he has chest pain sometimes, pain comes from back.  Patient denies any other symptoms, shortness of breath, syncope, N/V diaphoresis. Mr. Hake states that it has been over 2 months since he had chest pain, patient takes 1 -2 NTG and the pain subsides.

## 2023-01-19 NOTE — Anesthesia Preprocedure Evaluation (Signed)
Anesthesia Evaluation  Patient identified by MRN, date of birth, ID band Patient awake    Reviewed: Allergy & Precautions, H&P , NPO status , Patient's Chart, lab work & pertinent test results  Airway Mallampati: II  TM Distance: >3 FB Neck ROM: Full    Dental  (+) Poor Dentition, Edentulous Upper,    Pulmonary COPD, Current Smoker and Patient abstained from smoking. Right middle lobe nodule   Pulmonary exam normal breath sounds clear to auscultation       Cardiovascular hypertension, + CAD, + Past MI and + Cardiac Stents  Normal cardiovascular exam Rhythm:Regular Rate:Normal     Neuro/Psych  Headaches  Neuromuscular disease  negative psych ROS   GI/Hepatic Neg liver ROS,GERD  Medicated,,  Endo/Other  negative endocrine ROS    Renal/GU Renal diseasenegative Renal ROS  negative genitourinary   Musculoskeletal negative musculoskeletal ROS (+) Arthritis ,    Abdominal   Peds  Hematology negative hematology ROS (+)   Anesthesia Other Findings   Reproductive/Obstetrics negative OB ROS                             Anesthesia Physical Anesthesia Plan  ASA: 3  Anesthesia Plan: General   Post-op Pain Management:    Induction: Intravenous  PONV Risk Score and Plan: 2 and Ondansetron and Dexamethasone  Airway Management Planned: Oral ETT  Additional Equipment:   Intra-op Plan:   Post-operative Plan: Extubation in OR  Informed Consent: I have reviewed the patients History and Physical, chart, labs and discussed the procedure including the risks, benefits and alternatives for the proposed anesthesia with the patient or authorized representative who has indicated his/her understanding and acceptance.     Dental advisory given  Plan Discussed with: CRNA  Anesthesia Plan Comments: (PAT note by Antionette Poles, PA-C:  72 year old male follows with cardiology for history of CAD s/p anterior  STEMI treated with PCI of mid LAD (otherwise mild to moderate disease managed medically), hypertension, hyperlipidemia. Repeat ETT 04/2020 for chest pain unremarkable. Repeat LHC 11/2020 with patent LAD stent and otherwise nonobstructive disease unchanged from prior.  Last seen by Gillian Shields, NP on 03/09/2022 and noted occasional episodes of back pain similar to his prior anginal equivalent.  Exercise Myoview was ordered and done on 03/14/2022; study was low risk, EF 57%.  Current smoker with associated COPD.  Follow-up with pulmonologist Dr. Vassie Loll. LDCT 09/2022 showed 6.3 by 1.7 cm pleural based anterior right lesion, stable on prior scans, and an irregular 16mm speculated posterior right middle lobe nodule abutting the major fissure, raising suspicion for primary lung cancer. A subsequent PET scan confirmed a hypermetabolic 2.4 by 1.2 cm irregular right middle lobe nodule, with no hypermetabolic thoracic lymphadenopathy.  PFTs 11/21/2022 showed FVC 71%, FEV1 66%, DLCO 62%.  He was seen by Dr. Cliffton Asters on 12/15/2022 and noted to have decent PFTs but severely emphysematous lungs.  Patient hesitant to proceed with surgery; he was recommended to obtain biopsy with pulmonology.  History of GERD, maintained on famotidine and pantoprazole.  Patient will need day of surgery labs and evaluation.  EKG 08/03/2022: Sinus with sinus arrhythmia, rate 78.  CT super D chest 01/08/2023: IMPRESSION: 1. Spiculated right middle lobe nodule, hypermetabolic on recent PET and compatible with primary bronchogenic carcinoma. 2. Extrapleural/pleural soft tissue lesions in the upper hemithoraces, not hypermetabolic on PET and considered benign. 3. Aortic atherosclerosis (ICD10-I70.0). Coronary artery calcification. 4.  Emphysema (ICD10-J43.9).   Exercise  Myoview 03/14/2022:   The study is normal. The study is low risk.   No ST deviation was noted.   Left ventricular function is normal. Nuclear stress EF: 57 %. The  left ventricular ejection fraction is normal (55-65%). End diastolic cavity size is normal.   Prior study not available for comparison.  Cath 12/13/2020:    Prox LAD lesion is 40% stenosed.   Prox Cx lesion is 40% stenosed.   Ost RCA to Prox RCA lesion is 30% stenosed.   2nd Mrg lesion is 50% stenosed.   Mid RCA lesion is 30% stenosed.   Non-stenotic Mid LAD lesion was previously treated.  Mild non-obstructive CAD with continued patency of the LAD stent Normal LVEDP  Suspect non-cardiac chest pain   )        Anesthesia Quick Evaluation

## 2023-01-19 NOTE — Progress Notes (Signed)
Anesthesia Chart Review: Same day workup  72 year old male follows with cardiology for history of CAD s/p anterior STEMI treated with PCI of mid LAD (otherwise mild to moderate disease managed medically), hypertension, hyperlipidemia. Repeat ETT 04/2020 for chest pain unremarkable. Repeat LHC 11/2020 with patent LAD stent and otherwise nonobstructive disease unchanged from prior.  Last seen by Gillian Shields, NP on 03/09/2022 and noted occasional episodes of back pain similar to his prior anginal equivalent.  Exercise Myoview was ordered and done on 03/14/2022; study was low risk, EF 57%.  Current smoker with associated COPD.  Follow-up with pulmonologist Dr. Vassie Loll. LDCT 09/2022 showed 6.3 by 1.7 cm pleural based anterior right lesion, stable on prior scans, and an irregular 16mm speculated posterior right middle lobe nodule abutting the major fissure, raising suspicion for primary lung cancer. A subsequent PET scan confirmed a hypermetabolic 2.4 by 1.2 cm irregular right middle lobe nodule, with no hypermetabolic thoracic lymphadenopathy.  PFTs 11/21/2022 showed FVC 71%, FEV1 66%, DLCO 62%.  He was seen by Dr. Cliffton Asters on 12/15/2022 and noted to have decent PFTs but severely emphysematous lungs.  Patient hesitant to proceed with surgery; he was recommended to obtain biopsy with pulmonology.  History of GERD, maintained on famotidine and pantoprazole.  Patient will need day of surgery labs and evaluation.  EKG 08/03/2022: Sinus with sinus arrhythmia, rate 78.  CT super D chest 01/08/2023: IMPRESSION: 1. Spiculated right middle lobe nodule, hypermetabolic on recent PET and compatible with primary bronchogenic carcinoma. 2. Extrapleural/pleural soft tissue lesions in the upper hemithoraces, not hypermetabolic on PET and considered benign. 3. Aortic atherosclerosis (ICD10-I70.0). Coronary artery calcification. 4.  Emphysema (ICD10-J43.9).    Exercise Myoview 03/14/2022:   The study is normal. The study  is low risk.   No ST deviation was noted.   Left ventricular function is normal. Nuclear stress EF: 57 %. The left ventricular ejection fraction is normal (55-65%). End diastolic cavity size is normal.   Prior study not available for comparison.  Cath 12/13/2020:    Prox LAD lesion is 40% stenosed.   Prox Cx lesion is 40% stenosed.   Ost RCA to Prox RCA lesion is 30% stenosed.   2nd Mrg lesion is 50% stenosed.   Mid RCA lesion is 30% stenosed.   Non-stenotic Mid LAD lesion was previously treated.   Mild non-obstructive CAD with continued patency of the LAD stent Normal LVEDP   Suspect non-cardiac chest pain     Zannie Cove [facility] Short Stay Center/Anesthesiology Phone [phone] 01/19/2023 12:05 PM

## 2023-01-22 ENCOUNTER — Ambulatory Visit (HOSPITAL_COMMUNITY): Payer: Self-pay | Admitting: Physician Assistant

## 2023-01-22 ENCOUNTER — Ambulatory Visit (HOSPITAL_COMMUNITY): Payer: Commercial Managed Care - PPO

## 2023-01-22 ENCOUNTER — Other Ambulatory Visit: Payer: Self-pay

## 2023-01-22 ENCOUNTER — Ambulatory Visit (HOSPITAL_COMMUNITY)
Admission: RE | Admit: 2023-01-22 | Discharge: 2023-01-22 | Disposition: A | Discharge status: Home / Self Care | Payer: Commercial Managed Care - PPO | Attending: Emergency Medicine | Admitting: Emergency Medicine

## 2023-01-22 ENCOUNTER — Ambulatory Visit (HOSPITAL_BASED_OUTPATIENT_CLINIC_OR_DEPARTMENT_OTHER): Payer: Commercial Managed Care - PPO | Admitting: Physician Assistant

## 2023-01-22 ENCOUNTER — Encounter (HOSPITAL_COMMUNITY)
Admission: RE | Disposition: A | Discharge status: Home / Self Care | Payer: Self-pay | Source: Home / Self Care | Attending: Emergency Medicine

## 2023-01-22 ENCOUNTER — Encounter (HOSPITAL_COMMUNITY): Payer: Self-pay | Admitting: Emergency Medicine

## 2023-01-22 DIAGNOSIS — Z79899 Other long term (current) drug therapy: Secondary | ICD-10-CM | POA: Diagnosis not present

## 2023-01-22 DIAGNOSIS — I2510 Atherosclerotic heart disease of native coronary artery without angina pectoris: Secondary | ICD-10-CM | POA: Diagnosis not present

## 2023-01-22 DIAGNOSIS — E785 Hyperlipidemia, unspecified: Secondary | ICD-10-CM | POA: Insufficient documentation

## 2023-01-22 DIAGNOSIS — I252 Old myocardial infarction: Secondary | ICD-10-CM | POA: Diagnosis not present

## 2023-01-22 DIAGNOSIS — F17210 Nicotine dependence, cigarettes, uncomplicated: Secondary | ICD-10-CM | POA: Diagnosis not present

## 2023-01-22 DIAGNOSIS — R918 Other nonspecific abnormal finding of lung field: Secondary | ICD-10-CM | POA: Diagnosis not present

## 2023-01-22 DIAGNOSIS — K219 Gastro-esophageal reflux disease without esophagitis: Secondary | ICD-10-CM | POA: Diagnosis not present

## 2023-01-22 DIAGNOSIS — R911 Solitary pulmonary nodule: Secondary | ICD-10-CM

## 2023-01-22 DIAGNOSIS — Z48813 Encounter for surgical aftercare following surgery on the respiratory system: Secondary | ICD-10-CM | POA: Diagnosis not present

## 2023-01-22 DIAGNOSIS — Z955 Presence of coronary angioplasty implant and graft: Secondary | ICD-10-CM | POA: Insufficient documentation

## 2023-01-22 DIAGNOSIS — C342 Malignant neoplasm of middle lobe, bronchus or lung: Secondary | ICD-10-CM | POA: Insufficient documentation

## 2023-01-22 DIAGNOSIS — J449 Chronic obstructive pulmonary disease, unspecified: Secondary | ICD-10-CM | POA: Insufficient documentation

## 2023-01-22 DIAGNOSIS — I10 Essential (primary) hypertension: Secondary | ICD-10-CM | POA: Diagnosis not present

## 2023-01-22 DIAGNOSIS — M1990 Unspecified osteoarthritis, unspecified site: Secondary | ICD-10-CM | POA: Diagnosis not present

## 2023-01-22 HISTORY — PX: BRONCHIAL BIOPSY: SHX5109

## 2023-01-22 HISTORY — PX: BRONCHIAL NEEDLE ASPIRATION BIOPSY: SHX5106

## 2023-01-22 HISTORY — PX: FIDUCIAL MARKER PLACEMENT: SHX6858

## 2023-01-22 HISTORY — DX: Polyneuropathy, unspecified: G62.9

## 2023-01-22 HISTORY — DX: Myoneural disorder, unspecified: G70.9

## 2023-01-22 HISTORY — PX: BRONCHIAL BRUSHINGS: SHX5108

## 2023-01-22 HISTORY — DX: Acute myocardial infarction, unspecified: I21.9

## 2023-01-22 HISTORY — PX: BRONCHIAL WASHINGS: SHX5105

## 2023-01-22 LAB — BASIC METABOLIC PANEL
Anion gap: 10 (ref 5–15)
BUN: 17 mg/dL (ref 8–23)
CO2: 19 mmol/L — ABNORMAL LOW (ref 22–32)
Calcium: 9.4 mg/dL (ref 8.9–10.3)
Chloride: 107 mmol/L (ref 98–111)
Creatinine, Ser: 0.99 mg/dL (ref 0.61–1.24)
GFR, Estimated: >60 mL/min (ref >60)
Glucose, Bld: 107 mg/dL — ABNORMAL HIGH (ref 70–99)
Potassium: 3.9 mmol/L (ref 3.5–5.1)
Sodium: 136 mmol/L (ref 135–145)

## 2023-01-22 LAB — CBC
HCT: 47.1 % (ref 39.0–52.0)
Hemoglobin: 15.2 g/dL (ref 13.0–17.0)
MCH: 28.7 pg (ref 26.0–34.0)
MCHC: 32.3 g/dL (ref 30.0–36.0)
MCV: 89 fL (ref 80.0–100.0)
Platelets: 204 10*3/uL (ref 150–400)
RBC: 5.29 MIL/uL (ref 4.22–5.81)
RDW: 15.2 % (ref 11.5–15.5)
WBC: 7.7 10*3/uL (ref 4.0–10.5)
nRBC: 0 % (ref 0.0–0.2)

## 2023-01-22 SURGERY — BRONCHOSCOPY, WITH BIOPSY USING ELECTROMAGNETIC NAVIGATION
Anesthesia: General

## 2023-01-22 MED ORDER — OXYCODONE HCL 5 MG PO TABS
ORAL_TABLET | ORAL | Status: AC
  Filled 2023-01-22: qty 1

## 2023-01-22 MED ORDER — ROCURONIUM BROMIDE 10 MG/ML (PF) SYRINGE
PREFILLED_SYRINGE | INTRAVENOUS | Status: DC | PRN
  Administered 2023-01-22: 30 mg via INTRAVENOUS

## 2023-01-22 MED ORDER — DROPERIDOL 2.5 MG/ML IJ SOLN: 0.6250 mg | Freq: Once | INTRAMUSCULAR | Status: DC | PRN

## 2023-01-22 MED ORDER — OXYCODONE HCL 5 MG/5ML PO SOLN: 5.0000 mg | Freq: Once | ORAL | Status: AC | PRN

## 2023-01-22 MED ORDER — ACETAMINOPHEN 10 MG/ML IV SOLN: 1000.0000 mg | Freq: Once | INTRAVENOUS | Status: DC | PRN

## 2023-01-22 MED ORDER — METOPROLOL SUCCINATE 12.5 MG HALF TABLET
12.5000 mg | ORAL_TABLET | Freq: Every day | ORAL | Status: DC
  Filled 2023-01-22: qty 1

## 2023-01-22 MED ORDER — CHLORHEXIDINE GLUCONATE 0.12 % MT SOLN
OROMUCOSAL | Status: AC
  Administered 2023-01-22: 15 mL via OROMUCOSAL
  Filled 2023-01-22: qty 15

## 2023-01-22 MED ORDER — LACTATED RINGERS IV SOLN: INTRAVENOUS | Status: DC

## 2023-01-22 MED ORDER — SUGAMMADEX SODIUM 200 MG/2ML IV SOLN
INTRAVENOUS | Status: DC | PRN
  Administered 2023-01-22: 200 mg via INTRAVENOUS

## 2023-01-22 MED ORDER — CHLORHEXIDINE GLUCONATE 0.12 % MT SOLN: 15.0000 mL | Freq: Once | OROMUCOSAL | Status: AC

## 2023-01-22 MED ORDER — LIDOCAINE 2% (20 MG/ML) 5 ML SYRINGE
INTRAMUSCULAR | Status: DC | PRN
  Administered 2023-01-22: 25 mg via INTRAVENOUS

## 2023-01-22 MED ORDER — PROPOFOL 10 MG/ML IV BOLUS
INTRAVENOUS | Status: DC | PRN
  Administered 2023-01-22: 150 mg via INTRAVENOUS

## 2023-01-22 MED ORDER — FENTANYL CITRATE (PF) 100 MCG/2ML IJ SOLN: 25.0000 ug | INTRAMUSCULAR | Status: DC | PRN

## 2023-01-22 MED ORDER — ONDANSETRON HCL 4 MG/2ML IJ SOLN
INTRAMUSCULAR | Status: DC | PRN
  Administered 2023-01-22: 4 mg via INTRAVENOUS

## 2023-01-22 MED ORDER — IPRATROPIUM-ALBUTEROL 0.5-2.5 (3) MG/3ML IN SOLN
3.0000 mL | RESPIRATORY_TRACT | Status: DC | PRN
Start: 2023-01-22 — End: 2023-02-16

## 2023-01-22 MED ORDER — SUCCINYLCHOLINE CHLORIDE 200 MG/10ML IV SOSY
PREFILLED_SYRINGE | INTRAVENOUS | Status: DC | PRN
  Administered 2023-01-22: 100 mg via INTRAVENOUS

## 2023-01-22 MED ORDER — PHENYLEPHRINE 80 MCG/ML (10ML) SYRINGE FOR IV PUSH (FOR BLOOD PRESSURE SUPPORT)
PREFILLED_SYRINGE | INTRAVENOUS | Status: DC | PRN
  Administered 2023-01-22 (×2): 200 ug via INTRAVENOUS

## 2023-01-22 MED ORDER — DEXAMETHASONE SODIUM PHOSPHATE 10 MG/ML IJ SOLN
INTRAMUSCULAR | Status: DC | PRN
  Administered 2023-01-22: 10 mg via INTRAVENOUS

## 2023-01-22 MED ORDER — OXYCODONE HCL 5 MG PO TABS
5.0000 mg | ORAL_TABLET | Freq: Once | ORAL | Status: AC | PRN
  Administered 2023-01-22: 5 mg via ORAL

## 2023-01-22 MED ORDER — METOPROLOL SUCCINATE ER 25 MG PO TB24
ORAL_TABLET | ORAL | Status: AC
  Administered 2023-01-22: 12.5 mg via ORAL
  Filled 2023-01-22: qty 13

## 2023-01-22 SURGICAL SUPPLY — 1 items: SuperLock Fiducual Marker IMPLANT

## 2023-01-22 NOTE — Research (Signed)
Title: A multi-center, prospective, single-arm, observational study to evaluate real-world outcomes for the shape-sensing Ion endoluminal system  Primary Outcome: Evaluate procedure characteristics and short and long-term patient outcomes following shape-sensing robotic-assisted bronchoscopy (ssRAB) utilizing the Ion Endoluminal System for lung lesion localization or biopsy.   Protocol # / Study Name: ISI-ION-003 Clinical Trials #: [id number] Sponsor: [employer] Principal Investigator: Dr. Elige Radon Icard  Key Features of Ion Endoluminal System (referred to as "Ion") Ion is the first FDA cleared bronchoscopy system that uses fiber optic shape sensing technology to inform on location within the airways. Its catheter/tool channel has a smaller outer diameter (3.5 mm) in comparison to conventional bronchoscopes, allowing it to navigate into the smaller airways of the periphery.     Key Inclusion Criteria Subject is 18 years or older at the time of the procedure Subject is a candidate for a planned, elective RAB lung lesion localization or biopsy procedure in which the Ion Endoluminal System is planned to be utilized.  Subject  able to understand and adhere to study requirements and provide informed consent.   Key Exclusion Criteria Subject is under the care of a Museum/gallery exhibitions officer and is unable to provide informed consent on their own accord.  Subject is participating in an interventional research study or research study with investigational agents with an unknown safety profile that would interfere with participation or the results of this study.  Male subjects who are pregnant or nursing at the time of the index Ion procedure, as determined by standard site practices. Subjects that are incarcerated or institutionalized under court order, or other vulnerable populations.    Previous Clinical Trials Since receiving FDA clearance in Feb 2019, Ion has been adopted  commercially by over 226 centers in the [place], and utilized in over 40,000 procedures.  The first in-human study enrolled 26 subjects with a mean lesion size of 14.8 mm and the overall diagnostic yield was 79.3%, with no adverse events. 17 (58.6%) lesions were reported to have a bronchus sign available on CT imaging.  A multi-center study published results in 2022, with 270 lesions biopsied in 241 patients using Ion. The mean largest cardinal lesion size was 18.86.33mm, and the mean airway generation count was 7.01.6. Asymptomatic pneumothorax occurred in 3.3% of subjects, and 0.8% experienced airway bleeding.   Another study provided preliminary results in 2022, with 87% sensitivity for malignancy, a diagnostic yield of 81%, and a mean lesion size of 16 mm. 75% of biopsy cases were bronchus-sign negative. 4% of subjects experienced pneumothoraces (including those requiring intervention), and 0.8% of subjects experienced airway bleeding requiring wedging or balloon tamponade.  A single-center study captured 131 consecutive procedures of pulmonary biopsy using Ion. The navigational success rate was 98.7%, with an overall diagnostic yield of 81.7%, an overall complication rate of 3%, and a pneumothorax rate of 1.5%.    [facility] [place] Clinical Research Coordinator note:   This visit for Subject Peter Sims with DOB: Feb 23, 1951 on 01/22/2023 for the above protocol is Visit/Encounter # Pre-procedure, Intra-procedure and Post-procedure, and is for purpose of research.   The consent for this encounter is under:  Protocol Version 1.0 Investigator Brochure Version N/A Consent Version Revision A, dated 14Nov2023 and is currently IRB approved.   Peter Sims expressed continued interest and consent in continuing as a study subject. Subject confirmed that there was no change in contact information (e.g. address, telephone, email). Subject thanked for participation in research and  contribution to science. In  this visit 01/22/2023 the subject will be evaluated by Sub-Investigator named Dr. Delton Coombes. This research coordinator has verified that the above investigator is up to date with his/her training logs.   The Subject was informed that the PI continues to have oversight of the subject's visits and course through relevant discussions, reviews, and also specifically of this visit by routing of this note to the PI.  The research study was discussed with the subject in the pre-operative room. The study was explained in detail including all the contents of the informed consent document. The subject was encouraged to ask questions. All questions were answered to their satisfaction. The IRB approved informed consent was signed, and a copy was given to the subject. After obtaining consent, the subject underwent scheduled procedure using the ion endoluminal system. Data collection was completed per protocol. Refer to paper source subject binder for further details.      Signed by  Verdene Lennert Clinical Research Coordinator / Sub-Investigator  [facility]  [place], [place] 8:45 AM 01/22/2023

## 2023-01-22 NOTE — Transfer of Care (Signed)
Immediate Anesthesia Transfer of Care Note  Patient: IKEEM SCHOENIG  Procedure(s) Performed: ROBOTIC ASSISTED NAVIGATIONAL BRONCHOSCOPY BRONCHIAL NEEDLE ASPIRATION BIOPSIES BRONCHIAL BRUSHINGS BRONCHIAL BIOPSIES FIDUCIAL MARKER PLACEMENT BRONCHIAL WASHINGS  Patient Location: PACU  Anesthesia Type:General  Level of Consciousness: awake, alert , and oriented  Airway & Oxygen Therapy: Patient Spontanous Breathing  Post-op Assessment: Report given to RN and Post -op Vital signs reviewed and stable  Post vital signs: Reviewed and stable  Last Vitals:  Vitals Value Taken Time  BP 145/86 01/22/23 0849  Temp    Pulse 85 01/22/23 0851  Resp 34 01/22/23 0851  SpO2 98 % 01/22/23 0851  Vitals shown include unfiled device data.  Last Pain:  Vitals:   01/22/23 0622  TempSrc:   PainSc: 0-No pain         Complications: There were no known notable events for this encounter.

## 2023-01-22 NOTE — Interval H&P Note (Signed)
History and Physical Interval Note:  01/22/2023 7:21 AM  Peter Sims  has presented today for surgery, with the diagnosis of RIGHT MIDDLE LOPE NODULE.  The various methods of treatment have been discussed with the patient and family. After consideration of risks, benefits and other options for treatment, the patient has consented to  Procedure(s): ROBOTIC ASSISTED NAVIGATIONAL BRONCHOSCOPY (N/A) as a surgical intervention.  The patient's history has been reviewed, patient examined, no change in status, stable for surgery.  I have reviewed the patient's chart and labs.  Questions were answered to the patient's satisfaction.     Leslye Peer

## 2023-01-22 NOTE — Discharge Instructions (Signed)
Flexible Bronchoscopy, Care After This sheet gives you information about how to care for yourself after your test. Your doctor may also give you more specific instructions. If you have problems or questions, contact your doctor. Follow these instructions at home: Eating and drinking When your numbness is gone and your cough and gag reflexes have come back, you may: Eat only soft foods. Slowly drink liquids. The day after the test, go back to your normal diet. Driving Do not drive for 24 hours if you were given a medicine to help you relax (sedative). Do not drive or use heavy machinery while taking prescription pain medicine. General instructions  Take over-the-counter and prescription medicines only as told by your doctor. Return to your normal activities as told. Ask what activities are safe for you. Do not use any products that have nicotine or tobacco in them. This includes cigarettes and e-cigarettes. If you need help quitting, ask your doctor. Keep all follow-up visits as told by your doctor. This is important. It is very important if you had a tissue sample (biopsy) taken. Get help right away if: You have shortness of breath that gets worse. You get light-headed. You feel like you are going to pass out (faint). You have chest pain. You cough up: More than a little blood. More blood than before. Summary Do not eat or drink anything (not even water) for 2 hours after your test, or until your numbing medicine wears off. Do not use cigarettes. Do not use e-cigarettes. Get help right away if you have chest pain.  Please call our office for any questions or concerns.  [phone]. Okay to restart your aspirin on 01/23/2023  This information is not intended to replace advice given to you by your health care provider. Make sure you discuss any questions you have with your health care provider. Document Released: 12/11/2008 Document Revised: 01/26/2017 Document Reviewed:  03/03/2016 Elsevier Patient Education  2020 [employer].

## 2023-01-22 NOTE — Anesthesia Procedure Notes (Signed)
Procedure Name: Intubation Date/Time: 01/22/2023 7:46 AM  Performed by: Hali Marry, CRNAPre-anesthesia Checklist: Patient identified, Emergency Drugs available, Suction available and Patient being monitored Patient Re-evaluated:Patient Re-evaluated prior to induction Oxygen Delivery Method: Circle system utilized Preoxygenation: Pre-oxygenation with 100% oxygen Induction Type: IV induction Ventilation: Mask ventilation without difficulty Laryngoscope Size: Mac and 4 Grade View: Grade I Tube type: Oral Number of attempts: 1 Airway Equipment and Method: Stylet and Oral airway Placement Confirmation: ETT inserted through vocal cords under direct vision, positive ETCO2 and breath sounds checked- equal and bilateral Secured at: 22 cm Tube secured with: Tape Dental Injury: Teeth and Oropharynx as per pre-operative assessment

## 2023-01-22 NOTE — Op Note (Signed)
Video Bronchoscopy with Robotic Assisted Bronchoscopic Navigation   Date of Operation: 01/22/2023   Pre-op Diagnosis: Right middle lobe pulmonary nodule  Post-op Diagnosis: Same  Surgeon: Levy Pupa  Assistants: None  Anesthesia: General endotracheal anesthesia  Operation: Flexible video fiberoptic bronchoscopy with robotic assistance and biopsies.  Estimated Blood Loss: Minimal  Complications: None  Indications and History: Peter Sims is a 72 y.o. male with history of tobacco use.  He has a spiculated right middle lobe pulmonary nodule on CT scan of the chest, mild hypermetabolism on associated PET scan.  Recommendation made to achieve a tissue diagnosis via robotic assisted navigational bronchoscopy. The risks, benefits, complications, treatment options and expected outcomes were discussed with the patient.  The possibilities of pneumothorax, pneumonia, reaction to medication, pulmonary aspiration, perforation of a viscus, bleeding, failure to diagnose a condition and creating a complication requiring transfusion or operation were discussed with the patient who freely signed the consent.    Description of Procedure: The patient was seen in the Preoperative Area, was examined and was deemed appropriate to proceed.  The patient was taken to [facility] endoscopy room 3, identified as Peter Sims and the procedure verified as Flexible Video Fiberoptic Bronchoscopy.  A Time Out was held and the above information confirmed.   Prior to the date of the procedure a high-resolution CT scan of the chest was performed. Utilizing ION software program a virtual tracheobronchial tree was generated to allow the creation of distinct navigation pathways to the patient's parenchymal abnormalities. After being taken to the operating room general anesthesia was initiated and the patient  was orally intubated. The video fiberoptic bronchoscope was introduced via the endotracheal tube and a general  inspection was performed which showed normal right and left lung anatomy. Aspiration of the bilateral mainstems was completed to remove any remaining secretions. Robotic catheter inserted into patient's endotracheal tube.   Target #1 right middle lobe pulmonary nodule: The distinct navigation pathways prepared prior to this procedure were then utilized to navigate to patient's lesion identified on CT scan. The robotic catheter was secured into place and the vision probe was withdrawn.  Lesion location was approximated using fluoroscopy.  Local registration and targeting was performed using Cios three-dimensional imaging.  Under fluoroscopic guidance transbronchial needle brushings, transbronchial needle biopsies, and transbronchial forceps biopsies were performed to be sent for cytology and pathology.  Needle in lesion was established using Cios-spin.  Under fluoroscopic guidance a single fiducial marker was placed adjacent to the nodule.  A bronchioalveolar lavage was performed in the right middle lobe adjacent to the nodule and sent for cytology.   At the end of the procedure a general airway inspection was performed and there was no evidence of active bleeding. The bronchoscope was removed.  The patient tolerated the procedure well. There was no significant blood loss and there were no obvious complications. A post-procedural chest x-ray is pending.  Samples Target #1: 1. Transbronchial needle brushings from right middle lobe pulmonary nodule 2. Transbronchial Wang needle biopsies from right middle lobe pulmonary nodule 3. Transbronchial forceps biopsies from right middle lobe pulmonary nodule 4. Bronchoalveolar lavage from right middle lobe   Plans:  The patient will be discharged from the PACU to home when recovered from anesthesia and after chest x-ray is reviewed. We will review the cytology, pathology and microbiology results with the patient when they become available. Outpatient followup  will be with Dr. Delton Coombes and Saralyn Pilar, NP.    Levy Pupa, MD, PhD 01/22/2023,  8:43 AM [facility] [phone] or if no answer before 7:00PM call [phone] For any issues after 7:00PM please call eLink [phone]

## 2023-01-22 NOTE — Anesthesia Postprocedure Evaluation (Signed)
Anesthesia Post Note  Patient: Peter Sims  Procedure(s) Performed: ROBOTIC ASSISTED NAVIGATIONAL BRONCHOSCOPY BRONCHIAL NEEDLE ASPIRATION BIOPSIES BRONCHIAL BRUSHINGS BRONCHIAL BIOPSIES FIDUCIAL MARKER PLACEMENT BRONCHIAL WASHINGS     Patient location during evaluation: PACU Anesthesia Type: General Level of consciousness: awake and alert Pain management: pain level controlled Vital Signs Assessment: post-procedure vital signs reviewed and stable Respiratory status: spontaneous breathing, nonlabored ventilation, respiratory function stable and patient connected to nasal cannula oxygen Cardiovascular status: blood pressure returned to baseline and stable Postop Assessment: no apparent nausea or vomiting Anesthetic complications: no   There were no known notable events for this encounter.  Last Vitals:  Vitals:   01/22/23 0915 01/22/23 0920  BP: (!) 146/100 (!) 137/95  Pulse: 87 83  Resp:  19  Temp:  (!) 36.3 C  SpO2: 97% 98%    Last Pain:  Vitals:   01/22/23 0930  TempSrc:   PainSc: 4                  [place]

## 2023-01-23 LAB — CYTOLOGY - NON PAP

## 2023-01-23 NOTE — Telephone Encounter (Signed)
Lm x1 for patient's wife, Tammy(DPR).

## 2023-01-23 NOTE — Telephone Encounter (Signed)
Needs to be evaluated in ED, this is not typical presentation to have after a bronchoscopy and sounds like an acute change

## 2023-01-23 NOTE — Telephone Encounter (Signed)
Pt is calling severe body aches and he is not able to get out of the bed. Please call wife directly at [phone]

## 2023-01-23 NOTE — Telephone Encounter (Signed)
Patient's wife calling in regards to the bronch that was done yesterday. Patient's wife states patient is having full body aches and is having a hard time walking due to them. Please call back at [phone]

## 2023-01-23 NOTE — Telephone Encounter (Signed)
Glenford Bayley, NP  to [facility] Triage Pool      01/23/23 12:23 PM Note Needs to be evaluated in ED, this is not typical presentation to have after a bronchoscopy and sounds like an acute change      Spoke with the pt and notified of response per [facility]. He verbalized understanding.

## 2023-01-23 NOTE — Telephone Encounter (Signed)
Pt returning missed call.

## 2023-01-23 NOTE — Telephone Encounter (Signed)
Called and spoke with patient's wife, Peter Sims (DPR), she states he had a Bronch yesterday (by Dr. Delton Coombes) and was told to expect some body aches, but he is having extreme body aches and it has gotten worse over night.  He is unable to move without assistance (wife has to lift legs and assist to the bathroom) and his whole body hurts.  Denies any fever or chills.  Reports that it hurts to cough.  Denies coughing up any blood.  Can barely get up to use the bathroom, requires assistance.   He is able to eat and drink.  He has been taking Tylenol as often as possible for body aches and it is not helping.  Advised I would get a message to the provider of the day and then one of [place] would give them a call back with recommendations.  She verbalized understanding.  Beth, please advise.  Thank you.

## 2023-01-24 ENCOUNTER — Emergency Department (HOSPITAL_COMMUNITY): Payer: Commercial Managed Care - PPO

## 2023-01-24 ENCOUNTER — Other Ambulatory Visit: Payer: Self-pay

## 2023-01-24 ENCOUNTER — Encounter (HOSPITAL_COMMUNITY): Payer: Self-pay | Admitting: Emergency Medicine

## 2023-01-24 ENCOUNTER — Emergency Department (HOSPITAL_COMMUNITY)
Admission: EM | Admit: 2023-01-24 | Discharge: 2023-01-24 | Disposition: A | Discharge status: Home / Self Care | Payer: Commercial Managed Care - PPO | Attending: Emergency Medicine | Admitting: Emergency Medicine

## 2023-01-24 ENCOUNTER — Other Ambulatory Visit (HOSPITAL_COMMUNITY): Payer: Self-pay

## 2023-01-24 DIAGNOSIS — R079 Chest pain, unspecified: Secondary | ICD-10-CM | POA: Diagnosis not present

## 2023-01-24 DIAGNOSIS — R109 Unspecified abdominal pain: Secondary | ICD-10-CM | POA: Diagnosis not present

## 2023-01-24 DIAGNOSIS — M47816 Spondylosis without myelopathy or radiculopathy, lumbar region: Secondary | ICD-10-CM | POA: Diagnosis not present

## 2023-01-24 DIAGNOSIS — T889XXA Complication of surgical and medical care, unspecified, initial encounter: Secondary | ICD-10-CM | POA: Insufficient documentation

## 2023-01-24 DIAGNOSIS — J439 Emphysema, unspecified: Secondary | ICD-10-CM | POA: Diagnosis not present

## 2023-01-24 DIAGNOSIS — M25551 Pain in right hip: Secondary | ICD-10-CM | POA: Diagnosis not present

## 2023-01-24 DIAGNOSIS — M47812 Spondylosis without myelopathy or radiculopathy, cervical region: Secondary | ICD-10-CM | POA: Diagnosis not present

## 2023-01-24 DIAGNOSIS — M4803 Spinal stenosis, cervicothoracic region: Secondary | ICD-10-CM | POA: Diagnosis not present

## 2023-01-24 DIAGNOSIS — Z1152 Encounter for screening for COVID-19: Secondary | ICD-10-CM | POA: Diagnosis not present

## 2023-01-24 DIAGNOSIS — M438X6 Other specified deforming dorsopathies, lumbar region: Secondary | ICD-10-CM | POA: Diagnosis not present

## 2023-01-24 DIAGNOSIS — M1612 Unilateral primary osteoarthritis, left hip: Secondary | ICD-10-CM | POA: Diagnosis not present

## 2023-01-24 DIAGNOSIS — M79609 Pain in unspecified limb: Secondary | ICD-10-CM | POA: Diagnosis not present

## 2023-01-24 DIAGNOSIS — M1611 Unilateral primary osteoarthritis, right hip: Secondary | ICD-10-CM | POA: Diagnosis not present

## 2023-01-24 DIAGNOSIS — M25552 Pain in left hip: Secondary | ICD-10-CM | POA: Diagnosis not present

## 2023-01-24 DIAGNOSIS — K5730 Diverticulosis of large intestine without perforation or abscess without bleeding: Secondary | ICD-10-CM | POA: Diagnosis not present

## 2023-01-24 DIAGNOSIS — R531 Weakness: Secondary | ICD-10-CM | POA: Diagnosis not present

## 2023-01-24 DIAGNOSIS — Z7982 Long term (current) use of aspirin: Secondary | ICD-10-CM | POA: Diagnosis not present

## 2023-01-24 DIAGNOSIS — Z79899 Other long term (current) drug therapy: Secondary | ICD-10-CM | POA: Diagnosis not present

## 2023-01-24 DIAGNOSIS — M4800 Spinal stenosis, site unspecified: Secondary | ICD-10-CM | POA: Diagnosis not present

## 2023-01-24 DIAGNOSIS — R059 Cough, unspecified: Secondary | ICD-10-CM | POA: Diagnosis not present

## 2023-01-24 DIAGNOSIS — R262 Difficulty in walking, not elsewhere classified: Secondary | ICD-10-CM | POA: Diagnosis not present

## 2023-01-24 DIAGNOSIS — I700 Atherosclerosis of aorta: Secondary | ICD-10-CM | POA: Diagnosis not present

## 2023-01-24 DIAGNOSIS — C3491 Malignant neoplasm of unspecified part of right bronchus or lung: Secondary | ICD-10-CM | POA: Diagnosis not present

## 2023-01-24 DIAGNOSIS — M545 Low back pain, unspecified: Secondary | ICD-10-CM | POA: Diagnosis not present

## 2023-01-24 DIAGNOSIS — M4802 Spinal stenosis, cervical region: Secondary | ICD-10-CM | POA: Diagnosis not present

## 2023-01-24 DIAGNOSIS — M48061 Spinal stenosis, lumbar region without neurogenic claudication: Secondary | ICD-10-CM | POA: Diagnosis not present

## 2023-01-24 DIAGNOSIS — I10 Essential (primary) hypertension: Secondary | ICD-10-CM | POA: Diagnosis not present

## 2023-01-24 LAB — CBC
HCT: 48.7 % (ref 39.0–52.0)
Hemoglobin: 16 g/dL (ref 13.0–17.0)
MCH: 28.9 pg (ref 26.0–34.0)
MCHC: 32.9 g/dL (ref 30.0–36.0)
MCV: 88.1 fL (ref 80.0–100.0)
Platelets: 212 10*3/uL (ref 150–400)
RBC: 5.53 MIL/uL (ref 4.22–5.81)
RDW: 15 % (ref 11.5–15.5)
WBC: 7.2 10*3/uL (ref 4.0–10.5)
nRBC: 0 % (ref 0.0–0.2)

## 2023-01-24 LAB — URINALYSIS, ROUTINE W REFLEX MICROSCOPIC
Bilirubin Urine: NEGATIVE
Glucose, UA: NEGATIVE mg/dL
Hgb urine dipstick: NEGATIVE
Ketones, ur: NEGATIVE mg/dL
Leukocytes,Ua: NEGATIVE
Nitrite: NEGATIVE
Protein, ur: NEGATIVE mg/dL
Specific Gravity, Urine: 1.028 (ref 1.005–1.030)
pH: 5 (ref 5.0–8.0)

## 2023-01-24 LAB — HEPATIC FUNCTION PANEL
ALT: 27 U/L (ref 0–44)
AST: 26 U/L (ref 15–41)
Albumin: 3.6 g/dL (ref 3.5–5.0)
Alkaline Phosphatase: 87 U/L (ref 38–126)
Bilirubin, Direct: 0.2 mg/dL (ref 0.0–0.2)
Indirect Bilirubin: 0.7 mg/dL (ref 0.3–0.9)
Total Bilirubin: 0.9 mg/dL (ref <1.2)
Total Protein: 7.1 g/dL (ref 6.5–8.1)

## 2023-01-24 LAB — TROPONIN I (HIGH SENSITIVITY)
Troponin I (High Sensitivity): 4 ng/L (ref <18)
Troponin I (High Sensitivity): 4 ng/L (ref <18)

## 2023-01-24 LAB — RAPID URINE DRUG SCREEN, HOSP PERFORMED
Amphetamines: NOT DETECTED
Barbiturates: NOT DETECTED
Benzodiazepines: NOT DETECTED
Cocaine: NOT DETECTED
Opiates: NOT DETECTED
Tetrahydrocannabinol: POSITIVE — AB

## 2023-01-24 LAB — BASIC METABOLIC PANEL
Anion gap: 9 (ref 5–15)
BUN: 11 mg/dL (ref 8–23)
CO2: 24 mmol/L (ref 22–32)
Calcium: 9.9 mg/dL (ref 8.9–10.3)
Chloride: 106 mmol/L (ref 98–111)
Creatinine, Ser: 1.03 mg/dL (ref 0.61–1.24)
GFR, Estimated: >60 mL/min (ref >60)
Glucose, Bld: 129 mg/dL — ABNORMAL HIGH (ref 70–99)
Potassium: 4.2 mmol/L (ref 3.5–5.1)
Sodium: 139 mmol/L (ref 135–145)

## 2023-01-24 LAB — CK: Total CK: 299 U/L (ref 49–397)

## 2023-01-24 LAB — RESP PANEL BY RT-PCR (RSV, FLU A&B, COVID)  RVPGX2
Influenza A by PCR: NEGATIVE
Influenza B by PCR: NEGATIVE
Resp Syncytial Virus by PCR: NEGATIVE
SARS Coronavirus 2 by RT PCR: NEGATIVE

## 2023-01-24 MED ORDER — LORAZEPAM 2 MG/ML IJ SOLN
0.5000 mg | Freq: Once | INTRAMUSCULAR | Status: AC
  Administered 2023-01-24: 0.5 mg via INTRAVENOUS
  Filled 2023-01-24: qty 1

## 2023-01-24 MED ORDER — LACTATED RINGERS IV BOLUS
1000.0000 mL | Freq: Once | INTRAVENOUS | Status: AC
  Administered 2023-01-24: 1000 mL via INTRAVENOUS

## 2023-01-24 MED ORDER — GADOBUTROL 1 MMOL/ML IV SOLN
7.0000 mL | Freq: Once | INTRAVENOUS | Status: AC | PRN
  Administered 2023-01-24: 7 mL via INTRAVENOUS

## 2023-01-24 MED ORDER — KETOROLAC TROMETHAMINE 15 MG/ML IJ SOLN
15.0000 mg | Freq: Once | INTRAMUSCULAR | Status: AC
  Administered 2023-01-24: 15 mg via INTRAVENOUS
  Filled 2023-01-24: qty 1

## 2023-01-24 MED ORDER — METHYLPREDNISOLONE 4 MG PO TBPK
ORAL_TABLET | ORAL | 0 refills | Status: AC
  Filled 2023-01-24: qty 21, 6d supply, fill #0

## 2023-01-24 MED ORDER — HYDROMORPHONE HCL 1 MG/ML IJ SOLN
0.5000 mg | Freq: Once | INTRAMUSCULAR | Status: AC
  Administered 2023-01-24: 0.5 mg via INTRAVENOUS
  Filled 2023-01-24: qty 1

## 2023-01-24 MED ORDER — DEXAMETHASONE SODIUM PHOSPHATE 10 MG/ML IJ SOLN
10.0000 mg | Freq: Once | INTRAMUSCULAR | Status: AC
  Administered 2023-01-24: 10 mg via INTRAVENOUS
  Filled 2023-01-24: qty 1

## 2023-01-24 MED ORDER — IOHEXOL 350 MG/ML SOLN
75.0000 mL | Freq: Once | INTRAVENOUS | Status: AC | PRN
  Administered 2023-01-24: 75 mL via INTRAVENOUS

## 2023-01-24 MED ORDER — HYDROCODONE-ACETAMINOPHEN 5-325 MG PO TABS
1.0000 | ORAL_TABLET | Freq: Four times a day (QID) | ORAL | 0 refills | Status: DC | PRN
  Filled 2023-01-24: qty 10, 3d supply, fill #0

## 2023-01-24 NOTE — Discharge Instructions (Addendum)
Your pain and this may be coming from narrowing of your spinal canal.  We are treating you with pain control with hydrocodone as well as steroids (Medrol).  Do not combine the hydrocodone with other medicines, alcohol, and do not drive or operate heavy machinery while on this.  You were given a dose of dexamethasone here and can start the Medrol prescription tomorrow.  Follow-up with Dr. Franky Macho.  If you develop worsening, recurrent, or continued back pain, numbness or weakness in the legs, incontinence of your bowels or bladders, numbness of your buttocks, fever, abdominal pain, or any other new/concerning symptoms then return to the ER for evaluation.

## 2023-01-24 NOTE — ED Notes (Signed)
RN called CCMD about pt going to MRI

## 2023-01-24 NOTE — ED Triage Notes (Addendum)
Pt arrives to ED c/o pain in chest, wrists, shoulders and legs x 2 days. Pt received bronchostomy on Monday and states that symptoms have been present since. Pt reports that walking is difficult and almost impossible because of pain. Pt with cancerous nodule in right lung. Pt endorses SOB and pink sputum, denies N/V at this moment

## 2023-01-24 NOTE — ED Provider Notes (Signed)
[place] [facility]GENC[facility] DE[facility]MENT AT [facility] Provider Note   CSN: [id number] Arrival date & time: 01/24/23  7829     History  Chief Complaint  Patient presents with   Post-op Problem    Peter Sims is a 72 y.o. male.  HPI 72 year old male presents with weakness and extremity pain.  2 days ago he had a bronchoscopy.  When he woke up he was wheeled out to the car and taken home.  After couple hours at home he realized that his legs were weak and very painful.  They feel like logs and are rigid.  He has also been having pain in his wrists, arms but primarily this is in his legs.  He is also having chest and abdominal pain, especially when he coughs.  He has had a little bit of a cough with some pink sputum but this was expected with the biopsy and is not worsening.  He has not had a fever.  He has been taking diclofenac and Tylenol.  He is able to shuffle but cannot lift his legs up and his wife has to help get his legs off the bed.  No change in urination.  He smokes but is smoking less since he found out about the lung mass.  He denies alcohol or drug use.  Has chronic but unchanged back pain.  Has not had any incontinence.  Home Medications Prior to Admission medications   Medication Sig Start Date End Date Taking? Authorizing Provider  acetaminophen (TYLENOL) 325 MG tablet Take 2 tablets (650 mg total) by mouth every 4 (four) hours as needed for headache or mild pain. 01/15/20  Yes Leone Brand, NP  albuterol (PROVENTIL) (2.5 MG/3ML) 0.083% nebulizer solution Take 3 mLs (2.5 mg total) by nebulization every 6 (six) hours as needed for wheezing or shortness of breath. 02/03/22  Yes Oretha Milch, MD  albuterol (VENTOLIN HFA) 108 (90 Base) MCG/ACT inhaler INHALE 2 PUFFS INTO THE LUNGS EVERY 6 (SIX) HOURS AS NEEDED FOR WHEEZING OR SHORTNESS OF BREATH. 05/03/20 01/24/23 Yes Dorothyann Peng, MD  aspirin 81 MG chewable tablet Chew 1 tablet (81 mg total) by mouth daily.  01/16/20  Yes Leone Brand, NP  atorvastatin (LIPITOR) 80 MG tablet Take 1 tablet (80 mg total) by mouth daily. 09/07/22  Yes Dorothyann Peng, MD  Budeson-Glycopyrrol-Formoterol (BREZTRI AEROSPHERE) 160-9-4.8 MCG/ACT AERO Inhale 2 puffs into the lungs 2 (two) times daily. 07/28/22  Yes Dorothyann Peng, MD  diclofenac Sodium (VOLTAREN) 1 % GEL Apply 2 g topically 2 (two) times daily as needed (joint pain). 07/13/21  Yes Dorothyann Peng, MD  famotidine (PEPCID) 40 MG tablet Take 1 tablet (40 mg total) by mouth 2 (two) times daily. 07/28/22  Yes Dorothyann Peng, MD  HYDROcodone-acetaminophen [facility]) 5-325 MG tablet Take 1 tablet by mouth every 6 (six) hours as needed. 01/24/23  Yes Pricilla Loveless, MD  methylPREDNISolone (MEDROL DOSEPAK) 4 MG TBPK tablet Take 6 tablets (24 mg total) by mouth daily for 1 day, THEN 5 tablets (20 mg total) daily for 1 day, THEN 4 tablets (16 mg total) daily for 1 day, THEN 3 tablets (12 mg total) daily for 1 day, THEN 2 tablets (8 mg total) daily for 1 day, THEN 1 tablet (4 mg total) daily for 1 day. 01/24/23 01/30/23 Yes Pricilla Loveless, MD  metoprolol succinate (TOPROL XL) 25 MG 24 hr tablet Take 1/2 tablet (12.5 mg total) by mouth daily. 11/07/22  Yes Dorothyann Peng, MD  montelukast (  SINGULAIR) 10 MG tablet Take 1 tablet (10 mg total) by mouth every evening. Patient taking differently: Take 10 mg by mouth daily as needed (Asthma). 05/27/21  Yes Dorothyann Peng, MD  nicotine polacrilex (NICORETTE) 2 MG lozenge Use one lozenge every 2-3 hours as needed 12/19/22  Yes Dorothyann Peng, MD  nitroGLYCERIN (NITROSTAT) 0.4 MG SL tablet Place 1 tablet (0.4 mg total) under the tongue every 5 (five) minutes as needed for chest pain. 03/06/22  Yes Alver Sorrow, NP  pantoprazole (PROTONIX) 40 MG tablet Take 1 tablet (40 mg total) by mouth daily. STOP OMEPRAZOLE 02/16/22  Yes Dorothyann Peng, MD  pregabalin (LYRICA) 50 MG capsule Take 1 capsule (50 mg total) by mouth 2 (two) times daily as  needed. 01/02/23  Yes Dorothyann Peng, MD  tamsulosin (FLOMAX) 0.4 MG CAPS capsule Take 2 capsules (0.8 mg total) by mouth at bedtime. Pt needs follow up for additional refills Patient taking differently: Take 0.4 mg by mouth at bedtime. 11/07/22  Yes Dorothyann Peng, MD  valsartan (DIOVAN) 160 MG tablet Take 1 tablet (160 mg total) by mouth daily. 01/03/22 01/31/23 Yes Dorothyann Peng, MD  ipratropium-albuterol (DUONEB) 0.5-2.5 (3) MG/3ML SOLN Take 3 mLs by nebulization as needed. Patient not taking: Reported on 01/24/2023 01/22/23   Leslye Peer, MD  nicotine (NICOTROL) 10 MG inhaler Inhale 1 Cartridge (1 continuous puffing total) into the lungs as needed for smoking cessation. Patient not taking: Reported on 01/24/2023 11/06/22   Oretha Milch, MD  Respiratory Therapy Supplies (FLUTTER) DEVI Use as directed 02/02/17   Nyoka Cowden, MD  omeprazole (PRILOSEC) 40 MG capsule TAKE 1 CAPSULE BY MOUTH EVERY DAY BEFORE A MEAL 11/05/19 03/03/20  Dorothyann Peng, MD      Allergies    Penicillins    Review of Systems   Review of Systems  Constitutional:  Negative for fever.  Eyes:  Negative for visual disturbance.  Respiratory:  Positive for cough. Negative for shortness of breath.   Cardiovascular:  Positive for chest pain.  Gastrointestinal:  Negative for diarrhea and vomiting.  Musculoskeletal:  Positive for myalgias.  Neurological:  Positive for weakness. Negative for numbness and headaches.    Physical Exam Updated Vital Signs BP (!) 157/94   Pulse 63   Temp (!) 97 F (36.1 C) (Axillary)   Resp 16   SpO2 99%  Physical Exam Vitals and nursing note reviewed.  Constitutional:      General: He is not in acute distress.    Appearance: He is well-developed. He is not ill-appearing or diaphoretic.  HENT:     Head: Normocephalic and atraumatic.  Cardiovascular:     Rate and Rhythm: Normal rate and regular rhythm.     Pulses:          Dorsalis pedis pulses are 2+ on the right side and 2+ on  the left side.     Heart sounds: Normal heart sounds.  Pulmonary:     Effort: Pulmonary effort is normal.     Breath sounds: Normal breath sounds. No wheezing.  Abdominal:     Palpations: Abdomen is soft.     Tenderness: There is abdominal tenderness (patient has generalized tenderness to light palpation. his muscles all feel tender to him).  Musculoskeletal:     Right lower leg: No edema.     Left lower leg: No edema.     Comments: No joint swelling in major joints.  Skin:    General: Skin is warm and dry.  Neurological:     Mental Status: He is alert.     Comments: CN 2-12 grossly intact. 5/5 strength with upper extremity flexion, but weaker with extension. Can flex/extend ankles without difficulty, but has significant limitation to flexing/extending hips and knees. They will sometimes range passively, but later there seems to be resistance to passive flexing. Is able to stand with someone getting his legs off the stretcher. Can shuffle on his own but doesn't really lift his legs to walk. Grossly normal sensation.     ED Results / Procedures / Treatments   Labs (all labs ordered are listed, but only abnormal results are displayed) Labs Reviewed  BASIC METABOLIC PANEL - Abnormal; Notable for the following components:      Result Value   Glucose, Bld 129 (*)    All other components within normal limits  RESP PANEL BY RT-PCR (RSV, FLU A&B, COVID)  RVPGX2  CBC  CK  HEPATIC FUNCTION PANEL  URINALYSIS, ROUTINE W REFLEX MICROSCOPIC  RAPID URINE DRUG SCREEN, HOSP PERFORMED  TROPONIN I (HIGH SENSITIVITY)  TROPONIN I (HIGH SENSITIVITY)    EKG EKG Interpretation Date/Time:  Wednesday January 24 2023 09:12:56 EST Ventricular Rate:  57 PR Interval:  139 QRS Duration:  102 QT Interval:  419 QTC Calculation: 408 R Axis:   96  Text Interpretation: Sinus rhythm Atrial premature complex Consider left atrial enlargement Right axis deviation no significant change since Oct 2022  Confirmed by Pricilla Loveless [phone]) on 01/24/2023 9:31:49 AM  Radiology CT ABDOMEN PELVIS W CONTRAST  Result Date: 01/24/2023 CLINICAL DATA:  Abdominal pain. Postop from bronchoscopy and lung biopsy. EXAM: CT ABDOMEN AND PELVIS WITH CONTRAST TECHNIQUE: Multidetector CT imaging of the abdomen and pelvis was performed using the standard protocol following bolus administration of intravenous contrast. RADIATION DOSE REDUCTION: This exam was performed according to the departmental dose-optimization program which includes automated exposure control, adjustment of the mA and/or kV according to patient size and/or use of iterative reconstruction technique. CONTRAST:  75mL OMNIPAQUE IOHEXOL 350 MG/ML SOLN COMPARISON:  Chest CT on 01/08/2023, and AP CT on 09/11/2020 FINDINGS: Lower Chest: Spiculated nodule again seen in the right middle lobe measuring 2.5 x 1.2 cm. No pneumothorax or pleural fluid seen. Hepatobiliary: No suspicious hepatic masses identified. Gallbladder is unremarkable. No evidence of biliary ductal dilatation. Pancreas:  No mass or inflammatory changes. Spleen: Within normal limits in size and appearance. Adrenals/Urinary Tract: No suspicious masses identified. No evidence of ureteral calculi or hydronephrosis. Normal appearance of bladder. Stomach/Bowel: No evidence of obstruction, inflammatory process or abnormal fluid collections. Normal appendix visualized. Diffuse colonic diverticulosis is again seen, without signs of diverticulitis. Vascular/Lymphatic: No pathologically enlarged lymph nodes. No acute vascular findings. Reproductive:  No mass or other significant abnormality. Other:  None. Musculoskeletal:  No suspicious bone lesions identified. IMPRESSION: No acute findings within the abdomen or pelvis. Colonic diverticulosis, without radiographic evidence of diverticulitis. Stable spiculated pulmonary nodule in right middle lobe, suspicious for bronchogenic carcinoma. Electronically Signed    By: Danae Orleans M.D.   On: 01/24/2023 14:59   MR Lumbar Spine W Wo Contrast  Result Date: 01/24/2023 CLINICAL DATA:  Myelopathy, acute, lumbar spine. Diffuse pain following bronchoscopy 2 days ago. Unable to walk. EXAM: MRI LUMBAR SPINE WITHOUT AND WITH CONTRAST TECHNIQUE: Multiplanar and multiecho pulse sequences of the lumbar spine were obtained without and with intravenous contrast. CONTRAST:  7mL GADAVIST GADOBUTROL 1 MMOL/ML IV SOLN COMPARISON:  MRI of the lumbar spine without and with contrast 11/08/2016 FINDINGS:  Segmentation: 5 non rib-bearing lumbar type vertebral bodies are present. The lowest fully formed vertebral body is L5. Alignment: No significant listhesis is present. Mild straightening of the normal lumbar lordosis is similar the prior exam. Rightward curvature is again centered at L4. Vertebrae: Type 2 Modic changes have progressed at L4-5 and L5-S1. Progressive type 1 edematous changes are present at L3-4. Vertebral body heights are normal. Marrow signal is otherwise within normal limits. Conus medullaris and cauda equina: Conus extends to the L1 level. Conus and cauda equina appear normal. Paraspinal and other soft tissues: Limited imaging the abdomen is unremarkable. There is no significant adenopathy. No solid organ lesions are present. Disc levels: T12-L1: Insert normal disc L2-3: A progressive broad-based disc protrusion is present. Progressive moderate facet hypertrophy and ligamentum flavum thickening results in crowding of the nerve roots centrally. Mild foraminal narrowing is now present bilaterally. L3-4: A progressive leftward disc protrusion and asymmetric left-sided facet hypertrophy is present. Mild left subarticular stenosis is now present. Progressive moderate foraminal narrowing is present bilaterally, left greater than right. L4-5: A leftward disc protrusion is present. Moderate left subarticular and foraminal stenosis is similar the prior exam. Mild right foraminal  narrowing is stable. L5-S1: Right laminectomy is noted with decompression of the posterior central canal. Mild right foraminal narrowing is stable. No pathologic enhancement is present. IMPRESSION: 1. No acute abnormality. Normal appearance of the distal spinal cord. 2. Progressive multilevel spondylosis of the lumbar spine as described. 3. Progressive crowding of the nerve roots centrally at L2-3 with mild foraminal narrowing bilaterally. 4. Progressive mild left subarticular stenosis and moderate foraminal narrowing bilaterally at L3-4, left greater than right. 5. Stable moderate left subarticular and foraminal stenosis at L4-5. 6. Stable mild right foraminal narrowing at L4-5 and L5-S1. 7. Right laminectomy at L5-S1 with decompression of the posterior central canal. Electronically Signed   By: Marin Roberts M.D.   On: 01/24/2023 13:52   MR Cervical Spine W and Wo Contrast  Result Date: 01/24/2023 CLINICAL DATA:  Myelopathy, acute, cervical spine. Diffuse pain following bronchoscopy 2 days ago. Right-sided lung cancer. EXAM: MRI CERVICAL SPINE WITHOUT AND WITH CONTRAST TECHNIQUE: Multiplanar and multiecho pulse sequences of the cervical spine, to include the craniocervical junction and cervicothoracic junction, were obtained without and with intravenous contrast. CONTRAST:  7mL GADAVIST GADOBUTROL 1 MMOL/ML IV SOLN COMPARISON:  MRI of the cervical spine without contrast 01/01/2020 FINDINGS: Alignment: Slight anterolisthesis at C2-3, C3-4 and C6-7 is similar the prior exams. Slight retrolisthesis is again noted at C5-6 and C6-7. Straightening of the normal cervical lordosis is stable. Vertebrae: Progressive type 1 edematous endplate marrow changes are present on the right at C4-5 and to a lesser extent at C3-4. Vertebral body heights are maintained. No acute fractures are present. Cord: Normal signal and morphology. Posterior Fossa, vertebral arteries, paraspinal tissues: Craniocervical junction is  normal. Flow is present in the vertebral arteries bilaterally. The visualized intracranial contents are within normal limits. Disc levels: C2-3: A rightward disc osteophyte complex is present. Facet hypertrophy and spurring is present bilaterally. No significant stenosis or change is present. C3-4: A central disc protrusion has progressed. This contacts the ventral surface the cord without abnormal cord signal. Uncovertebral and facet hypertrophy is present bilaterally progressive moderate left and mild right foraminal stenosis is present. C4-5: A rightward disc osteophyte complex is present. Asymmetric right-sided facet hypertrophy is present. Partial effacement of ventral CSF is present. Severe right and moderate left foraminal stenosis is present. C5-6: A broad-based disc  osteophyte complex partially effaces the ventral CSF. Uncovertebral and facet disease leads to moderate foraminal narrowing bilaterally, left greater than right. This is stable. C6-7: A leftward disc osteophyte complex is present. Uncovertebral and facet hypertrophy is present. Moderate left foraminal stenosis is present. Severe left and mild right foraminal narrowing is present. C7-T1: Advanced facet hypertrophy is present. Uncovering of a broad-based disc protrusion is present. Endplate spurring is noted. Progressive moderate to severe central and bilateral foraminal stenosis is present. No pathologic enhancement is present. IMPRESSION: 1. No acute abnormality or focal cord signal abnormality. 2. No evidence for metastatic disease. 3. Progressive multilevel spondylosis of the cervical spine as described. 4. Progressive moderate to severe central and bilateral foraminal stenosis at C7-T1. 5. Progressive moderate left and mild right foraminal stenosis at C3-4. 6. Severe right and moderate left foraminal stenosis at C4-5. 7. Moderate foraminal narrowing bilaterally at C5-6, left greater than right. 8. Severe left and mild right foraminal stenosis  at C6-7. Electronically Signed   By: Marin Roberts M.D.   On: 01/24/2023 13:44   DG HIP UNILAT WITH PELVIS 2-3 VIEWS RIGHT  Result Date: 01/24/2023 CLINICAL DATA:  72 year old male with difficulty walking. Right hip pain. EXAM: DG HIP (WITH OR WITHOUT PELVIS) 2-3V RIGHT COMPARISON:  No priors. FINDINGS: AP view of the bony pelvis and AP and lateral views of the right hip demonstrate no acute displaced fracture of the bony pelvic ring or the visualized proximal right femur. Femoral head is located in the acetabulum. Joint space narrowing, subchondral sclerosis, subchondral cyst formation and osteophyte formation are noted in the right hip joint, indicative of mild-to-moderate osteoarthritis. IMPRESSION: 1. No acute radiographic abnormality of the bony pelvis or the right hip. 2. Mild-to-moderate right hip joint osteoarthritis. Electronically Signed   By: Trudie Reed M.D.   On: 01/24/2023 06:47   DG Chest 2 View  Result Date: 01/24/2023 CLINICAL DATA:  72 year old male with history of chest pain. Recent history of bronchoscopy. EXAM: CHEST - 2 VIEW COMPARISON:  Chest x-ray 01/22/2023. FINDINGS: Volumes are increased with emphysematous changes. Fiducial marker projecting over the right middle lobe related to recent biopsy. Ill-defined airspace disease surrounding the fiducial marker has decreased compared to the prior study, likely interval regression of previously noted post biopsy hemorrhage. No new acute consolidative airspace disease. No pleural effusions. No pneumothorax. No evidence of pulmonary edema. Heart size is normal. Upper mediastinal contours are within normal limits. Atherosclerotic calcifications are noted in the thoracic aorta. IMPRESSION: 1. Resolving post biopsy hemorrhage in the right middle lobe. No new acute findings are noted. 2. Emphysema. 3. Aortic atherosclerosis. Electronically Signed   By: Trudie Reed M.D.   On: 01/24/2023 06:46   DG HIP UNILAT WITH PELVIS 2-3  VIEWS LEFT  Result Date: 01/24/2023 CLINICAL DATA:  72 year old male with history of pain in the left hip. EXAM: DG HIP (WITH OR WITHOUT PELVIS) 2-3V LEFT COMPARISON:  04/03/2017. FINDINGS: Two views of the left hip demonstrate no acute displaced fracture, subluxation or dislocation. There is joint space narrowing, subchondral sclerosis, subchondral cyst formation and osteophyte formation in the hip joint, indicative of osteoarthritis. IMPRESSION: 1. No acute radiographic abnormality of the left hip. 2. Mild-to-moderate left hip joint osteoarthritis. Electronically Signed   By: Trudie Reed M.D.   On: 01/24/2023 06:43    Procedures Procedures    Medications Ordered in ED Medications  lactated ringers bolus 1,000 mL (0 mLs Intravenous Stopped 01/24/23 1046)  ketorolac (TORADOL) 15 MG/ML injection 15 mg (  15 mg Intravenous Given 01/24/23 0753)  HYDROmorphone (DILAUDID) injection 0.5 mg (0.5 mg Intravenous Given 01/24/23 0755)  LORazepam (ATIVAN) injection 0.5 mg (0.5 mg Intravenous Given 01/24/23 1138)  gadobutrol (GADAVIST) 1 MMOL/ML injection 7 mL (7 mLs Intravenous Contrast Given 01/24/23 1255)  iohexol (OMNIPAQUE) 350 MG/ML injection 75 mL (75 mLs Intravenous Contrast Given 01/24/23 1327)  dexamethasone (DECADRON) injection 10 mg (10 mg Intravenous Given 01/24/23 1513)    ED Course/ Medical Decision Making/ A&P Clinical Course as of 01/24/23 1543  Wed Jan 24, 2023  4332 Dr Wilford Corner is recommending MRI C and L spine given upper and lower complaints. With and without contrast.  Patient seems to have some mild improvement and certainly less pain but is not really moving his legs like normal.  To me he seems to have slightly more movement in them than he did before.  He is also still having significant abdominal tenderness so we will get a CT of his abdomen. [SG]    Clinical Course User Index [SG] Pricilla Loveless, MD                                 Medical Decision Making Amount and/or  Complexity of Data Reviewed External Data Reviewed: notes. Labs: ordered.    Details: Normal WBC.  Normal renal function. Radiology: ordered and independent interpretation performed.    Details: No pneumothorax.  No intra-abdominal emergency. ECG/medicine tests: ordered and independent interpretation performed.    Details: No ischemia.  Risk Prescription drug management.   MRIs obtained, and as above, has significant stenosis but no apparent emergent condition.  Neurology has seen patient and he has an inconsistent exam which I agree with, and they recommend talking to neurosurgery and considering LP for Guillain-Barr or AIDP.  Neurology talk to patient about LP as well as myself but after his discussion with wife and me answering questions at the bedside he does not want to do it.  He is able to ambulate into me seems a little better though he reports he is not much improved.  When he is at rest his knees and hips flex very easily though he walks stiffly.  I did discuss with neurosurgery on-call, Doran Durand, who has viewed images and feels that is possible the position he was laying in for surgery could have exacerbated some spinal issues he has though there is no emergent finding on the MRIs and it would be reasonable to treat with steroids.  Recommends a dose of Decadron here and Medrol Dosepak as an outpatient.  This would be okay with neurology as well.  He does have what is probably cancer but seems to be localized and I do not think systemic steroids would be a problem for his lung mass.  He ultimately has refused LP.  I do not think this is unreasonable but did note that that if symptoms do not improve or worsen he should return to the ER.  Otherwise, he appears stable for discharge home in the care of his wife.  He is complaining of significant pain so we will give a brief course of hydrocodone as it did seem like pain control did help him move his legs a little better.         Final  Clinical Impression(s) / ED Diagnoses Final diagnoses:  Spinal stenosis, unspecified spinal region    Rx / DC Orders ED Discharge Orders  Ordered    HYDROcodone-acetaminophen (NORCO) 5-325 MG tablet  Every 6 hours PRN        01/24/23 1509    methylPREDNISolone (MEDROL DOSEPAK) 4 MG TBPK tablet  Daily        01/24/23 1509              Pricilla Loveless, MD 01/24/23 1545

## 2023-01-24 NOTE — ED Notes (Signed)
Pt refusing to be hooked up to cardiac monitoring at this time. MD notified.

## 2023-01-24 NOTE — ED Notes (Signed)
Patient transported to MRI 

## 2023-01-24 NOTE — ED Notes (Signed)
PT still in MRI at this time.

## 2023-01-24 NOTE — Consult Note (Signed)
NEUROLOGY CONSULT NOTE   Date of service: January 24, 2023 Patient Name: Peter Sims MRN:  [id number] DOB:  [birth year] Chief Complaint: "bilateral leg weakness, with pain, also abd/chest pain" Requesting Provider: Pricilla Loveless, MD  History of Present Illness  Peter Sims is a 72 y.o. male  has a past medical history of Arthritis, Bullous emphysema (HCC), Cocaine abuse (HCC), Colitis, COPD (chronic obstructive pulmonary disease) (HCC), Coronary artery disease, Dyspnea, GERD (gastroesophageal reflux disease), Headache, Hypertension, LBP (low back pain), Lumbago, Lung mass, Myocardial infarction [facility]), Neck pain, Neuromuscular disorder (HCC), Neuropathy, Pneumonia, S/P angioplasty with stent 01/13/20 emergent DES to mLAD  (01/15/2020), Shortness of breath (03/03/2020), Spine pain, Therapeutic drug monitoring, and Tobacco abuse.   Who underwent fiberoptic bronchoscopy with robotic assistance and biopsy of lung mass in the right middle lobe suspicious for lung cancer due to a spiculated lesion seen on the CT chest, comes to the ER with complaints of 2 days worth of difficulty walking, bilateral leg pain and weakness and chest and abdominal pain. He reports that he had been in his usual state of health the day of the bronchoscopy 01/22/2023.  He had the procedure done, was discharged home-due to being somewhat wobbly after recovering from anesthesia, he was taken in a wheelchair dropped to his car and his wife drove him home.  He got to his bed without a problem and took a nap.  A few hours later on the same night he woke up with extensively severe leg pain and difficulty walking he was walking with a short shuffling gait and today he reports that he is so weak in his legs that he cannot even get out.  He also has been having trouble with chest pain and abdominal pain when he coughs, which she says was told that to be expected after procedure. Denies any previous similar episodes. Reports  ongoing weight loss. Denies any respiratory or GI illness in the past few weeks to months.  ROS  Comprehensive ROS performed and pertinent positives documented in HPI    Past History   Past Medical History:  Diagnosis Date   Arthritis    Bullous emphysema (HCC)    Severe Bilateral   Cocaine abuse (HCC)    Colitis    with bleeding   COPD (chronic obstructive pulmonary disease) (HCC)    Coronary artery disease    Dyspnea    GERD (gastroesophageal reflux disease)    Headache    sinusitis   Hypertension    LBP (low back pain)    now chronic   Lumbago    Lung mass    Right Upper Lobe pleural based mass, negative  on PET Scan   Myocardial infarction [facility])    Neck pain    nerve pain    Neuromuscular disorder (HCC)    Neuropathy    right legf, calf, foot   Pneumonia    hx   S/P angioplasty with stent 01/13/20 emergent DES to mLAD  01/15/2020   Shortness of breath 03/03/2020   Spine pain    nerve   Therapeutic drug monitoring    Tobacco abuse     Past Surgical History:  Procedure Laterality Date   BRONCHIAL BIOPSY  01/22/2023   Procedure: BRONCHIAL BIOPSIES;  Surgeon: Leslye Peer, MD;  Location: [facility] ENDOSCOPY;  Service: Pulmonary;;   BRONCHIAL BRUSHINGS  01/22/2023   Procedure: BRONCHIAL BRUSHINGS;  Surgeon: Leslye Peer, MD;  Location: [facility] ENDOSCOPY;  Service: Pulmonary;;   BRONCHIAL NEEDLE  ASPIRATION BIOPSY  01/22/2023   Procedure: BRONCHIAL NEEDLE ASPIRATION BIOPSIES;  Surgeon: Leslye Peer, MD;  Location: [facility] ENDOSCOPY;  Service: Pulmonary;;   BRONCHIAL WASHINGS  01/22/2023   Procedure: BRONCHIAL WASHINGS;  Surgeon: Leslye Peer, MD;  Location: [facility] ENDOSCOPY;  Service: Pulmonary;;   CARDIAC CATHETERIZATION     COLONOSCOPY     CORONARY/GRAFT ACUTE MI REVASCULARIZATION N/A 01/14/2020   Procedure: Coronary/Graft Acute MI Revascularization;  Surgeon: Tonny Bollman, MD;  Location: [facility] INVASIVE CV LAB;  Service: Cardiovascular;  Laterality: N/A;   FIDUCIAL  MARKER PLACEMENT  01/22/2023   Procedure: FIDUCIAL MARKER PLACEMENT;  Surgeon: Leslye Peer, MD;  Location: [facility] ENDOSCOPY;  Service: Pulmonary;;   LEFT HEART CATH AND CORONARY ANGIOGRAPHY N/A 01/14/2020   Procedure: LEFT HEART CATH AND CORONARY ANGIOGRAPHY;  Surgeon: Tonny Bollman, MD;  Location: [facility] INVASIVE CV LAB;  Service: Cardiovascular;  Laterality: N/A;   LEFT HEART CATH AND CORONARY ANGIOGRAPHY N/A 12/13/2020   Procedure: LEFT HEART CATH AND CORONARY ANGIOGRAPHY;  Surgeon: Tonny Bollman, MD;  Location: [facility] INVASIVE CV LAB;  Service: Cardiovascular;  Laterality: N/A;   LUMBAR LAMINECTOMY/DECOMPRESSION MICRODISCECTOMY Right 05/03/2016   Procedure: MICRODISCECTOMY LUMBAR FIVE- SACRAL ONE RIGHT;  Surgeon: Coletta Memos, MD;  Location: [facility] OR;  Service: Neurosurgery;  Laterality: Right;   NO PAST SURGERIES      Family History: Family History  Adopted: Yes  Problem Relation Age of Onset   Cancer Father    Lung cancer Father    Heart failure Sister    Breast cancer Sister    Emphysema Mother     Social History  reports that he has been smoking cigarettes. He has a 11.3 pack-year smoking history. He has been exposed to tobacco smoke. He has never used smokeless tobacco. He reports that he does not currently use alcohol. He reports current drug use. Drugs: Cocaine and Marijuana.  Allergies  Allergen Reactions   Penicillins Anaphylaxis and Hives    Medications  No current facility-administered medications for this encounter.  Current Outpatient Medications:    acetaminophen (TYLENOL) 325 MG tablet, Take 2 tablets (650 mg total) by mouth every 4 (four) hours as needed for headache or mild pain., Disp: , Rfl:    albuterol (PROVENTIL) (2.5 MG/3ML) 0.083% nebulizer solution, Take 3 mLs (2.5 mg total) by nebulization every 6 (six) hours as needed for wheezing or shortness of breath., Disp: 75 mL, Rfl: 2   albuterol (VENTOLIN HFA) 108 (90 Base) MCG/ACT inhaler, INHALE 2 PUFFS INTO THE  LUNGS EVERY 6 (SIX) HOURS AS NEEDED FOR WHEEZING OR SHORTNESS OF BREATH., Disp: 8.5 g, Rfl: 2   aspirin 81 MG chewable tablet, Chew 1 tablet (81 mg total) by mouth daily., Disp: , Rfl:    atorvastatin (LIPITOR) 80 MG tablet, Take 1 tablet (80 mg total) by mouth daily., Disp: 90 tablet, Rfl: 2   Budeson-Glycopyrrol-Formoterol (BREZTRI AEROSPHERE) 160-9-4.8 MCG/ACT AERO, Inhale 2 puffs into the lungs 2 (two) times daily., Disp: 10.7 g, Rfl: 2   diclofenac Sodium (VOLTAREN) 1 % GEL, Apply 2 g topically 2 (two) times daily as needed (joint pain)., Disp: 100 g, Rfl: 2   famotidine (PEPCID) 40 MG tablet, Take 1 tablet (40 mg total) by mouth 2 (two) times daily., Disp: 60 tablet, Rfl: 12   metoprolol succinate (TOPROL XL) 25 MG 24 hr tablet, Take 1/2 tablet (12.5 mg total) by mouth daily., Disp: 90 tablet, Rfl: 2   montelukast (SINGULAIR) 10 MG tablet, Take 1 tablet (10 mg total)  by mouth every evening. (Patient taking differently: Take 10 mg by mouth daily as needed (Asthma).), Disp: 30 tablet, Rfl: 11   nicotine polacrilex (NICORETTE) 2 MG lozenge, Use one lozenge every 2-3 hours as needed, Disp: 72 tablet, Rfl: 0   nitroGLYCERIN (NITROSTAT) 0.4 MG SL tablet, Place 1 tablet (0.4 mg total) under the tongue every 5 (five) minutes as needed for chest pain., Disp: 25 tablet, Rfl: 4   pantoprazole (PROTONIX) 40 MG tablet, Take 1 tablet (40 mg total) by mouth daily. STOP OMEPRAZOLE, Disp: 90 tablet, Rfl: 3   pregabalin (LYRICA) 50 MG capsule, Take 1 capsule (50 mg total) by mouth 2 (two) times daily as needed., Disp: 180 capsule, Rfl: 0   tamsulosin (FLOMAX) 0.4 MG CAPS capsule, Take 2 capsules (0.8 mg total) by mouth at bedtime. Pt needs follow up for additional refills (Patient taking differently: Take 0.4 mg by mouth at bedtime.), Disp: 90 capsule, Rfl: 2   valsartan (DIOVAN) 160 MG tablet, Take 1 tablet (160 mg total) by mouth daily., Disp: 30 tablet, Rfl: 11   ipratropium-albuterol (DUONEB) 0.5-2.5 (3)  MG/3ML SOLN, Take 3 mLs by nebulization as needed. (Patient not taking: Reported on 01/24/2023), Disp: , Rfl:    nicotine (NICOTROL) 10 MG inhaler, Inhale 1 Cartridge (1 continuous puffing total) into the lungs as needed for smoking cessation. (Patient not taking: Reported on 01/24/2023), Disp: 42 each, Rfl: 0   Respiratory Therapy Supplies (FLUTTER) DEVI, Use as directed, Disp: 1 each, Rfl: 0  Vitals   Vitals:   01/24/23 1105 01/24/23 1327 01/24/23 1328 01/24/23 1328  BP:  (!) 145/106    Pulse: 70 79 73   Resp: 17 17 17    Temp:    (!) 97.4 F (36.3 C)  TempSrc:    Axillary  SpO2: 99% 100% 100%     There is no height or weight on file to calculate BMI.  Physical Exam  General: Well-developed well-nourished man no acute distress. HEENT: Normocephalic atraumatic Lungs: Scattered rales Cardiovascular: Regular rhythm Abdomen nondistended nontender Neurological exam Awake alert oriented x 3 No dysarthria No aphasia Cranial nerves II to XII intact Motor examination with no drift in the upper extremities with 5/5 strength in bilateral upper extremities. His lower extremity exam is difficult-most of the exam is limited by pain and effort. He actively tries to raise his leg 1 at a time with positive Hoover sign. On passively trying to raise his leg, he yells in pain and does not participate.  Report pain down the groin, hip and back. No pain on the knee or ankle with passive flexion at the ankle. Sensory examination: Intact sensation to light touch without extinction all over.  To vibratory sense, he reports weaker vibration perception over the right knee in comparison to the right toe raising suspicion for some nonorganic findings, otherwise unremarkable. Coordination: No dysmetria in the upper extremity.  Unable to check in the lower extremity. Deep tendon reflexes: Could not elicit ankle jerks bilaterally, right knee jerk was absent, 1+ left knee jerk.  He had 1-2+ brachioradialis and  biceps reflexes. Gait testing deferred due to extreme pain and discomfort that he is reporting   Labs/Imaging/Neurodiagnostic studies   CBC:  Recent Labs  Lab Jan 23, 2023 0611 01/24/23 0550  WBC 7.7 7.2  HGB 15.2 16.0  HCT 47.1 48.7  MCV 89.0 88.1  PLT 204 212   Basic Metabolic Panel:  Lab Results  Component Value Date   NA 139 01/24/2023   K 4.2  01/24/2023   CO2 24 01/24/2023   GLUCOSE 129 (H) 01/24/2023   BUN 11 01/24/2023   CREATININE 1.03 01/24/2023   CALCIUM 9.9 01/24/2023   GFRNONAA >60 01/24/2023   GFRAA 74 12/02/2019   Lipid Panel:  Lab Results  Component Value Date   LDLCALC 72 08/03/2022   HgbA1c:  Lab Results  Component Value Date   HGBA1C 6.4 (H) 08/03/2022   Urine Drug Screen:     Component Value Date/Time   LABOPIA NONE DETECTED 01/13/2020 2203   COCAINSCRNUR NONE DETECTED 01/13/2020 2203   COCAINSCRNUR NEGATIVE 05/19/2010 1228   LABBENZ NONE DETECTED 01/13/2020 2203   LABBENZ NEGATIVE 05/19/2010 1228   AMPHETMU NONE DETECTED 01/13/2020 2203   THCU POSITIVE (A) 01/13/2020 2203   LABBARB NONE DETECTED 01/13/2020 2203    Alcohol Level No results found for: "ETH" INR  Lab Results  Component Value Date   INR 1.0 01/14/2020   APTT  Lab Results  Component Value Date   APTT 71 (H) 01/14/2020   Imaging personally reviewed  Hip and pelvis imaging unremarkable  MRI of the cervical spine: Shows no evidence of metastatic disease.  No acute abnormality or focal cord signal abnormality.  Progressive multilevel spondylosis of the cervical spine.  Progressive moderate to severe central and bilateral foraminal stenosis at C7-T1, progressive moderate left and right foraminal stenosis at C3-4, severe right and moderate left foraminal stenosis at C4-5, moderate foraminal narrowing bilaterally at C5-6 left and the right.  Severe left and right foraminal stenosis at [facility].  MR lumbar spine: No acute abnormality.  Normal appearance of the distal spinal cord.   Progressive multilevel spondylosis of the lumbar spine.  Progressive crowding of nerve root centrally at L2-3 with mild foraminal narrowing bilaterally.  Progressive mild left subarticular stenosis and moderate foraminal narrowing bilaterally at L3-4 left greater than right.  Stable moderate left subarticular and foraminal stenosis at L4-5.  Stable mild right foraminal narrowing at L4-5 and L5-S1.  Right laminectomy at L5-S1 with decompression of the posterior central canal.   ASSESSMENT   Peter Sims is a 72 y.o. male  has a past medical history of Arthritis, Bullous emphysema (HCC), Cocaine abuse (HCC), Colitis, COPD (chronic obstructive pulmonary disease) (HCC), Coronary artery disease, Dyspnea, GERD (gastroesophageal reflux disease), Headache, Hypertension, LBP (low back pain), Lumbago, Lung mass, Myocardial infarction [facility]), Neck pain, Neuromuscular disorder (HCC), Neuropathy, Pneumonia, S/P angioplasty with stent 01/13/20 emergent DES to mLAD  (01/15/2020), Shortness of breath (03/03/2020), Spine pain, Therapeutic drug monitoring, and Tobacco abuse.  Who had a lung mass biopsy 2 days ago came back with complaints of 2 days worth of abdominal pain chest pain difficulty walking and severe leg pain.  Difficult examination although there was some nonorganic findings, he did have absent DTRs in the lower extremities.  I am not certain that his weakness is related to recent procedure but there could be some acute worsening of chronic back pain and lumbar spine issues that he has had.  The absence of DTRs makes me wonder if this is some sort of AIDP/Guillain-Barr syndrome. My strong suspicion is that the current presentation is likely functional neurological disorder but given his risk factors, I would like to recommend further workup to look for CSF albumin cytological dissociation. Other etiologies could be cervical or lumbar degenerative disc disease.  Impression: Leg pain and weakness of acute  onset, areflexia-evaluate for AIDP, multilevel cervical and lumbar degenerative disc disease.  RECOMMENDATIONS  Due to difficult exam and  DTRs not clearly present, will recommend LP to look for albuminocytological disscoation Consult NSGY for any recs from a surgical standpoint. Will be available to follow the CSF results and recommend further measures    ADDENDUM Pt not willing to get LP per EDP and is leaving to go home. Neurosurgery recommends steroids. I am ok with it if OK from onc perspective. Follow up with neurology and neurosurgery as outpatient if not resolved. D/W Dr. Criss Alvine.  ______________________________________________________________________    Signed, Milon Dikes, MD [facility]

## 2023-01-24 NOTE — ED Notes (Signed)
Attempted to call [facility] but they stated the case number was incorrect.

## 2023-01-24 NOTE — ED Notes (Signed)
Patient transported to CT 

## 2023-01-30 ENCOUNTER — Other Ambulatory Visit (HOSPITAL_COMMUNITY): Payer: Self-pay

## 2023-01-30 ENCOUNTER — Other Ambulatory Visit: Payer: Self-pay | Admitting: Internal Medicine

## 2023-01-30 ENCOUNTER — Ambulatory Visit (INDEPENDENT_AMBULATORY_CARE_PROVIDER_SITE_OTHER): Payer: Commercial Managed Care - PPO | Admitting: Acute Care

## 2023-01-30 ENCOUNTER — Encounter: Payer: Self-pay | Admitting: Acute Care

## 2023-01-30 VITALS — BP 130/80 | HR 89 | Ht 72.0 in | Wt 171.2 lb

## 2023-01-30 DIAGNOSIS — C3491 Malignant neoplasm of unspecified part of right bronchus or lung: Secondary | ICD-10-CM

## 2023-01-30 DIAGNOSIS — M50121 Cervical disc disorder at C4-C5 level with radiculopathy: Secondary | ICD-10-CM

## 2023-01-30 DIAGNOSIS — F17200 Nicotine dependence, unspecified, uncomplicated: Secondary | ICD-10-CM | POA: Diagnosis not present

## 2023-01-30 DIAGNOSIS — M51369 Other intervertebral disc degeneration, lumbar region without mention of lumbar back pain or lower extremity pain: Secondary | ICD-10-CM | POA: Diagnosis not present

## 2023-01-30 DIAGNOSIS — Z9889 Other specified postprocedural states: Secondary | ICD-10-CM

## 2023-01-30 MED ORDER — VALSARTAN 160 MG PO TABS
160.0000 mg | ORAL_TABLET | Freq: Every day | ORAL | 2 refills | Status: DC
  Filled 2023-01-30: qty 90, 90d supply, fill #0
  Filled 2023-04-30: qty 90, 90d supply, fill #1
  Filled 2023-07-27: qty 90, 90d supply, fill #2

## 2023-01-30 MED ORDER — BREZTRI AEROSPHERE 160-9-4.8 MCG/ACT IN AERO
2.0000 | INHALATION_SPRAY | Freq: Two times a day (BID) | RESPIRATORY_TRACT | 2 refills | Status: DC
  Filled 2023-01-30: qty 10.7, 30d supply, fill #0
  Filled 2023-04-09: qty 10.7, 30d supply, fill #1
  Filled 2023-04-30 – 2023-05-03 (×2): qty 10.7, 30d supply, fill #2

## 2023-01-30 NOTE — Patient Instructions (Signed)
It is good to see you today. Your biopsy was positive for squamous cell lung cancer . I have referred you back to Dr. Cliffton Asters to be evaluated for surgery. PFT's have been done.  This is an urgent referral , so you should get a call to get this scheduled. Dr. Cliffton Asters and his team will take great care of you.  Please call us if you need [place].  Please contact office for sooner follow up if symptoms do not improve or worsen or seek emergency care

## 2023-01-30 NOTE — Progress Notes (Signed)
History of Present Illness Peter Sims is a 72 y.o. male current every day smoker followed through the lung cancer screening program. He was seen by Dr. Delton Coombes 12/29/2022 for evaluation of an abnormal  pleural based right lung lesion.   Synopsis 72 year old with a history of tobacco use and COPD, has been under observation for a pulmonary nodule detected on a CT scan. The patient has been followed for COPD and a noted pulmonary nodule. A lung cancer screening CT scan revealed a 6.3 by 1.7 cm pleural based anterior right lesion, stable on prior scans, and an irregular 16mm spiculated posterior right middle lobe nodule abutting the major fissure, raising suspicion for primary lung cancer. A subsequent PET scan confirmed a hypermetabolic 2.4 by 1.2 cm irregular right middle lobe nodule, with no hypermetabolic thoracic lymphadenopathy.  The patient has been on a regimen of Breztri for COPD and has been using albuterol sparingly. The patient has also been taking a baby aspirin and metoprolol, which he believed to be a blood thinner. The patient has been trying to reduce smoking, currently down to about five or six cigarettes a day, and has been using gum and lozenges to aid in this effort.    Pt. Underwent Flexible video fiberoptic bronchoscopy with robotic assistance and biopsies on 01/22/2023. He is here for post bronchoscopy follow up, as well as cytology review.    01/31/2023 Pt. Presents for follow up after Flexible video fiberoptic bronchoscopy with robotic assistance and biopsies. He states he had issues with paralysis after the procedure.  This was ongoing for 2 days before he went to the ED on Wednesday 11/27. He was there for 12 hours. He had multiple tests done. MRI of the c spine was done, a neurology consult was completed by Dr. Wilford Corner. Cranial nerves II to XII were  intact. MR imaging showed  cervical and lumbar degenerative disease. Dr. Wilford Corner felt that this presentation was likely  functional neurological disorder but given his risk factors, he recommended further workup with LP  to look for CSF albumin cytological dissociation. Other etiologies considered were cervical or lumbar degenerative disc disease.Pt. was not willing to get LP per EDP and left to go home. Neurosurgery then recommended  steroids, and Follow up with neurology and neurosurgery as outpatient if not resolved Pt. States he  feels  this was related to his neck being hyper extended for a prolonged time. His arms were weak, he was unable to lift his arms or legs. His sister who is a trauma nurse , applied hot and cool compresses to his neck which resolved his symptoms. He denies any prolonged bleeding, scant blood in sputum  x  one day then self resolved, He denies shortness of breath, fever or infection. He is back to his baseline   We discussed the patient's cytology results. The right middle lobe lung nodule was positive for squamous cell lung cancer. We discussed options of both surgery, and radiation. He would like to return to see Dr. Cliffton Asters for consideration of surgery. PFT's have been done, but patient has severely emphysematous lungs. He is still smoking. I am unsure if  he will be a good surgical candidate, but referral has been made. Dr. Cliffton Asters will refer to radiation oncology if patient is not a surgical candidate.Pt. is in agreement with this plan. Now that we have diagnosis, his treatment, regardless of the option he chooses/ qualifies for  needs to be expedited.   Test Results: Cytology 01/22/2023 FINAL MICROSCOPIC  DIAGNOSIS:   A. LUNG, RML, FINE NEEDLE ASPIRATION  BIOPSIES:  - Squamous cell carcinoma  - See comment   B. LUNG, RML, BRUSHING:  - Squamous cell carcinoma  - See comment   COMMENT:  Dr. Venetia Night agrees.   Dr. Delton Coombes was notified via Epic secure chat on 01/23/2023.   SPECIMEN ADEQUACY:  A.Satisfactory for evaluation but limited, scanty  B.Satisfactory for Evaluation    IMMEDIATE EVALUATION:  B.  ATYPICAL CELLS (HL/ 01/21/23 @ 9:20am.)    C. LUNG, RML, LAVAGE:    FINAL MICROSCOPIC DIAGNOSIS:  - Squamous cell carcinoma   MRI Spine 01/24/2023 Progressive multilevel spondylosis of the cervical spine. Progressive moderate to severe central and bilateral foraminal stenosis at C7-T1, progressive moderate left and right foraminal stenosis at C3-4, severe right and moderate left foraminal stenosis at C4-5, moderate foraminal narrowing bilaterally at C5-6 left and the right. Severe left and right foraminal stenosis at [facility] 7.   MR Lumbar Spine 01/24/2023 Progressive multilevel spondylosis of the lumbar spine. Progressive crowding of nerve root centrally at L2-3 with mild foraminal narrowing bilaterally. Progressive mild left subarticular stenosis and moderate foraminal narrowing bilaterally at L3-4 left greater than right. Stable moderate left subarticular and foraminal stenosis at L4-5. Stable mild right foraminal narrowing at L4-5 and L5-S1. Right laminectomy at L5-S1 with decompression of the posterior central canal.   CT scan of the chest: 6.3 x 1.7 cm pleural-based anterior right lesion, stable on prior scans; 16 mm spiculated posterior right middle lobe nodule abutting the major fissure, suspicious for primary lung cancer; 10 mm subcarinal node (10/13/2022) 11/23/2022 PET scan: Hypermetabolic 2.4 x 1.2 cm irregular right middle lobe nodule; stable pleural-based anterior right lesion with no hypermetabolism; no hypermetabolic thoracic lymphadenopathy (11/23/2022)     Latest Ref Rng & Units 01/24/2023    5:50 AM 01/22/2023    6:11 AM 10/02/2022    4:25 PM  CBC  WBC 4.0 - 10.5 K/uL 7.2  7.7  7.1   Hemoglobin 13.0 - 17.0 g/dL 40.9  81.1  91.4   Hematocrit 39.0 - 52.0 % 48.7  47.1  48.6   Platelets 150 - 400 K/uL 212  204  225        Latest Ref Rng & Units 01/24/2023    5:50 AM 01/22/2023    6:11 AM 08/03/2022    9:54 AM  BMP  Glucose 70 - 99 mg/dL 782   956  213   BUN 8 - 23 mg/dL 11  17  11    Creatinine 0.61 - 1.24 mg/dL 0.86  5.78  4.69   BUN/Creat Ratio 10 - 24   10   Sodium 135 - 145 mmol/L 139  136  142   Potassium 3.5 - 5.1 mmol/L 4.2  3.9  4.3   Chloride 98 - 111 mmol/L 106  107  104   CO2 22 - 32 mmol/L 24  19  22    Calcium 8.9 - 10.3 mg/dL 9.9  9.4  9.7     BNP    Component Value Date/Time   BNP 6.6 07/27/2014 1556    ProBNP    Component Value Date/Time   PROBNP 26.1 12/05/2011 1302    PFT    Component Value Date/Time   FEV1PRE 2.32 11/21/2022 0835   FEV1POST 2.37 11/21/2022 0835   FVCPRE 3.39 11/21/2022 0835   FVCPOST 3.38 11/21/2022 0835   TLC 5.63 11/21/2022 0835   DLCOUNC 18.04 11/21/2022 0835   PREFEV1FVCRT 69 11/21/2022 0835  PSTFEV1FVCRT 70 11/21/2022 0835    CT ABDOMEN PELVIS W CONTRAST  Result Date: 01/24/2023 CLINICAL DATA:  Abdominal pain. Postop from bronchoscopy and lung biopsy. EXAM: CT ABDOMEN AND PELVIS WITH CONTRAST TECHNIQUE: Multidetector CT imaging of the abdomen and pelvis was performed using the standard protocol following bolus administration of intravenous contrast. RADIATION DOSE REDUCTION: This exam was performed according to the departmental dose-optimization program which includes automated exposure control, adjustment of the mA and/or kV according to patient size and/or use of iterative reconstruction technique. CONTRAST:  75mL OMNIPAQUE IOHEXOL 350 MG/ML SOLN COMPARISON:  Chest CT on 01/08/2023, and AP CT on 09/11/2020 FINDINGS: Lower Chest: Spiculated nodule again seen in the right middle lobe measuring 2.5 x 1.2 cm. No pneumothorax or pleural fluid seen. Hepatobiliary: No suspicious hepatic masses identified. Gallbladder is unremarkable. No evidence of biliary ductal dilatation. Pancreas:  No mass or inflammatory changes. Spleen: Within normal limits in size and appearance. Adrenals/Urinary Tract: No suspicious masses identified. No evidence of ureteral calculi or hydronephrosis.  Normal appearance of bladder. Stomach/Bowel: No evidence of obstruction, inflammatory process or abnormal fluid collections. Normal appendix visualized. Diffuse colonic diverticulosis is again seen, without signs of diverticulitis. Vascular/Lymphatic: No pathologically enlarged lymph nodes. No acute vascular findings. Reproductive:  No mass or other significant abnormality. Other:  None. Musculoskeletal:  No suspicious bone lesions identified. IMPRESSION: No acute findings within the abdomen or pelvis. Colonic diverticulosis, without radiographic evidence of diverticulitis. Stable spiculated pulmonary nodule in right middle lobe, suspicious for bronchogenic carcinoma. Electronically Signed   By: Danae Orleans M.D.   On: 01/24/2023 14:59   MR Lumbar Spine W Wo Contrast  Result Date: 01/24/2023 CLINICAL DATA:  Myelopathy, acute, lumbar spine. Diffuse pain following bronchoscopy 2 days ago. Unable to walk. EXAM: MRI LUMBAR SPINE WITHOUT AND WITH CONTRAST TECHNIQUE: Multiplanar and multiecho pulse sequences of the lumbar spine were obtained without and with intravenous contrast. CONTRAST:  7mL GADAVIST GADOBUTROL 1 MMOL/ML IV SOLN COMPARISON:  MRI of the lumbar spine without and with contrast 11/08/2016 FINDINGS: Segmentation: 5 non rib-bearing lumbar type vertebral bodies are present. The lowest fully formed vertebral body is L5. Alignment: No significant listhesis is present. Mild straightening of the normal lumbar lordosis is similar the prior exam. Rightward curvature is again centered at L4. Vertebrae: Type 2 Modic changes have progressed at L4-5 and L5-S1. Progressive type 1 edematous changes are present at L3-4. Vertebral body heights are normal. Marrow signal is otherwise within normal limits. Conus medullaris and cauda equina: Conus extends to the L1 level. Conus and cauda equina appear normal. Paraspinal and other soft tissues: Limited imaging the abdomen is unremarkable. There is no significant  adenopathy. No solid organ lesions are present. Disc levels: T12-L1: Insert normal disc L2-3: A progressive broad-based disc protrusion is present. Progressive moderate facet hypertrophy and ligamentum flavum thickening results in crowding of the nerve roots centrally. Mild foraminal narrowing is now present bilaterally. L3-4: A progressive leftward disc protrusion and asymmetric left-sided facet hypertrophy is present. Mild left subarticular stenosis is now present. Progressive moderate foraminal narrowing is present bilaterally, left greater than right. L4-5: A leftward disc protrusion is present. Moderate left subarticular and foraminal stenosis is similar the prior exam. Mild right foraminal narrowing is stable. L5-S1: Right laminectomy is noted with decompression of the posterior central canal. Mild right foraminal narrowing is stable. No pathologic enhancement is present. IMPRESSION: 1. No acute abnormality. Normal appearance of the distal spinal cord. 2. Progressive multilevel spondylosis of the lumbar spine as  described. 3. Progressive crowding of the nerve roots centrally at L2-3 with mild foraminal narrowing bilaterally. 4. Progressive mild left subarticular stenosis and moderate foraminal narrowing bilaterally at L3-4, left greater than right. 5. Stable moderate left subarticular and foraminal stenosis at L4-5. 6. Stable mild right foraminal narrowing at L4-5 and L5-S1. 7. Right laminectomy at L5-S1 with decompression of the posterior central canal. Electronically Signed   By: Marin Roberts M.D.   On: 01/24/2023 13:52   MR Cervical Spine W and Wo Contrast  Result Date: 01/24/2023 CLINICAL DATA:  Myelopathy, acute, cervical spine. Diffuse pain following bronchoscopy 2 days ago. Right-sided lung cancer. EXAM: MRI CERVICAL SPINE WITHOUT AND WITH CONTRAST TECHNIQUE: Multiplanar and multiecho pulse sequences of the cervical spine, to include the craniocervical junction and cervicothoracic junction,  were obtained without and with intravenous contrast. CONTRAST:  7mL GADAVIST GADOBUTROL 1 MMOL/ML IV SOLN COMPARISON:  MRI of the cervical spine without contrast 01/01/2020 FINDINGS: Alignment: Slight anterolisthesis at C2-3, C3-4 and C6-7 is similar the prior exams. Slight retrolisthesis is again noted at C5-6 and C6-7. Straightening of the normal cervical lordosis is stable. Vertebrae: Progressive type 1 edematous endplate marrow changes are present on the right at C4-5 and to a lesser extent at C3-4. Vertebral body heights are maintained. No acute fractures are present. Cord: Normal signal and morphology. Posterior Fossa, vertebral arteries, paraspinal tissues: Craniocervical junction is normal. Flow is present in the vertebral arteries bilaterally. The visualized intracranial contents are within normal limits. Disc levels: C2-3: A rightward disc osteophyte complex is present. Facet hypertrophy and spurring is present bilaterally. No significant stenosis or change is present. C3-4: A central disc protrusion has progressed. This contacts the ventral surface the cord without abnormal cord signal. Uncovertebral and facet hypertrophy is present bilaterally progressive moderate left and mild right foraminal stenosis is present. C4-5: A rightward disc osteophyte complex is present. Asymmetric right-sided facet hypertrophy is present. Partial effacement of ventral CSF is present. Severe right and moderate left foraminal stenosis is present. C5-6: A broad-based disc osteophyte complex partially effaces the ventral CSF. Uncovertebral and facet disease leads to moderate foraminal narrowing bilaterally, left greater than right. This is stable. C6-7: A leftward disc osteophyte complex is present. Uncovertebral and facet hypertrophy is present. Moderate left foraminal stenosis is present. Severe left and mild right foraminal narrowing is present. C7-T1: Advanced facet hypertrophy is present. Uncovering of a broad-based disc  protrusion is present. Endplate spurring is noted. Progressive moderate to severe central and bilateral foraminal stenosis is present. No pathologic enhancement is present. IMPRESSION: 1. No acute abnormality or focal cord signal abnormality. 2. No evidence for metastatic disease. 3. Progressive multilevel spondylosis of the cervical spine as described. 4. Progressive moderate to severe central and bilateral foraminal stenosis at C7-T1. 5. Progressive moderate left and mild right foraminal stenosis at C3-4. 6. Severe right and moderate left foraminal stenosis at C4-5. 7. Moderate foraminal narrowing bilaterally at C5-6, left greater than right. 8. Severe left and mild right foraminal stenosis at C6-7. Electronically Signed   By: Marin Roberts M.D.   On: 01/24/2023 13:44   DG HIP UNILAT WITH PELVIS 2-3 VIEWS RIGHT  Result Date: 01/24/2023 CLINICAL DATA:  72 year old male with difficulty walking. Right hip pain. EXAM: DG HIP (WITH OR WITHOUT PELVIS) 2-3V RIGHT COMPARISON:  No priors. FINDINGS: AP view of the bony pelvis and AP and lateral views of the right hip demonstrate no acute displaced fracture of the bony pelvic ring or the visualized proximal right  femur. Femoral head is located in the acetabulum. Joint space narrowing, subchondral sclerosis, subchondral cyst formation and osteophyte formation are noted in the right hip joint, indicative of mild-to-moderate osteoarthritis. IMPRESSION: 1. No acute radiographic abnormality of the bony pelvis or the right hip. 2. Mild-to-moderate right hip joint osteoarthritis. Electronically Signed   By: Trudie Reed M.D.   On: 01/24/2023 06:47   DG Chest 2 View  Result Date: 01/24/2023 CLINICAL DATA:  72 year old male with history of chest pain. Recent history of bronchoscopy. EXAM: CHEST - 2 VIEW COMPARISON:  Chest x-ray 01/22/2023. FINDINGS: Volumes are increased with emphysematous changes. Fiducial marker projecting over the right middle lobe related to  recent biopsy. Ill-defined airspace disease surrounding the fiducial marker has decreased compared to the prior study, likely interval regression of previously noted post biopsy hemorrhage. No new acute consolidative airspace disease. No pleural effusions. No pneumothorax. No evidence of pulmonary edema. Heart size is normal. Upper mediastinal contours are within normal limits. Atherosclerotic calcifications are noted in the thoracic aorta. IMPRESSION: 1. Resolving post biopsy hemorrhage in the right middle lobe. No new acute findings are noted. 2. Emphysema. 3. Aortic atherosclerosis. Electronically Signed   By: Trudie Reed M.D.   On: 01/24/2023 06:46   DG HIP UNILAT WITH PELVIS 2-3 VIEWS LEFT  Result Date: 01/24/2023 CLINICAL DATA:  72 year old male with history of pain in the left hip. EXAM: DG HIP (WITH OR WITHOUT PELVIS) 2-3V LEFT COMPARISON:  04/03/2017. FINDINGS: Two views of the left hip demonstrate no acute displaced fracture, subluxation or dislocation. There is joint space narrowing, subchondral sclerosis, subchondral cyst formation and osteophyte formation in the hip joint, indicative of osteoarthritis. IMPRESSION: 1. No acute radiographic abnormality of the left hip. 2. Mild-to-moderate left hip joint osteoarthritis. Electronically Signed   By: Trudie Reed M.D.   On: 01/24/2023 06:43   DG Chest Port 1 View  Result Date: 01/22/2023 CLINICAL DATA:  Post bronchoscopy with biopsy. EXAM: PORTABLE CHEST 1 VIEW COMPARISON:  02/03/2022 and CT chest 01/08/2023. FINDINGS: Trachea is midline. Heart size is accentuated by AP apical lordotic technique. New fiducial marker in the right perihilar region with associated small area airspace opacification. No definite pneumothorax although apical lordotic view is somewhat challenging. Right apical pleural mass, as on 01/08/2023. IMPRESSION: 1. Post bronchoscopic biopsy with a fiducial marker in the right middle lobe and probable associated mild  hemorrhage. Biopsied right middle lobe nodule, better seen on 01/08/2023. 2. No definite pneumothorax although view is apical lordotic which could obscure a tiny pneumothorax. 3. Known right apical pleural mass, better seen on 01/08/2023. Electronically Signed   By: Leanna Battles M.D.   On: 01/22/2023 09:22   DG C-ARM BRONCHOSCOPY  Result Date: 01/22/2023 C-ARM BRONCHOSCOPY: Fluoroscopy was utilized by the requesting physician.  No radiographic interpretation.   CT Super D Chest Wo Contrast  Result Date: 01/08/2023 CLINICAL DATA:  Pulmonary nodule. EXAM: CT CHEST WITHOUT CONTRAST TECHNIQUE: Multidetector CT imaging of the chest was performed using thin slice collimation for electromagnetic bronchoscopy planning purposes, without intravenous contrast. RADIATION DOSE REDUCTION: This exam was performed according to the departmental dose-optimization program which includes automated exposure control, adjustment of the mA and/or kV according to patient size and/or use of iterative reconstruction technique. COMPARISON:  PET 11/23/2022, CT chest 10/13/2022. FINDINGS: Cardiovascular: Atherosclerotic calcification of the aorta and coronary arteries. Heart is at the upper limits of normal in size. No pericardial effusion. Mediastinum/Nodes: No pathologically enlarged mediastinal or axillary lymph nodes. Hilar regions are difficult  to definitively evaluate without IV contrast. There may be slight distal esophageal wall thickening which can be seen with gastroesophageal reflux disease. Lungs/Pleura: Severe bullous paraseptal and centrilobular emphysema. Spiculated nodule in the lateral segment right middle lobe measures 1.2 x 2.7 cm (4/104), as on 10/13/2022. Extrapleural oblong soft tissue mass in the anterolateral upper right hemithorax measures 1.1 x 6.6 cm (3/56), as on 10/13/2022. Subpleural nodule along the medial upper left mediastinum measures 1.0 x 1.8 cm (3/54), also stable. Scattered mucoid impaction.  Postsurgical scarring along the left major fissure. No pleural fluid. Airway is unremarkable. Upper Abdomen: Minimal nodular thickening of the adrenal glands. No specific follow-up necessary. Small hiatal hernia. Visualized portions of the liver, gallbladder, adrenal glands, kidneys, spleen, pancreas, stomach and bowel are otherwise grossly unremarkable. No upper abdominal adenopathy. Musculoskeletal: Degenerative changes in the spine. IMPRESSION: 1. Spiculated right middle lobe nodule, hypermetabolic on recent PET and compatible with primary bronchogenic carcinoma. 2. Extrapleural/pleural soft tissue lesions in the upper hemithoraces, not hypermetabolic on PET and considered benign. 3. Aortic atherosclerosis (ICD10-I70.0). Coronary artery calcification. 4.  Emphysema (ICD10-J43.9). Electronically Signed   By: Leanna Battles M.D.   On: 01/08/2023 12:10     Past medical hx Past Medical History:  Diagnosis Date   Arthritis    Bullous emphysema (HCC)    Severe Bilateral   Cocaine abuse (HCC)    Colitis    with bleeding   COPD (chronic obstructive pulmonary disease) (HCC)    Coronary artery disease    Dyspnea    GERD (gastroesophageal reflux disease)    Headache    sinusitis   Hypertension    LBP (low back pain)    now chronic   Lumbago    Lung mass    Right Upper Lobe pleural based mass, negative  on PET Scan   Myocardial infarction [facility])    Neck pain    nerve pain    Neuromuscular disorder (HCC)    Neuropathy    right legf, calf, foot   Pneumonia    hx   S/P angioplasty with stent 01/13/20 emergent DES to mLAD  01/15/2020   Shortness of breath 03/03/2020   Spine pain    nerve   Therapeutic drug monitoring    Tobacco abuse      Social History   Tobacco Use   Smoking status: Every Day    Current packs/day: 0.25    Average packs/day: 0.3 packs/day for 45.0 years (11.3 ttl pk-yrs)    Types: Cigarettes    Passive exposure: Current   Smokeless tobacco: Never   Tobacco  comments:    Pt states that he smoke 5 ciggs a day as of 03/08/2022 LW    Pt chewing nicorette gum as of  03/08/2022 LW    04/22/20 Patient given 1-800-quit-now  and the link to [facility] health virtual smoking cessation link        Pt is smoking 10 ciggs daily as of 02/03/2022 LW  Vaping Use   Vaping status: Never Used  Substance Use Topics   Alcohol use: Not Currently    Comment: scotch once a week   Drug use: Yes    Types: Cocaine, Marijuana    Comment: crack cocaine quit 2003    Mr.Weatherford reports that he has been smoking cigarettes. He has a 11.3 pack-year smoking history. He has been exposed to tobacco smoke. He has never used smokeless tobacco. He reports that he does not currently use alcohol. He reports current drug use.  Drugs: Cocaine and Marijuana.  Tobacco Cessation: Ready to quit: Not Answered Counseling given: Not Answered Tobacco comments: Pt states that he smoke 5 ciggs a day as of 03/08/2022 LW Pt chewing nicorette gum as of  03/08/2022 LW 04/22/20 Patient given 1-800-quit-now  and the link to [facility] health virtual smoking cessation link  Pt is smoking 10 ciggs daily as of 02/03/2022 LW Pt. Is actively working on quitting, but he is still smoking  Past surgical hx, Family hx, Social hx all reviewed.  Current Outpatient Medications on File Prior to Visit  Medication Sig   acetaminophen (TYLENOL) 325 MG tablet Take 2 tablets (650 mg total) by mouth every 4 (four) hours as needed for headache or mild pain.   albuterol (PROVENTIL) (2.5 MG/3ML) 0.083% nebulizer solution Take 3 mLs (2.5 mg total) by nebulization every 6 (six) hours as needed for wheezing or shortness of breath.   aspirin 81 MG chewable tablet Chew 1 tablet (81 mg total) by mouth daily.   atorvastatin (LIPITOR) 80 MG tablet Take 1 tablet (80 mg total) by mouth daily.   diclofenac Sodium (VOLTAREN) 1 % GEL Apply 2 g topically 2 (two) times daily as needed (joint pain).   famotidine (PEPCID) 40 MG tablet Take 1  tablet (40 mg total) by mouth 2 (two) times daily.   HYDROcodone-acetaminophen (NORCO) 5-325 MG tablet Take 1 tablet by mouth every 6 (six) hours as needed.   ipratropium-albuterol (DUONEB) 0.5-2.5 (3) MG/3ML SOLN Take 3 mLs by nebulization as needed.   metoprolol succinate (TOPROL XL) 25 MG 24 hr tablet Take 1/2 tablet (12.5 mg total) by mouth daily.   montelukast (SINGULAIR) 10 MG tablet Take 1 tablet (10 mg total) by mouth every evening. (Patient taking differently: Take 10 mg by mouth daily as needed (Asthma).)   nicotine (NICOTROL) 10 MG inhaler Inhale 1 Cartridge (1 continuous puffing total) into the lungs as needed for smoking cessation.   nicotine polacrilex (NICORETTE) 2 MG lozenge Use one lozenge every 2-3 hours as needed   nitroGLYCERIN (NITROSTAT) 0.4 MG SL tablet Place 1 tablet (0.4 mg total) under the tongue every 5 (five) minutes as needed for chest pain.   pantoprazole (PROTONIX) 40 MG tablet Take 1 tablet (40 mg total) by mouth daily. STOP OMEPRAZOLE   pregabalin (LYRICA) 50 MG capsule Take 1 capsule (50 mg total) by mouth 2 (two) times daily as needed.   Respiratory Therapy Supplies (FLUTTER) DEVI Use as directed   tamsulosin (FLOMAX) 0.4 MG CAPS capsule Take 2 capsules (0.8 mg total) by mouth at bedtime. Pt needs follow up for additional refills (Patient taking differently: Take 0.4 mg by mouth at bedtime.)   albuterol (VENTOLIN HFA) 108 (90 Base) MCG/ACT inhaler INHALE 2 PUFFS INTO THE LUNGS EVERY 6 (SIX) HOURS AS NEEDED FOR WHEEZING OR SHORTNESS OF BREATH.   [DISCONTINUED] omeprazole (PRILOSEC) 40 MG capsule TAKE 1 CAPSULE BY MOUTH EVERY DAY BEFORE A MEAL   No current facility-administered medications on file prior to visit.     Allergies  Allergen Reactions   Penicillins Anaphylaxis and Hives    Review Of Systems:  Constitutional:   No  weight loss, night sweats,  Fevers, chills, fatigue, or  lassitude.  HEENT:   No headaches,  Difficulty swallowing,  Tooth/dental  problems, or  Sore throat,                No sneezing, itching, ear ache, nasal congestion, post nasal drip,   CV:  No chest pain,  Orthopnea, PND, swelling in lower extremities, anasarca, dizziness, palpitations, syncope.   GI  No heartburn, indigestion, abdominal pain, nausea, vomiting, diarrhea, change in bowel habits, loss of appetite, bloody stools.   Resp: No shortness of breath with exertion or at rest.  No excess mucus, no productive cough,  No non-productive cough,  No coughing up of blood.  No change in color of mucus.  No wheezing.  No chest wall deformity  Skin: no rash or lesions.  GU: no dysuria, change in color of urine, no urgency or frequency.  No flank pain, no hematuria   MS:  No joint pain or swelling.  +  decreased range of motion, that has returned to baseline .  + back pain.  Psych:  No change in mood or affect. No depression or anxiety.  No memory loss.   Vital Signs BP 130/80 (BP Location: Left Arm, Cuff Size: Normal)   Pulse 89   Ht 6' (1.829 m)   Wt 171 lb 3.2 oz (77.7 kg)   SpO2 97%   BMI 23.22 kg/m    Physical Exam:  General- No distress,  A&Ox3, pleasant ENT: No sinus tenderness, TM clear, pale nasal mucosa, no oral exudate,no post nasal drip, no LAN Cardiac: S1, S2, regular rate and rhythm, no murmur Chest: No wheeze/ rales/ dullness; no accessory muscle use, no nasal flaring, no sternal retractions, diminished per bases Abd.: Soft Non-tender, ND, BS +, Body mass index is 23.22 kg/m.  Ext: No clubbing cyanosis, edema Neuro:  normal strength. 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 I have referred you back to Dr. Cliffton Asters to be evaluated for surgery. PFT's have been done.  This is an urgent  referral , so you should get a call to get this scheduled. Dr. Cliffton Asters and his team will take great care of you.  Please work on quitting smoking.  You can receive free nicotine replacement therapy (patches, gum, or mints) by calling 1-800-QUIT NOW. Please call so we can get you on the path to becoming a non-smoker. I know it is hard, but you can do this!  Hypnosis for smoking cessation  [employer] [phone]  Acupuncture for smoking cessation  [employer] [phone]   Please call us if you need [place].  Please contact office for sooner follow up if symptoms do not improve or worsen or seek emergency care    Weakness of lower extremities, post bronchoscopic  procedure ED Visit and Neuro Consult 01/24/2023>> patient refused LP recommended. Cranial nerves II to XII were  intact. MR imaging showed  cervical and lumbar degenerative disease Treated with Steroids, symptoms resolved Pt. Has returned to baseline neurological status  Plan Follow up with neurology and neurosurgery as outpatient if not resolved per EDP recommendations    I spent 35 minutes dedicated to the care of this patient on the date of this encounter to include pre-visit review of records, face-to-face time with the patient discussing conditions above, post visit ordering of testing, clinical documentation with the electronic health record, making appropriate referrals as documented, and communicating necessary information to the patient's healthcare team.   Bevelyn Ngo, NP 01/31/2023  2:00 PM

## 2023-01-31 ENCOUNTER — Other Ambulatory Visit (HOSPITAL_COMMUNITY): Payer: Self-pay

## 2023-01-31 ENCOUNTER — Other Ambulatory Visit: Payer: Self-pay

## 2023-02-01 ENCOUNTER — Other Ambulatory Visit: Payer: Self-pay

## 2023-02-02 ENCOUNTER — Other Ambulatory Visit: Payer: Self-pay

## 2023-02-02 ENCOUNTER — Other Ambulatory Visit (HOSPITAL_COMMUNITY): Payer: Self-pay

## 2023-02-02 NOTE — Progress Notes (Signed)
The proposed treatment discussed in conference is for discussion purpose only and is not a binding recommendation.  The patients have not been physically examined, or presented with their treatment options.  Therefore, final treatment plans cannot be decided.  

## 2023-02-07 ENCOUNTER — Encounter: Payer: Self-pay | Admitting: Internal Medicine

## 2023-02-07 ENCOUNTER — Ambulatory Visit: Payer: Commercial Managed Care - PPO | Admitting: Internal Medicine

## 2023-02-07 VITALS — BP 110/84 | HR 97 | Temp 97.5°F | Ht 72.0 in | Wt 169.2 lb

## 2023-02-07 DIAGNOSIS — F17210 Nicotine dependence, cigarettes, uncomplicated: Secondary | ICD-10-CM

## 2023-02-07 DIAGNOSIS — R634 Abnormal weight loss: Secondary | ICD-10-CM

## 2023-02-07 DIAGNOSIS — I119 Hypertensive heart disease without heart failure: Secondary | ICD-10-CM | POA: Diagnosis not present

## 2023-02-07 DIAGNOSIS — R051 Acute cough: Secondary | ICD-10-CM

## 2023-02-07 DIAGNOSIS — I700 Atherosclerosis of aorta: Secondary | ICD-10-CM | POA: Diagnosis not present

## 2023-02-07 DIAGNOSIS — J4489 Other specified chronic obstructive pulmonary disease: Secondary | ICD-10-CM | POA: Diagnosis not present

## 2023-02-07 DIAGNOSIS — R7309 Other abnormal glucose: Secondary | ICD-10-CM

## 2023-02-07 LAB — HEMOGLOBIN A1C
Est. average glucose Bld gHb Est-mCnc: 137 mg/dL
Hgb A1c MFr Bld: 6.4 % — ABNORMAL HIGH (ref 4.8–5.6)

## 2023-02-07 NOTE — Assessment & Plan Note (Signed)
Stable. He will continue with Breztri to use 2 puffs twice daily.

## 2023-02-07 NOTE — Assessment & Plan Note (Signed)
He is now smoking five cigarettes per day.  He is also using nicotine lozenges.  Encouraged to continue efforts to quit smoking.

## 2023-02-07 NOTE — Patient Instructions (Signed)
Hypertension, Adult Hypertension is another name for high blood pressure. High blood pressure forces your heart to work harder to pump blood. This can cause problems over time. There are two numbers in a blood pressure reading. There is a top number (systolic) over a bottom number (diastolic). It is best to have a blood pressure that is below 120/80. What are the causes? The cause of this condition is not known. Some other conditions can lead to high blood pressure. What increases the risk? Some lifestyle factors can make you more likely to develop high blood pressure: Smoking. Not getting enough exercise or physical activity. Being overweight. Having too much fat, sugar, calories, or salt (sodium) in your diet. Drinking too much alcohol. Other risk factors include: Having any of these conditions: Heart disease. Diabetes. High cholesterol. Kidney disease. Obstructive sleep apnea. Having a family history of high blood pressure and high cholesterol. Age. The risk increases with age. Stress. What are the signs or symptoms? High blood pressure may not cause symptoms. Very high blood pressure (hypertensive crisis) may cause: Headache. Fast or uneven heartbeats (palpitations). Shortness of breath. Nosebleed. Vomiting or feeling like you may vomit (nauseous). Changes in how you see. Very bad chest pain. Feeling dizzy. Seizures. How is this treated? This condition is treated by making healthy lifestyle changes, such as: Eating healthy foods. Exercising more. Drinking less alcohol. Your doctor may prescribe medicine if lifestyle changes do not help enough and if: Your top number is above 130. Your bottom number is above 80. Your personal target blood pressure may vary. Follow these instructions at home: Eating and drinking  If told, follow the DASH eating plan. To follow this plan: Fill one half of your plate at each meal with fruits and vegetables. Fill one fourth of your plate  at each meal with whole grains. Whole grains include whole-wheat pasta, brown rice, and whole-grain bread. Eat or drink low-fat dairy products, such as skim milk or low-fat yogurt. Fill one fourth of your plate at each meal with low-fat (lean) proteins. Low-fat proteins include fish, chicken without skin, eggs, beans, and tofu. Avoid fatty meat, cured and processed meat, or chicken with skin. Avoid pre-made or processed food. Limit the amount of salt in your diet to less than 1,500 mg each day. Do not drink alcohol if: Your doctor tells you not to drink. You are pregnant, may be pregnant, or are planning to become pregnant. If you drink alcohol: Limit how much you have to: 0-1 drink a day for women. 0-2 drinks a day for men. Know how much alcohol is in your drink. In the [place] one drink equals one 12 oz bottle of beer (355 mL), one 5 oz glass of wine (148 mL), or one 1 oz glass of hard liquor (44 mL). Lifestyle  Work with your doctor to stay at a healthy weight or to lose weight. Ask your doctor what the best weight is for you. Get at least 30 minutes of exercise that causes your heart to beat faster (aerobic exercise) most days of the week. This may include walking, swimming, or biking. Get at least 30 minutes of exercise that strengthens your muscles (resistance exercise) at least 3 days a week. This may include lifting weights or doing Pilates. Do not smoke or use any products that contain nicotine or tobacco. If you need help quitting, ask your doctor. Check your blood pressure at home as told by your doctor. Keep all follow-up visits. Medicines Take over-the-counter and prescription medicines   only as told by your doctor. Follow directions carefully. Do not skip doses of blood pressure medicine. The medicine does not work as well if you skip doses. Skipping doses also puts you at risk for problems. Ask your doctor about side effects or reactions to medicines that you should watch  for. Contact a doctor if: You think you are having a reaction to the medicine you are taking. You have headaches that keep coming back. You feel dizzy. You have swelling in your ankles. You have trouble with your vision. Get help right away if: You get a very bad headache. You start to feel mixed up (confused). You feel weak or numb. You feel faint. You have very bad pain in your: Chest. Belly (abdomen). You vomit more than once. You have trouble breathing. These symptoms may be an emergency. Get help right away. Call 911. Do not wait to see if the symptoms will go away. Do not drive yourself to the hospital. Summary Hypertension is another name for high blood pressure. High blood pressure forces your heart to work harder to pump blood. For most people, a normal blood pressure is less than 120/80. Making healthy choices can help lower blood pressure. If your blood pressure does not get lower with healthy choices, you may need to take medicine. This information is not intended to replace advice given to you by your health care provider. Make sure you discuss any questions you have with your health care provider. Document Revised: 12/02/2020 Document Reviewed: 12/02/2020 Elsevier Patient Education  2024 [employer] Inc.  

## 2023-02-07 NOTE — Assessment & Plan Note (Signed)
Chronic, LDL goal < 70.  He will c/w ASA 81mg  daily, along with atorvastatin 80mg  daily. He is encouraged to follow a heart healthy lifestyle.

## 2023-02-07 NOTE — Progress Notes (Signed)
I,Victoria T Deloria Lair, CMA,acting as a Neurosurgeon for [name], MD.,have documented all relevant documentation on the behalf of [name], MD,as directed by  [name], MD while in the presence of [name], MD.  Subjective:  [name] , male    DOB: 1951/02/01 , 72 y.o.   MRN: [id number]  Chief Complaint  Patient presents with   Hypertension    HPI  Patient presents today for a bp check, patient admits compliance with medications.  He c/o recent non-productive cough and body aches.  He states this started a week ago. Denies fever & headache. He admits his wife came down with something recently as well. He believes he caught these mild symptoms from her. At home he has used Coricidin without relief of his symptoms. He has also used Tamiflu from a previous rx, this has improved his sx greatly. He is convinced he had the flu.  Hypertension This is a chronic problem. The current episode started more than 1 year ago. The problem has been gradually improving since onset. The problem is controlled. Pertinent negatives include no blurred vision, chest pain, palpitations or shortness of breath. Risk factors for coronary artery disease include smoking/tobacco exposure and sedentary lifestyle. Past treatments include angiotensin blockers. The current treatment provides moderate improvement.     Past Medical History:  Diagnosis Date   Arthritis    Bullous emphysema (HCC)    Severe Bilateral   Cocaine abuse (HCC)    Colitis    with bleeding   COPD (chronic obstructive pulmonary disease) (HCC)    Coronary artery disease    Dyspnea    GERD (gastroesophageal reflux disease)    Headache    sinusitis   Hypertension    LBP (low back pain)    now chronic   Lumbago    Lung mass    Right Upper Lobe pleural based mass, negative  on PET Scan   Myocardial infarction [facility])    Neck pain    nerve pain    Neuromuscular disorder (HCC)    Neuropathy    right  legf, calf, foot   Pneumonia    hx   S/P angioplasty with stent 01/13/20 emergent DES to mLAD  01/15/2020   Shortness of breath 03/03/2020   Spine pain    nerve   Therapeutic drug monitoring    Tobacco abuse      Family History  Adopted: Yes  Problem Relation Age of Onset   Cancer Father    Lung cancer Father    Heart failure Sister    Breast cancer Sister    Emphysema Mother      Current Outpatient Medications:    acetaminophen (TYLENOL) 325 MG tablet, Take 2 tablets (650 mg total) by mouth every 4 (four) hours as needed for headache or mild pain., Disp: , Rfl:    albuterol (PROVENTIL) (2.5 MG/3ML) 0.083% nebulizer solution, Take 3 mLs (2.5 mg total) by nebulization every 6 (six) hours as needed for wheezing or shortness of breath., Disp: 75 mL, Rfl: 2   aspirin 81 MG chewable tablet, Chew 1 tablet (81 mg total) by mouth daily., Disp: , Rfl:    atorvastatin (LIPITOR) 80 MG tablet, Take 1 tablet (80 mg total) by mouth daily., Disp: 90 tablet, Rfl: 2   Budeson-Glycopyrrol-Formoterol (BREZTRI AEROSPHERE) 160-9-4.8 MCG/ACT AERO, Inhale 2 puffs into the lungs 2 (two) times daily., Disp: 10.7 g, Rfl: 2   diclofenac Sodium (VOLTAREN) 1 % GEL,  Apply 2 g topically 2 (two) times daily as needed (joint pain)., Disp: 100 g, Rfl: 2   famotidine (PEPCID) 40 MG tablet, Take 1 tablet (40 mg total) by mouth 2 (two) times daily., Disp: 60 tablet, Rfl: 12   metoprolol succinate (TOPROL XL) 25 MG 24 hr tablet, Take 1/2 tablet (12.5 mg total) by mouth daily., Disp: 90 tablet, Rfl: 2   montelukast (SINGULAIR) 10 MG tablet, Take 1 tablet (10 mg total) by mouth every evening. (Patient taking differently: Take 10 mg by mouth daily as needed (Asthma).), Disp: 30 tablet, Rfl: 11   nicotine polacrilex (NICORETTE) 2 MG lozenge, Use one lozenge every 2-3 hours as needed, Disp: 72 tablet, Rfl: 0   nitroGLYCERIN (NITROSTAT) 0.4 MG SL tablet, Place 1 tablet (0.4 mg total) under the tongue every 5 (five) minutes as  needed for chest pain., Disp: 25 tablet, Rfl: 4   pantoprazole (PROTONIX) 40 MG tablet, Take 1 tablet (40 mg total) by mouth daily. STOP OMEPRAZOLE, Disp: 90 tablet, Rfl: 3   pregabalin (LYRICA) 50 MG capsule, Take 1 capsule (50 mg total) by mouth 2 (two) times daily as needed., Disp: 180 capsule, Rfl: 0   Respiratory Therapy Supplies (FLUTTER) DEVI, Use as directed, Disp: 1 each, Rfl: 0   tamsulosin (FLOMAX) 0.4 MG CAPS capsule, Take 2 capsules (0.8 mg total) by mouth at bedtime. Pt needs follow up for additional refills (Patient taking differently: Take 0.4 mg by mouth at bedtime.), Disp: 90 capsule, Rfl: 2   valsartan (DIOVAN) 160 MG tablet, Take 1 tablet (160 mg total) by mouth daily., Disp: 90 tablet, Rfl: 2   albuterol (VENTOLIN HFA) 108 (90 Base) MCG/ACT inhaler, INHALE 2 PUFFS INTO THE LUNGS EVERY 6 (SIX) HOURS AS NEEDED FOR WHEEZING OR SHORTNESS OF BREATH., Disp: 8.5 g, Rfl: 2   Allergies  Allergen Reactions   Penicillins Anaphylaxis and Hives     Review of Systems  Constitutional: Negative.   HENT: Negative.    Eyes:  Negative for blurred vision.  Respiratory: Negative.  Negative for shortness of breath.   Cardiovascular: Negative.  Negative for chest pain and palpitations.  Gastrointestinal: Negative.   Skin: Negative.   Allergic/Immunologic: Negative.   Neurological: Negative.   Hematological: Negative.      Today's Vitals   02/07/23 0850  BP: 110/84  Pulse: 97  Temp: (!) 97.5 F (36.4 C)  SpO2: 98%  Weight: 169 lb 3.2 oz (76.7 kg)  Height: 6' (1.829 m)   Body mass index is 22.95 kg/m.  Wt Readings from Last 3 Encounters:  02/16/23 172 lb 12.8 oz (78.4 kg)  02/07/23 169 lb 3.2 oz (76.7 kg)  01/30/23 171 lb 3.2 oz (77.7 kg)     Objective:  Physical Exam Vitals and nursing note reviewed.  Constitutional:      Appearance: Normal appearance.  HENT:     Head: Normocephalic and atraumatic.  Eyes:     Extraocular Movements: Extraocular movements intact.   Cardiovascular:     Rate and Rhythm: Normal rate and regular rhythm.     Heart sounds: Normal heart sounds.  Pulmonary:     Effort: Pulmonary effort is normal.     Breath sounds: Normal breath sounds.     Comments: Few, scattered rhonchi. Musculoskeletal:     Cervical back: Normal range of motion.  Skin:    General: Skin is warm.  Neurological:     General: No focal deficit present.     Mental Status: He is alert.  Psychiatric:        Mood and Affect: Mood normal.         Assessment And Plan:  Hypertensive heart disease without heart failure Assessment & Plan: Chronic, controlled.  He will continue with Toprol XL 25mg  daily and valsartan 160mg  daily. He is encouraged to follow a low sodium diet. He will f/u in four to six months for re-evaluation.    Aortic atherosclerosis (HCC) Assessment & Plan: Chronic, LDL goal < 70.  He will c/w ASA 81mg  daily, along with atorvastatin 80mg  daily. He is encouraged to follow a heart healthy lifestyle.    Acute cough Assessment & Plan: Sx appear to be resolving. He is encouraged to complete full course of Tamiflu since he started the medication. He is advised to avoid dairy products while having these sx.    Other abnormal glucose Assessment & Plan: Previous labs reviewed, his A1c has been elevated in the past. I will check an A1c today. Reminded to avoid refined sugars including sugary drinks/foods and processed meats including bacon, sausages and deli meats.    Orders: -     Hemoglobin A1c  COPD with chronic bronchitis (HCC) Assessment & Plan: Stable. He will continue with Breztri to use 2 puffs twice daily.    Cigarette smoker Assessment & Plan: He is now smoking five cigarettes per day.  He is also using nicotine lozenges.  Encouraged to continue efforts to quit smoking.       Return in 4 weeks (on 03/07/2023), or [place]-flu shot, for April 2025 dm check August 2025 - physical w/ RS.  Patient was given opportunity to ask  questions. Patient verbalized understanding of the plan and was able to repeat key elements of the plan. All questions were answered to their satisfaction.    I, [name], MD, have reviewed all documentation for this visit. The documentation on 02/07/23 for the exam, diagnosis, procedures, and orders are all accurate and complete.   IF YOU HAVE BEEN REFERRED TO A SPECIALIST, IT MAY TAKE 1-2 WEEKS TO SCHEDULE/PROCESS THE REFERRAL. IF YOU HAVE NOT HEARD FROM US/SPECIALIST IN TWO WEEKS, PLEASE GIVE [place] A CALL AT [phone].   THE PATIENT IS ENCOURAGED TO PRACTICE SOCIAL DISTANCING DUE TO THE COVID-19 PANDEMIC.

## 2023-02-09 ENCOUNTER — Ambulatory Visit: Payer: Commercial Managed Care - PPO | Admitting: Thoracic Surgery (Cardiothoracic Vascular Surgery)

## 2023-02-12 ENCOUNTER — Encounter: Payer: Self-pay | Admitting: Internal Medicine

## 2023-02-14 NOTE — Progress Notes (Signed)
      [address]       [place] [address]             [phone]        Peter Sims [facility] Medical Record #[id number] Date of Birth: 01-16-1951  Referring: Oretha Milch, MD Primary Care: Dorothyann Peng, MD Primary Cardiologist:Tiffany Duke Salvia, MD  Reason for visit:   follow-up  History of Present Illness:     72 year old male presents in follow-up after undergoing a navigational bronchoscopy of the right middle lobe squamous cell cancer.  He continues to smoke.  Physical Exam: BP 118/79 (BP Location: Left Arm, Patient Position: Sitting, Cuff Size: Normal)   Pulse 75   Resp 20   Ht 6' (1.829 m)   Wt 172 lb 12.8 oz (78.4 kg)   SpO2 98% Comment: RA  BMI 23.44 kg/m   Alert NAD Abdomen, ND No peripheral edema   Diagnostic Studies & Laboratory data:      Path:  FINAL MICROSCOPIC DIAGNOSIS:   A. LUNG, RML, FINE NEEDLE ASPIRATION  BIOPSIES:  - Squamous cell carcinoma  - See comment   B. LUNG, RML, BRUSHING:  - Squamous cell carcinoma  - See comment     Assessment / Plan:   72 yo male with right middle lobe NSCLC.  Descent PFTs, but severely emphysematous lungs.  He is hesitant to proceed with surgery now that we have discussed it in detail.  Biopsy has confirmed squamous cell cancer.  I referred him to radiation oncology to discuss further plans.  Given the timing of his diagnosis I think that is important to proceed with some type of treatment.  He still has been unable to quit smoking.  Given all of these factors I think that radiation therapy would help expedite his care.  [name] 02/16/2023 4:31 PM

## 2023-02-16 ENCOUNTER — Encounter: Payer: Self-pay | Admitting: Thoracic Surgery (Cardiothoracic Vascular Surgery)

## 2023-02-16 ENCOUNTER — Ambulatory Visit (INDEPENDENT_AMBULATORY_CARE_PROVIDER_SITE_OTHER): Payer: Commercial Managed Care - PPO | Admitting: Thoracic Surgery (Cardiothoracic Vascular Surgery)

## 2023-02-16 VITALS — BP 118/79 | HR 75 | Resp 20 | Ht 72.0 in | Wt 172.8 lb

## 2023-02-16 DIAGNOSIS — R911 Solitary pulmonary nodule: Secondary | ICD-10-CM | POA: Diagnosis not present

## 2023-02-17 DIAGNOSIS — R051 Acute cough: Secondary | ICD-10-CM | POA: Insufficient documentation

## 2023-02-17 NOTE — Assessment & Plan Note (Addendum)
Chronic, controlled.  He will continue with Toprol XL 25mg  daily and valsartan 160mg  daily. He is encouraged to follow a low sodium diet. He will f/u in four to six months for re-evaluation.

## 2023-02-17 NOTE — Assessment & Plan Note (Signed)
Sx appear to be resolving. He is encouraged to complete full course of Tamiflu since he started the medication. He is advised to avoid dairy products while having these sx.

## 2023-02-17 NOTE — Assessment & Plan Note (Signed)
Previous labs reviewed, his A1c has been elevated in the past. I will check an A1c today. Reminded to avoid refined sugars including sugary drinks/foods and processed meats including bacon, sausages and deli meats.     

## 2023-02-20 NOTE — Progress Notes (Signed)
Radiation Oncology         [phone] ________________________________  Initial Outpatient Consultation  Name: Peter Sims MRN: [id number]  Date: 02/22/2023  DOB: 03-11-1950  WU:[id number], Peter Schools, MD  [name], MD   REFERRING PHYSICIAN: [name], MD  DIAGNOSIS: No diagnosis found.   Cancer Staging  No matching staging information was found for the patient.  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 male current smoker who participates in lung cancer screening chest CT's. He was also previously established with Dr. Vassie Loll at [facility] pulmonary for COPD and given his smoking history.   He presented for a lung cancer screening CT this past August (10/13/22) which revealed a new 16.2 mm posterior RML pulmonary nodule concerning for primary bronchogenic carcinoma (Lung-RADS 4B, suspicious).   Based on the results of his screening CT, Dr. Vassie Loll recommended that he proceed with a PET scan, which was performed on 11/23/22, and redemonstrated the 2.4 cm RML pulmonary nodule compatible with primary bronchogenic carcinoma. PET otherwise showed no evidence of hypermetabolic thoracic lymphadenopathy or evidence of distant metastatic disease.   He was accordingly seen in consultation by Dr. Cliffton Asters on 12/25/22 to discuss surgical treatment options. Although his PFT's were deemed acceptable for surgery, Dr. Cliffton Asters noted concern that his severe emphysema may lead to complications.   To complete his staging work-up, he presented for a super-D chest CT on 01/08/23 which redemonstrated the hypermetabolic RML nodule. Other notable findings included a likely benign extrapleural/pleural soft tissue lesions in the upper hemithoraces, and evidence of emphysema.    He was however cleared to proceed with a bronchoscopy for tissue sampling on  01/22/23 under the care of Dr. Delton Coombes.  FNA, lavage, and brushings of the RML collected at that time all showed findings consistent with squamous cell carcinoma.   Several days after his bronchoscopy, the patient presented to the ED on 01/24/23 with c/o LE weakness and pain which began several hours after his bronchoscopy. He also endorsed pain in his wrists and arms but primarily in his legs, as well as chest and abdominal pain. Although his discharge diagnosis was unspecified spinal stenosis, no definite etiology was concluded and he was discharged with a course of hydrocodone for pain control.   Imaging performed while in the ED includes:  -- MRI of the cervical and thoracic spine performed both showed no evidence of metastatic disease and evidence of cervical and thoracic foraminal stenosis.  -- CT AP showed no acute findings or evidence of metastatic disease in the abdomen or pelvis. The RML pulmonary nodule was also partially visualized and appeared stable.   Given the extended interval of time since his initial work-up, he was re-referred to Dr. Cliffton Asters on 02/16/23. Based on his ongoing smoking use, and severely emphysematous lungs (as previously noted), Dr. Cliffton Asters has recommended that he proceed with radiation therapy to help expedite his care.   PREVIOUS RADIATION THERAPY: No  PAST MEDICAL HISTORY:  has a past medical history of Arthritis, Bullous emphysema (HCC), Cocaine abuse (HCC), Colitis, COPD (chronic obstructive pulmonary disease) (HCC), Coronary artery disease, Dyspnea, GERD (gastroesophageal reflux disease), Headache, Hypertension, LBP (low back pain), Lumbago, Lung mass, Myocardial infarction [facility]), Neck pain, Neuromuscular disorder (HCC), Neuropathy, Pneumonia, S/P angioplasty with stent 01/13/20 emergent DES to mLAD  (01/15/2020), Shortness of breath (03/03/2020), Spine pain, Therapeutic drug monitoring, and Tobacco  abuse.    PAST SURGICAL HISTORY: Past Surgical History:   Procedure Laterality Date   BRONCHIAL BIOPSY  01/22/2023   Procedure: BRONCHIAL BIOPSIES;  Surgeon: Leslye Peer, MD;  Location: [facility] ENDOSCOPY;  Service: Pulmonary;;   BRONCHIAL BRUSHINGS  01/22/2023   Procedure: BRONCHIAL BRUSHINGS;  Surgeon: Leslye Peer, MD;  Location: [facility] ENDOSCOPY;  Service: Pulmonary;;   BRONCHIAL NEEDLE ASPIRATION BIOPSY  01/22/2023   Procedure: BRONCHIAL NEEDLE ASPIRATION BIOPSIES;  Surgeon: Leslye Peer, MD;  Location: [facility] ENDOSCOPY;  Service: Pulmonary;;   BRONCHIAL WASHINGS  01/22/2023   Procedure: BRONCHIAL WASHINGS;  Surgeon: Leslye Peer, MD;  Location: [facility] ENDOSCOPY;  Service: Pulmonary;;   CARDIAC CATHETERIZATION     COLONOSCOPY     CORONARY/GRAFT ACUTE MI REVASCULARIZATION N/A 01/14/2020   Procedure: Coronary/Graft Acute MI Revascularization;  Surgeon: Tonny Bollman, MD;  Location: [facility] INVASIVE CV LAB;  Service: Cardiovascular;  Laterality: N/A;   FIDUCIAL MARKER PLACEMENT  01/22/2023   Procedure: FIDUCIAL MARKER PLACEMENT;  Surgeon: Leslye Peer, MD;  Location: [facility] ENDOSCOPY;  Service: Pulmonary;;   LEFT HEART CATH AND CORONARY ANGIOGRAPHY N/A 01/14/2020   Procedure: LEFT HEART CATH AND CORONARY ANGIOGRAPHY;  Surgeon: Tonny Bollman, MD;  Location: [facility] INVASIVE CV LAB;  Service: Cardiovascular;  Laterality: N/A;   LEFT HEART CATH AND CORONARY ANGIOGRAPHY N/A 12/13/2020   Procedure: LEFT HEART CATH AND CORONARY ANGIOGRAPHY;  Surgeon: Tonny Bollman, MD;  Location: [facility] INVASIVE CV LAB;  Service: Cardiovascular;  Laterality: N/A;   LUMBAR LAMINECTOMY/DECOMPRESSION MICRODISCECTOMY Right 05/03/2016   Procedure: MICRODISCECTOMY LUMBAR FIVE- SACRAL ONE RIGHT;  Surgeon: Coletta Memos, MD;  Location: [facility] OR;  Service: Neurosurgery;  Laterality: Right;   NO PAST SURGERIES      FAMILY HISTORY: family history includes Breast cancer in his sister; Cancer in his father; Emphysema in his mother; Heart failure in his sister; Lung cancer in his father. He was  adopted.  SOCIAL HISTORY:  reports that he has been smoking cigarettes. He has a 11.3 pack-year smoking history. He has been exposed to tobacco smoke. He has never used smokeless tobacco. He reports that he does not currently use alcohol. He reports current drug use. Drugs: Cocaine and Marijuana.  ALLERGIES: Penicillins  MEDICATIONS:  Current Outpatient Medications  Medication Sig Dispense Refill   acetaminophen (TYLENOL) 325 MG tablet Take 2 tablets (650 mg total) by mouth every 4 (four) hours as needed for headache or mild pain.     albuterol (PROVENTIL) (2.5 MG/3ML) 0.083% nebulizer solution Take 3 mLs (2.5 mg total) by nebulization every 6 (six) hours as needed for wheezing or shortness of breath. 75 mL 2   albuterol (VENTOLIN HFA) 108 (90 Base) MCG/ACT inhaler INHALE 2 PUFFS INTO THE LUNGS EVERY 6 (SIX) HOURS AS NEEDED FOR WHEEZING OR SHORTNESS OF BREATH. 8.5 g 2   aspirin 81 MG chewable tablet Chew 1 tablet (81 mg total) by mouth daily.     atorvastatin (LIPITOR) 80 MG tablet Take 1 tablet (80 mg total) by mouth daily. 90 tablet 2   Budeson-Glycopyrrol-Formoterol (BREZTRI AEROSPHERE) 160-9-4.8 MCG/ACT AERO Inhale 2 puffs into the lungs 2 (two) times daily. 10.7 g 2   diclofenac Sodium (VOLTAREN) 1 % GEL Apply 2 g topically 2 (two) times daily as needed (joint pain). 100 g 2   famotidine (PEPCID) 40 MG tablet Take 1 tablet (40 mg total) by mouth 2 (two) times daily. 60 tablet 12   metoprolol succinate (TOPROL XL) 25 MG 24 hr tablet Take 1/2 tablet (  12.5 mg total) by mouth daily. 90 tablet 2   montelukast (SINGULAIR) 10 MG tablet Take 1 tablet (10 mg total) by mouth every evening. (Patient taking differently: Take 10 mg by mouth daily as needed (Asthma).) 30 tablet 11   nicotine polacrilex (NICORETTE) 2 MG lozenge Use one lozenge every 2-3 hours as needed 72 tablet 0   nitroGLYCERIN (NITROSTAT) 0.4 MG SL tablet Place 1 tablet (0.4 mg total) under the tongue every 5 (five) minutes as needed  for chest pain. 25 tablet 4   pantoprazole (PROTONIX) 40 MG tablet Take 1 tablet (40 mg total) by mouth daily. STOP OMEPRAZOLE 90 tablet 3   pregabalin (LYRICA) 50 MG capsule Take 1 capsule (50 mg total) by mouth 2 (two) times daily as needed. 180 capsule 0   Respiratory Therapy Supplies (FLUTTER) DEVI Use as directed 1 each 0   tamsulosin (FLOMAX) 0.4 MG CAPS capsule Take 2 capsules (0.8 mg total) by mouth at bedtime. Pt needs follow up for additional refills (Patient taking differently: Take 0.4 mg by mouth at bedtime.) 90 capsule 2   valsartan (DIOVAN) 160 MG tablet Take 1 tablet (160 mg total) by mouth daily. 90 tablet 2   No current facility-administered medications for this encounter.    REVIEW OF SYSTEMS:  Notable for that above.   PHYSICAL EXAM:  vitals were not taken for this visit.   General: Alert and oriented, in no acute distress *** HEENT: Head is normocephalic. Extraocular movements are intact. Oropharynx is clear. Neck: Neck is supple, no palpable cervical or supraclavicular lymphadenopathy. Heart: Regular in rate and rhythm with no murmurs, rubs, or gallops. Chest: Clear to auscultation bilaterally, with no rhonchi, wheezes, or rales. Abdomen: Soft, nontender, nondistended, with no rigidity or guarding. Extremities: No cyanosis or edema. Lymphatics: see Neck Exam Skin: No concerning lesions. Musculoskeletal: symmetric strength and muscle tone throughout. Neurologic: Cranial nerves II through XII are grossly intact. No obvious focalities. Speech is fluent. Coordination is intact. Psychiatric: Judgment and insight are intact. Affect is appropriate.   ECOG = ***  0 - Asymptomatic (Fully active, able to carry on all predisease activities without restriction)  1 - Symptomatic but completely ambulatory (Restricted in physically strenuous activity but ambulatory and able to carry out work of a light or sedentary nature. For example, light housework, office work)  2 -  Symptomatic, <50% in bed during the day (Ambulatory and capable of all self care but unable to carry out any work activities. Up and about more than 50% of waking hours)  3 - Symptomatic, >50% in bed, but not bedbound (Capable of only limited self-care, confined to bed or chair 50% or more of waking hours)  4 - Bedbound (Completely disabled. Cannot carry on any self-care. Totally confined to bed or chair)  5 - Death   Santiago Glad MM, Creech RH, Tormey DC, et al. [phone]). "Toxicity and response criteria of the [facility]". Am. Evlyn Clines. Oncol. 5 (6): 649-55   LABORATORY DATA:  Lab Results  Component Value Date   WBC 7.2 01/24/2023   HGB 16.0 01/24/2023   HCT 48.7 01/24/2023   MCV 88.1 01/24/2023   PLT 212 01/24/2023   CMP     Component Value Date/Time   NA 139 01/24/2023 0550   NA 142 08/03/2022 0954   K 4.2 01/24/2023 0550   CL 106 01/24/2023 0550   CO2 24 01/24/2023 0550   GLUCOSE 129 (H) 01/24/2023 0550   BUN 11 01/24/2023 0550  BUN 11 08/03/2022 0954   CREATININE 1.03 01/24/2023 0550   CALCIUM 9.9 01/24/2023 0550   PROT 7.1 01/24/2023 0748   PROT 7.2 08/03/2022 0954   ALBUMIN 3.6 01/24/2023 0748   ALBUMIN 4.3 08/03/2022 0954   AST 26 01/24/2023 0748   ALT 27 01/24/2023 0748   ALKPHOS 87 01/24/2023 0748   BILITOT 0.9 01/24/2023 0748   BILITOT 0.7 08/03/2022 0954   EGFR 71 08/03/2022 0954   GFRNONAA >60 01/24/2023 0550         RADIOGRAPHY: CT ABDOMEN PELVIS W CONTRAST Result Date: 01/24/2023 CLINICAL DATA:  Abdominal pain. Postop from bronchoscopy and lung biopsy. EXAM: CT ABDOMEN AND PELVIS WITH CONTRAST TECHNIQUE: Multidetector CT imaging of the abdomen and pelvis was performed using the standard protocol following bolus administration of intravenous contrast. RADIATION DOSE REDUCTION: This exam was performed according to the departmental dose-optimization program which includes automated exposure control, adjustment of the mA and/or kV according  to patient size and/or use of iterative reconstruction technique. CONTRAST:  75mL OMNIPAQUE IOHEXOL 350 MG/ML SOLN COMPARISON:  Chest CT on 01/08/2023, and AP CT on 09/11/2020 FINDINGS: Lower Chest: Spiculated nodule again seen in the right middle lobe measuring 2.5 x 1.2 cm. No pneumothorax or pleural fluid seen. Hepatobiliary: No suspicious hepatic masses identified. Gallbladder is unremarkable. No evidence of biliary ductal dilatation. Pancreas:  No mass or inflammatory changes. Spleen: Within normal limits in size and appearance. Adrenals/Urinary Tract: No suspicious masses identified. No evidence of ureteral calculi or hydronephrosis. Normal appearance of bladder. Stomach/Bowel: No evidence of obstruction, inflammatory process or abnormal fluid collections. Normal appendix visualized. Diffuse colonic diverticulosis is again seen, without signs of diverticulitis. Vascular/Lymphatic: No pathologically enlarged lymph nodes. No acute vascular findings. Reproductive:  No mass or other significant abnormality. Other:  None. Musculoskeletal:  No suspicious bone lesions identified. IMPRESSION: No acute findings within the abdomen or pelvis. Colonic diverticulosis, without radiographic evidence of diverticulitis. Stable spiculated pulmonary nodule in right middle lobe, suspicious for bronchogenic carcinoma. Electronically Signed   By: Danae Orleans M.D.   On: 01/24/2023 14:59   MR Lumbar Spine W Wo Contrast Result Date: 01/24/2023 CLINICAL DATA:  Myelopathy, acute, lumbar spine. Diffuse pain following bronchoscopy 2 days ago. Unable to walk. EXAM: MRI LUMBAR SPINE WITHOUT AND WITH CONTRAST TECHNIQUE: Multiplanar and multiecho pulse sequences of the lumbar spine were obtained without and with intravenous contrast. CONTRAST:  7mL GADAVIST GADOBUTROL 1 MMOL/ML IV SOLN COMPARISON:  MRI of the lumbar spine without and with contrast 11/08/2016 FINDINGS: Segmentation: 5 non rib-bearing lumbar type vertebral bodies are  present. The lowest fully formed vertebral body is L5. Alignment: No significant listhesis is present. Mild straightening of the normal lumbar lordosis is similar the prior exam. Rightward curvature is again centered at L4. Vertebrae: Type 2 Modic changes have progressed at L4-5 and L5-S1. Progressive type 1 edematous changes are present at L3-4. Vertebral body heights are normal. Marrow signal is otherwise within normal limits. Conus medullaris and cauda equina: Conus extends to the L1 level. Conus and cauda equina appear normal. Paraspinal and other soft tissues: Limited imaging the abdomen is unremarkable. There is no significant adenopathy. No solid organ lesions are present. Disc levels: T12-L1: Insert normal disc L2-3: A progressive broad-based disc protrusion is present. Progressive moderate facet hypertrophy and ligamentum flavum thickening results in crowding of the nerve roots centrally. Mild foraminal narrowing is now present bilaterally. L3-4: A progressive leftward disc protrusion and asymmetric left-sided facet hypertrophy is present. Mild left subarticular stenosis  is now present. Progressive moderate foraminal narrowing is present bilaterally, left greater than right. L4-5: A leftward disc protrusion is present. Moderate left subarticular and foraminal stenosis is similar the prior exam. Mild right foraminal narrowing is stable. L5-S1: Right laminectomy is noted with decompression of the posterior central canal. Mild right foraminal narrowing is stable. No pathologic enhancement is present. IMPRESSION: 1. No acute abnormality. Normal appearance of the distal spinal cord. 2. Progressive multilevel spondylosis of the lumbar spine as described. 3. Progressive crowding of the nerve roots centrally at L2-3 with mild foraminal narrowing bilaterally. 4. Progressive mild left subarticular stenosis and moderate foraminal narrowing bilaterally at L3-4, left greater than right. 5. Stable moderate left  subarticular and foraminal stenosis at L4-5. 6. Stable mild right foraminal narrowing at L4-5 and L5-S1. 7. Right laminectomy at L5-S1 with decompression of the posterior central canal. Electronically Signed   By: Marin Roberts M.D.   On: 01/24/2023 13:52   MR Cervical Spine W and Wo Contrast Result Date: 01/24/2023 CLINICAL DATA:  Myelopathy, acute, cervical spine. Diffuse pain following bronchoscopy 2 days ago. Right-sided lung cancer. EXAM: MRI CERVICAL SPINE WITHOUT AND WITH CONTRAST TECHNIQUE: Multiplanar and multiecho pulse sequences of the cervical spine, to include the craniocervical junction and cervicothoracic junction, were obtained without and with intravenous contrast. CONTRAST:  7mL GADAVIST GADOBUTROL 1 MMOL/ML IV SOLN COMPARISON:  MRI of the cervical spine without contrast 01/01/2020 FINDINGS: Alignment: Slight anterolisthesis at C2-3, C3-4 and C6-7 is similar the prior exams. Slight retrolisthesis is again noted at C5-6 and C6-7. Straightening of the normal cervical lordosis is stable. Vertebrae: Progressive type 1 edematous endplate marrow changes are present on the right at C4-5 and to a lesser extent at C3-4. Vertebral body heights are maintained. No acute fractures are present. Cord: Normal signal and morphology. Posterior Fossa, vertebral arteries, paraspinal tissues: Craniocervical junction is normal. Flow is present in the vertebral arteries bilaterally. The visualized intracranial contents are within normal limits. Disc levels: C2-3: A rightward disc osteophyte complex is present. Facet hypertrophy and spurring is present bilaterally. No significant stenosis or change is present. C3-4: A central disc protrusion has progressed. This contacts the ventral surface the cord without abnormal cord signal. Uncovertebral and facet hypertrophy is present bilaterally progressive moderate left and mild right foraminal stenosis is present. C4-5: A rightward disc osteophyte complex is present.  Asymmetric right-sided facet hypertrophy is present. Partial effacement of ventral CSF is present. Severe right and moderate left foraminal stenosis is present. C5-6: A broad-based disc osteophyte complex partially effaces the ventral CSF. Uncovertebral and facet disease leads to moderate foraminal narrowing bilaterally, left greater than right. This is stable. C6-7: A leftward disc osteophyte complex is present. Uncovertebral and facet hypertrophy is present. Moderate left foraminal stenosis is present. Severe left and mild right foraminal narrowing is present. C7-T1: Advanced facet hypertrophy is present. Uncovering of a broad-based disc protrusion is present. Endplate spurring is noted. Progressive moderate to severe central and bilateral foraminal stenosis is present. No pathologic enhancement is present. IMPRESSION: 1. No acute abnormality or focal cord signal abnormality. 2. No evidence for metastatic disease. 3. Progressive multilevel spondylosis of the cervical spine as described. 4. Progressive moderate to severe central and bilateral foraminal stenosis at C7-T1. 5. Progressive moderate left and mild right foraminal stenosis at C3-4. 6. Severe right and moderate left foraminal stenosis at C4-5. 7. Moderate foraminal narrowing bilaterally at C5-6, left greater than right. 8. Severe left and mild right foraminal stenosis at C6-7. Electronically Signed  By: Marin Roberts M.D.   On: 01/24/2023 13:44   DG HIP UNILAT WITH PELVIS 2-3 VIEWS RIGHT Result Date: 01/24/2023 CLINICAL DATA:  72 year old male with difficulty walking. Right hip pain. EXAM: DG HIP (WITH OR WITHOUT PELVIS) 2-3V RIGHT COMPARISON:  No priors. FINDINGS: AP view of the bony pelvis and AP and lateral views of the right hip demonstrate no acute displaced fracture of the bony pelvic ring or the visualized proximal right femur. Femoral head is located in the acetabulum. Joint space narrowing, subchondral sclerosis, subchondral cyst  formation and osteophyte formation are noted in the right hip joint, indicative of mild-to-moderate osteoarthritis. IMPRESSION: 1. No acute radiographic abnormality of the bony pelvis or the right hip. 2. Mild-to-moderate right hip joint osteoarthritis. Electronically Signed   By: Trudie Reed M.D.   On: 01/24/2023 06:47   DG Chest 2 View Result Date: 01/24/2023 CLINICAL DATA:  72 year old male with history of chest pain. Recent history of bronchoscopy. EXAM: CHEST - 2 VIEW COMPARISON:  Chest x-ray 01/22/2023. FINDINGS: Volumes are increased with emphysematous changes. Fiducial marker projecting over the right middle lobe related to recent biopsy. Ill-defined airspace disease surrounding the fiducial marker has decreased compared to the prior study, likely interval regression of previously noted post biopsy hemorrhage. No new acute consolidative airspace disease. No pleural effusions. No pneumothorax. No evidence of pulmonary edema. Heart size is normal. Upper mediastinal contours are within normal limits. Atherosclerotic calcifications are noted in the thoracic aorta. IMPRESSION: 1. Resolving post biopsy hemorrhage in the right middle lobe. No new acute findings are noted. 2. Emphysema. 3. Aortic atherosclerosis. Electronically Signed   By: Trudie Reed M.D.   On: 01/24/2023 06:46   DG HIP UNILAT WITH PELVIS 2-3 VIEWS LEFT Result Date: 01/24/2023 CLINICAL DATA:  72 year old male with history of pain in the left hip. EXAM: DG HIP (WITH OR WITHOUT PELVIS) 2-3V LEFT COMPARISON:  04/03/2017. FINDINGS: Two views of the left hip demonstrate no acute displaced fracture, subluxation or dislocation. There is joint space narrowing, subchondral sclerosis, subchondral cyst formation and osteophyte formation in the hip joint, indicative of osteoarthritis. IMPRESSION: 1. No acute radiographic abnormality of the left hip. 2. Mild-to-moderate left hip joint osteoarthritis. Electronically Signed   By: Trudie Reed M.D.   On: 01/24/2023 06:43   DG Chest Port 1 View Result Date: 01/22/2023 CLINICAL DATA:  Post bronchoscopy with biopsy. EXAM: PORTABLE CHEST 1 VIEW COMPARISON:  02/03/2022 and CT chest 01/08/2023. FINDINGS: Trachea is midline. Heart size is accentuated by AP apical lordotic technique. New fiducial marker in the right perihilar region with associated small area airspace opacification. No definite pneumothorax although apical lordotic view is somewhat challenging. Right apical pleural mass, as on 01/08/2023. IMPRESSION: 1. Post bronchoscopic biopsy with a fiducial marker in the right middle lobe and probable associated mild hemorrhage. Biopsied right middle lobe nodule, better seen on 01/08/2023. 2. No definite pneumothorax although view is apical lordotic which could obscure a tiny pneumothorax. 3. Known right apical pleural mass, better seen on 01/08/2023. Electronically Signed   By: Leanna Battles M.D.   On: 01/22/2023 09:22   DG C-ARM BRONCHOSCOPY Result Date: 01/22/2023 C-ARM BRONCHOSCOPY: Fluoroscopy was utilized by the requesting physician.  No radiographic interpretation.      IMPRESSION/PLAN:***    On date of service, in total, I spent *** minutes on this encounter. Patient was seen in person.   __________________________________________   Lonie Peak, MD  This document serves as a record of services personally performed  by Lonie Peak, MD. It was created on her behalf by Neena Rhymes, a trained medical scribe. The creation of this record is based on the scribe's personal observations and the provider's statements to them. This document has been checked and approved by the attending provider.

## 2023-02-22 ENCOUNTER — Encounter: Payer: Self-pay | Admitting: Radiation Oncology

## 2023-02-22 ENCOUNTER — Ambulatory Visit
Admission: RE | Admit: 2023-02-22 | Discharge: 2023-02-22 | Disposition: A | Discharge status: Home / Self Care | Payer: Commercial Managed Care - PPO | Source: Ambulatory Visit | Attending: Radiation Oncology | Admitting: Radiation Oncology

## 2023-02-22 VITALS — BP 122/73 | HR 100 | Resp 18 | Ht 72.0 in | Wt 167.1 lb

## 2023-02-22 DIAGNOSIS — Z79899 Other long term (current) drug therapy: Secondary | ICD-10-CM | POA: Diagnosis not present

## 2023-02-22 DIAGNOSIS — M5125 Other intervertebral disc displacement, thoracolumbar region: Secondary | ICD-10-CM | POA: Insufficient documentation

## 2023-02-22 DIAGNOSIS — I700 Atherosclerosis of aorta: Secondary | ICD-10-CM | POA: Insufficient documentation

## 2023-02-22 DIAGNOSIS — J432 Centrilobular emphysema: Secondary | ICD-10-CM | POA: Insufficient documentation

## 2023-02-22 DIAGNOSIS — Z7982 Long term (current) use of aspirin: Secondary | ICD-10-CM | POA: Insufficient documentation

## 2023-02-22 DIAGNOSIS — K219 Gastro-esophageal reflux disease without esophagitis: Secondary | ICD-10-CM | POA: Insufficient documentation

## 2023-02-22 DIAGNOSIS — Z801 Family history of malignant neoplasm of trachea, bronchus and lung: Secondary | ICD-10-CM | POA: Diagnosis not present

## 2023-02-22 DIAGNOSIS — M5126 Other intervertebral disc displacement, lumbar region: Secondary | ICD-10-CM | POA: Insufficient documentation

## 2023-02-22 DIAGNOSIS — M48061 Spinal stenosis, lumbar region without neurogenic claudication: Secondary | ICD-10-CM | POA: Diagnosis not present

## 2023-02-22 DIAGNOSIS — C342 Malignant neoplasm of middle lobe, bronchus or lung: Secondary | ICD-10-CM | POA: Insufficient documentation

## 2023-02-22 DIAGNOSIS — Z803 Family history of malignant neoplasm of breast: Secondary | ICD-10-CM | POA: Insufficient documentation

## 2023-02-22 DIAGNOSIS — K5730 Diverticulosis of large intestine without perforation or abscess without bleeding: Secondary | ICD-10-CM | POA: Diagnosis not present

## 2023-02-22 DIAGNOSIS — I10 Essential (primary) hypertension: Secondary | ICD-10-CM | POA: Diagnosis not present

## 2023-02-22 DIAGNOSIS — I2510 Atherosclerotic heart disease of native coronary artery without angina pectoris: Secondary | ICD-10-CM | POA: Insufficient documentation

## 2023-02-22 DIAGNOSIS — F17210 Nicotine dependence, cigarettes, uncomplicated: Secondary | ICD-10-CM | POA: Insufficient documentation

## 2023-02-22 DIAGNOSIS — J439 Emphysema, unspecified: Secondary | ICD-10-CM | POA: Diagnosis not present

## 2023-02-22 NOTE — Addendum Note (Signed)
Encounter addended by: Merrie Roof, RN on: 02/22/2023 11:19 AM  Actions taken: Charge Capture section accepted

## 2023-02-22 NOTE — Progress Notes (Signed)
Thoracic Location of Tumor / Histology:   Squamous cell carcinoma of middle lobe of right lung (HCC)      Biopsies of 01/22/23  FINAL MICROSCOPIC DIAGNOSIS:   A. LUNG, RML, FINE NEEDLE ASPIRATION  BIOPSIES:  - Squamous cell carcinoma  - See comment   B. LUNG, RML, BRUSHING:  - Squamous cell carcinoma  - See comment   COMMENT:        Past/Anticipated interventions by medical oncology, if any:    Tobacco/Marijuana/Snuff/ETOH use: yes States that he smokes 5-6 cigarettes a day .  Signs/Symptoms Weight changes, if any: 10 pounds since January. Respiratory complaints, if any: Denies  Hemoptysis, if any: no Pain issues, if any:  denies any pain in his chest  SAFETY ISSUES: Prior radiation? no Pacemaker/ICD? no  Possible current pregnancy?no Is the patient on methotrexate? no  Current Complaints / other details:   Vitals:   02/22/23 0845  BP: 122/73  Pulse: 100  Resp: 18  SpO2: 97%  Weight: 75.8 kg  Height: 6' (1.829 m)

## 2023-02-22 NOTE — Addendum Note (Signed)
Encounter addended by: Merrie Roof, RN on: 02/22/2023 1:14 PM  Actions taken: Clinical Note Signed

## 2023-02-22 NOTE — Progress Notes (Signed)
Rn left message for dietian to call Peter Sims concerning Ensure coupons . Notified patient that she was out the office this week and would return a call to him once she returns. Patient verbalized understanding.

## 2023-02-23 ENCOUNTER — Ambulatory Visit
Admission: RE | Admit: 2023-02-23 | Discharge: 2023-02-23 | Disposition: A | Discharge status: Home / Self Care | Payer: Commercial Managed Care - PPO | Source: Ambulatory Visit | Attending: Radiation Oncology | Admitting: Radiation Oncology

## 2023-02-23 DIAGNOSIS — F17210 Nicotine dependence, cigarettes, uncomplicated: Secondary | ICD-10-CM | POA: Diagnosis not present

## 2023-02-23 DIAGNOSIS — C342 Malignant neoplasm of middle lobe, bronchus or lung: Secondary | ICD-10-CM | POA: Insufficient documentation

## 2023-03-05 ENCOUNTER — Ambulatory Visit
Admission: RE | Admit: 2023-03-05 | Discharge: 2023-03-05 | Disposition: A | Discharge status: Home / Self Care | Payer: Commercial Managed Care - PPO | Source: Ambulatory Visit | Attending: Radiation Oncology | Admitting: Radiation Oncology

## 2023-03-05 ENCOUNTER — Encounter: Payer: Self-pay | Admitting: Nutrition

## 2023-03-05 ENCOUNTER — Ambulatory Visit: Payer: Commercial Managed Care - PPO | Admitting: Primary Care

## 2023-03-05 DIAGNOSIS — C342 Malignant neoplasm of middle lobe, bronchus or lung: Secondary | ICD-10-CM | POA: Insufficient documentation

## 2023-03-05 DIAGNOSIS — F17210 Nicotine dependence, cigarettes, uncomplicated: Secondary | ICD-10-CM | POA: Diagnosis not present

## 2023-03-05 NOTE — Progress Notes (Signed)
 I received telephone message that patient is requesting Ensure coupons.  Have provided coupons with patient's name to radiation oncology.  Patient is scheduled for SBRT on Wednesday, January 8.

## 2023-03-06 ENCOUNTER — Ambulatory Visit: Payer: Commercial Managed Care - PPO

## 2023-03-07 ENCOUNTER — Other Ambulatory Visit: Payer: Self-pay

## 2023-03-07 DIAGNOSIS — F17210 Nicotine dependence, cigarettes, uncomplicated: Secondary | ICD-10-CM | POA: Diagnosis not present

## 2023-03-07 DIAGNOSIS — C342 Malignant neoplasm of middle lobe, bronchus or lung: Secondary | ICD-10-CM | POA: Diagnosis not present

## 2023-03-07 LAB — RAD ONC ARIA SESSION SUMMARY
Course Elapsed Days: 0
Plan Fractions Treated to Date: 1
Plan Prescribed Dose Per Fraction: 18 Gy
Plan Total Fractions Prescribed: 3
Plan Total Prescribed Dose: 54 Gy
Reference Point Dosage Given to Date: 18 Gy
Reference Point Session Dosage Given: 18 Gy
Session Number: 1

## 2023-03-08 ENCOUNTER — Ambulatory Visit: Payer: Commercial Managed Care - PPO | Admitting: Radiation Oncology

## 2023-03-09 ENCOUNTER — Other Ambulatory Visit: Payer: Self-pay

## 2023-03-09 ENCOUNTER — Ambulatory Visit: Payer: Commercial Managed Care - PPO

## 2023-03-09 ENCOUNTER — Ambulatory Visit
Admission: RE | Admit: 2023-03-09 | Discharge: 2023-03-09 | Disposition: A | Discharge status: Home / Self Care | Payer: Commercial Managed Care - PPO | Source: Ambulatory Visit | Attending: Radiation Oncology | Admitting: Radiation Oncology

## 2023-03-09 DIAGNOSIS — C342 Malignant neoplasm of middle lobe, bronchus or lung: Secondary | ICD-10-CM | POA: Diagnosis not present

## 2023-03-09 DIAGNOSIS — F17210 Nicotine dependence, cigarettes, uncomplicated: Secondary | ICD-10-CM | POA: Diagnosis not present

## 2023-03-09 LAB — RAD ONC ARIA SESSION SUMMARY
Course Elapsed Days: 2
Plan Fractions Treated to Date: 2
Plan Prescribed Dose Per Fraction: 18 Gy
Plan Total Fractions Prescribed: 3
Plan Total Prescribed Dose: 54 Gy
Reference Point Dosage Given to Date: 36 Gy
Reference Point Session Dosage Given: 18 Gy
Session Number: 2

## 2023-03-09 NOTE — Progress Notes (Deleted)
 Patient presents today for flu shot. Denies any cold like symptoms.

## 2023-03-12 ENCOUNTER — Ambulatory Visit
Admission: RE | Admit: 2023-03-12 | Discharge: 2023-03-12 | Disposition: A | Discharge status: Home / Self Care | Payer: Commercial Managed Care - PPO | Source: Ambulatory Visit | Attending: Radiation Oncology | Admitting: Radiation Oncology

## 2023-03-12 ENCOUNTER — Other Ambulatory Visit: Payer: Self-pay

## 2023-03-12 ENCOUNTER — Ambulatory Visit: Payer: Commercial Managed Care - PPO

## 2023-03-12 DIAGNOSIS — F17210 Nicotine dependence, cigarettes, uncomplicated: Secondary | ICD-10-CM | POA: Diagnosis not present

## 2023-03-12 DIAGNOSIS — Z510 Encounter for antineoplastic radiation therapy: Secondary | ICD-10-CM | POA: Diagnosis not present

## 2023-03-12 DIAGNOSIS — C342 Malignant neoplasm of middle lobe, bronchus or lung: Secondary | ICD-10-CM | POA: Diagnosis not present

## 2023-03-12 LAB — RAD ONC ARIA SESSION SUMMARY
Course Elapsed Days: 5
Plan Fractions Treated to Date: 3
Plan Prescribed Dose Per Fraction: 18 Gy
Plan Total Fractions Prescribed: 3
Plan Total Prescribed Dose: 54 Gy
Reference Point Dosage Given to Date: 54 Gy
Reference Point Session Dosage Given: 18 Gy
Session Number: 3

## 2023-03-13 ENCOUNTER — Ambulatory Visit: Payer: Commercial Managed Care - PPO | Admitting: Radiation Oncology

## 2023-03-13 NOTE — Radiation Completion Notes (Signed)
 Patient Name: TAYM, TWIST MRN: [id number] Date of Birth: [birth year] Referring Physician: LINNIE RAYAS, M.D. Date of Service: 2023-03-13 Radiation Oncologist: Lauraine Golden, M.D. [facility] [place]                             RADIATION ONCOLOGY END OF TREATMENT NOTE     Diagnosis: C34.2 Malignant neoplasm of middle lobe, bronchus or lung Staging on 2023-02-22: Squamous cell carcinoma of middle lobe of right lung (HCC) T=cT1c, N=cN0, M=cM0 Intent: Curative     ==========DELIVERED PLANS==========  First Treatment Date: 2023-03-07 Last Treatment Date: 2023-03-12   Plan Name: Lung_R_SBRT Site: Lung, Right Technique: SBRT/SRT-IMRT Mode: Photon Dose Per Fraction: 18 Gy Prescribed Dose (Delivered / Prescribed): 54 Gy / 54 Gy Prescribed Fxs (Delivered / Prescribed): 3 / 3     ==========ON TREATMENT VISIT DATES========== 2023-03-07, 2023-03-09, 2023-03-12, 2023-03-12     ==========UPCOMING VISITS==========       ==========APPENDIX - ON TREATMENT VISIT NOTES==========   See weekly On Treatment Notes in Epic for details in the Media tab (listed as Progress notes on the On Treatment Visit Dates listed above).

## 2023-03-14 ENCOUNTER — Ambulatory Visit: Payer: Commercial Managed Care - PPO | Admitting: Radiation Oncology

## 2023-03-16 ENCOUNTER — Ambulatory Visit: Payer: Commercial Managed Care - PPO | Admitting: Radiation Oncology

## 2023-03-23 ENCOUNTER — Encounter: Payer: Self-pay | Admitting: Acute Care

## 2023-04-06 NOTE — Progress Notes (Incomplete)
[name] presents for a telephone follow up after completing SBRT to the right lung on 03/12/2023.  Patients is very pleasant and is doing well after completing his radiation treatments. He is trying to stop smoking.  He says he experiences a cold spot on his chest and some phantom itching.  PAIN: He is currently has no pain or irritation. Two weeks ago, patient had the flu and was on antibiotics and steroids. Two days ago he had fatigue and was sluggish.  Patient says he feels better now with the treatments.   RESPIRATORY: Patient says he has been coughing and has some productive yellow phlegm.  Pt is on room air. Patient has a history emphysema. None. Patient denies any skin changes and discoloration.  SWALLOWING/DIET: Pt denies dysphagia . Patient denies any issues with eating. Eats a large breakfast and dinner, sometimes may miss lunch. He also drinks ensure if he misses a meal. He is trying to gain weight and increase eating three times a day.  OTHER:. Patient complaints of insomnia, sleeps 6 hour each night. Encouraged to rest and take naps during the day to increase energy.  Experiences occasional fatigue  There were no vitals taken for this visit.   Wt Readings from Last 3 Encounters:  02/22/23 167 lb 2 oz (75.8 kg)  02/16/23 172 lb 12.8 oz (78.4 kg)  02/07/23 169 lb 3.2 oz (76.7 kg)

## 2023-04-09 ENCOUNTER — Other Ambulatory Visit: Payer: Self-pay | Admitting: Family

## 2023-04-09 ENCOUNTER — Other Ambulatory Visit (HOSPITAL_COMMUNITY): Payer: Self-pay

## 2023-04-09 MED ORDER — NITROGLYCERIN 0.4 MG SL SUBL
0.4000 mg | SUBLINGUAL_TABLET | SUBLINGUAL | 4 refills | Status: AC | PRN
  Filled 2023-04-09: qty 25, 15d supply, fill #0

## 2023-04-10 ENCOUNTER — Other Ambulatory Visit (HOSPITAL_COMMUNITY): Payer: Self-pay

## 2023-04-12 ENCOUNTER — Other Ambulatory Visit (HOSPITAL_COMMUNITY): Payer: Self-pay

## 2023-04-12 ENCOUNTER — Ambulatory Visit (INDEPENDENT_AMBULATORY_CARE_PROVIDER_SITE_OTHER): Payer: Commercial Managed Care - PPO | Admitting: Family Medicine

## 2023-04-12 ENCOUNTER — Encounter: Payer: Self-pay | Admitting: Family Medicine

## 2023-04-12 VITALS — BP 128/70 | HR 73 | Temp 98.1°F | Ht 72.0 in | Wt 175.0 lb

## 2023-04-12 DIAGNOSIS — C342 Malignant neoplasm of middle lobe, bronchus or lung: Secondary | ICD-10-CM

## 2023-04-12 DIAGNOSIS — J101 Influenza due to other identified influenza virus with other respiratory manifestations: Secondary | ICD-10-CM | POA: Insufficient documentation

## 2023-04-12 DIAGNOSIS — I479 Paroxysmal tachycardia, unspecified: Secondary | ICD-10-CM

## 2023-04-12 DIAGNOSIS — R0981 Nasal congestion: Secondary | ICD-10-CM | POA: Diagnosis not present

## 2023-04-12 DIAGNOSIS — R051 Acute cough: Secondary | ICD-10-CM

## 2023-04-12 LAB — POC SOFIA 2 FLU + SARS ANTIGEN FIA
Influenza A, POC: POSITIVE — AB
Influenza B, POC: NEGATIVE
SARS Coronavirus 2 Ag: NEGATIVE

## 2023-04-12 MED ORDER — PREDNISONE 10 MG (21) PO TBPK
ORAL_TABLET | ORAL | 0 refills | Status: DC
  Filled 2023-04-12: qty 21, 6d supply, fill #0

## 2023-04-12 MED ORDER — DOXYCYCLINE HYCLATE 100 MG PO TABS
100.0000 mg | ORAL_TABLET | Freq: Two times a day (BID) | ORAL | 0 refills | Status: AC
  Filled 2023-04-12: qty 14, 7d supply, fill #0

## 2023-04-12 NOTE — Progress Notes (Signed)
Madelaine Bhat, CMA,acting as a Neurosurgeon for Ellender Hose, NP.,have documented all relevant documentation on the behalf of Ellender Hose, NP,as directed by  Ellender Hose, NP while in the presence of Ellender Hose, NP.  Subjective:  [name] , male    DOB: 1950/06/22 , 73 y.o.   MRN: [id number]  Chief Complaint  Patient presents with   URI    HPI  Patient is 73 year old male who presents today for cold symptoms. He reports he has been having bodyaches, headaches, fatigue, nasal and chest congestion , productive cough with yellow phlegm. He reports he has been taking coricidin but it is not working. He states that his wife was very sick about a week ago and the have been together at home. Patient finished his last round of radiation about 3 weeks ago d/t squamous cell carcinoma of the middle lobe of the right lung  , he will have a follow up with the oncologist in 05/2023.  URI  This is a new problem. The problem has been unchanged. There has been no fever. Associated symptoms include congestion, coughing and headaches. Associated symptoms comments: bodyaches. He has tried acetaminophen and antihistamine for the symptoms.     Past Medical History:  Diagnosis Date   Arthritis    Bullous emphysema (HCC)    Severe Bilateral   Cocaine abuse (HCC)    Colitis    with bleeding   COPD (chronic obstructive pulmonary disease) (HCC)    Coronary artery disease    Dyspnea    GERD (gastroesophageal reflux disease)    Headache    sinusitis   Hypertension    LBP (low back pain)    now chronic   Lumbago    Lung mass    Right Upper Lobe pleural based mass, negative  on PET Scan   Myocardial infarction [facility])    Neck pain    nerve pain    Neuromuscular disorder (HCC)    Neuropathy    right legf, calf, foot   Pneumonia    hx   S/P angioplasty with stent 01/13/20 emergent DES to mLAD  01/15/2020   Shortness of breath 03/03/2020   Spine pain    nerve   Therapeutic drug  monitoring    Tobacco abuse      Family History  Adopted: Yes  Problem Relation Age of Onset   Cancer Father    Lung cancer Father    Heart failure Sister    Breast cancer Sister    Emphysema Mother      Current Outpatient Medications:    acetaminophen (TYLENOL) 325 MG tablet, Take 2 tablets (650 mg total) by mouth every 4 (four) hours as needed for headache or mild pain., Disp: , Rfl:    albuterol (PROVENTIL) (2.5 MG/3ML) 0.083% nebulizer solution, Take 3 mLs (2.5 mg total) by nebulization every 6 (six) hours as needed for wheezing or shortness of breath., Disp: 75 mL, Rfl: 2   albuterol (VENTOLIN HFA) 108 (90 Base) MCG/ACT inhaler, INHALE 2 PUFFS INTO THE LUNGS EVERY 6 (SIX) HOURS AS NEEDED FOR WHEEZING OR SHORTNESS OF BREATH., Disp: 8.5 g, Rfl: 2   aspirin 81 MG chewable tablet, Chew 1 tablet (81 mg total) by mouth daily., Disp: , Rfl:    atorvastatin (LIPITOR) 80 MG tablet, Take 1 tablet (80 mg total) by mouth daily., Disp: 90 tablet, Rfl: 2   Budeson-Glycopyrrol-Formoterol (BREZTRI AEROSPHERE) 160-9-4.8 MCG/ACT AERO, Inhale 2 puffs into the lungs 2 (two)  times daily., Disp: 10.7 g, Rfl: 2   diclofenac Sodium (VOLTAREN) 1 % GEL, Apply 2 g topically 2 (two) times daily as needed (joint pain)., Disp: 100 g, Rfl: 2   doxycycline (VIBRA-TABS) 100 MG tablet, Take 1 tablet (100 mg total) by mouth 2 (two) times daily for 7 days., Disp: 14 tablet, Rfl: 0   famotidine (PEPCID) 40 MG tablet, Take 1 tablet (40 mg total) by mouth 2 (two) times daily., Disp: 60 tablet, Rfl: 12   metoprolol succinate (TOPROL XL) 25 MG 24 hr tablet, Take 1/2 tablet (12.5 mg total) by mouth daily., Disp: 90 tablet, Rfl: 2   montelukast (SINGULAIR) 10 MG tablet, Take 1 tablet (10 mg total) by mouth every evening. (Patient taking differently: Take 10 mg by mouth daily as needed (Asthma).), Disp: 30 tablet, Rfl: 11   nicotine polacrilex (NICORETTE) 2 MG lozenge, Use one lozenge every 2-3 hours as needed, Disp: 72 tablet,  Rfl: 0   nitroGLYCERIN (NITROSTAT) 0.4 MG SL tablet, Place 1 tablet (0.4 mg total) under the tongue every 5 (five) minutes as needed for chest pain., Disp: 25 tablet, Rfl: 4   pantoprazole (PROTONIX) 40 MG tablet, Take 1 tablet (40 mg total) by mouth daily. STOP OMEPRAZOLE, Disp: 90 tablet, Rfl: 3   predniSONE (STERAPRED UNI-PAK 21 TAB) 10 MG (21) TBPK tablet, Take as instructed in package., Disp: 21 tablet, Rfl: 0   pregabalin (LYRICA) 50 MG capsule, Take 1 capsule (50 mg total) by mouth 2 (two) times daily as needed., Disp: 180 capsule, Rfl: 0   Respiratory Therapy Supplies (FLUTTER) DEVI, Use as directed, Disp: 1 each, Rfl: 0   tamsulosin (FLOMAX) 0.4 MG CAPS capsule, Take 2 capsules (0.8 mg total) by mouth at bedtime. Pt needs follow up for additional refills (Patient taking differently: Take 0.4 mg by mouth at bedtime.), Disp: 90 capsule, Rfl: 2   valsartan (DIOVAN) 160 MG tablet, Take 1 tablet (160 mg total) by mouth daily., Disp: 90 tablet, Rfl: 2   Allergies  Allergen Reactions   Penicillins Anaphylaxis and Hives     Review of Systems  Constitutional:  Positive for fatigue.  HENT:  Positive for congestion and sinus pressure.   Respiratory:  Positive for cough.   Cardiovascular: Negative.   Genitourinary: Negative.   Skin: Negative.   Neurological:  Positive for headaches.  Psychiatric/Behavioral: Negative.       Today's Vitals   04/12/23 0944  BP: 128/70  Pulse: 73  Temp: 98.1 F (36.7 C)  TempSrc: Oral  Weight: 175 lb (79.4 kg)  Height: 6' (1.829 m)   Body mass index is 23.73 kg/m.  Wt Readings from Last 3 Encounters:  04/12/23 175 lb (79.4 kg)  02/22/23 167 lb 2 oz (75.8 kg)  02/16/23 172 lb 12.8 oz (78.4 kg)    The ASCVD Risk score (Arnett DK, et al., 2019) failed to calculate for the following reasons:   Risk score cannot be calculated because patient has a medical history suggesting prior/existing ASCVD  Objective:  Physical Exam HENT:     Head:  Normocephalic.     Nose: Congestion and rhinorrhea present.     Right Sinus: Frontal sinus tenderness present.     Left Sinus: Frontal sinus tenderness present.  Cardiovascular:     Rate and Rhythm: Tachycardia present.  Pulmonary:     Breath sounds: Examination of the right-middle field reveals rhonchi. Examination of the left-middle field reveals rhonchi. Rhonchi present.  Neurological:     Mental Status:  He is alert.         Assessment And Plan:  Upper respiratory tract infection due to influenza A virus Assessment & Plan: Take all medicines as prescribed.  Orders: -     Doxycycline Hyclate; Take 1 tablet (100 mg total) by mouth 2 (two) times daily for 7 days.  Dispense: 14 tablet; Refill: 0 -     predniSONE; Take as instructed in package.  Dispense: 21 tablet; Refill: 0  Squamous cell carcinoma of middle lobe of right lung [facility]) Assessment & Plan: Had last radiation 3 weeks ago; follow up 05/2023.   Paroxysmal tachycardia (HCC) Assessment & Plan: HR 115;on metoprolol succ. 12.5 mg every day,advised to minimized amount of coffee he drinks daily.   Nasal congestion -     POC SOFIA 2 FLU + SARS ANTIGEN FIA    Return if symptoms worsen or fail to improve.  Patient was given opportunity to ask questions. Patient verbalized understanding of the plan and was able to repeat key elements of the plan. All questions were answered to their satisfaction.    I, Ellender Hose, NP, have reviewed all documentation for this visit. The documentation on 04/17/2023 for the exam, diagnosis, procedures, and orders are all accurate and complete.    IF YOU HAVE BEEN REFERRED TO A SPECIALIST, IT MAY TAKE 1-2 WEEKS TO SCHEDULE/PROCESS THE REFERRAL. IF YOU HAVE NOT HEARD FROM US/SPECIALIST IN TWO WEEKS, PLEASE GIVE [place] A CALL AT [phone].

## 2023-04-17 DIAGNOSIS — R0981 Nasal congestion: Secondary | ICD-10-CM | POA: Insufficient documentation

## 2023-04-17 DIAGNOSIS — I479 Paroxysmal tachycardia, unspecified: Secondary | ICD-10-CM | POA: Insufficient documentation

## 2023-04-17 NOTE — Assessment & Plan Note (Signed)
Had last radiation 3 weeks ago; follow up 05/2023.

## 2023-04-17 NOTE — Assessment & Plan Note (Signed)
Take all medicines as prescribed.

## 2023-04-17 NOTE — Assessment & Plan Note (Signed)
HR 115;on metoprolol succ. 12.5 mg every day,advised to minimized amount of coffee he drinks daily.

## 2023-04-18 ENCOUNTER — Ambulatory Visit: Payer: Self-pay | Admitting: Radiation Oncology

## 2023-04-19 ENCOUNTER — Ambulatory Visit: Payer: Commercial Managed Care - PPO | Attending: Radiation Oncology | Admitting: Radiation Oncology

## 2023-04-19 DIAGNOSIS — F17210 Nicotine dependence, cigarettes, uncomplicated: Secondary | ICD-10-CM | POA: Insufficient documentation

## 2023-04-19 DIAGNOSIS — C342 Malignant neoplasm of middle lobe, bronchus or lung: Secondary | ICD-10-CM | POA: Insufficient documentation

## 2023-04-26 ENCOUNTER — Telehealth: Payer: Self-pay | Admitting: Internal Medicine

## 2023-04-26 NOTE — Telephone Encounter (Signed)
 [facility] [place] Clinical Research Coordinator note:   This visit for Subject Peter Sims with DOB: [birth year] on 04/26/2023. Subject contacted regarding [id number] to inform that Principal Investigator has changed to Dr. Delton Coombes. Voicemail left requesting a return call.

## 2023-04-30 ENCOUNTER — Other Ambulatory Visit (HOSPITAL_COMMUNITY): Payer: Self-pay

## 2023-05-10 ENCOUNTER — Other Ambulatory Visit: Payer: Self-pay | Admitting: Internal Medicine

## 2023-05-10 ENCOUNTER — Ambulatory Visit: Payer: Self-pay | Admitting: Internal Medicine

## 2023-05-10 DIAGNOSIS — J101 Influenza due to other identified influenza virus with other respiratory manifestations: Secondary | ICD-10-CM

## 2023-05-10 NOTE — Telephone Encounter (Signed)
 Copied from CRM [id number]. Topic: Clinical - Medication Refill >> May 10, 2023 11:27 AM Bobbye Morton wrote: Most Recent Primary Care Visit:  Provider: Moshe Salisbury, PAT  Department: [facility]  Visit Type: OFFICE VISIT  Date: 04/12/2023  Medication: predniSONE (STERAPRED UNI-PAK 21 TAB) 10 MG (21) TBPK tablet  Has the patient contacted their pharmacy? Yes (Agent: If no, request that the patient contact the pharmacy for the refill. If patient does not wish to contact the pharmacy document the reason why and proceed with request.) (Agent: If yes, when and what did the pharmacy advise?)  Is this the correct pharmacy for this prescription? Yes If no, delete pharmacy and type the correct one.  This is the patient's preferred pharmacy:  [place] [facility] [address] [place] [address] Phone: [phone] Fax: [phone]   Has the prescription been filled recently? Yes, 04/12/2023  Is the patient out of the medication? Yes  Has the patient been seen for an appointment in the last year OR does the patient have an upcoming appointment? Yes  Can we respond through MyChart? Yes  Agent: Please be advised that Rx refills may take up to 3 business days. We ask that you follow-up with your pharmacy.

## 2023-05-17 ENCOUNTER — Other Ambulatory Visit: Payer: Self-pay | Admitting: Family Medicine

## 2023-05-17 ENCOUNTER — Other Ambulatory Visit (HOSPITAL_COMMUNITY): Payer: Self-pay

## 2023-05-17 MED ORDER — PREDNISONE 10 MG (21) PO TBPK
ORAL_TABLET | ORAL | 0 refills | Status: DC
  Filled 2023-05-17: qty 21, 6d supply, fill #0

## 2023-05-25 ENCOUNTER — Ambulatory Visit: Admitting: Internal Medicine

## 2023-05-25 ENCOUNTER — Other Ambulatory Visit (HOSPITAL_COMMUNITY): Payer: Self-pay

## 2023-05-25 ENCOUNTER — Telehealth: Payer: Self-pay | Admitting: Internal Medicine

## 2023-05-25 ENCOUNTER — Ambulatory Visit (HOSPITAL_BASED_OUTPATIENT_CLINIC_OR_DEPARTMENT_OTHER): Payer: Self-pay | Admitting: Pulmonary Disease

## 2023-05-25 VITALS — BP 100/60 | HR 100 | Temp 97.8°F | Resp 12 | Ht 72.0 in | Wt 169.1 lb

## 2023-05-25 DIAGNOSIS — C3490 Malignant neoplasm of unspecified part of unspecified bronchus or lung: Secondary | ICD-10-CM

## 2023-05-25 DIAGNOSIS — C342 Malignant neoplasm of middle lobe, bronchus or lung: Secondary | ICD-10-CM | POA: Diagnosis not present

## 2023-05-25 DIAGNOSIS — J440 Chronic obstructive pulmonary disease with acute lower respiratory infection: Secondary | ICD-10-CM

## 2023-05-25 DIAGNOSIS — J209 Acute bronchitis, unspecified: Secondary | ICD-10-CM | POA: Diagnosis not present

## 2023-05-25 DIAGNOSIS — R062 Wheezing: Secondary | ICD-10-CM | POA: Diagnosis not present

## 2023-05-25 MED ORDER — LEVOFLOXACIN 500 MG PO TABS
500.0000 mg | ORAL_TABLET | Freq: Every day | ORAL | 0 refills | Status: DC
  Filled 2023-05-25: qty 10, 10d supply, fill #0

## 2023-05-25 MED ORDER — TRELEGY ELLIPTA 100-62.5-25 MCG/ACT IN AEPB
1.0000 | INHALATION_SPRAY | Freq: Every day | RESPIRATORY_TRACT | 11 refills | Status: AC
  Filled 2023-05-25: qty 60, 30d supply, fill #0
  Filled 2023-07-27 – 2023-09-25 (×2): qty 60, 30d supply, fill #1
  Filled 2023-10-22: qty 60, 30d supply, fill #2
  Filled 2023-12-06: qty 60, 30d supply, fill #3
  Filled 2024-01-11: qty 60, 30d supply, fill #4
  Filled 2024-02-27: qty 60, 30d supply, fill #5

## 2023-05-25 MED ORDER — METHYLPREDNISOLONE 4 MG PO TBPK
ORAL_TABLET | ORAL | 0 refills | Status: DC
  Filled 2023-05-25: qty 21, 6d supply, fill #0

## 2023-05-25 MED ORDER — CEFTRIAXONE SODIUM 1 G IJ SOLR
1.0000 g | Freq: Once | INTRAMUSCULAR | Status: AC
  Administered 2023-05-25: 1 g via INTRAMUSCULAR

## 2023-05-25 NOTE — Telephone Encounter (Signed)
 Chief Complaint: wheezing Symptoms: non productive cough, wheezing/SOB, pain with coughing Frequency: x3 weeks Pertinent Negatives: Patient denies fever, URI sx, CP, severe SOB Disposition: [] ED /[] Urgent Care (no appt availability in office) / [x] Appointment(In office/virtual)/ []  [facility]/ [] Home Care/ [] Refused Recommended Disposition /[] [place] [facility]/ []  Follow-up with PCP Additional Notes: Pt c/o wheezing/SOB with non productive cough x 3 weeks. Reports saw PCP and is currently on last day of prednisone, but has not had any relief. Pt also endorses taking respiratory tx as prescribed, as well as nebulizer. Triager reinforced albuterol usage. Triager attempted to schedule with Pulm, but no access. Triager attempted to schedule with PCP, but no access. Scheduled patient per protocol on 05/25/2023 at alternate PCP. Triager will forward encounter for Dr. Vassie Loll to review and advise. Patient verbalized understanding and is expecting call back from office for next steps. Triager also advised that if pt does not hear back from office, to follow disposition for further evaluation/treatment.     Copied from CRM [id number]. Topic: Clinical - Red Word Triage >> May 25, 2023 12:08 PM Orinda Kenner C wrote: Red Word that prompted transfer to Nurse Triage: Patient [phone] is having issues with breathing, wheezing, light headed, hurts to cough and nothing is coming out, feels like something is wrong, has been using Mucinex, will take the last pill today of prednisone from Dr. Allyne Gee. Patient has COPD and laser therapy for cancer in right lung 3 months ago. Patient denies a fever. Patient wants to see Dr. Vassie Loll. Reason for Disposition  [1] Longstanding difficulty breathing (e.g., CHF, COPD, emphysema) AND [2] WORSE than normal  Answer Assessment - Initial Assessment Questions E2C2 Pulmonary Triage - Initial Assessment Questions "Chief Complaint (e.g., cough, sob, wheezing, fever, chills,  sweat or additional symptoms) *Go to specific symptom protocol after initial questions. SOB/wheezing, cough  "How long have symptoms been present?" X3 weeks ago Initially thought "just a cold" - reports did not receive flu shot d/t radiation tx Went to PCP a week ago - prescribed prednisone (last dose tonight) Reports CT scheduled on 7th r/t lung cx s/p laser tx  Have you tested for COVID or Flu? Note: If not, ask patient if a home test can be taken. If so, instruct patient to call back for positive results. Yes, negative within the past week  MEDICINES:   "Have you used any OTC meds to help with symptoms?" Yes If yes, ask "What medications?" Mucinex - little relief  "Have you used your inhalers/maintenance medication?" Yes If yes, "What medications?" Albuterol neb - 3x a daily - has been using consistently for past 2 weeks - reports provides relief for a few hours Breztri 2 puffs twice daily  If inhaler, ask "How many puffs and how often?" Note: Review instructions on medication in the chart. See above  OXYGEN: "Do you wear supplemental oxygen?" No If yes, "How many liters are you supposed to use?" N/a  "Do you monitor your oxygen levels?" No If yes, "What is your reading (oxygen level) today?" N/a  "What is your usual oxygen saturation reading?"  (Note: Pulmonary O2 sats should be 90% or greater) Reports good in office - 98%    3. PATTERN "Does the difficult breathing come and go, or has it been constant since it started?"      Comes and goes 4. SEVERITY: "How bad is your breathing?" (e.g., mild, moderate, severe)    - MILD: No SOB at rest, mild SOB with walking, speaks normally in sentences,  can lie down, no retractions, pulse < 100.    - MODERATE: SOB at rest, SOB with minimal exertion and prefers to sit, cannot lie down flat, speaks in phrases, mild retractions, audible wheezing, pulse 100-120.    - SEVERE: Very SOB at rest, speaks in single words, struggling to  breathe, sitting hunched forward, retractions, pulse > 120      Mild - SOB with exertion/coughing 5. RECURRENT SYMPTOM: "Have you had difficulty breathing before?" If Yes, ask: "When was the last time?" and "What happened that time?"      Yes, prednisone 6. CARDIAC HISTORY: "Do you have any history of heart disease?" (e.g., heart attack, angina, bypass surgery, angioplasty)      MI, COPD 7. LUNG HISTORY: "Do you have any history of lung disease?"  (e.g., pulmonary embolus, asthma, emphysema)     Lung cancer 8. CAUSE: "What do you think is causing the breathing problem?"      flare 9. OTHER SYMPTOMS: "Do you have any other symptoms? (e.g., dizziness, runny nose, cough, chest pain, fever)     Light headedness from coughing, wheeze  Protocols used: Breathing Difficulty-A-AH

## 2023-05-25 NOTE — Telephone Encounter (Signed)
 Pt has appt today with PCP at 05/25/2023, any other recommendations?

## 2023-05-25 NOTE — Progress Notes (Addendum)
 Patient Care Team: Dorothyann Peng, MD as PCP - General (Internal Medicine) Chilton Si, MD as PCP - Cardiology (Cardiology) Pa, [facility]  Visit Date: 05/25/23  Subjective:   Chief Complaint  Patient presents with   Wheezing    3 weeks    BP Readings from Last 1 Encounters:  05/25/23 100/60   Patient Peter Sims,Male DOB:18-Dec-1950,72 y.o. OZH:[id number]   73 y.o. Male, here with his wife, presents today for acute sick visit with Wheezing x3. Patient has a past medical history of Bullous Emphysema; Pulmonary Nodule 1 cm or Greater in Diameter; COPD GOLD 0/ ab with Pseudowheeze Component; Lumbago; Cigarette Smoker; Bronchopneumonia; COPD w/ Exacerbation; Chest Pain; HNP (Herniated Nucleus Pulposus), Lumbar; Essential Hypertension; Chronic Cough; Cervical Disc Disorder at C4-C5 Level with Radiculopathy; Constipation; Hyperlipidemia; Parenchymal Renal Hypertension; Aortic Atherosclerosis; Coronary Atherosclerosis due to Lipid Rich Plaque; COPD with Chronic Bronchitis; Mucopurulent Chronic Bronchitis; NSTEMI (Non-ST Elevated Myocardial Infarction); ST Elevation Myocardial Infarction involving Left Anterior Descending (LAD) Coronary Artery; S/p Angioplasty with Stent 01/13/20 Emergent DES to mLAD; Tobacco Abuse; Shortness of Breath; Other Abnormal Glucose; Benign Prostatic Hyperplasia with Weak Urinary Stream; CAD (Coronary Artery Disease); Unintentional Weight Loss; Hypertensive Heart Disease w/o Heart Failure; Acute Cough; Squamous Cell Carcinoma, Middle Lobe, Right Lung; Upper Respiratory Tract Infection due to Influenza A Virus 04/12/2023; Paroxysmal Tachycardia; and Nasal Congestion. SpO2 95% in-office today. He is prescribed a Breztri inhaler and Albuterol nebulizer solution, which he says he has been using both with some initial mild relief but still wheezes, and he feels that the Spiriva he had previously been on worked better; had also been using Mucinex  twice daily since his cough has been fairly non-productive though he does occasionally expectorate white sputum. Was also prescribed Prednisone on 3/20, which he had initially taken after he had Flu A contributing to an URI in February, that he requested to be refilled since he was still having difficulty breathing and he has 1 more left today before finishing the course, but this hasn't been much help apparently. Since his pulmonary nodule was noted he has had 3 treatments of radiation, and is following-up with Dr. Basilio Cairo on 4/15 after he has a repeat CXR on 4/7 to re-evaluate efficacy of radiation. Since he has continued to smoke, Dr. Cliffton Asters did not recommend he have surgery, but he says that he has tried to quit smoking using various methods (Nicotine patches caused hyperactivity and Lozenges did help while he was using them, though caused hiccups, but once he stopped he craved cigarettes again). They both express their frustration with his course of treatment since his diagnosis, feeling like they've not been told much regarding what his options are, but he is adamant about avoiding chemotherapy. He also says that he has also been dealing with fluctuations of diarrhea and constipation, currently constipated but did have a BM yesterday after taking Milk of Magnesia but did not have one today thus far.   Past Medical History:  Diagnosis Date   Arthritis    Bullous emphysema (HCC)    Severe Bilateral   Cocaine abuse (HCC)    Colitis    with bleeding   COPD (chronic obstructive pulmonary disease) (HCC)    Coronary artery disease    Dyspnea    GERD (gastroesophageal reflux disease)    Headache    sinusitis   Hypertension    LBP (low back pain)    now chronic   Lumbago    Lung mass  Right Upper Lobe pleural based mass, negative  on PET Scan   Myocardial infarction [facility])    Neck pain    nerve pain    Neuromuscular disorder (HCC)    Neuropathy    right legf, calf, foot   Pneumonia    hx    S/P angioplasty with stent 01/13/20 emergent DES to mLAD  01/15/2020   Shortness of breath 03/03/2020   Spine pain    nerve   Therapeutic drug monitoring    Tobacco abuse     Allergies  Allergen Reactions   Penicillins Anaphylaxis and Hives    Family History  Adopted: Yes  Problem Relation Age of Onset   Cancer Father    Lung cancer Father    Heart failure Sister    Breast cancer Sister    Emphysema Mother   Stage 4 Cancer in Sister  Father deceased from Cancer Social History   Social History Narrative   Not on file  Has lived in [place] x30 years. Mostly worked in Chemical engineer trades, but now prefers to spend time with his grandchildren and dog. Married - wife, Dina Rich, used to work in Financial planner, now works in Physicist, medical. for [employer]  Review of Systems  Respiratory:  Positive for cough, sputum production (white), shortness of breath and wheezing.   Gastrointestinal:  Positive for constipation and diarrhea.  All other systems reviewed and are negative.    Objective:  Vitals: BP 100/60 (BP Location: Right Arm, Patient Position: Sitting, Cuff Size: Normal)   Pulse 100   Temp 97.8 F (36.6 C) (Temporal)   Resp 12   Ht 6' (1.829 m)   Wt 169 lb 1.9 oz (76.7 kg)   SpO2 95%   BMI 22.94 kg/m   Physical Exam Constitutional:      General: He is not in acute distress.    Appearance: Normal appearance. He is not ill-appearing.  HENT:     Head: Normocephalic and atraumatic.  Cardiovascular:     Rate and Rhythm: Normal rate and regular rhythm.     Pulses: Normal pulses.     Heart sounds: Normal heart sounds. No murmur heard.    No friction rub. No gallop.  Pulmonary:     Comments: Congestion bilaterally Skin:    General: Skin is warm and dry.  Neurological:     Mental Status: He is alert and oriented to person, place, and time. Mental status is at baseline.  Psychiatric:        Mood and Affect: Mood normal.        Behavior: Behavior normal.         Thought Content: Thought content normal.        Judgment: Judgment normal.     He has bilateral rhonchi and wheezing on chest exam but is comfortable talking in exam room  Results:  Studies Obtained And Personally Reviewed By Me: CHEST - 2 VIEW 01/24/2024  COMPARISON:  Chest x-ray 01/22/2023.   FINDINGS: Volumes are increased with emphysematous changes. Fiducial marker projecting over the right middle lobe related to recent biopsy. Ill-defined airspace disease surrounding the fiducial marker has decreased compared to the prior study, likely interval regression of previously noted post biopsy hemorrhage. No new acute consolidative airspace disease. No pleural effusions. No pneumothorax. No evidence of pulmonary edema. Heart size is normal. Upper mediastinal contours are within normal limits. Atherosclerotic calcifications are noted in the thoracic aorta.   IMPRESSION: 1. Resolving post biopsy hemorrhage in the right middle  lobe. No new acute findings are noted. 2. Emphysema. 3. Aortic atherosclerosis.    Cytology - Non PAP; CYTOLOGY - NON GYN CYTOLOGY - NON PAP Non-Gynecological Cytology Report  Specimen Submitted:  C. LUNG, RML, LAVAGE:  FINAL MICROSCOPIC DIAGNOSIS: - Squamous cell carcinoma     Labs:     Component Value Date/Time   NA 139 01/24/2023 0550   NA 142 08/03/2022 0954   K 4.2 01/24/2023 0550   CL 106 01/24/2023 0550   CO2 24 01/24/2023 0550   GLUCOSE 129 (H) 01/24/2023 0550   BUN 11 01/24/2023 0550   BUN 11 08/03/2022 0954   CREATININE 1.03 01/24/2023 0550   CALCIUM 9.9 01/24/2023 0550   PROT 7.1 01/24/2023 0748   PROT 7.2 08/03/2022 0954   ALBUMIN 3.6 01/24/2023 0748   ALBUMIN 4.3 08/03/2022 0954   AST 26 01/24/2023 0748   ALT 27 01/24/2023 0748   ALKPHOS 87 01/24/2023 0748   BILITOT 0.9 01/24/2023 0748   BILITOT 0.7 08/03/2022 0954   GFRNONAA >60 01/24/2023 0550   GFRAA 74 12/02/2019 1214    Lab Results  Component Value Date   WBC 7.2  01/24/2023   HGB 16.0 01/24/2023   HCT 48.7 01/24/2023   MCV 88.1 01/24/2023   PLT 212 01/24/2023   Lab Results  Component Value Date   CHOL 146 08/03/2022   HDL 62 08/03/2022   LDLCALC 72 08/03/2022   TRIG 57 08/03/2022   CHOLHDL 2.4 08/03/2022   Lab Results  Component Value Date   HGBA1C 6.4 (H) 02/07/2023    Lab Results  Component Value Date   TSH 1.820 10/02/2022    Lab Results  Component Value Date   PSA1 0.7 08/03/2022   PSA1 0.7 06/28/2021   PSA1 0.6 06/23/2020     Assessment & Plan:   Orders Placed This Encounter  Procedures   CBC with Differential/Platelet   COMPLETE METABOLIC PANEL WITHOUT GFR   T4, free   TSH   Squamous cell carcinoma right lung with Wheezing: Recommend that he sees an Oncologist to discuss further treatment options - referred to Dr. Shirline Frees. His recent clinical course was discussed with wife and him at length today.   COPD exacerbation-Suggested Trelegy Ellipta inhaler which may work better for him-  a coupon provided but [place] was going to be around $35  for this inhaler which is cost prohibitive for them. Will refer back to Pulmonary for best treatment options. Prescribed Medrol 4 mg tapering course to take 6 tabs day 1 and decrease by one tab daily 6-5-4-3-2-1 taper .  Levaquin 500 mg - take 1 tablet daily with a meal for 7 days, and  Rocephin 1 g IM given in office today today.  Appears to have acute lower respiratory infection and COPD exacerbation in addition to lung nodule.He is comfortable at rest.  We are making referral to Dr. Shirline Frees. Oncology for evaluation and to discuss treatment options.    He will continue to see Dr. Basilio Cairo who has been very helpful with Radiation therapy.   Needs to have better control of COPD and will have Pulmonary see him again to see what affordable inhalers would be best for him.   I,Peter Sims,acting as a Neurosurgeon for Peter Mackintosh, MD.,have documented all relevant documentation on the behalf of  Peter Mackintosh, MD,as directed by  Peter Mackintosh, MD while in the presence of Peter Mackintosh, MD.  50 minutes spent with patient.  Peter Alamin,  MD, have reviewed all documentation for this visit. The documentation on 05/26/23 for the exam, diagnosis, procedures, and orders are all accurate and complete.   IMargaree Mackintosh, MD, have reviewed all documentation for this visit. The documentation on 05/26/23 for the exam, diagnosis, procedures, and orders are all accurate and complete.

## 2023-05-25 NOTE — Telephone Encounter (Signed)
 Dr Lenord Fellers feels patient needs urgent follow with pulmonary, because he and his wife have questions about his inhalers, and he has been referred to Oncology. See Dr Iona Hansen note.

## 2023-05-26 ENCOUNTER — Encounter: Payer: Self-pay | Admitting: Internal Medicine

## 2023-05-26 LAB — CBC WITH DIFFERENTIAL/PLATELET
Absolute Lymphocytes: 3640 {cells}/uL (ref 850–3900)
Absolute Monocytes: 820 {cells}/uL (ref 200–950)
Basophils Absolute: 40 {cells}/uL (ref 0–200)
Basophils Relative: 0.4 %
Eosinophils Absolute: 90 {cells}/uL (ref 15–500)
Eosinophils Relative: 0.9 %
HCT: 47.9 % (ref 38.5–50.0)
Hemoglobin: 16 g/dL (ref 13.2–17.1)
MCH: 29.3 pg (ref 27.0–33.0)
MCHC: 33.4 g/dL (ref 32.0–36.0)
MCV: 87.6 fL (ref 80.0–100.0)
MPV: 10.2 fL (ref 7.5–12.5)
Monocytes Relative: 8.2 %
Neutro Abs: 5410 {cells}/uL (ref 1500–7800)
Neutrophils Relative %: 54.1 %
Platelets: 239 10*3/uL (ref 140–400)
RBC: 5.47 10*6/uL (ref 4.20–5.80)
RDW: 14.4 % (ref 11.0–15.0)
Total Lymphocyte: 36.4 %
WBC: 10 10*3/uL (ref 3.8–10.8)

## 2023-05-26 LAB — COMPLETE METABOLIC PANEL WITHOUT GFR
AG Ratio: 1.3 (calc) (ref 1.0–2.5)
ALT: 24 U/L (ref 9–46)
AST: 18 U/L (ref 10–35)
Albumin: 4 g/dL (ref 3.6–5.1)
Alkaline phosphatase (APISO): 92 U/L (ref 35–144)
BUN: 18 mg/dL (ref 7–25)
CO2: 26 mmol/L (ref 20–32)
Calcium: 9.6 mg/dL (ref 8.6–10.3)
Chloride: 101 mmol/L (ref 98–110)
Creat: 1.17 mg/dL (ref 0.70–1.28)
Globulin: 3 g/dL (ref 1.9–3.7)
Glucose, Bld: 109 mg/dL — ABNORMAL HIGH (ref 65–99)
Potassium: 4.6 mmol/L (ref 3.5–5.3)
Sodium: 139 mmol/L (ref 135–146)
Total Bilirubin: 0.6 mg/dL (ref 0.2–1.2)
Total Protein: 7 g/dL (ref 6.1–8.1)

## 2023-05-26 LAB — TSH: TSH: 2.18 m[IU]/L (ref 0.40–4.50)

## 2023-05-26 LAB — T4, FREE: Free T4: 1.3 ng/dL (ref 0.8–1.8)

## 2023-05-26 NOTE — Patient Instructions (Addendum)
 We will ask Pulmonologist to assess COPD and help patient obtain appropriate inhaler he can afford. He will follow up with Dr. Basilio Cairo soon. He has been receiving radiation treatments to right lung. Referral made to Dr. Shirline Frees, Oncologist to discuss chemotherapy options.Patient being placed on medrol taper.  Needs better control of COPD. Lots of respiratory congestion bilateral lungs. Given Rocephin 1 gram IM and started on Levaquin for suspected bacterial lower respiratory infection.Patient and wife agreeable to this plan.

## 2023-05-28 ENCOUNTER — Other Ambulatory Visit (HOSPITAL_COMMUNITY): Payer: Self-pay

## 2023-05-28 ENCOUNTER — Telehealth: Payer: Self-pay

## 2023-05-28 MED ORDER — TRELEGY ELLIPTA 100-62.5-25 MCG/ACT IN AEPB: 1.0000 | INHALATION_SPRAY | Freq: Every day | RESPIRATORY_TRACT | Status: DC

## 2023-05-28 NOTE — Addendum Note (Signed)
 Addended by: Christen Butter on: 05/28/2023 11:55 AM   Modules accepted: Orders

## 2023-05-28 NOTE — Telephone Encounter (Signed)
 Copied from CRM [id number]. Topic: Clinical - Medication Question >> May 28, 2023 10:24 AM Duncan Dull wrote: Reason for CRM: patient is in need of a sample of Fluticasone-Umeclidin-Vilant (TRELEGY ELLIPTA) because he is currently out and need it before he have his CT scan appointment. Please contact patient [phone], if no answer please contact [phone] which is his daughter number.

## 2023-05-28 NOTE — Telephone Encounter (Addendum)
 I called and spoke with the pt  He is having issues with cost of trelegy  I have left him 2 samples for pick up and also given pt assistance forms per pt request  Nothing further needed

## 2023-05-28 NOTE — Telephone Encounter (Signed)
 Patient spoke with Peter Sims this morning. Expressed that he saw his PCP and did not wish to f/u with pulm at this time as he was taken care of already. Nothing further needed.

## 2023-05-28 NOTE — Telephone Encounter (Signed)
 Spoke with pt They changed his "puffer" from Irene to Trelegy and placed him on antibiotics. He does not want to be seen by [place] at this time. He states the alternate PC he saw was wonderful and he feels he needs to wait until after his CT on 05/30/2023 before he is seen and is fine to be seen by an NP he will call to get this appt schheduled

## 2023-05-28 NOTE — Telephone Encounter (Signed)
 Please offer him an appt at 3-15 today

## 2023-05-29 ENCOUNTER — Other Ambulatory Visit

## 2023-06-01 ENCOUNTER — Other Ambulatory Visit (HOSPITAL_BASED_OUTPATIENT_CLINIC_OR_DEPARTMENT_OTHER): Payer: Self-pay | Admitting: Pulmonary Disease

## 2023-06-04 ENCOUNTER — Ambulatory Visit (HOSPITAL_COMMUNITY)
Admission: RE | Admit: 2023-06-04 | Discharge: 2023-06-04 | Disposition: A | Discharge status: Home / Self Care | Source: Ambulatory Visit | Attending: Radiation Oncology | Admitting: Radiation Oncology

## 2023-06-04 ENCOUNTER — Other Ambulatory Visit (HOSPITAL_COMMUNITY): Payer: Self-pay

## 2023-06-04 DIAGNOSIS — C342 Malignant neoplasm of middle lobe, bronchus or lung: Secondary | ICD-10-CM | POA: Insufficient documentation

## 2023-06-04 DIAGNOSIS — J432 Centrilobular emphysema: Secondary | ICD-10-CM | POA: Diagnosis not present

## 2023-06-04 DIAGNOSIS — I700 Atherosclerosis of aorta: Secondary | ICD-10-CM | POA: Diagnosis not present

## 2023-06-04 DIAGNOSIS — C3490 Malignant neoplasm of unspecified part of unspecified bronchus or lung: Secondary | ICD-10-CM | POA: Diagnosis not present

## 2023-06-04 MED ORDER — ALBUTEROL SULFATE (2.5 MG/3ML) 0.083% IN NEBU
2.5000 mg | INHALATION_SOLUTION | Freq: Four times a day (QID) | RESPIRATORY_TRACT | 2 refills | Status: AC | PRN
  Filled 2023-06-04: qty 75, 7d supply, fill #0
  Filled 2024-01-10: qty 75, 7d supply, fill #1

## 2023-06-05 ENCOUNTER — Telehealth: Payer: Self-pay

## 2023-06-05 ENCOUNTER — Telehealth: Payer: Self-pay | Admitting: Radiation Oncology

## 2023-06-05 NOTE — Telephone Encounter (Signed)
 Pt's wife called requesting to r/s upcoming f/u appt. She states she will not be available to accompany pt due to travel, this is also the case for all their children. She is wanting to make sure pt has family support available for results and and follow-ups needed after that. Pt's appt r/s to 4/22@3 :00pm at request of pt's spouse.

## 2023-06-05 NOTE — Telephone Encounter (Signed)
 Spoke with patients wife, Babette Relic about upcoming appts.  Confirmed upcoming appts. She asked if patient needed to get labs drew when labs were drew on 05/25/2023. Informed her that patient does not need labs again.  If anything changes and extra labs are needed, will give her a call.  She verbalized understanding.

## 2023-06-11 ENCOUNTER — Telehealth: Payer: Self-pay | Admitting: Medical Oncology

## 2023-06-11 NOTE — Telephone Encounter (Signed)
 Pt left message asking for directions to cancer center. I returned his call and left VM with address.

## 2023-06-12 ENCOUNTER — Ambulatory Visit: Payer: Self-pay | Admitting: Radiation Oncology

## 2023-06-12 NOTE — Progress Notes (Signed)
 [name] presents for an in person follow up with Dr. Lurena Sally. He completed radiation therapy for malignant neoplasm of the bronchus on 03/12/2023.    PAIN: He is currently in no pain.   RESPIRATORY: Wheezing. Gets exhausted while walking but denies shortness. Pt is on room air. Noted No skin changes None.  SWALLOWING/DIET: Pt denies dysphagia .Patient includes all the food groups during his meals., eats twice a day.  OTHER: Pt complains of fatigue and some insomnia.    Wt Readings from Last 3 Encounters:  05/25/23 169 lb 1.9 oz (76.7 kg)  04/12/23 175 lb (79.4 kg)  02/22/23 167 lb 2 oz (75.8 kg)

## 2023-06-13 ENCOUNTER — Inpatient Hospital Stay: Attending: Internal Medicine | Admitting: Internal Medicine

## 2023-06-13 ENCOUNTER — Other Ambulatory Visit

## 2023-06-13 VITALS — BP 132/88 | HR 91 | Temp 97.5°F | Resp 17 | Ht 72.0 in | Wt 172.3 lb

## 2023-06-13 DIAGNOSIS — C342 Malignant neoplasm of middle lobe, bronchus or lung: Secondary | ICD-10-CM

## 2023-06-13 DIAGNOSIS — Z803 Family history of malignant neoplasm of breast: Secondary | ICD-10-CM | POA: Diagnosis not present

## 2023-06-13 DIAGNOSIS — I10 Essential (primary) hypertension: Secondary | ICD-10-CM | POA: Diagnosis not present

## 2023-06-13 DIAGNOSIS — I2510 Atherosclerotic heart disease of native coronary artery without angina pectoris: Secondary | ICD-10-CM | POA: Diagnosis not present

## 2023-06-13 DIAGNOSIS — F17210 Nicotine dependence, cigarettes, uncomplicated: Secondary | ICD-10-CM | POA: Diagnosis not present

## 2023-06-13 DIAGNOSIS — Z8049 Family history of malignant neoplasm of other genital organs: Secondary | ICD-10-CM

## 2023-06-13 DIAGNOSIS — J449 Chronic obstructive pulmonary disease, unspecified: Secondary | ICD-10-CM

## 2023-06-13 DIAGNOSIS — Z923 Personal history of irradiation: Secondary | ICD-10-CM

## 2023-06-13 DIAGNOSIS — Z801 Family history of malignant neoplasm of trachea, bronchus and lung: Secondary | ICD-10-CM | POA: Diagnosis not present

## 2023-06-13 DIAGNOSIS — C3490 Malignant neoplasm of unspecified part of unspecified bronchus or lung: Secondary | ICD-10-CM

## 2023-06-13 NOTE — Progress Notes (Signed)
 [facility] [phone]   [phone]  CONSULT NOTE  REFERRING PHYSICIAN: Dr. Levy Pupa  REASON FOR CONSULTATION:  73 years old African-American male diagnosed with lung cancer  HPI Peter Sims is a 73 y.o. male presented to the clinic today for initial evaluation of recently diagnosed lung cancer.  His wife Peter Sims was available by phone during the visit.Discussed the use of AI scribe software for clinical note transcription with the patient, who gave verbal consent to proceed.  History of Present Illness   Peter Sims is a 73 year old male with lung cancer who presents for follow-up after radiation therapy. He was referred by Dr. Allyne Gee for cancer screening, which led to the diagnosis of lung cancer.  He was diagnosed with lung cancer following a CT screening that revealed a 1.6 cm right middle lobe lung nodule. A subsequent PET scan on November 23, 2022, showed the nodule was hypermetabolic and had increased to 2.4 cm. A bronchoscopy on January 22, 2023, confirmed squamous cell carcinoma. He was initially referred to a thoracic surgeon, but due to smoking and surgical concerns, he was treated with SBRT by a radiation oncologist. He is currently under observation with no additional treatments since the radiation therapy.  He experiences a persistent cough and congestion. He has been treated with two rounds of prednisone in the last two months, which has helped alleviate symptoms. He also uses Mucinex to manage phlegm, which has transitioned from clear to yellow. No hemoptysis, but he notes occasional nonproductive cough and chest pain, which he attributes to congestion.  He experiences fatigue and lightheadedness, particularly with exertion, such as yard work. He is on blood pressure medication and monitors his blood pressure closely.  His past medical history includes COPD, arthritis, acid reflux, high blood pressure, a heart attack  three years ago with stent placement, and a neuromuscular disorder related to past back surgery causing nerve damage in his right leg. He is borderline diabetic but not on medication for it.  He has a significant smoking history, having smoked for 55 years, and acknowledges the difficulty in quitting. 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  Family history is limited due to being adopted, but he notes a biological sister who died of cervical cancer at age 71. He is married with four grown children.       HPI  Past Medical History:  Diagnosis Date   Arthritis    Bullous emphysema (HCC)    Severe Bilateral   Cocaine abuse (HCC)    Colitis    with bleeding   COPD (chronic obstructive pulmonary disease) (HCC)    Coronary artery disease    Dyspnea    GERD (gastroesophageal reflux disease)    Headache    sinusitis   Hypertension    LBP (low back pain)    now chronic   Lumbago    Lung mass    Right Upper Lobe pleural based mass, negative  on PET Scan   Myocardial infarction [facility])    Neck pain    nerve pain    Neuromuscular disorder (HCC)    Neuropathy    right legf, calf, foot   Pneumonia    hx   S/P angioplasty with stent 01/13/20 emergent DES to mLAD  01/15/2020   Shortness of breath 03/03/2020   Spine pain    nerve   Therapeutic drug monitoring    Tobacco abuse  Past Surgical History:  Procedure Laterality Date   BRONCHIAL BIOPSY  01/22/2023   Procedure: BRONCHIAL BIOPSIES;  Surgeon: Leslye Peer, MD;  Location: [facility] ENDOSCOPY;  Service: Pulmonary;;   BRONCHIAL BRUSHINGS  01/22/2023   Procedure: BRONCHIAL BRUSHINGS;  Surgeon: Leslye Peer, MD;  Location: [facility] ENDOSCOPY;  Service: Pulmonary;;   BRONCHIAL NEEDLE ASPIRATION BIOPSY  01/22/2023   Procedure: BRONCHIAL NEEDLE ASPIRATION BIOPSIES;  Surgeon: Leslye Peer, MD;  Location: [facility] ENDOSCOPY;  Service: Pulmonary;;   BRONCHIAL WASHINGS  01/22/2023   Procedure: BRONCHIAL  WASHINGS;  Surgeon: Leslye Peer, MD;  Location: [facility] ENDOSCOPY;  Service: Pulmonary;;   CARDIAC CATHETERIZATION     COLONOSCOPY     CORONARY/GRAFT ACUTE MI REVASCULARIZATION N/A 01/14/2020   Procedure: Coronary/Graft Acute MI Revascularization;  Surgeon: Tonny Bollman, MD;  Location: [facility] INVASIVE CV LAB;  Service: Cardiovascular;  Laterality: N/A;   FIDUCIAL MARKER PLACEMENT  01/22/2023   Procedure: FIDUCIAL MARKER PLACEMENT;  Surgeon: Leslye Peer, MD;  Location: [facility] ENDOSCOPY;  Service: Pulmonary;;   LEFT HEART CATH AND CORONARY ANGIOGRAPHY N/A 01/14/2020   Procedure: LEFT HEART CATH AND CORONARY ANGIOGRAPHY;  Surgeon: Tonny Bollman, MD;  Location: [facility] INVASIVE CV LAB;  Service: Cardiovascular;  Laterality: N/A;   LEFT HEART CATH AND CORONARY ANGIOGRAPHY N/A 12/13/2020   Procedure: LEFT HEART CATH AND CORONARY ANGIOGRAPHY;  Surgeon: Tonny Bollman, MD;  Location: [facility] INVASIVE CV LAB;  Service: Cardiovascular;  Laterality: N/A;   LUMBAR LAMINECTOMY/DECOMPRESSION MICRODISCECTOMY Right 05/03/2016   Procedure: MICRODISCECTOMY LUMBAR FIVE- SACRAL ONE RIGHT;  Surgeon: Coletta Memos, MD;  Location: [facility] OR;  Service: Neurosurgery;  Laterality: Right;   NO PAST SURGERIES      Family History  Adopted: Yes  Problem Relation Age of Onset   Cancer Father    Lung cancer Father    Heart failure Sister    Breast cancer Sister    Emphysema Mother     Social History Social History   Tobacco Use   Smoking status: Every Day    Current packs/day: 0.25    Average packs/day: 0.3 packs/day for 45.0 years (11.3 ttl pk-yrs)    Types: Cigarettes    Passive exposure: Current   Smokeless tobacco: Never   Tobacco comments:    Pt states that he smoke 5 ciggs a day as of 03/08/2022 LW    Pt chewing nicorette gum as of  03/08/2022 LW    04/22/20 Patient given 1-800-quit-now  and the link to [facility] health virtual smoking cessation link        Pt is smoking 10 ciggs daily as of 02/03/2022 LW  Vaping Use    Vaping status: Never Used  Substance Use Topics   Alcohol use: Not Currently    Comment: scotch once a week   Drug use: Yes    Types: Cocaine, Marijuana    Comment: crack cocaine quit 2003    Allergies  Allergen Reactions   Penicillins Anaphylaxis and Hives    Current Outpatient Medications  Medication Sig Dispense Refill   acetaminophen (TYLENOL) 325 MG tablet Take 2 tablets (650 mg total) by mouth every 4 (four) hours as needed for headache or mild pain.     albuterol (PROVENTIL) (2.5 MG/3ML) 0.083% nebulizer solution Take 3 mLs (2.5 mg total) by nebulization every 6 (six) hours as needed for wheezing or shortness of breath. 75 mL 2   albuterol (VENTOLIN HFA) 108 (90 Base) MCG/ACT inhaler INHALE 2 PUFFS INTO THE LUNGS EVERY 6 (SIX) HOURS AS  NEEDED FOR WHEEZING OR SHORTNESS OF BREATH. 8.5 g 2   aspirin 81 MG chewable tablet Chew 1 tablet (81 mg total) by mouth daily.     atorvastatin (LIPITOR) 80 MG tablet Take 1 tablet (80 mg total) by mouth daily. 90 tablet 2   diclofenac Sodium (VOLTAREN) 1 % GEL Apply 2 g topically 2 (two) times daily as needed (joint pain). 100 g 2   famotidine (PEPCID) 40 MG tablet Take 1 tablet (40 mg total) by mouth 2 (two) times daily. 60 tablet 12   Fluticasone-Umeclidin-Vilant (TRELEGY ELLIPTA) 100-62.5-25 MCG/ACT AEPB Inhale 1 puff into the lungs daily. 60 each 11   Fluticasone-Umeclidin-Vilant (TRELEGY ELLIPTA) 100-62.5-25 MCG/ACT AEPB Inhale 1 puff into the lungs daily.     levofloxacin (LEVAQUIN) 500 MG tablet Take 1 tablet (500 mg total) by mouth daily. 10 tablet 0   methylPREDNISolone (MEDROL DOSEPAK) 4 MG TBPK tablet Take in tapering course as directed 6-5-4-3-2-1 21 tablet 0   metoprolol succinate (TOPROL XL) 25 MG 24 hr tablet Take 1/2 tablet (12.5 mg total) by mouth daily. 90 tablet 2   montelukast (SINGULAIR) 10 MG tablet Take 1 tablet (10 mg total) by mouth every evening. (Patient taking differently: Take 10 mg by mouth daily as needed (Asthma).)  30 tablet 11   nicotine polacrilex (NICORETTE) 2 MG lozenge Use one lozenge every 2-3 hours as needed 72 tablet 0   nitroGLYCERIN (NITROSTAT) 0.4 MG SL tablet Place 1 tablet (0.4 mg total) under the tongue every 5 (five) minutes as needed for chest pain. 25 tablet 4   pantoprazole (PROTONIX) 40 MG tablet Take 1 tablet (40 mg total) by mouth daily. STOP OMEPRAZOLE 90 tablet 3   pregabalin (LYRICA) 50 MG capsule Take 1 capsule (50 mg total) by mouth 2 (two) times daily as needed. 180 capsule 0   Respiratory Therapy Supplies (FLUTTER) DEVI Use as directed 1 each 0   tamsulosin (FLOMAX) 0.4 MG CAPS capsule Take 2 capsules (0.8 mg total) by mouth at bedtime. Pt needs follow up for additional refills (Patient taking differently: Take 0.4 mg by mouth at bedtime.) 90 capsule 2   valsartan (DIOVAN) 160 MG tablet Take 1 tablet (160 mg total) by mouth daily. 90 tablet 2   No current facility-administered medications for this visit.    Review of Systems  Constitutional: positive for fatigue Eyes: negative Ears, nose, mouth, throat, and face: negative Respiratory: positive for cough Cardiovascular: negative Gastrointestinal: negative Genitourinary:negative Integument/breast: negative Hematologic/lymphatic: negative Musculoskeletal:negative Neurological: negative Behavioral/Psych: negative Endocrine: negative Allergic/Immunologic: negative  Physical Exam  VWU:[id number], healthy, no distress, well nourished, and well developed SKIN: skin color, texture, turgor are normal, no rashes or significant lesions HEAD: Normocephalic, No masses, lesions, tenderness or abnormalities EYES: normal, PERRLA, Conjunctiva are pink and non-injected EARS: External ears normal, Canals clear OROPHARYNX:no exudate, no erythema, and lips, buccal mucosa, and tongue normal  NECK: supple, no adenopathy, no JVD LYMPH:  no palpable lymphadenopathy, no hepatosplenomegaly LUNGS: clear to auscultation , and palpation HEART:  regular rate & rhythm, no murmurs, and no gallops ABDOMEN:abdomen soft, non-tender, normal bowel sounds, and no masses or organomegaly BACK: Back symmetric, no curvature., No CVA tenderness EXTREMITIES:no joint deformities, effusion, or inflammation, no edema  NEURO: alert & oriented x 3 with fluent speech, no focal motor/sensory deficits  PERFORMANCE STATUS: ECOG 1  LABORATORY DATA: Lab Results  Component Value Date   WBC 10.0 05/25/2023   HGB 16.0 05/25/2023   HCT 47.9 05/25/2023   MCV 87.6 05/25/2023  PLT 239 05/25/2023      Chemistry      Component Value Date/Time   NA 139 05/25/2023 1551   NA 142 08/03/2022 0954   K 4.6 05/25/2023 1551   CL 101 05/25/2023 1551   CO2 26 05/25/2023 1551   BUN 18 05/25/2023 1551   BUN 11 08/03/2022 0954   CREATININE 1.17 05/25/2023 1551   GLU 100 04/04/2017 0000      Component Value Date/Time   CALCIUM 9.6 05/25/2023 1551   ALKPHOS 87 01/24/2023 0748   AST 18 05/25/2023 1551   ALT 24 05/25/2023 1551   BILITOT 0.6 05/25/2023 1551   BILITOT 0.7 08/03/2022 0954       RADIOGRAPHIC STUDIES: No results found.  ASSESSMENT: This is a very pleasant 73 years old African-American male with stage Ia (T1c, N0, M0) non-small cell lung cancer, squamous cell carcinoma presented with right middle lobe lung nodule status post SBRT under the care of Dr. Lurena Sally in January 2025 and the patient is currently on observation.   PLAN: I had a lengthy discussion with the patient and his wife today about his current disease stage, prognosis and treatment options.  The patient received curative therapy for his condition with the curative radiotherapy. He is currently on observation and feeling fine.  He had repeat CT scan of the chest performed on 06/04/2023 but the final report is still pending.  I personally and independently reviewed the scan images and I do not see any evidence for disease progression but I will wait for the final report for confirmation.     Non-small cell lung cancer (NSCLC) - Stage IA (T1c, N0, M0) squamous cell carcinoma Stage I squamous cell carcinoma of the right middle lobe, initially a 1.6 cm nodule on CT in August 2024, increased to 2.4 cm and hypermetabolic on PET in September 2024. Diagnosis confirmed via bronchoscopy. Due to smoking history and surgical concerns, SBRT was administered as curative treatment. Recent imaging suggests tumor reduction, pending radiologist's report. No chemotherapy, immunotherapy, or targeted therapy indicated. Radiation therapy chosen over surgery due to smoking and respiratory function concerns. - Follow-up every six months for two years, then annually until five years post-treatment. - Schedule chest scan in October. - Review radiologist's report upon availability and address any concerning findings.  Chronic obstructive pulmonary disease (COPD) COPD likely exacerbated by smoking history. Reports persistent nonproductive cough and congestion, treated with prednisone and Mucinex. Continues smoking, increasing risk for COPD exacerbation and lung cancer progression. - Advise smoking cessation to improve respiratory health and reduce cancer risk. - Consider Delsym for cough suppression.  Hypertension Hypertension managed with medication. Experiences occasional lightheadedness, possibly due to blood pressure fluctuations. - Monitor blood pressure closely.  Coronary artery disease (CAD) Myocardial infarction three years ago with stent placement. No current cardiac symptoms.  Neuromuscular disorder Lower back surgery with resultant nerve damage, causing intermittent numbness and burning in the right leg. On medication for symptom management.  Goals of Care Monitor for lung cancer recurrence and manage symptoms or complications. Aim for cure with radiation therapy due to surgical concerns related to smoking and respiratory function.  Follow-up Regular follow-up required to monitor lung cancer  status and manage COPD and other chronic conditions. - Schedule follow-up appointment in six months. - Arrange chest scan in October.   The patient was advised to call immediately if he has any other concerning symptoms in the interval. The patient voices understanding of current disease status and treatment options and is  in agreement with the current care plan.  All questions were answered. The patient knows to call the clinic with any problems, questions or concerns. We can certainly see the patient much sooner if necessary.  Thank you so much for allowing me to participate in the care of Peter Sims. I will continue to follow up the patient with you and assist in his care.  The total time spent in the appointment was 60 minutes.  Disclaimer: This note was dictated with voice recognition software. Similar sounding words can inadvertently be transcribed and may not be corrected upon review.   Aurelio Blower June 13, 2023, 2:32 PM

## 2023-06-19 ENCOUNTER — Ambulatory Visit
Admission: RE | Admit: 2023-06-19 | Discharge: 2023-06-19 | Disposition: A | Discharge status: Home / Self Care | Source: Ambulatory Visit | Attending: Radiation Oncology | Admitting: Radiation Oncology

## 2023-06-19 VITALS — BP 102/73 | HR 78 | Temp 97.5°F | Resp 18 | Ht 72.0 in | Wt 171.4 lb

## 2023-06-19 DIAGNOSIS — C342 Malignant neoplasm of middle lobe, bronchus or lung: Secondary | ICD-10-CM

## 2023-06-19 NOTE — Progress Notes (Signed)
 Radiation Oncology         [id number]) [phone] ________________________________  Name: Peter Sims MRN: [id number]  Date: 06/19/2023  DOB: 1950-08-03  Follow-Up Visit Note  Outpatient  CC: Cleave Curling, MD  Hilarie Lovely, MD  Diagnosis and prior treatment: Squamous cell carcinoma of middle lobe of right lung [facility])   Cancer Staging  Squamous cell carcinoma of middle lobe of right lung [facility]) Staging form: Lung, AJCC 8th Edition - Clinical stage from 02/22/2023: Stage IA3 (cT1c, cN0, cM0) - Signed by Colie Dawes, MD on 02/22/2023 Stage prefix: Initial diagnosis  Stage 1A3 (cT1c, N0, M0) Squamous cell carcinoma, NSCLC, of the right lung; s/p SBRT completed on 03/12/2023  ==========DELIVERED PLANS==========  First Treatment Date: 2023-03-07 Last Treatment Date: 2023-03-12   Plan Name: Lung_R_SBRT Site: Lung, Right Technique: SBRT/SRT-IMRT Mode: Photon Dose Per Fraction: 18 Gy Prescribed Dose (Delivered / Prescribed): 54 Gy / 54 Gy Prescribed Fxs (Delivered / Prescribed): 3 / 3   CHIEF COMPLAINT: Here for follow-up and surveillance of NSCLC. HE completed his radiation treatment on 03/12/2023  Interval Since Last Radiation:  4 months  Narrative:  The patient returns today for routine follow-up and to review most recent imaging.        CT of the chest on 06/04/2023 demonstrates an interval decrease in size of the regular central right middle lobe pulmonary nodule; stable appearance of the extrapleural soft tissue lesion in the anterior lateral right upper chest; a stable left upper lobe paramediastinal nodule; emphysema and aortic atherosclerosis; no new or progressive findings were visualized.  Symptomatically, the reports to be doing well overall.  Patient notes that he gets exhausted with walking but denies any shortness of breath.  He denies any changes to his breathing since he was last seen by us .   ALLERGIES:  is allergic to penicillins.  Meds: Current  Outpatient Medications  Medication Sig Dispense Refill   acetaminophen  (TYLENOL ) 325 MG tablet Take 2 tablets (650 mg total) by mouth every 4 (four) hours as needed for headache or mild pain.     albuterol  (PROVENTIL ) (2.5 MG/3ML) 0.083% nebulizer solution Take 3 mLs (2.5 mg total) by nebulization every 6 (six) hours as needed for wheezing or shortness of breath. 75 mL 2   albuterol  (VENTOLIN  HFA) 108 (90 Base) MCG/ACT inhaler INHALE 2 PUFFS INTO THE LUNGS EVERY 6 (SIX) HOURS AS NEEDED FOR WHEEZING OR SHORTNESS OF BREATH. 8.5 g 2   aspirin  81 MG chewable tablet Chew 1 tablet (81 mg total) by mouth daily.     atorvastatin  (LIPITOR ) 80 MG tablet Take 1 tablet (80 mg total) by mouth daily. 90 tablet 2   diclofenac  Sodium (VOLTAREN ) 1 % GEL Apply 2 g topically 2 (two) times daily as needed (joint pain). 100 g 2   famotidine  (PEPCID ) 40 MG tablet Take 1 tablet (40 mg total) by mouth 2 (two) times daily. 60 tablet 12   Fluticasone -Umeclidin-Vilant (TRELEGY ELLIPTA ) 100-62.5-25 MCG/ACT AEPB Inhale 1 puff into the lungs daily. 60 each 11   Fluticasone -Umeclidin-Vilant (TRELEGY ELLIPTA ) 100-62.5-25 MCG/ACT AEPB Inhale 1 puff into the lungs daily.     levofloxacin  (LEVAQUIN ) 500 MG tablet Take 1 tablet (500 mg total) by mouth daily. 10 tablet 0   methylPREDNISolone  (MEDROL  DOSEPAK) 4 MG TBPK tablet Take in tapering course as directed 6-5-4-3-2-1 21 tablet 0   metoprolol  succinate (TOPROL  XL) 25 MG 24 hr tablet Take 1/2 tablet (12.5 mg total) by mouth daily. 90 tablet 2   montelukast  (SINGULAIR )  10 MG tablet Take 1 tablet (10 mg total) by mouth every evening. (Patient taking differently: Take 10 mg by mouth daily as needed (Asthma).) 30 tablet 11   nicotine  polacrilex (NICORETTE ) 2 MG lozenge Use one lozenge every 2-3 hours as needed 72 tablet 0   nitroGLYCERIN  (NITROSTAT ) 0.4 MG SL tablet Place 1 tablet (0.4 mg total) under the tongue every 5 (five) minutes as needed for chest pain. 25 tablet 4   pantoprazole   (PROTONIX ) 40 MG tablet Take 1 tablet (40 mg total) by mouth daily. STOP OMEPRAZOLE  90 tablet 3   pregabalin  (LYRICA ) 50 MG capsule Take 1 capsule (50 mg total) by mouth 2 (two) times daily as needed. 180 capsule 0   Respiratory Therapy Supplies (FLUTTER) DEVI Use as directed 1 each 0   tamsulosin  (FLOMAX ) 0.4 MG CAPS capsule Take 2 capsules (0.8 mg total) by mouth at bedtime. Pt needs follow up for additional refills (Patient taking differently: Take 0.4 mg by mouth at bedtime.) 90 capsule 2   valsartan  (DIOVAN ) 160 MG tablet Take 1 tablet (160 mg total) by mouth daily. 90 tablet 2   No current facility-administered medications for this encounter.    Physical Findings: The patient is in no acute distress. Patient is alert and oriented.  height is 6' (1.829 m) and weight is 171 lb 6.4 oz (77.7 kg). His temperature is 97.5 F (36.4 C) (abnormal). His blood pressure is 102/73 and his pulse is 78. His respiration is 18 and oxygen  saturation is 97%. Aaron Aas  Heart is regular in rate and rhythm with no murmurs.  Lungs are clear to auscultation in all lung fields.  No palpable cervical, supraclavicular, or axillary adenopathy.  Lab Findings: Lab Results  Component Value Date   WBC 10.0 05/25/2023   HGB 16.0 05/25/2023   HCT 47.9 05/25/2023   MCV 87.6 05/25/2023   PLT 239 05/25/2023    @LASTCHEMISTRY @  Radiographic Findings: CT Chest Wo Contrast Result Date: 06/14/2023 CLINICAL DATA:  Non-small-cell lung cancer. Restaging. * Tracking Code: BO * EXAM: CT CHEST WITHOUT CONTRAST TECHNIQUE: Multidetector CT imaging of the chest was performed following the standard protocol without IV contrast. RADIATION DOSE REDUCTION: This exam was performed according to the departmental dose-optimization program which includes automated exposure control, adjustment of the mA and/or kV according to patient size and/or use of iterative reconstruction technique. COMPARISON:  01/08/2023 FINDINGS: Cardiovascular: The heart  size is normal. No substantial pericardial effusion. Coronary artery calcification is evident. Mild atherosclerotic calcification is noted in the wall of the thoracic aorta. Mediastinum/Nodes: No mediastinal lymphadenopathy. No evidence for gross hilar lymphadenopathy although assessment is limited by the lack of intravenous contrast on the current study. The esophagus has normal imaging features. There is no axillary lymphadenopathy. Lungs/Pleura: Centrilobular and paraseptal emphysema evident. Bullous changes noted in the lung apices. Previously characterized extrapleural soft tissue lesion in the anterolateral right upper chest is generally stable measuring 6.7 x 1.6 cm today compared to 6.6 x 1.1 cm previously. Irregular central right middle lobe pulmonary nodule has decreased in the interval measuring 1.0 x 0.7 cm today compared to 2.7 x 1.2 cm previously. Left upper lobe paramediastinal nodule measured previously at 1.8 x 1.0 cm is 1.7 x 1.1 cm today on 51/2. No new suspicious pulmonary nodule or mass. No focal airspace consolidation. No pleural effusion. Upper Abdomen: Visualized portion of the upper abdomen shows no acute findings. Stable nodular thickening of both adrenal glands. Musculoskeletal: No worrisome lytic or sclerotic osseous abnormality. IMPRESSION:  1. Interval decrease in size of the irregular central right middle lobe pulmonary nodule. 2. Stable appearance of the extrapleural soft tissue lesion in the anterolateral right upper chest. 3. Stable left upper lobe paramediastinal nodule. 4. No new or progressive findings. 5.  Emphysema (ICD10-J43.9) and Aortic Atherosclerosis (ICD10-170.0) Electronically Signed   By: Donnal Fusi M.D.   On: 06/14/2023 07:18    Impression/Plan:  Stage 1A3 (cT1c, N0, M0) Squamous cell carcinoma, NSCLC, of the right lung; s/p SBRT completed on 03/12/2023  Patient reports to be doing well today.  We personally reviewed his most recent imaging.  CT from 06/04/2023  demonstrates interval decrease in the size of the treated right middle lobe pulmonary nodule.  No evidence of disease recurrence or progression was noted.  Per NCCN guidelines, we will continue with serial imaging every 6 months.  Patient will continue this under the care of Dr. Liam Redhead. He is scheduled for his next image and follow-up appointment in October.  Radiation follow-up as needed.  We appreciate the opportunity to take part in this patient's care.  He was encouraged to call with any questions or concerns.  On date of service, in total, I spent 20 minutes on this encounter. Patient was seen in person.  _____________________________________    Julio Ohm, PA-C

## 2023-06-19 NOTE — Progress Notes (Signed)
 CT Chest ordered for 10/7 at 10:30 at [facility] long. I left a VM with for the pt with the details and my number to call back with any questions.  Pt did call be back shortly after receiving my message to confirm the appt. No questions at this time. Pt aware he can continue to reach out to me with any concerns.

## 2023-06-19 NOTE — Patient Instructions (Signed)
 Hypertension, Adult Hypertension is another name for high blood pressure. High blood pressure forces your heart to work harder to pump blood. This can cause problems over time. There are two numbers in a blood pressure reading. There is a top number (systolic) over a bottom number (diastolic). It is best to have a blood pressure that is below 120/80. What are the causes? The cause of this condition is not known. Some other conditions can lead to high blood pressure. What increases the risk? Some lifestyle factors can make you more likely to develop high blood pressure: Smoking. Not getting enough exercise or physical activity. Being overweight. Having too much fat, sugar, calories, or salt (sodium) in your diet. Drinking too much alcohol. Other risk factors include: Having any of these conditions: Heart disease. Diabetes. High cholesterol. Kidney disease. Obstructive sleep apnea. Having a family history of high blood pressure and high cholesterol. Age. The risk increases with age. Stress. What are the signs or symptoms? High blood pressure may not cause symptoms. Very high blood pressure (hypertensive crisis) may cause: Headache. Fast or uneven heartbeats (palpitations). Shortness of breath. Nosebleed. Vomiting or feeling like you may vomit (nauseous). Changes in how you see. Very bad chest pain. Feeling dizzy. Seizures. How is this treated? This condition is treated by making healthy lifestyle changes, such as: Eating healthy foods. Exercising more. Drinking less alcohol. Your doctor may prescribe medicine if lifestyle changes do not help enough and if: Your top number is above 130. Your bottom number is above 80. Your personal target blood pressure may vary. Follow these instructions at home: Eating and drinking  If told, follow the DASH eating plan. To follow this plan: Fill one half of your plate at each meal with fruits and vegetables. Fill one fourth of your plate  at each meal with whole grains. Whole grains include whole-wheat pasta, brown rice, and whole-grain bread. Eat or drink low-fat dairy products, such as skim milk or low-fat yogurt. Fill one fourth of your plate at each meal with low-fat (lean) proteins. Low-fat proteins include fish, chicken without skin, eggs, beans, and tofu. Avoid fatty meat, cured and processed meat, or chicken with skin. Avoid pre-made or processed food. Limit the amount of salt in your diet to less than 1,500 mg each day. Do not drink alcohol if: Your doctor tells you not to drink. You are pregnant, may be pregnant, or are planning to become pregnant. If you drink alcohol: Limit how much you have to: 0-1 drink a day for women. 0-2 drinks a day for men. Know how much alcohol is in your drink. In the [place] one drink equals one 12 oz bottle of beer (355 mL), one 5 oz glass of wine (148 mL), or one 1 oz glass of hard liquor (44 mL). Lifestyle  Work with your doctor to stay at a healthy weight or to lose weight. Ask your doctor what the best weight is for you. Get at least 30 minutes of exercise that causes your heart to beat faster (aerobic exercise) most days of the week. This may include walking, swimming, or biking. Get at least 30 minutes of exercise that strengthens your muscles (resistance exercise) at least 3 days a week. This may include lifting weights or doing Pilates. Do not smoke or use any products that contain nicotine or tobacco. If you need help quitting, ask your doctor. Check your blood pressure at home as told by your doctor. Keep all follow-up visits. Medicines Take over-the-counter and prescription medicines  only as told by your doctor. Follow directions carefully. Do not skip doses of blood pressure medicine. The medicine does not work as well if you skip doses. Skipping doses also puts you at risk for problems. Ask your doctor about side effects or reactions to medicines that you should watch  for. Contact a doctor if: You think you are having a reaction to the medicine you are taking. You have headaches that keep coming back. You feel dizzy. You have swelling in your ankles. You have trouble with your vision. Get help right away if: You get a very bad headache. You start to feel mixed up (confused). You feel weak or numb. You feel faint. You have very bad pain in your: Chest. Belly (abdomen). You vomit more than once. You have trouble breathing. These symptoms may be an emergency. Get help right away. Call 911. Do not wait to see if the symptoms will go away. Do not drive yourself to the hospital. Summary Hypertension is another name for high blood pressure. High blood pressure forces your heart to work harder to pump blood. For most people, a normal blood pressure is less than 120/80. Making healthy choices can help lower blood pressure. If your blood pressure does not get lower with healthy choices, you may need to take medicine. This information is not intended to replace advice given to you by your health care provider. Make sure you discuss any questions you have with your health care provider. Document Revised: 12/02/2020 Document Reviewed: 12/02/2020 Elsevier Patient Education  2024 [employer].

## 2023-06-19 NOTE — Progress Notes (Unsigned)
 I,Victoria T Basil Lim, CMA,acting as a Neurosurgeon for Smiley Dung, MD.,have documented all relevant documentation on the behalf of Smiley Dung, MD,as directed by  Smiley Dung, MD while in the presence of Smiley Dung, MD.  Subjective:  [name] , male    DOB: 02-27-1951 , 73 y.o.   MRN: [id number]  Chief Complaint  Patient presents with   Hypertension    Patient presents today for bp & abnormal glucose follow up. He reports compliance with medications. Denies headache, chest pain & sob. He has no specific questions or concerns.     HPI Discussed the use of AI scribe software for clinical note transcription with the patient, who gave verbal consent to proceed.  History of Present Illness Peter Sims is a 73 year old male with HTN, CAD, lung CA and aortic atherosclerosis who presents for a blood pressure check.  A recent CT scan showed a decrease in the size of a middle lobe pulmonary nodule. He experiences relief and happiness from being informed that he is currently cancer-free.  He experiences shortness of breath with walking, occurring after about one block or ten minutes. Previously, he walked seven blocks daily but stopped after his diagnosis. He resumed walking four weeks ago and is gradually increasing his distance.  His current medications include an albuterol  inhaler as needed, a nebulizer, aspirin , atorvastatin  80 mg daily, famotidine  or Pepcid  twice a day, Trelegy, montelukast  10 mg, prednisone , Levaquin , nitroglycerin  as needed, pantoprazole  in the morning, Lyrica  as needed, and valsartan  160 mg daily. He recently had a bronchitis flare treated with prednisone  and Levaquin .  He mentions significant weight loss down to 169 pounds due to recent health issues but notes gradual weight gain, currently at 172-173 pounds. He consumes Ensure daily to aid in weight gain and energy.   Hypertension This is a chronic problem. The current episode  started more than 1 year ago. The problem has been gradually improving since onset. The problem is controlled. Pertinent negatives include no blurred vision, chest pain, palpitations or shortness of breath. Risk factors for coronary artery disease include smoking/tobacco exposure and sedentary lifestyle. Past treatments include angiotensin blockers. The current treatment provides moderate improvement.     Past Medical History:  Diagnosis Date   Arthritis    Bullous emphysema (HCC)    Severe Bilateral   Cocaine abuse (HCC)    Colitis    with bleeding   COPD (chronic obstructive pulmonary disease) (HCC)    Coronary artery disease    Dyspnea    GERD (gastroesophageal reflux disease)    Headache    sinusitis   Hypertension    LBP (low back pain)    now chronic   Lumbago    Lung mass    Right Upper Lobe pleural based mass, negative  on PET Scan   Myocardial infarction [facility])    Neck pain    nerve pain    Neuromuscular disorder (HCC)    Neuropathy    right legf, calf, foot   Pneumonia    hx   S/P angioplasty with stent 01/13/20 emergent DES to mLAD  01/15/2020   Shortness of breath 03/03/2020   Spine pain    nerve   Therapeutic drug monitoring    Tobacco abuse      Family History  Adopted: Yes  Problem Relation Age of Onset   Cancer Father    Lung cancer Father    Heart failure Sister  Breast cancer Sister    Emphysema Mother      Current Outpatient Medications:    acetaminophen  (TYLENOL ) 325 MG tablet, Take 2 tablets (650 mg total) by mouth every 4 (four) hours as needed for headache or mild pain., Disp: , Rfl:    albuterol  (PROVENTIL ) (2.5 MG/3ML) 0.083% nebulizer solution, Take 3 mLs (2.5 mg total) by nebulization every 6 (six) hours as needed for wheezing or shortness of breath., Disp: 75 mL, Rfl: 2   aspirin  81 MG chewable tablet, Chew 1 tablet (81 mg total) by mouth daily., Disp: , Rfl:    atorvastatin  (LIPITOR ) 80 MG tablet, Take 1 tablet (80 mg total) by mouth  daily., Disp: 90 tablet, Rfl: 2   diclofenac  Sodium (VOLTAREN ) 1 % GEL, Apply 2 g topically 2 (two) times daily as needed (joint pain)., Disp: 100 g, Rfl: 2   Fluticasone -Umeclidin-Vilant (TRELEGY ELLIPTA ) 100-62.5-25 MCG/ACT AEPB, Inhale 1 puff into the lungs daily., Disp: 60 each, Rfl: 11   Fluticasone -Umeclidin-Vilant (TRELEGY ELLIPTA ) 100-62.5-25 MCG/ACT AEPB, Inhale 1 puff into the lungs daily., Disp: , Rfl:    metoprolol  succinate (TOPROL  XL) 25 MG 24 hr tablet, Take 1/2 tablet (12.5 mg total) by mouth daily., Disp: 90 tablet, Rfl: 2   nicotine  polacrilex (NICORETTE ) 2 MG lozenge, Use one lozenge every 2-3 hours as needed, Disp: 72 tablet, Rfl: 0   nitroGLYCERIN  (NITROSTAT ) 0.4 MG SL tablet, Place 1 tablet (0.4 mg total) under the tongue every 5 (five) minutes as needed for chest pain., Disp: 25 tablet, Rfl: 4   pantoprazole  (PROTONIX ) 40 MG tablet, Take 1 tablet (40 mg total) by mouth daily. STOP OMEPRAZOLE , Disp: 90 tablet, Rfl: 3   pregabalin  (LYRICA ) 50 MG capsule, Take 1 capsule (50 mg total) by mouth 2 (two) times daily as needed., Disp: 180 capsule, Rfl: 0   Respiratory Therapy Supplies (FLUTTER) DEVI, Use as directed, Disp: 1 each, Rfl: 0   tamsulosin  (FLOMAX ) 0.4 MG CAPS capsule, Take 2 capsules (0.8 mg total) by mouth at bedtime. Pt needs follow up for additional refills (Patient taking differently: Take 0.4 mg by mouth at bedtime.), Disp: 90 capsule, Rfl: 2   valsartan  (DIOVAN ) 160 MG tablet, Take 1 tablet (160 mg total) by mouth daily., Disp: 90 tablet, Rfl: 2   albuterol  (VENTOLIN  HFA) 108 (90 Base) MCG/ACT inhaler, INHALE 2 PUFFS INTO THE LUNGS EVERY 6 (SIX) HOURS AS NEEDED FOR WHEEZING OR SHORTNESS OF BREATH., Disp: 18 g, Rfl: 2   famotidine  (PEPCID ) 40 MG tablet, One tab po daily in the evenings, Disp: 90 tablet, Rfl: 2   montelukast  (SINGULAIR ) 10 MG tablet, Take 1 tablet (10 mg total) by mouth every evening., Disp: 30 tablet, Rfl: 11   Allergies  Allergen Reactions    Penicillins Anaphylaxis and Hives     Review of Systems  Constitutional: Negative.   HENT: Negative.    Eyes:  Negative for blurred vision.  Respiratory: Negative.  Negative for shortness of breath.   Cardiovascular: Negative.  Negative for chest pain and palpitations.  Gastrointestinal: Negative.   Endocrine: Negative.   Skin: Negative.   Allergic/Immunologic: Negative.   Neurological: Negative.   Hematological: Negative.      Today's Vitals   06/20/23 0847  BP: 120/82  Pulse: 70  Temp: 98.5 F (36.9 C)  SpO2: 98%  Weight: 173 lb 6.4 oz (78.7 kg)   Body mass index is 23.52 kg/m.  Wt Readings from Last 3 Encounters:  06/20/23 173 lb 6.4 oz (78.7 kg)  06/19/23 171 lb 6.4 oz (77.7 kg)  06/13/23 172 lb 4.8 oz (78.2 kg)     Objective:  Physical Exam Vitals and nursing note reviewed.  Constitutional:      Appearance: Normal appearance.  HENT:     Head: Normocephalic and atraumatic.  Eyes:     Extraocular Movements: Extraocular movements intact.  Cardiovascular:     Rate and Rhythm: Normal rate and regular rhythm.     Heart sounds: Normal heart sounds.  Pulmonary:     Effort: Pulmonary effort is normal.     Breath sounds: Normal breath sounds. No wheezing or rhonchi.  Musculoskeletal:     Cervical back: Normal range of motion.  Skin:    General: Skin is warm.  Neurological:     General: No focal deficit present.     Mental Status: He is alert.  Psychiatric:        Mood and Affect: Mood normal.         Assessment And Plan:  Hypertensive heart disease without heart failure Assessment & Plan: Chronic, controlled.  He will continue with Toprol  XL 25mg  daily and valsartan  160mg  daily. He is encouraged to follow a low sodium diet. He will f/u in four months for his physical exam.   Orders: -     Lipid panel  Aortic atherosclerosis (HCC) Assessment & Plan: Chronic, LDL goal < 70.  He will c/w ASA 81mg  daily, along with atorvastatin  80mg  daily. He is  encouraged to follow a heart healthy lifestyle.   Orders: -     Lipid panel  Coronary artery disease involving native coronary artery of native heart with unstable angina pectoris [facility]) Assessment & Plan: Chronic, LDL goal is less than 55. Cardiology input is appreciated. He will continue with ASA 81mg , atorvastatin  80mg  daily and metoprolol  XL 25mg  daily. He denies recent use of NTG. He is encouraged to gradually increase his daily activity.    Squamous cell carcinoma of middle lobe of right lung [facility]) Assessment & Plan: Most recent Rad Onc notes reviewed in full detail. CT from 06/04/2023 demonstrates interval decrease in the size of the treated right middle lobe pulmonary nodule. No evidence of disease recurrence or progression was noted. Per NCCN guidelines, we will continue with serial imaging every 6 months.  He will now be followed by Dr. Marguerita Shih every 6 months and will have repeat scans every six months.   COPD with chronic bronchitis (HCC) Assessment & Plan: Stable. He will continue with Breztri  to use 2 puffs twice daily. He was prescribed Trelegy by Pulmonary, but states he doesn't feel the powder is effective. He has no recent Pulmonary evaluation and no upcoming appts. He is encouraged to schedule an appt ASAP to discuss meds.    Other abnormal glucose Assessment & Plan: Previous labs reviewed, his A1c has been elevated in the past. I will check an A1c today. Reminded to avoid refined sugars including sugary drinks/foods and processed meats including bacon, sausages and deli meats.    Orders: -     Hemoglobin A1c  Other orders -     Albuterol  Sulfate HFA; INHALE 2 PUFFS INTO THE LUNGS EVERY 6 (SIX) HOURS AS NEEDED FOR WHEEZING OR SHORTNESS OF BREATH.  Dispense: 18 g; Refill: 2 -     Montelukast  Sodium; Take 1 tablet (10 mg total) by mouth every evening.  Dispense: 30 tablet; Refill: 11 -     Famotidine ; One tab po daily in the evenings  Dispense: 90 tablet; Refill:  2   Return for 4 month glucose f/u.Aaron Aas  Patient was given opportunity to ask questions. Patient verbalized understanding of the plan and was able to repeat key elements of the plan. All questions were answered to their satisfaction.   I, Smiley Dung, MD, have reviewed all documentation for this visit. The documentation on 06/20/23 for the exam, diagnosis, procedures, and orders are all accurate and complete.   IF YOU HAVE BEEN REFERRED TO A SPECIALIST, IT MAY TAKE 1-2 WEEKS TO SCHEDULE/PROCESS THE REFERRAL. IF YOU HAVE NOT HEARD FROM US /SPECIALIST IN TWO WEEKS, PLEASE GIVE US  A CALL AT [phone] X [phone].   THE PATIENT IS ENCOURAGED TO PRACTICE SOCIAL DISTANCING DUE TO THE COVID-19 PANDEMIC.

## 2023-06-20 ENCOUNTER — Other Ambulatory Visit (HOSPITAL_COMMUNITY): Payer: Self-pay

## 2023-06-20 ENCOUNTER — Ambulatory Visit: Payer: Self-pay | Admitting: Internal Medicine

## 2023-06-20 ENCOUNTER — Encounter: Payer: Self-pay | Admitting: Internal Medicine

## 2023-06-20 VITALS — BP 120/82 | HR 70 | Temp 98.5°F | Ht 72.0 in | Wt 173.4 lb

## 2023-06-20 DIAGNOSIS — R7309 Other abnormal glucose: Secondary | ICD-10-CM

## 2023-06-20 DIAGNOSIS — I700 Atherosclerosis of aorta: Secondary | ICD-10-CM

## 2023-06-20 DIAGNOSIS — J4489 Other specified chronic obstructive pulmonary disease: Secondary | ICD-10-CM | POA: Diagnosis not present

## 2023-06-20 DIAGNOSIS — I119 Hypertensive heart disease without heart failure: Secondary | ICD-10-CM

## 2023-06-20 DIAGNOSIS — I25110 Atherosclerotic heart disease of native coronary artery with unstable angina pectoris: Secondary | ICD-10-CM

## 2023-06-20 DIAGNOSIS — I479 Paroxysmal tachycardia, unspecified: Secondary | ICD-10-CM

## 2023-06-20 DIAGNOSIS — C342 Malignant neoplasm of middle lobe, bronchus or lung: Secondary | ICD-10-CM | POA: Diagnosis not present

## 2023-06-20 MED ORDER — ALBUTEROL SULFATE HFA 108 (90 BASE) MCG/ACT IN AERS
2.0000 | INHALATION_SPRAY | Freq: Four times a day (QID) | RESPIRATORY_TRACT | 2 refills | Status: AC | PRN
  Filled 2023-06-20: qty 6.7, 25d supply, fill #0

## 2023-06-20 MED ORDER — MONTELUKAST SODIUM 10 MG PO TABS
10.0000 mg | ORAL_TABLET | Freq: Every evening | ORAL | 11 refills | Status: AC
  Filled 2023-06-20: qty 30, 30d supply, fill #0

## 2023-06-20 MED ORDER — FAMOTIDINE 40 MG PO TABS
40.0000 mg | ORAL_TABLET | Freq: Every evening | ORAL | 2 refills | Status: AC
  Filled 2023-06-20: qty 90, 90d supply, fill #0

## 2023-06-20 NOTE — Assessment & Plan Note (Signed)
 Chronic, controlled.  He will continue with Toprol  XL 25mg  daily and valsartan  160mg  daily. He is encouraged to follow a low sodium diet. He will f/u in four months for his physical exam.

## 2023-06-20 NOTE — Assessment & Plan Note (Signed)
 Stable. He will continue with Breztri  to use 2 puffs twice daily. He was prescribed Trelegy by Pulmonary, but states he doesn't feel the powder is effective. He has no recent Pulmonary evaluation and no upcoming appts. He is encouraged to schedule an appt ASAP to discuss meds.

## 2023-06-20 NOTE — Assessment & Plan Note (Signed)
Chronic, LDL goal < 70.  He will c/w ASA 81mg  daily, along with atorvastatin 80mg  daily. He is encouraged to follow a heart healthy lifestyle.

## 2023-06-20 NOTE — Assessment & Plan Note (Signed)
 Chronic, LDL goal is less than 55. Cardiology input is appreciated. He will continue with ASA 81mg , atorvastatin  80mg  daily and metoprolol  XL 25mg  daily. He denies recent use of NTG. He is encouraged to gradually increase his daily activity.

## 2023-06-20 NOTE — Assessment & Plan Note (Signed)
 Most recent Rad Onc notes reviewed in full detail. CT from 06/04/2023 demonstrates interval decrease in the size of the treated right middle lobe pulmonary nodule. No evidence of disease recurrence or progression was noted. Per NCCN guidelines, we will continue with serial imaging every 6 months.  He will now be followed by Dr. Marguerita Shih every 6 months and will have repeat scans every six months.

## 2023-06-20 NOTE — Assessment & Plan Note (Signed)
 Previous labs reviewed, his A1c has been elevated in the past. I will check an A1c today. Reminded to avoid refined sugars including sugary drinks/foods and processed meats including bacon, sausages and deli meats.

## 2023-06-21 LAB — LIPID PANEL
Chol/HDL Ratio: 3.3 ratio (ref 0.0–5.0)
Cholesterol, Total: 203 mg/dL — ABNORMAL HIGH (ref 100–199)
HDL: 61 mg/dL (ref >39)
LDL Chol Calc (NIH): 126 mg/dL — ABNORMAL HIGH (ref 0–99)
Triglycerides: 90 mg/dL (ref 0–149)
VLDL Cholesterol Cal: 16 mg/dL (ref 5–40)

## 2023-06-21 LAB — HEMOGLOBIN A1C
Est. average glucose Bld gHb Est-mCnc: 140 mg/dL
Hgb A1c MFr Bld: 6.5 % — ABNORMAL HIGH (ref 4.8–5.6)

## 2023-06-25 ENCOUNTER — Encounter: Payer: Self-pay | Admitting: Internal Medicine

## 2023-07-25 ENCOUNTER — Telehealth: Payer: Self-pay | Admitting: Internal Medicine

## 2023-07-25 NOTE — Telephone Encounter (Signed)
 Copied from CRM [id number]. Topic: Clinical - Prescription Issue >> Jul 25, 2023  9:00 AM [employer] wrote: Reason for CRM: Patient stated Dr. Elnita Hai put him on Farxiga (dapagliflozin) and provided him with samples. She stated she would send prescription in to preferred pharmacy. Patient is currently out of samples.  Callback #: [id number]  Preferred Pharmacy:  [facility] [address] [place] [address] Phone: [phone] Fax: [phone] Hours: M-F 7:30am-6pm

## 2023-07-27 ENCOUNTER — Other Ambulatory Visit (HOSPITAL_COMMUNITY): Payer: Self-pay

## 2023-07-30 ENCOUNTER — Other Ambulatory Visit (HOSPITAL_COMMUNITY): Payer: Self-pay

## 2023-08-01 ENCOUNTER — Telehealth (HOSPITAL_BASED_OUTPATIENT_CLINIC_OR_DEPARTMENT_OTHER): Payer: Self-pay | Admitting: Cardiovascular Disease

## 2023-08-01 NOTE — Telephone Encounter (Signed)
 Called and spoke to patient. Patient verified with 2 identifiers (DOB and Last Name)  Patient states: Wife states he has a couple episodes of him nearly passing out and nausea when he gets excited/heightened emotions and he feels CP begins. Pt takes nitroglycerin  and sits down to steady himself.   Last night, he got excited/angry with brother and felt himself have a near syncope episode. Wife is concerns about this and would like follow up with MD/APP.  Nurse's Recommendations: Pt is due for a follow up. Pt is scheduled to see Otho Blitz, NP on 6/9 at 2:45p. Informed wife that radiation may also contribute to howe he feels and his weakness.

## 2023-08-01 NOTE — Telephone Encounter (Signed)
   Pt c/o of Chest Pain: STAT if active CP, including tightness, pressure, jaw pain, radiating pain to shoulder/upper arm/back, CP unrelieved by Nitro. Symptoms reported of SOB, nausea, vomiting, sweating.  1. Are you having CP right now? When patient gets excited , he gets chest pains- not at this time- he is out of town    2. Are you experiencing any other symptoms (ex. SOB, nausea, vomiting, sweating)? He feels nauseated when this happen   3. Is your CP continuous or coming and going? Comes and goes- it is happening quite frequently   4. Have you taken Nitroglycerin ? He did when it happen   5. How long have you been experiencing CP? For the last 3 months- patient's wife wants him to see Dr Theodis Fiscal    6. If NO CP at time of call then end call with telling Pt to call back or call 911 if Chest pain returns prior to return call from triage team.

## 2023-08-06 ENCOUNTER — Ambulatory Visit (HOSPITAL_BASED_OUTPATIENT_CLINIC_OR_DEPARTMENT_OTHER): Admitting: Family

## 2023-08-06 ENCOUNTER — Encounter (HOSPITAL_BASED_OUTPATIENT_CLINIC_OR_DEPARTMENT_OTHER): Payer: Self-pay

## 2023-08-22 ENCOUNTER — Other Ambulatory Visit (HOSPITAL_COMMUNITY): Payer: Self-pay

## 2023-08-28 ENCOUNTER — Ambulatory Visit (HOSPITAL_BASED_OUTPATIENT_CLINIC_OR_DEPARTMENT_OTHER): Admitting: Family

## 2023-08-28 ENCOUNTER — Other Ambulatory Visit (HOSPITAL_COMMUNITY): Payer: Self-pay

## 2023-08-28 ENCOUNTER — Encounter (HOSPITAL_BASED_OUTPATIENT_CLINIC_OR_DEPARTMENT_OTHER): Payer: Self-pay | Admitting: Family

## 2023-08-28 VITALS — BP 98/71 | HR 85 | Ht 72.0 in | Wt 165.0 lb

## 2023-08-28 DIAGNOSIS — I25118 Atherosclerotic heart disease of native coronary artery with other forms of angina pectoris: Secondary | ICD-10-CM | POA: Diagnosis not present

## 2023-08-28 DIAGNOSIS — I10 Essential (primary) hypertension: Secondary | ICD-10-CM

## 2023-08-28 DIAGNOSIS — E785 Hyperlipidemia, unspecified: Secondary | ICD-10-CM | POA: Diagnosis not present

## 2023-08-28 MED ORDER — VALSARTAN 80 MG PO TABS
80.0000 mg | ORAL_TABLET | Freq: Every day | ORAL | 1 refills | Status: AC
  Filled 2023-08-28: qty 90, 90d supply, fill #0
  Filled 2023-12-05: qty 90, 90d supply, fill #1

## 2023-08-28 MED ORDER — ROSUVASTATIN CALCIUM 10 MG PO TABS
10.0000 mg | ORAL_TABLET | Freq: Every day | ORAL | 3 refills | Status: AC
  Filled 2023-08-28: qty 90, 90d supply, fill #0
  Filled 2024-01-17: qty 90, 90d supply, fill #1

## 2023-08-28 NOTE — Progress Notes (Signed)
 Cardiology Office Note   Date:  08/28/2023  ID:  Perkins, Molina 09/21/1950, MRN [id number] PCP: [name], MD  [facility] Providers Cardiologist:  Annabella Scarce, MD     History of Present Illness Peter Sims is a 73 y.o. male  with a hx of CAD s/p anterior STEMI, hypertension, hyperlipidemia, tobacco use, pulm emphysema, COPD, GERD   ED visit 11/2019 precordial chest pain. Established with cardiology 12/2019 and recommended for cardiac CTA. Prior to completing, re-presented to ED wih anterior STEMI with PCI of mid LAD with otherwise mild to moderate lesion managed medically. Discharged on Metoprolol , Aspirin , Ticagrelor . Repeat ETT 04/2020 for chest pain unremarkable. Repeat LHC 11/2020 with patent LAD stent and otherwise nonobstructive disease unchanged from prior.    Seen 11/14/21 noting occasional episodes chest pain. Exercise myoview  ordered but not completed. BP not at goal and Losartan  50mg  added. He contacted the office noting itching with something crawling on him. Losartan  was discontinued. Metoprolol  succinate was continued. Saw PCP 01/03/22 and was started on Valsartan . Myoview  03/14/22 low risk study.   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  History of Present Illness He experiences chest pain radiating through to his back on the left side, similar to previous myocardial infarction episodes. The pain occurs approximately twice a month during physical activity and is relieved by nitroglycerin . It is associated with weakness, lightheadedness, and exhaustion, but not nausea or dyspnea. The frequency of chest pain has increased over the past couple of months.  He has experienced significant weight loss, decreasing from 173 pounds in April to 165 pounds currently, despite a good appetite and nutritional efforts. He is in contact with a  nutritionist and consumes Ensure daily, with a substantial dinner but inconsistent lunch.  He experiences itching with atorvastatin , leading to inconsistent use. His LDL cholesterol was 126 in April, increased from 72 a year ago. He takes Farxiga 5 mg daily, which has reduced sugar cravings, and valsartan  160 mg daily for hypertension, though he does not monitor his blood pressure at home.  ROS:  Please see the history of present illness.    All other systems reviewed and are negative.   Studies Reviewed      Cardiac Studies & Procedures   ______________________________________________________________________________________________ CARDIAC CATHETERIZATION  CARDIAC CATHETERIZATION 12/13/2020  Conclusion   Prox LAD lesion is 40% stenosed.   Prox Cx lesion is 40% stenosed.   Ost RCA to Prox RCA lesion is 30% stenosed.   2nd Mrg lesion is 50% stenosed.   Mid RCA lesion is 30% stenosed.   Non-stenotic Mid LAD lesion was previously treated.  Mild non-obstructive CAD with continued patency of the LAD stent Normal LVEDP  Suspect non-cardiac chest pain  Findings Coronary Findings Diagnostic  Dominance: Right  Left Main Vessel is normal in caliber. Vessel is angiographically normal. The left main is angiographically normal.  It divides into the LAD and left circumflex.  Left Anterior Descending The LAD is patent to the apex. The previously implanted stent is widely patent. Prox LAD lesion is 40% stenosed. Non-stenotic Mid LAD lesion was previously treated. The lesion is thrombotic.  Left Circumflex Mild non-obstructive is present in the circumflex. No significant change from prior study. Prox Cx lesion is 40% stenosed.  Second Obtuse Marginal Branch 2nd Mrg lesion is 50% stenosed.  Right Coronary Artery There is mild diffuse disease throughout  the vessel. Dominant vessel, supplies a PDA branch with no significant obstruction.  There is mild diffuse plaquing noted. No  high-grade obstruction. Ost RCA to Prox RCA lesion is 30% stenosed. Mid RCA lesion is 30% stenosed.  Intervention  No interventions have been documented.   CARDIAC CATHETERIZATION  CARDIAC CATHETERIZATION 01/14/2020  Conclusion 1.  Severe mid LAD stenosis (culprit lesion) treated successfully with PCI using a 3.5 x 16 mm Synergy DES 2.  Mild nonobstructive proximal LAD, mid circumflex, OM 2, and proximal RCA stenoses 3.  Normal LV systolic function with normal LVEDP  Recommendations: Aggressive medical therapy for secondary risk reduction.  Tobacco cessation.  The patient is loaded with ticagrelor  180 mg in the Cath Lab.  He is started on a high intensity statin drug and a beta-blocker.  Discussed cardiac catheterization findings with patient and his wife.  If no complications arise, should be eligible for hospital discharge within 24 hours.  Findings Coronary Findings Diagnostic  Dominance: Right  Left Main Vessel was injected. Vessel is normal in caliber. Vessel is angiographically normal. The left main is angiographically normal.  It divides into the LAD and left circumflex.  Left Anterior Descending Prox LAD lesion is 40% stenosed. Mid LAD lesion is 90% stenosed. The lesion is thrombotic.  Left Circumflex Prox Cx lesion is 40% stenosed.  Second Obtuse Marginal Branch 2nd Mrg lesion is 50% stenosed.  Right Coronary Artery There is mild diffuse disease throughout the vessel. Dominant vessel, supplies a PDA branch with no significant obstruction.  There is minor plaquing present. Ost RCA to Prox RCA lesion is 30% stenosed.  Intervention  Mid LAD lesion Stent CATH LAUNCHER 65F EBU3.5 guide catheter was inserted. Lesion crossed with guidewire using a WIRE COUGAR XT STRL 190CM. Pre-stent angioplasty was performed using a BALLOON SAPPHIRE 2.5X12. A drug-eluting stent was successfully placed using a SYNERGY XD 3.50X16. Post-stent angioplasty was performed using a BALLOON  SAPPHIRE Gardners 3.75X10. Maximum pressure:  16 atm. Post-Intervention Lesion Assessment The intervention was successful. Pre-interventional TIMI flow is 3. Post-intervention TIMI flow is 3. No complications occurred at this lesion. There is a 0% residual stenosis post intervention.   STRESS TESTS  MYOCARDIAL PERFUSION IMAGING 03/14/2022  Narrative   The study is normal. The study is low risk.   No ST deviation was noted.   Left ventricular function is normal. Nuclear stress EF: 57 %. The left ventricular ejection fraction is normal (55-65%). End diastolic cavity size is normal.   Prior study not available for comparison.            ______________________________________________________________________________________________      Risk Assessment/Calculations           Physical Exam VS:  BP 98/71 (BP Location: Right Arm, Patient Position: Sitting, Cuff Size: Normal)   Pulse 85   Ht 6' (1.829 m)   Wt 165 lb (74.8 kg)   SpO2 96%   BMI 22.38 kg/m        Wt Readings from Last 3 Encounters:  08/28/23 165 lb (74.8 kg)  06/20/23 173 lb 6.4 oz (78.7 kg)  06/19/23 171 lb 6.4 oz (77.7 kg)    GEN: Well nourished, well developed in no acute distress NECK: No JVD; No carotid bruits CARDIAC: RRR, no murmurs, rubs, gallops RESPIRATORY:  Clear to auscultation without rales, wheezing or rhonchi  ABDOMEN: Soft, non-tender, non-distended EXTREMITIES:  No edema; No deformity   ASSESSMENT AND PLAN    Assessment & Plan CAD / Chest pain with nitroglycerin   relief  Intermittent chest pain radiating to the left upper back, similar to previous myocardial infarction symptoms. Pain relieved by nitroglycerin , concerning for cardiac origin. EKG shows no new changes, but further investigation is warranted due to symptom similarity to past heart attack (anginal equivalent) - Schedule cardiac catheterization with Dr. Wonda on September 18, 2023. - Hold Doreen three days prior to cardiac  catheterization. - Instruct to call if symptoms worsen before the scheduled procedure.  Hypertension with low blood pressure Low blood pressure likely due to recent weight loss. Current valsartan  dose may be too high, contributing to lightheadedness and dizziness. - Reduce valsartan  dose to 80 mg daily. - Discussed to monitor BP at home at least 2 hours after medications and sitting for 5-10 minutes.   Hyperlipidemia with atorvastatin  intolerance Elevated LDL cholesterol at 126 mg/dL, above target. Atorvastatin  self discontinued due to severe itching. Plan to switch to a second-generation statin. - Prescribe rosuvastatin  10 mg daily. -lipid panel and LFT in2 months - Instruct to report any adverse reactions such as itching. If noted, consider PCSK9i vs bempedoic acid.   Unintentional weight loss Ongoing weight loss despite completion of lung cancer treatment and adequate appetite.  - Recheck thyroid  levels. - Add protein snack at lunchtime. - Consider referral to a [facility] nutritionist if weight loss persists. - Continue to follow with PCP.          Informed Consent   Shared Decision Making/Informed Consent The risks [stroke (1 in 1000), death (1 in 1000), kidney failure [usually temporary] (1 in 500), bleeding (1 in 200), allergic reaction [possibly serious] (1 in 200)], benefits (diagnostic support and management of coronary artery disease) and alternatives of a cardiac catheterization were discussed in detail with Mr. Hearn and he is willing to proceed.     Dispo: follow up 2-3 weeks after LHC  Signed, Reche GORMAN Finder, NP

## 2023-08-28 NOTE — H&P (View-Only) (Signed)
 Cardiology Office Note   Date:  08/28/2023  ID:  Perkins, Molina 09/21/1950, MRN [id number] PCP: [name], MD  [facility] Providers Cardiologist:  Annabella Scarce, MD     History of Present Illness Peter Sims is a 73 y.o. male  with a hx of CAD s/p anterior STEMI, hypertension, hyperlipidemia, tobacco use, pulm emphysema, COPD, GERD   ED visit 11/2019 precordial chest pain. Established with cardiology 12/2019 and recommended for cardiac CTA. Prior to completing, re-presented to ED wih anterior STEMI with PCI of mid LAD with otherwise mild to moderate lesion managed medically. Discharged on Metoprolol , Aspirin , Ticagrelor . Repeat ETT 04/2020 for chest pain unremarkable. Repeat LHC 11/2020 with patent LAD stent and otherwise nonobstructive disease unchanged from prior.    Seen 11/14/21 noting occasional episodes chest pain. Exercise myoview  ordered but not completed. BP not at goal and Losartan  50mg  added. He contacted the office noting itching with something crawling on him. Losartan  was discontinued. Metoprolol  succinate was continued. Saw PCP 01/03/22 and was started on Valsartan . Myoview  03/14/22 low risk study.   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  History of Present Illness He experiences chest pain radiating through to his back on the left side, similar to previous myocardial infarction episodes. The pain occurs approximately twice a month during physical activity and is relieved by nitroglycerin . It is associated with weakness, lightheadedness, and exhaustion, but not nausea or dyspnea. The frequency of chest pain has increased over the past couple of months.  He has experienced significant weight loss, decreasing from 173 pounds in April to 165 pounds currently, despite a good appetite and nutritional efforts. He is in contact with a  nutritionist and consumes Ensure daily, with a substantial dinner but inconsistent lunch.  He experiences itching with atorvastatin , leading to inconsistent use. His LDL cholesterol was 126 in April, increased from 72 a year ago. He takes Farxiga 5 mg daily, which has reduced sugar cravings, and valsartan  160 mg daily for hypertension, though he does not monitor his blood pressure at home.  ROS:  Please see the history of present illness.    All other systems reviewed and are negative.   Studies Reviewed      Cardiac Studies & Procedures   ______________________________________________________________________________________________ CARDIAC CATHETERIZATION  CARDIAC CATHETERIZATION 12/13/2020  Conclusion   Prox LAD lesion is 40% stenosed.   Prox Cx lesion is 40% stenosed.   Ost RCA to Prox RCA lesion is 30% stenosed.   2nd Mrg lesion is 50% stenosed.   Mid RCA lesion is 30% stenosed.   Non-stenotic Mid LAD lesion was previously treated.  Mild non-obstructive CAD with continued patency of the LAD stent Normal LVEDP  Suspect non-cardiac chest pain  Findings Coronary Findings Diagnostic  Dominance: Right  Left Main Vessel is normal in caliber. Vessel is angiographically normal. The left main is angiographically normal.  It divides into the LAD and left circumflex.  Left Anterior Descending The LAD is patent to the apex. The previously implanted stent is widely patent. Prox LAD lesion is 40% stenosed. Non-stenotic Mid LAD lesion was previously treated. The lesion is thrombotic.  Left Circumflex Mild non-obstructive is present in the circumflex. No significant change from prior study. Prox Cx lesion is 40% stenosed.  Second Obtuse Marginal Branch 2nd Mrg lesion is 50% stenosed.  Right Coronary Artery There is mild diffuse disease throughout  the vessel. Dominant vessel, supplies a PDA branch with no significant obstruction.  There is mild diffuse plaquing noted. No  high-grade obstruction. Ost RCA to Prox RCA lesion is 30% stenosed. Mid RCA lesion is 30% stenosed.  Intervention  No interventions have been documented.   CARDIAC CATHETERIZATION  CARDIAC CATHETERIZATION 01/14/2020  Conclusion 1.  Severe mid LAD stenosis (culprit lesion) treated successfully with PCI using a 3.5 x 16 mm Synergy DES 2.  Mild nonobstructive proximal LAD, mid circumflex, OM 2, and proximal RCA stenoses 3.  Normal LV systolic function with normal LVEDP  Recommendations: Aggressive medical therapy for secondary risk reduction.  Tobacco cessation.  The patient is loaded with ticagrelor  180 mg in the Cath Lab.  He is started on a high intensity statin drug and a beta-blocker.  Discussed cardiac catheterization findings with patient and his wife.  If no complications arise, should be eligible for hospital discharge within 24 hours.  Findings Coronary Findings Diagnostic  Dominance: Right  Left Main Vessel was injected. Vessel is normal in caliber. Vessel is angiographically normal. The left main is angiographically normal.  It divides into the LAD and left circumflex.  Left Anterior Descending Prox LAD lesion is 40% stenosed. Mid LAD lesion is 90% stenosed. The lesion is thrombotic.  Left Circumflex Prox Cx lesion is 40% stenosed.  Second Obtuse Marginal Branch 2nd Mrg lesion is 50% stenosed.  Right Coronary Artery There is mild diffuse disease throughout the vessel. Dominant vessel, supplies a PDA branch with no significant obstruction.  There is minor plaquing present. Ost RCA to Prox RCA lesion is 30% stenosed.  Intervention  Mid LAD lesion Stent CATH LAUNCHER 65F EBU3.5 guide catheter was inserted. Lesion crossed with guidewire using a WIRE COUGAR XT STRL 190CM. Pre-stent angioplasty was performed using a BALLOON SAPPHIRE 2.5X12. A drug-eluting stent was successfully placed using a SYNERGY XD 3.50X16. Post-stent angioplasty was performed using a BALLOON  SAPPHIRE Gardners 3.75X10. Maximum pressure:  16 atm. Post-Intervention Lesion Assessment The intervention was successful. Pre-interventional TIMI flow is 3. Post-intervention TIMI flow is 3. No complications occurred at this lesion. There is a 0% residual stenosis post intervention.   STRESS TESTS  MYOCARDIAL PERFUSION IMAGING 03/14/2022  Narrative   The study is normal. The study is low risk.   No ST deviation was noted.   Left ventricular function is normal. Nuclear stress EF: 57 %. The left ventricular ejection fraction is normal (55-65%). End diastolic cavity size is normal.   Prior study not available for comparison.            ______________________________________________________________________________________________      Risk Assessment/Calculations           Physical Exam VS:  BP 98/71 (BP Location: Right Arm, Patient Position: Sitting, Cuff Size: Normal)   Pulse 85   Ht 6' (1.829 m)   Wt 165 lb (74.8 kg)   SpO2 96%   BMI 22.38 kg/m        Wt Readings from Last 3 Encounters:  08/28/23 165 lb (74.8 kg)  06/20/23 173 lb 6.4 oz (78.7 kg)  06/19/23 171 lb 6.4 oz (77.7 kg)    GEN: Well nourished, well developed in no acute distress NECK: No JVD; No carotid bruits CARDIAC: RRR, no murmurs, rubs, gallops RESPIRATORY:  Clear to auscultation without rales, wheezing or rhonchi  ABDOMEN: Soft, non-tender, non-distended EXTREMITIES:  No edema; No deformity   ASSESSMENT AND PLAN    Assessment & Plan CAD / Chest pain with nitroglycerin   relief  Intermittent chest pain radiating to the left upper back, similar to previous myocardial infarction symptoms. Pain relieved by nitroglycerin , concerning for cardiac origin. EKG shows no new changes, but further investigation is warranted due to symptom similarity to past heart attack (anginal equivalent) - Schedule cardiac catheterization with Dr. Wonda on September 18, 2023. - Hold Doreen three days prior to cardiac  catheterization. - Instruct to call if symptoms worsen before the scheduled procedure.  Hypertension with low blood pressure Low blood pressure likely due to recent weight loss. Current valsartan  dose may be too high, contributing to lightheadedness and dizziness. - Reduce valsartan  dose to 80 mg daily. - Discussed to monitor BP at home at least 2 hours after medications and sitting for 5-10 minutes.   Hyperlipidemia with atorvastatin  intolerance Elevated LDL cholesterol at 126 mg/dL, above target. Atorvastatin  self discontinued due to severe itching. Plan to switch to a second-generation statin. - Prescribe rosuvastatin  10 mg daily. -lipid panel and LFT in2 months - Instruct to report any adverse reactions such as itching. If noted, consider PCSK9i vs bempedoic acid.   Unintentional weight loss Ongoing weight loss despite completion of lung cancer treatment and adequate appetite.  - Recheck thyroid  levels. - Add protein snack at lunchtime. - Consider referral to a [facility] nutritionist if weight loss persists. - Continue to follow with PCP.          Informed Consent   Shared Decision Making/Informed Consent The risks [stroke (1 in 1000), death (1 in 1000), kidney failure [usually temporary] (1 in 500), bleeding (1 in 200), allergic reaction [possibly serious] (1 in 200)], benefits (diagnostic support and management of coronary artery disease) and alternatives of a cardiac catheterization were discussed in detail with Mr. Hearn and he is willing to proceed.     Dispo: follow up 2-3 weeks after LHC  Signed, Reche GORMAN Finder, NP

## 2023-08-28 NOTE — Patient Instructions (Addendum)
 Medication Instructions:  STOP Atorvastatin   START Rosuvastatin 10mg  daily  CHANGE Valsartan  to 80mg  daily You may take a half tablet of your 160mg  tablet if you prefer   Lab Work:  Labs today: BMET, CBC, thyroid  panel  Your physician recommends that you return for lab work 2 months for LFT, lipid panel. Please fast for these labs.   Testing/Procedures: Your EKG today shows normal sinus rhythm and stable changes from previous  You are scheduled for a Cardiac Catheterization on Tuesday, July 22 with Dr. [name].  1. Please arrive at the [facility] (Main Entrance A) at [facility]: [address] [place], [place] [address] at 10:00 AM (This time is 2 hour(s) before your procedure to ensure your preparation).   Free valet parking service is available. You will check in at ADMITTING. The support person will be asked to wait in the waiting room.  It is OK to have someone drop you off and come back when you are ready to be discharged.    Special note: Every effort is made to have your procedure done on time. Please understand that emergencies sometimes delay scheduled procedures.  2. Diet: Do not eat solid foods after midnight.  The patient may have clear liquids until 5am upon the day of the procedure.  3. Labs: done at [facility] 08/28/2023  4. Medication instructions in preparation for your procedure:  On the morning of your procedure, take your Aspirin  81 mg and any morning medicines NOT listed above.  You may use sips of water.  5. Plan to go home the same day, you will only stay overnight if medically necessary. 6. Bring a current list of your medications and current insurance cards. 7. You MUST have a responsible person to drive you home. 8. Someone MUST be with you the first 24 hours after you arrive home or your discharge will be delayed. 9. Please wear clothes that are easy to get on and off and wear slip-on shoes.  Thank you for allowing us  to care for you!   --  [facility]   Follow-Up:  Your next appointment:   2 week(s) after cardiac catheterization with Dr. Raford or Reche GORMAN Finder, NP or Rosaline Bane, NP

## 2023-08-29 ENCOUNTER — Ambulatory Visit (HOSPITAL_BASED_OUTPATIENT_CLINIC_OR_DEPARTMENT_OTHER): Payer: Self-pay | Admitting: Family

## 2023-08-29 LAB — CBC
Hematocrit: 50.6 % (ref 37.5–51.0)
Hemoglobin: 16.2 g/dL (ref 13.0–17.7)
MCH: 29.1 pg (ref 26.6–33.0)
MCHC: 32 g/dL (ref 31.5–35.7)
MCV: 91 fL (ref 79–97)
Platelets: 224 10*3/uL (ref 150–450)
RBC: 5.56 x10E6/uL (ref 4.14–5.80)
RDW: 13.9 % (ref 11.6–15.4)
WBC: 7.4 10*3/uL (ref 3.4–10.8)

## 2023-08-29 LAB — THYROID PANEL WITH TSH
Free Thyroxine Index: 1.7 (ref 1.2–4.9)
T3 Uptake Ratio: 24 % (ref 24–39)
T4, Total: 7.2 ug/dL (ref 4.5–12.0)
TSH: 2.9 u[IU]/mL (ref 0.450–4.500)

## 2023-08-29 LAB — BASIC METABOLIC PANEL WITH GFR
BUN/Creatinine Ratio: 15 (ref 10–24)
BUN: 18 mg/dL (ref 8–27)
CO2: 19 mmol/L — ABNORMAL LOW (ref 20–29)
Calcium: 10.2 mg/dL (ref 8.6–10.2)
Chloride: 103 mmol/L (ref 96–106)
Creatinine, Ser: 1.19 mg/dL (ref 0.76–1.27)
Glucose: 86 mg/dL (ref 70–99)
Potassium: 4.6 mmol/L (ref 3.5–5.2)
Sodium: 139 mmol/L (ref 134–144)
eGFR: 65 mL/min/{1.73_m2} (ref >59)

## 2023-09-03 ENCOUNTER — Other Ambulatory Visit: Payer: Self-pay

## 2023-09-17 ENCOUNTER — Telehealth: Payer: Self-pay | Admitting: *Deleted

## 2023-09-17 NOTE — Telephone Encounter (Signed)
 Cardiac Catheterization scheduled at [facility] for: Tuesday September 18, 2023 12 Noon Arrival time [facility] Main Entrance A at: 10 AM  Nothing to eat after midnight prior to procedure, clear liquids until 5 AM day of procedure.  Medication instructions: -Hold:  Farxiga-AM of procedure  -Other usual morning medications can be taken with sips of water including aspirin  81 mg.  Plan to go home the same day, you will only stay overnight if medically necessary.  You must have responsible adult to drive you home.  Someone must be with you the first 24 hours after you arrive home.  Reviewed procedure instructions with patient.

## 2023-09-18 ENCOUNTER — Encounter (HOSPITAL_COMMUNITY)
Admission: RE | Disposition: A | Discharge status: Home / Self Care | Payer: Self-pay | Source: Home / Self Care | Attending: Cardiovascular Disease

## 2023-09-18 ENCOUNTER — Encounter (HOSPITAL_COMMUNITY): Payer: Self-pay | Admitting: Cardiovascular Disease

## 2023-09-18 ENCOUNTER — Other Ambulatory Visit: Payer: Self-pay

## 2023-09-18 ENCOUNTER — Ambulatory Visit (HOSPITAL_COMMUNITY)
Admission: RE | Admit: 2023-09-18 | Discharge: 2023-09-18 | Disposition: A | Discharge status: Home / Self Care | Attending: Cardiovascular Disease | Admitting: Cardiovascular Disease

## 2023-09-18 DIAGNOSIS — I2510 Atherosclerotic heart disease of native coronary artery without angina pectoris: Secondary | ICD-10-CM

## 2023-09-18 DIAGNOSIS — I252 Old myocardial infarction: Secondary | ICD-10-CM | POA: Diagnosis not present

## 2023-09-18 DIAGNOSIS — I25119 Atherosclerotic heart disease of native coronary artery with unspecified angina pectoris: Secondary | ICD-10-CM | POA: Diagnosis not present

## 2023-09-18 DIAGNOSIS — K219 Gastro-esophageal reflux disease without esophagitis: Secondary | ICD-10-CM | POA: Insufficient documentation

## 2023-09-18 DIAGNOSIS — R634 Abnormal weight loss: Secondary | ICD-10-CM | POA: Diagnosis not present

## 2023-09-18 DIAGNOSIS — Z6822 Body mass index (BMI) 22.0-22.9, adult: Secondary | ICD-10-CM | POA: Diagnosis not present

## 2023-09-18 DIAGNOSIS — J449 Chronic obstructive pulmonary disease, unspecified: Secondary | ICD-10-CM | POA: Insufficient documentation

## 2023-09-18 DIAGNOSIS — Z923 Personal history of irradiation: Secondary | ICD-10-CM | POA: Insufficient documentation

## 2023-09-18 DIAGNOSIS — Z7982 Long term (current) use of aspirin: Secondary | ICD-10-CM | POA: Diagnosis not present

## 2023-09-18 DIAGNOSIS — I10 Essential (primary) hypertension: Secondary | ICD-10-CM | POA: Diagnosis not present

## 2023-09-18 DIAGNOSIS — I25118 Atherosclerotic heart disease of native coronary artery with other forms of angina pectoris: Secondary | ICD-10-CM

## 2023-09-18 DIAGNOSIS — Z87891 Personal history of nicotine dependence: Secondary | ICD-10-CM | POA: Diagnosis not present

## 2023-09-18 DIAGNOSIS — Z955 Presence of coronary angioplasty implant and graft: Secondary | ICD-10-CM | POA: Insufficient documentation

## 2023-09-18 DIAGNOSIS — E785 Hyperlipidemia, unspecified: Secondary | ICD-10-CM | POA: Diagnosis not present

## 2023-09-18 DIAGNOSIS — Z79899 Other long term (current) drug therapy: Secondary | ICD-10-CM | POA: Diagnosis not present

## 2023-09-18 HISTORY — PX: LEFT HEART CATH AND CORONARY ANGIOGRAPHY: CATH118249

## 2023-09-18 SURGERY — LEFT HEART CATH AND CORONARY ANGIOGRAPHY
Anesthesia: LOCAL

## 2023-09-18 MED ORDER — SODIUM CHLORIDE 0.9% FLUSH: 3.0000 mL | INTRAVENOUS | Status: DC | PRN

## 2023-09-18 MED ORDER — SODIUM CHLORIDE 0.9% FLUSH: 3.0000 mL | Freq: Two times a day (BID) | INTRAVENOUS | Status: DC

## 2023-09-18 MED ORDER — SODIUM CHLORIDE 0.9 % IV SOLN
250.0000 mL | INTRAVENOUS | Status: DC | PRN
Start: 2023-09-18 — End: 2023-09-18

## 2023-09-18 MED ORDER — SODIUM CHLORIDE 0.9 % WEIGHT BASED INFUSION: 1.0000 mL/kg/h | INTRAVENOUS | Status: DC

## 2023-09-18 MED ORDER — MIDAZOLAM HCL 2 MG/2ML IJ SOLN
INTRAMUSCULAR | Status: AC
  Filled 2023-09-18: qty 2

## 2023-09-18 MED ORDER — SODIUM CHLORIDE 0.9 % WEIGHT BASED INFUSION: 3.0000 mL/kg/h | INTRAVENOUS | Status: AC

## 2023-09-18 MED ORDER — LIDOCAINE HCL (PF) 1 % IJ SOLN
INTRAMUSCULAR | Status: DC | PRN
  Administered 2023-09-18: 2 mL via INTRADERMAL

## 2023-09-18 MED ORDER — IOHEXOL 350 MG/ML SOLN
INTRAVENOUS | Status: DC | PRN
  Administered 2023-09-18: 35 mL

## 2023-09-18 MED ORDER — HYDRALAZINE HCL 20 MG/ML IJ SOLN: 10.0000 mg | INTRAMUSCULAR | Status: DC | PRN

## 2023-09-18 MED ORDER — ASPIRIN 81 MG PO CHEW
81.0000 mg | CHEWABLE_TABLET | ORAL | Status: AC
  Administered 2023-09-18: 81 mg via ORAL
  Filled 2023-09-18: qty 1

## 2023-09-18 MED ORDER — FENTANYL CITRATE (PF) 100 MCG/2ML IJ SOLN
INTRAMUSCULAR | Status: AC
Start: 2023-09-18 — End: 2023-09-18
  Filled 2023-09-18: qty 2

## 2023-09-18 MED ORDER — SODIUM CHLORIDE 0.9 % IV SOLN
INTRAVENOUS | Status: AC | PRN
  Administered 2023-09-18: 250 mL via INTRAVENOUS

## 2023-09-18 MED ORDER — HEPARIN (PORCINE) IN NACL 1000-0.9 UT/500ML-% IV SOLN
INTRAVENOUS | Status: DC | PRN
  Administered 2023-09-18: 1000 mL

## 2023-09-18 MED ORDER — HEPARIN (PORCINE) IN NACL 2-0.9 UNITS/ML
INTRAMUSCULAR | Status: DC | PRN
  Administered 2023-09-18: 10 mL via INTRA_ARTERIAL

## 2023-09-18 MED ORDER — VERAPAMIL HCL 2.5 MG/ML IV SOLN
INTRAVENOUS | Status: AC
Start: 2023-09-18 — End: 2023-09-18
  Filled 2023-09-18: qty 2

## 2023-09-18 MED ORDER — LIDOCAINE HCL (PF) 1 % IJ SOLN
INTRAMUSCULAR | Status: AC
  Filled 2023-09-18: qty 30

## 2023-09-18 MED ORDER — ONDANSETRON HCL 4 MG/2ML IJ SOLN: 4.0000 mg | Freq: Four times a day (QID) | INTRAMUSCULAR | Status: DC | PRN

## 2023-09-18 MED ORDER — SODIUM CHLORIDE 0.9 % IV SOLN: INTRAVENOUS | Status: DC

## 2023-09-18 MED ORDER — MIDAZOLAM HCL 2 MG/2ML IJ SOLN
INTRAMUSCULAR | Status: DC | PRN
  Administered 2023-09-18: 2 mg via INTRAVENOUS

## 2023-09-18 MED ORDER — HEPARIN SODIUM (PORCINE) 1000 UNIT/ML IJ SOLN
INTRAMUSCULAR | Status: DC | PRN
Start: 2023-09-18 — End: 2023-09-18
  Administered 2023-09-18: 4000 [IU] via INTRAVENOUS

## 2023-09-18 MED ORDER — FENTANYL CITRATE (PF) 100 MCG/2ML IJ SOLN
INTRAMUSCULAR | Status: DC | PRN
  Administered 2023-09-18: 25 ug via INTRAVENOUS

## 2023-09-18 MED ORDER — LABETALOL HCL 5 MG/ML IV SOLN: 10.0000 mg | INTRAVENOUS | Status: DC | PRN

## 2023-09-18 MED ORDER — HEPARIN SODIUM (PORCINE) 1000 UNIT/ML IJ SOLN
INTRAMUSCULAR | Status: AC
  Filled 2023-09-18: qty 10

## 2023-09-18 MED ORDER — ACETAMINOPHEN 325 MG PO TABS
650.0000 mg | ORAL_TABLET | ORAL | Status: DC | PRN
Start: 2023-09-18 — End: 2023-09-18

## 2023-09-18 SURGICAL SUPPLY — 7 items
CATH INFINITI 5FR MULTPACK ANG (CATHETERS) IMPLANT
GLIDESHEATH SLEND SS 6F .021 (SHEATH) IMPLANT
GUIDEWIRE INQWIRE 1.5J.035X260 (WIRE) IMPLANT
KIT SINGLE USE MANIFOLD (KITS) IMPLANT
KIT SYRINGE INJ CVI SPIKEX1 (MISCELLANEOUS) IMPLANT
PACK CARDIAC CATHETERIZATION (CUSTOM PROCEDURE TRAY) ×2 IMPLANT
SET ATX-X65L (MISCELLANEOUS) IMPLANT

## 2023-09-18 NOTE — Interval H&P Note (Signed)
 History and Physical Interval Note:  09/18/2023 2:31 PM  Peter Sims  has presented today for surgery, with the diagnosis of angina - cad.  The various methods of treatment have been discussed with the patient and family. After consideration of risks, benefits and other options for treatment, the patient has consented to  Procedure(s): LEFT HEART CATH AND CORONARY ANGIOGRAPHY (N/A) as a surgical intervention.  The patient's history has been reviewed, patient examined, no change in status, stable for surgery.  I have reviewed the patient's chart and labs.  Questions were answered to the patient's satisfaction.     [name]

## 2023-09-25 ENCOUNTER — Other Ambulatory Visit (HOSPITAL_COMMUNITY): Payer: Self-pay

## 2023-10-02 ENCOUNTER — Ambulatory Visit (HOSPITAL_BASED_OUTPATIENT_CLINIC_OR_DEPARTMENT_OTHER): Admitting: Family

## 2023-10-15 ENCOUNTER — Encounter: Payer: Self-pay | Admitting: Internal Medicine

## 2023-10-22 ENCOUNTER — Other Ambulatory Visit (HOSPITAL_COMMUNITY): Payer: Self-pay

## 2023-10-23 ENCOUNTER — Other Ambulatory Visit (HOSPITAL_COMMUNITY): Payer: Self-pay

## 2023-10-24 ENCOUNTER — Ambulatory Visit: Payer: Self-pay | Admitting: Internal Medicine

## 2023-10-24 ENCOUNTER — Ambulatory Visit (INDEPENDENT_AMBULATORY_CARE_PROVIDER_SITE_OTHER): Payer: Self-pay | Admitting: Internal Medicine

## 2023-10-24 ENCOUNTER — Encounter: Payer: Self-pay | Admitting: Internal Medicine

## 2023-10-24 VITALS — BP 110/70 | HR 98 | Temp 97.4°F | Ht 72.0 in | Wt 165.8 lb

## 2023-10-24 DIAGNOSIS — Z0000 Encounter for general adult medical examination without abnormal findings: Secondary | ICD-10-CM | POA: Diagnosis not present

## 2023-10-24 DIAGNOSIS — R3912 Poor urinary stream: Secondary | ICD-10-CM

## 2023-10-24 DIAGNOSIS — R630 Anorexia: Secondary | ICD-10-CM

## 2023-10-24 DIAGNOSIS — I25110 Atherosclerotic heart disease of native coronary artery with unstable angina pectoris: Secondary | ICD-10-CM | POA: Diagnosis not present

## 2023-10-24 DIAGNOSIS — I700 Atherosclerosis of aorta: Secondary | ICD-10-CM

## 2023-10-24 DIAGNOSIS — N401 Enlarged prostate with lower urinary tract symptoms: Secondary | ICD-10-CM | POA: Diagnosis not present

## 2023-10-24 DIAGNOSIS — C342 Malignant neoplasm of middle lobe, bronchus or lung: Secondary | ICD-10-CM | POA: Diagnosis not present

## 2023-10-24 DIAGNOSIS — E785 Hyperlipidemia, unspecified: Secondary | ICD-10-CM | POA: Diagnosis not present

## 2023-10-24 DIAGNOSIS — R7309 Other abnormal glucose: Secondary | ICD-10-CM

## 2023-10-24 DIAGNOSIS — Z720 Tobacco use: Secondary | ICD-10-CM

## 2023-10-24 DIAGNOSIS — I119 Hypertensive heart disease without heart failure: Secondary | ICD-10-CM

## 2023-10-24 DIAGNOSIS — R634 Abnormal weight loss: Secondary | ICD-10-CM | POA: Diagnosis not present

## 2023-10-24 DIAGNOSIS — E1169 Type 2 diabetes mellitus with other specified complication: Secondary | ICD-10-CM | POA: Diagnosis not present

## 2023-10-24 DIAGNOSIS — J4489 Other specified chronic obstructive pulmonary disease: Secondary | ICD-10-CM

## 2023-10-24 LAB — POCT URINALYSIS DIPSTICK
Bilirubin, UA: NEGATIVE
Blood, UA: NEGATIVE
Glucose, UA: POSITIVE — AB
Ketones, UA: NEGATIVE
Leukocytes, UA: NEGATIVE
Nitrite, UA: NEGATIVE
Protein, UA: NEGATIVE
Spec Grav, UA: <=1.005 — AB (ref 1.010–1.025)
Urobilinogen, UA: 0.2 U/dL
pH, UA: 6 (ref 5.0–8.0)

## 2023-10-24 NOTE — Assessment & Plan Note (Addendum)
 Chronic, controlled.  He will continue with Toprol  XL 25mg  daily and valsartan  160mg  daily. He is encouraged to follow a low sodium diet. He will f/u in four months for re-evaluation.

## 2023-10-24 NOTE — Progress Notes (Signed)
 I,Peter Sims, CMA,acting as a Neurosurgeon for Peter LOISE Slocumb, MD.,have documented all relevant documentation on the behalf of Peter LOISE Slocumb, MD,as directed by  Peter LOISE Slocumb, MD while in the presence of Peter LOISE Slocumb, MD.  Subjective:   [name] , male    DOB: 1950/09/20 , 73 y.o.   MRN: [id number]  Chief Complaint  Patient presents with   Annual Exam    Patient presents today for annual exam. He reports compliance with medications. Denies headache, chest pain & sob.  EKG completed on 07/1 with [facility].  He has a huge concern with not picking up weight. He eats two meals daily he also drinks ensure in between meals.  He also complains of a light wheeze, slight rattling in his chest.  Completes prostate exam with Urology.    Hypertension    HPI Discussed the use of AI scribe software for clinical note transcription with the patient, who gave verbal consent to proceed.  History of Present Illness Peter Sims is a 73 year old male with lung cancer who presents for a physical exam with concerns about weight loss and appetite.  He has experienced a 20-pound weight loss over the past year, which has not been regained despite efforts such as consuming Ensure and exercising. He eats two meals a day but lacks appetite and is not hungry at noon. He has cut out fried and processed foods.  He has a history of lung cancer, for which he underwent radiation therapy. Recent imaging in April showed tumor reduction, and he is scheduled for a follow-up CT scan next month. He reports no new lesions and no changes in his condition since the last procedure.  He is currently taking several medications, including valsartan  80 mg for blood pressure, rosuvastatin  10 mg for cholesterol, metoprolol , famotidine , Trelegy, and [place]. He has stopped taking Lyrica  as he no longer experiences the burning sensation it was prescribed for. He also uses an albuterol  inhaler as  needed.  He mentions feeling good overall, with improvements in lightheadedness and exhaustion since his blood pressure medication was adjusted. He experiences exertion-related fatigue during activities like yard work but stays hydrated by drinking seven to eight bottles of water a day.  He has a history of prostate issues and is considering changing his urologist. He has not been taking tamsulosin  regularly since March.  He continues to smoke. He exercises regularly, including walking and strength training. He denies ear pain and does not feel depressed or have mood issues. Reports exertion-related fatigue and a persistent cough.   Hypertension This is a chronic problem. The current episode started more than 1 year ago. The problem has been gradually improving since onset. The problem is controlled. Pertinent negatives include no blurred vision, chest pain, palpitations or shortness of breath. Risk factors for coronary artery disease include smoking/tobacco exposure and sedentary lifestyle. Past treatments include angiotensin blockers. The current treatment provides moderate improvement.  Diabetes He presents for his follow-up diabetic visit. He has type 2 diabetes mellitus. His disease course has been stable. There are no hypoglycemic associated symptoms. Pertinent negatives for diabetes include no blurred vision and no chest pain. There are no hypoglycemic complications. Symptoms are stable. Diabetic complications include heart disease. Risk factors for coronary artery disease include diabetes mellitus, dyslipidemia, male sex, hypertension and sedentary lifestyle. Current diabetic treatment includes oral agent (monotherapy). He participates in exercise intermittently. An ACE inhibitor/angiotensin II receptor blocker is being taken.  Past Medical History:  Diagnosis Date   Arthritis    Bullous emphysema (HCC)    Severe Bilateral   Cocaine abuse (HCC)    Colitis    with bleeding   COPD  (chronic obstructive pulmonary disease) (HCC)    Coronary artery disease    Dyslipidemia associated with type 2 diabetes mellitus (HCC) 06/23/2020   His a1c has been elevated in the past. I will recheck this today. He is encouraged to limit his intake of sweetened beverages, including diet drinks.    Dyspnea    GERD (gastroesophageal reflux disease)    Headache    sinusitis   Hypertension    LBP (low back pain)    now chronic   Lumbago    Lung mass    Right Upper Lobe pleural based mass, negative  on PET Scan   Myocardial infarction [facility])    Neck pain    nerve pain    Neuromuscular disorder (HCC)    Neuropathy    right legf, calf, foot   Pneumonia    hx   S/P angioplasty with stent 01/13/20 emergent DES to mLAD  01/15/2020   Shortness of breath 03/03/2020   Spine pain    nerve   Therapeutic drug monitoring    Tobacco abuse      Family History  Adopted: Yes  Problem Relation Age of Onset   Cancer Father    Lung cancer Father    Heart failure Sister    Breast cancer Sister    Emphysema Mother      Current Outpatient Medications:    acetaminophen  (TYLENOL ) 325 MG tablet, Take 2 tablets (650 mg total) by mouth every 4 (four) hours as needed for headache or mild pain. (Patient taking differently: Take 325 mg by mouth every 4 (four) hours as needed for headache or mild pain (pain score 1-3).), Disp: , Rfl:    albuterol  (PROVENTIL ) (2.5 MG/3ML) 0.083% nebulizer solution, Take 3 mLs (2.5 mg total) by nebulization every 6 (six) hours as needed for wheezing or shortness of breath., Disp: 75 mL, Rfl: 2   albuterol  (VENTOLIN  HFA) 108 (90 Base) MCG/ACT inhaler, Inhale 2 puffs into the lungs every 6 (six) hours as needed for wheezing or shortness of breath., Disp: 6.7 g, Rfl: 2   aspirin  81 MG chewable tablet, Chew 1 tablet (81 mg total) by mouth daily., Disp: , Rfl:    dapagliflozin propanediol (FARXIGA) 5 MG TABS tablet, Take 5 mg by mouth daily., Disp: , Rfl:    diclofenac  Sodium  (VOLTAREN  ARTHRITIS PAIN) 1 % GEL, Apply 2 g topically daily as needed (pain)., Disp: , Rfl:    famotidine  (PEPCID ) 40 MG tablet, Take 1 tablet (40 mg total) by mouth every evening., Disp: 90 tablet, Rfl: 2   Fluticasone -Umeclidin-Vilant (TRELEGY ELLIPTA ) 100-62.5-25 MCG/ACT AEPB, Inhale 1 puff into the lungs daily., Disp: 60 each, Rfl: 11   metoprolol  succinate (TOPROL  XL) 25 MG 24 hr tablet, Take 1/2 tablet (12.5 mg total) by mouth daily., Disp: 90 tablet, Rfl: 2   montelukast  (SINGULAIR ) 10 MG tablet, Take 1 tablet (10 mg total) by mouth every evening., Disp: 30 tablet, Rfl: 11   naphazoline-glycerin  (CLEAR EYES REDNESS) 0.012-0.25 % SOLN, Place 1-2 drops into both eyes 4 (four) times daily as needed (Dry eyes)., Disp: , Rfl:    nicotine  polacrilex (NICORETTE ) 2 MG lozenge, Use one lozenge every 2-3 hours as needed (Patient taking differently: Take 2 mg by mouth daily.), Disp: 72 tablet, Rfl: 0  nitroGLYCERIN  (NITROSTAT ) 0.4 MG SL tablet, Place 1 tablet (0.4 mg total) under the tongue every 5 (five) minutes as needed for chest pain., Disp: 25 tablet, Rfl: 4   Respiratory Therapy Supplies (FLUTTER) DEVI, Use as directed, Disp: 1 each, Rfl: 0   rosuvastatin  (CRESTOR ) 10 MG tablet, Take 1 tablet (10 mg total) by mouth daily., Disp: 90 tablet, Rfl: 3   tamsulosin  (FLOMAX ) 0.4 MG CAPS capsule, Take 2 capsules (0.8 mg total) by mouth at bedtime. Pt needs follow up for additional refills, Disp: 90 capsule, Rfl: 2   valsartan  (DIOVAN ) 80 MG tablet, Take 1 tablet (80 mg total) by mouth daily., Disp: 90 tablet, Rfl: 1   Allergies  Allergen Reactions   Penicillins Anaphylaxis and Hives     Men's preventive visit. Patient Health Questionnaire (PHQ-2) is  Flowsheet Row Office Visit from 10/24/2023 in [facility]  PHQ-2 Total Score 0  . Patient is on a heart healthy diet. Marital status: Married. Relevant history for alcohol  use is:  Social History   Substance and  Sexual Activity  Alcohol  Use Not Currently   Comment: scotch once a week  . Relevant history for tobacco use is:  Social History   Tobacco Use  Smoking Status Every Day   Current packs/day: 0.25   Average packs/day: 0.3 packs/day for 45.0 years (11.3 ttl pk-yrs)   Types: Cigarettes   Passive exposure: Current  Smokeless Tobacco Never  Tobacco Comments   Pt states that he smoke 5 ciggs a day as of 03/08/2022 LW   Pt chewing nicorette  gum as of  03/08/2022 LW   04/22/20 Patient given 1-800-quit-now  and the link to [facility] health virtual smoking cessation link      Pt is smoking 10 ciggs daily as of 02/03/2022 LW  .   Review of Systems  Constitutional: Negative.   Eyes:  Negative for blurred vision.  Respiratory: Negative.  Negative for shortness of breath.   Cardiovascular: Negative.  Negative for chest pain and palpitations.  Gastrointestinal: Negative.   Neurological: Negative.   Psychiatric/Behavioral: Negative.       Today's Vitals   10/24/23 0944  BP: 110/70  Pulse: 98  Temp: (!) 97.4 F (36.3 C)  SpO2: 98%  Weight: 165 lb 12.8 oz (75.2 kg)  Height: 6' (1.829 m)   Body mass index is 22.49 kg/m.  Wt Readings from Last 3 Encounters:  10/24/23 165 lb 12.8 oz (75.2 kg)  09/18/23 165 lb (74.8 kg)  08/28/23 165 lb (74.8 kg)    Objective:  Physical Exam Constitutional:      Appearance: Normal appearance.  HENT:     Head: Normocephalic.     Right Ear: Tympanic membrane, ear canal and external ear normal. There is no impacted cerumen.     Left Ear: Tympanic membrane, ear canal and external ear normal. There is no impacted cerumen.     Nose: Nose normal.     Mouth/Throat:     Pharynx: No oropharyngeal exudate or posterior oropharyngeal erythema.  Eyes:     Extraocular Movements: Extraocular movements intact.     Pupils: Pupils are equal, round, and reactive to light.  Cardiovascular:     Rate and Rhythm: Normal rate.     Pulses:          Dorsalis pedis pulses  are 1+ on the right side and 1+ on the left side.  Pulmonary:     Comments: wheezing was noted on expiration Abdominal:  General: Bowel sounds are normal.     Palpations: Abdomen is soft.  Genitourinary:    Comments: Deferred  Musculoskeletal:        General: Normal range of motion.     Cervical back: Normal range of motion and neck supple.  Feet:     Right foot:     Protective Sensation: 5 sites tested.  5 sites sensed.     Skin integrity: Dry skin present.     Toenail Condition: Right toenails are long.     Left foot:     Protective Sensation: 5 sites tested.  5 sites sensed.     Skin integrity: Dry skin present.     Toenail Condition: Left toenails are long.  Skin:    General: Skin is warm.  Neurological:     General: No focal deficit present.     Mental Status: He is alert and oriented to person, place, and time.  Psychiatric:        Mood and Affect: Mood normal.         Assessment And Plan:    Encounter for annual health examination Assessment & Plan: A full exam was performed.  DRE deferred.  He is advised to get 30-45 minutes of regular exercise, no less than four to five days per week. Both weight-bearing and aerobic exercises are recommended.  He is advised to follow a healthy diet with at least six fruits/veggies per day, decrease intake of red meat and other saturated fats and to increase fish intake to twice weekly.  Meats/fish should not be fried -- baked, boiled or broiled is preferable. It is also important to cut back on your sugar intake.  Be sure to read labels - try to avoid anything with added sugar, high fructose corn syrup or other sweeteners.  If you must use a sweetener, you can try stevia or monkfruit.  It is also important to avoid artificially sweetened foods/beverages and diet drinks. Lastly, wear SPF 50 sunscreen on exposed skin and when in direct sunlight for an extended period of time.  Be sure to avoid fast food restaurants and aim for at least 60  ounces of water daily.      Orders: -     Lipid panel -     PSA -     Hepatic function panel  Hypertensive heart disease without heart failure Assessment & Plan: Chronic, controlled.  He will continue with Toprol  XL 25mg  daily and valsartan  160mg  daily. He is encouraged to follow a low sodium diet. He will f/u in four months for re-evaluation.   Orders: -     POCT urinalysis dipstick -     Microalbumin / creatinine urine ratio  Coronary artery disease involving native coronary artery of native heart with unstable angina pectoris (HCC) Assessment & Plan: Chronic, LDL goal is less than 55. Cardiology input is appreciated. He will continue with ASA 81mg , atorvastatin  80mg  daily and metoprolol  XL 25mg  daily. He denies recent use of NTG. He is encouraged to gradually increase his daily activity.    Aortic atherosclerosis (HCC) Assessment & Plan: Chronic, LDL goal < 70.  He will c/w ASA 81mg  daily, along with atorvastatin  80mg  daily. He is encouraged to follow a heart healthy lifestyle.    Dyslipidemia associated with type 2 diabetes mellitus (HCC) Assessment & Plan: Newly diagnosed. Diabetic foot exam was performed.  He has been taking Farxiga without any GU symptoms.  Doreen may contribute to calorie loss. Discussed need to replace  lost calories.  LDL cholesterol at 126, above target. On rosuvastatin  10 mg due to atorvastatin  intolerance. - Recheck cholesterol levels. - Recheck A1c to assess [place] effectiveness. I DISCUSSED WITH THE PATIENT AT LENGTH REGARDING THE GOALS OF GLYCEMIC CONTROL AND POSSIBLE LONG-TERM COMPLICATIONS.  I  ALSO STRESSED THE IMPORTANCE OF COMPLIANCE WITH HOME GLUCOSE MONITORING, DIETARY RESTRICTIONS INCLUDING AVOIDANCE OF SUGARY DRINKS/PROCESSED FOODS,  ALONG WITH REGULAR EXERCISE.  I  ALSO STRESSED THE IMPORTANCE OF ANNUAL EYE EXAMS, SELF FOOT CARE AND COMPLIANCE WITH OFFICE VISITS.   Orders: -     Hemoglobin A1c  Squamous cell carcinoma of middle lobe of  right lung (HCC) Assessment & Plan: Stage I lung cancer treated with radiation. Recent imaging showed tumor reduction, no chemotherapy needed. - Continue surveillance with oncologist. - Schedule CT scan for follow-up.   Benign prostatic hyperplasia with weak urinary stream Assessment & Plan: Managed with tamsulosin , not filled since March. Plans to change urologist. - Order blood work for prostate evaluation. - Refer to [facility] for new urologist.  Orders: -     Ambulatory referral to Urology  Unintentional weight loss Assessment & Plan: Significant weight loss with muscle mass loss and temporal wasting. Discussed dietary planning and role of healthy fats and proteins. Considered appetite-stimulating medication after nutritionist consultation. - Refer to nutritionist for meal planning. - Order prealbumin level test. - Provide Ensure or Boost samples. - Consider appetite-stimulating medication after reviewing interactions.   Decreased appetite -     Prealbumin  COPD with chronic bronchitis (HCC) Assessment & Plan: Pulmonary input is appreciated. Managed with Trelegy and albuterol . - Refill Trelegy prescription.   Tobacco use Assessment & Plan: Continued tobacco use despite previous advice. Discussed risks related to lung cancer and cardiovascular health. - Encourage smoking cessation.    Return in 4 weeks (on 11/21/2023), or flu shot - [place], for 1 year physical, 4 month bp check. Patient was given opportunity to ask questions. Patient verbalized understanding of the plan and was able to repeat key elements of the plan. All questions were answered to their satisfaction.   I, Peter LOISE Slocumb, MD, have reviewed all documentation for this visit. The documentation on 10/24/23 for the exam, diagnosis, procedures, and orders are all accurate and complete.

## 2023-10-24 NOTE — Assessment & Plan Note (Signed)
 Chronic, LDL goal is less than 55. Cardiology input is appreciated. He will continue with ASA 81mg , atorvastatin  80mg  daily and metoprolol  XL 25mg  daily. He denies recent use of NTG. He is encouraged to gradually increase his daily activity.

## 2023-10-24 NOTE — Patient Instructions (Signed)
 Health Maintenance, Male  Adopting a healthy lifestyle and getting preventive care are important in promoting health and wellness. Ask your health care provider about:  The right schedule for you to have regular tests and exams.  Things you can do on your own to prevent diseases and keep yourself healthy.  What should I know about diet, weight, and exercise?  Eat a healthy diet    Eat a diet that includes plenty of vegetables, fruits, low-fat dairy products, and lean protein.  Do not eat a lot of foods that are high in solid fats, added sugars, or sodium.  Maintain a healthy weight  Body mass index (BMI) is a measurement that can be used to identify possible weight problems. It estimates body fat based on height and weight. Your health care provider can help determine your BMI and help you achieve or maintain a healthy weight.  Get regular exercise  Get regular exercise. This is one of the most important things you can do for your health. Most adults should:  Exercise for at least 150 minutes each week. The exercise should increase your heart rate and make you sweat (moderate-intensity exercise).  Do strengthening exercises at least twice a week. This is in addition to the moderate-intensity exercise.  Spend less time sitting. Even light physical activity can be beneficial.  Watch cholesterol and blood lipids  Have your blood tested for lipids and cholesterol at 71 years of age, then have this test every 5 years.  You may need to have your cholesterol levels checked more often if:  Your lipid or cholesterol levels are high.  You are older than 71 years of age.  You are at high risk for heart disease.  What should I know about cancer screening?  Many types of cancers can be detected early and may often be prevented. Depending on your health history and family history, you may need to have cancer screening at various ages. This may include screening for:  Colorectal cancer.  Prostate cancer.  Skin cancer.  Lung  cancer.  What should I know about heart disease, diabetes, and high blood pressure?  Blood pressure and heart disease  High blood pressure causes heart disease and increases the risk of stroke. This is more likely to develop in people who have high blood pressure readings or are overweight.  Talk with your health care provider about your target blood pressure readings.  Have your blood pressure checked:  Every 3-5 years if you are 24-52 years of age.  Every year if you are 3 years old or older.  If you are between the ages of 60 and 72 and are a current or former smoker, ask your health care provider if you should have a one-time screening for abdominal aortic aneurysm (AAA).  Diabetes  Have regular diabetes screenings. This checks your fasting blood sugar level. Have the screening done:  Once every three years after age 66 if you are at a normal weight and have a low risk for diabetes.  More often and at a younger age if you are overweight or have a high risk for diabetes.  What should I know about preventing infection?  Hepatitis B  If you have a higher risk for hepatitis B, you should be screened for this virus. Talk with your health care provider to find out if you are at risk for hepatitis B infection.  Hepatitis C  Blood testing is recommended for:  Everyone born from [birth year] through 1963.  Anyone  with known risk factors for hepatitis C.  Sexually transmitted infections (STIs)  You should be screened each year for STIs, including gonorrhea and chlamydia, if:  You are sexually active and are younger than 71 years of age.  You are older than 71 years of age and your health care provider tells you that you are at risk for this type of infection.  Your sexual activity has changed since you were last screened, and you are at increased risk for chlamydia or gonorrhea. Ask your health care provider if you are at risk.  Ask your health care provider about whether you are at high risk for HIV. Your health care provider  may recommend a prescription medicine to help prevent HIV infection. If you choose to take medicine to prevent HIV, you should first get tested for HIV. You should then be tested every 3 months for as long as you are taking the medicine.  Follow these instructions at home:  Alcohol use  Do not drink alcohol if your health care provider tells you not to drink.  If you drink alcohol:  Limit how much you have to 0-2 drinks a day.  Know how much alcohol is in your drink. In the [place] one drink equals one 12 oz bottle of beer (355 mL), one 5 oz glass of wine (148 mL), or one 1 oz glass of hard liquor (44 mL).  Lifestyle  Do not use any products that contain nicotine or tobacco. These products include cigarettes, chewing tobacco, and vaping devices, such as e-cigarettes. If you need help quitting, ask your health care provider.  Do not use street drugs.  Do not share needles.  Ask your health care provider for help if you need support or information about quitting drugs.  General instructions  Schedule regular health, dental, and eye exams.  Stay current with your vaccines.  Tell your health care provider if:  You often feel depressed.  You have ever been abused or do not feel safe at home.  Summary  Adopting a healthy lifestyle and getting preventive care are important in promoting health and wellness.  Follow your health care provider's instructions about healthy diet, exercising, and getting tested or screened for diseases.  Follow your health care provider's instructions on monitoring your cholesterol and blood pressure.  This information is not intended to replace advice given to you by your health care provider. Make sure you discuss any questions you have with your health care provider.  Document Revised: 07/05/2020 Document Reviewed: 07/05/2020  Elsevier Patient Education  2024 [employer].

## 2023-10-24 NOTE — Assessment & Plan Note (Signed)
Chronic, LDL goal < 70.  He will c/w ASA 81mg  daily, along with atorvastatin 80mg  daily. He is encouraged to follow a heart healthy lifestyle.

## 2023-10-25 ENCOUNTER — Other Ambulatory Visit (HOSPITAL_COMMUNITY): Payer: Self-pay

## 2023-10-25 LAB — LIPID PANEL
Chol/HDL Ratio: 2.9 ratio (ref 0.0–5.0)
Cholesterol, Total: 150 mg/dL (ref 100–199)
HDL: 52 mg/dL (ref >39)
LDL Chol Calc (NIH): 85 mg/dL (ref 0–99)
Triglycerides: 65 mg/dL (ref 0–149)
VLDL Cholesterol Cal: 13 mg/dL (ref 5–40)

## 2023-10-25 LAB — MICROALBUMIN / CREATININE URINE RATIO
Creatinine, Urine: 65.3 mg/dL
Microalb/Creat Ratio: <5 mg/g{creat} (ref 0–29)
Microalbumin, Urine: <3 ug/mL

## 2023-10-25 LAB — HEPATIC FUNCTION PANEL
ALT: 24 IU/L (ref 0–44)
AST: 21 IU/L (ref 0–40)
Albumin: 4.4 g/dL (ref 3.8–4.8)
Alkaline Phosphatase: 123 IU/L — ABNORMAL HIGH (ref 44–121)
Bilirubin Total: 0.7 mg/dL (ref 0.0–1.2)
Bilirubin, Direct: 0.23 mg/dL (ref 0.00–0.40)
Total Protein: 7.3 g/dL (ref 6.0–8.5)

## 2023-10-25 LAB — PREALBUMIN: PREALBUMIN: 23 mg/dL (ref 9–32)

## 2023-10-25 LAB — HEMOGLOBIN A1C
Est. average glucose Bld gHb Est-mCnc: 140 mg/dL
Hgb A1c MFr Bld: 6.5 % — ABNORMAL HIGH (ref 4.8–5.6)

## 2023-10-25 LAB — PSA: Prostate Specific Ag, Serum: 1 ng/mL (ref 0.0–4.0)

## 2023-11-03 ENCOUNTER — Encounter: Payer: Self-pay | Admitting: Internal Medicine

## 2023-11-03 DIAGNOSIS — Z0000 Encounter for general adult medical examination without abnormal findings: Secondary | ICD-10-CM | POA: Insufficient documentation

## 2023-11-03 DIAGNOSIS — N400 Enlarged prostate without lower urinary tract symptoms: Secondary | ICD-10-CM | POA: Insufficient documentation

## 2023-11-03 NOTE — Assessment & Plan Note (Signed)
 Stage I lung cancer treated with radiation. Recent imaging showed tumor reduction, no chemotherapy needed. - Continue surveillance with oncologist. - Schedule CT scan for follow-up.

## 2023-11-03 NOTE — Assessment & Plan Note (Signed)
 Pulmonary input is appreciated. Managed with Trelegy and albuterol . - Refill Trelegy prescription.

## 2023-11-03 NOTE — Assessment & Plan Note (Signed)
 Continued tobacco use despite previous advice. Discussed risks related to lung cancer and cardiovascular health. - Encourage smoking cessation.

## 2023-11-03 NOTE — Assessment & Plan Note (Addendum)
 Newly diagnosed. Diabetic foot exam was performed.  He has been taking Farxiga without any GU symptoms.  Doreen may contribute to calorie loss. Discussed need to replace lost calories.  LDL cholesterol at 126, above target. On rosuvastatin  10 mg due to atorvastatin  intolerance. - Recheck cholesterol levels. - Recheck A1c to assess [place] effectiveness. I DISCUSSED WITH THE PATIENT AT LENGTH REGARDING THE GOALS OF GLYCEMIC CONTROL AND POSSIBLE LONG-TERM COMPLICATIONS.  I  ALSO STRESSED THE IMPORTANCE OF COMPLIANCE WITH HOME GLUCOSE MONITORING, DIETARY RESTRICTIONS INCLUDING AVOIDANCE OF SUGARY DRINKS/PROCESSED FOODS,  ALONG WITH REGULAR EXERCISE.  I  ALSO STRESSED THE IMPORTANCE OF ANNUAL EYE EXAMS, SELF FOOT CARE AND COMPLIANCE WITH OFFICE VISITS.

## 2023-11-03 NOTE — Assessment & Plan Note (Signed)
 Managed with tamsulosin , not filled since March. Plans to change urologist. - Order blood work for prostate evaluation. - Refer to [facility] for new urologist.

## 2023-11-03 NOTE — Assessment & Plan Note (Signed)
 Significant weight loss with muscle mass loss and temporal wasting. Discussed dietary planning and role of healthy fats and proteins. Considered appetite-stimulating medication after nutritionist consultation. - Refer to nutritionist for meal planning. - Order prealbumin level test. - Provide Ensure or Boost samples. - Consider appetite-stimulating medication after reviewing interactions.

## 2023-11-03 NOTE — Assessment & Plan Note (Signed)
A full exam was performed.  DRE deferred.  He is advised to get 30-45 minutes of regular exercise, no less than four to five days per week. Both weight-bearing and aerobic exercises are recommended.  He is advised to follow a healthy diet with at least six fruits/veggies per day, decrease intake of red meat and other saturated fats and to increase fish intake to twice weekly.  Meats/fish should not be fried -- baked, boiled or broiled is preferable. It is also important to cut back on your sugar intake.  Be sure to read labels - try to avoid anything with added sugar, high fructose corn syrup or other sweeteners.  If you must use a sweetener, you can try stevia or monkfruit.  It is also important to avoid artificially sweetened foods/beverages and diet drinks. Lastly, wear SPF 50 sunscreen on exposed skin and when in direct sunlight for an extended period of time.  Be sure to avoid fast food restaurants and aim for at least 60 ounces of water daily.

## 2023-11-08 ENCOUNTER — Other Ambulatory Visit (HOSPITAL_COMMUNITY): Payer: Self-pay

## 2023-11-08 ENCOUNTER — Other Ambulatory Visit: Payer: Self-pay | Admitting: Internal Medicine

## 2023-11-08 MED ORDER — TAMSULOSIN HCL 0.4 MG PO CAPS
0.8000 mg | ORAL_CAPSULE | Freq: Every day | ORAL | 0 refills | Status: AC
  Filled 2023-11-08: qty 180, 90d supply, fill #0

## 2023-11-08 MED ORDER — TAMSULOSIN HCL 0.4 MG PO CAPS
0.8000 mg | ORAL_CAPSULE | Freq: Every day | ORAL | 2 refills | Status: AC
  Filled 2023-11-08: qty 90, 45d supply, fill #0

## 2023-11-20 ENCOUNTER — Ambulatory Visit (INDEPENDENT_AMBULATORY_CARE_PROVIDER_SITE_OTHER)

## 2023-11-20 VITALS — BP 110/80 | HR 86 | Temp 98.1°F | Ht 72.0 in | Wt 165.0 lb

## 2023-11-20 DIAGNOSIS — Z23 Encounter for immunization: Secondary | ICD-10-CM | POA: Diagnosis not present

## 2023-11-20 NOTE — Progress Notes (Signed)
 Patient is in office today for a nurse visit for FLU Immunization. Patient Injection was given in the  Left deltoid. Patient tolerated injection well.

## 2023-11-20 NOTE — Patient Instructions (Signed)

## 2023-12-04 ENCOUNTER — Inpatient Hospital Stay: Attending: Internal Medicine

## 2023-12-04 ENCOUNTER — Encounter (HOSPITAL_COMMUNITY): Payer: Self-pay

## 2023-12-04 ENCOUNTER — Ambulatory Visit (HOSPITAL_COMMUNITY)
Admission: RE | Admit: 2023-12-04 | Discharge: 2023-12-04 | Disposition: A | Discharge status: Home / Self Care | Source: Ambulatory Visit | Attending: Internal Medicine | Admitting: Internal Medicine

## 2023-12-04 DIAGNOSIS — C3490 Malignant neoplasm of unspecified part of unspecified bronchus or lung: Secondary | ICD-10-CM | POA: Diagnosis not present

## 2023-12-04 DIAGNOSIS — C342 Malignant neoplasm of middle lobe, bronchus or lung: Secondary | ICD-10-CM | POA: Diagnosis present

## 2023-12-04 DIAGNOSIS — R918 Other nonspecific abnormal finding of lung field: Secondary | ICD-10-CM | POA: Diagnosis not present

## 2023-12-04 DIAGNOSIS — R634 Abnormal weight loss: Secondary | ICD-10-CM | POA: Diagnosis not present

## 2023-12-04 DIAGNOSIS — J432 Centrilobular emphysema: Secondary | ICD-10-CM | POA: Insufficient documentation

## 2023-12-04 DIAGNOSIS — Z923 Personal history of irradiation: Secondary | ICD-10-CM | POA: Insufficient documentation

## 2023-12-04 DIAGNOSIS — R911 Solitary pulmonary nodule: Secondary | ICD-10-CM | POA: Diagnosis not present

## 2023-12-04 LAB — CBC WITH DIFFERENTIAL (CANCER CENTER ONLY)
Abs Immature Granulocytes: 0.01 K/uL (ref 0.00–0.07)
Basophils Absolute: 0.1 K/uL (ref 0.0–0.1)
Basophils Relative: 1 %
Eosinophils Absolute: 0.2 K/uL (ref 0.0–0.5)
Eosinophils Relative: 3 %
HCT: 45.6 % (ref 39.0–52.0)
Hemoglobin: 15.6 g/dL (ref 13.0–17.0)
Immature Granulocytes: 0 %
Lymphocytes Relative: 45 %
Lymphs Abs: 2.9 K/uL (ref 0.7–4.0)
MCH: 28.7 pg (ref 26.0–34.0)
MCHC: 34.2 g/dL (ref 30.0–36.0)
MCV: 83.8 fL (ref 80.0–100.0)
Monocytes Absolute: 0.5 K/uL (ref 0.1–1.0)
Monocytes Relative: 9 %
Neutro Abs: 2.7 K/uL (ref 1.7–7.7)
Neutrophils Relative %: 42 %
Platelet Count: 197 K/uL (ref 150–400)
RBC: 5.44 MIL/uL (ref 4.22–5.81)
RDW: 15.6 % — ABNORMAL HIGH (ref 11.5–15.5)
WBC Count: 6.4 K/uL (ref 4.0–10.5)
nRBC: 0 % (ref 0.0–0.2)

## 2023-12-04 LAB — CMP (CANCER CENTER ONLY)
ALT: 20 U/L (ref 0–44)
AST: 18 U/L (ref 15–41)
Albumin: 4.1 g/dL (ref 3.5–5.0)
Alkaline Phosphatase: 107 U/L (ref 38–126)
Anion gap: 5 (ref 5–15)
BUN: 14 mg/dL (ref 8–23)
CO2: 27 mmol/L (ref 22–32)
Calcium: 10 mg/dL (ref 8.9–10.3)
Chloride: 106 mmol/L (ref 98–111)
Creatinine: 1.1 mg/dL (ref 0.61–1.24)
GFR, Estimated: >60 mL/min (ref >60)
Glucose, Bld: 115 mg/dL — ABNORMAL HIGH (ref 70–99)
Potassium: 4.2 mmol/L (ref 3.5–5.1)
Sodium: 138 mmol/L (ref 135–145)
Total Bilirubin: 0.7 mg/dL (ref 0.0–1.2)
Total Protein: 7.6 g/dL (ref 6.5–8.1)

## 2023-12-04 MED ORDER — IOHEXOL 300 MG/ML  SOLN
75.0000 mL | Freq: Once | INTRAMUSCULAR | Status: AC | PRN
  Administered 2023-12-04: 75 mL via INTRAVENOUS

## 2023-12-04 MED ORDER — SODIUM CHLORIDE (PF) 0.9 % IJ SOLN
INTRAMUSCULAR | Status: AC
  Filled 2023-12-04: qty 50

## 2023-12-06 ENCOUNTER — Other Ambulatory Visit (HOSPITAL_COMMUNITY): Payer: Self-pay

## 2023-12-13 ENCOUNTER — Inpatient Hospital Stay: Admitting: Internal Medicine

## 2023-12-13 VITALS — BP 98/60 | HR 85 | Temp 97.8°F | Resp 17 | Ht 72.0 in | Wt 168.3 lb

## 2023-12-13 DIAGNOSIS — C3490 Malignant neoplasm of unspecified part of unspecified bronchus or lung: Secondary | ICD-10-CM

## 2023-12-13 DIAGNOSIS — J432 Centrilobular emphysema: Secondary | ICD-10-CM | POA: Diagnosis not present

## 2023-12-13 DIAGNOSIS — C342 Malignant neoplasm of middle lobe, bronchus or lung: Secondary | ICD-10-CM | POA: Diagnosis not present

## 2023-12-13 DIAGNOSIS — R911 Solitary pulmonary nodule: Secondary | ICD-10-CM | POA: Diagnosis not present

## 2023-12-13 DIAGNOSIS — Z923 Personal history of irradiation: Secondary | ICD-10-CM | POA: Diagnosis not present

## 2023-12-13 DIAGNOSIS — R634 Abnormal weight loss: Secondary | ICD-10-CM | POA: Diagnosis not present

## 2023-12-13 NOTE — Progress Notes (Signed)
 [facility] [phone]   [phone]  OFFICE PROGRESS NOTE  [name], MD [address] [place] [address]  DIAGNOSIS: Stage IA (T1c, N0, M0) non-small cell lung cancer, squamous cell carcinoma presented with right middle lobe lung nodule    PRIOR THERAPY: Status post SBRT under the care of Dr. Izell in January 2025  CURRENT THERAPY: Observation  INTERVAL HISTORY: Peter Sims 73 y.o. male returns to the clinic today for follow-up visit accompanied by his wife. Discussed the use of AI scribe software for clinical note transcription with the patient, who gave verbal consent to proceed.  History of Present Illness Peter Sims is a 73 year old male with stage one non-small cell lung cancer who presents for evaluation with repeat CT scan of the chest for restaging of his disease. He is accompanied by his wife.  He experiences unintentional weight loss, which is under investigation by his primary care physician, though no cause has been identified yet. No recent colonoscopy or endoscopy has been performed, and he has no known thyroid  issues. He has undergone various tests, all of which have returned normal results.  He is retired and maintains a routine of light physical activities such as working around [employer], cutting grass, and using tools. No increase in physical activity recently.     MEDICAL HISTORY: Past Medical History:  Diagnosis Date   Arthritis    Bullous emphysema (HCC)    Severe Bilateral   Cocaine abuse (HCC)    Colitis    with bleeding   COPD (chronic obstructive pulmonary disease) (HCC)    Coronary artery disease    Dyslipidemia associated with type 2 diabetes mellitus (HCC) 06/23/2020   His a1c has been elevated in the past. I will recheck this today. He is encouraged to limit his intake of sweetened beverages, including diet drinks.    Dyspnea    GERD (gastroesophageal reflux  disease)    Headache    sinusitis   Hypertension    LBP (low back pain)    now chronic   Lumbago    Lung mass    Right Upper Lobe pleural based mass, negative  on PET Scan   Myocardial infarction [facility])    Neck pain    nerve pain    Neuromuscular disorder (HCC)    Neuropathy    right legf, calf, foot   Pneumonia    hx   S/P angioplasty with stent 01/13/20 emergent DES to mLAD  01/15/2020   Shortness of breath 03/03/2020   Spine pain    nerve   Therapeutic drug monitoring    Tobacco abuse     ALLERGIES:  is allergic to penicillins.  MEDICATIONS:  Current Outpatient Medications  Medication Sig Dispense Refill   acetaminophen  (TYLENOL ) 325 MG tablet Take 2 tablets (650 mg total) by mouth every 4 (four) hours as needed for headache or mild pain. (Patient taking differently: Take 325 mg by mouth every 4 (four) hours as needed for headache or mild pain (pain score 1-3).)     albuterol  (PROVENTIL ) (2.5 MG/3ML) 0.083% nebulizer solution Take 3 mLs (2.5 mg total) by nebulization every 6 (six) hours as needed for wheezing or shortness of breath. 75 mL 2   albuterol  (VENTOLIN  HFA) 108 (90 Base) MCG/ACT inhaler Inhale 2 puffs into the lungs every 6 (six) hours as needed for wheezing or shortness of breath. 6.7 g 2   aspirin  81 MG  chewable tablet Chew 1 tablet (81 mg total) by mouth daily.     dapagliflozin propanediol (FARXIGA) 5 MG TABS tablet Take 5 mg by mouth daily.     diclofenac  Sodium (VOLTAREN  ARTHRITIS PAIN) 1 % GEL Apply 2 g topically daily as needed (pain).     famotidine  (PEPCID ) 40 MG tablet Take 1 tablet (40 mg total) by mouth every evening. 90 tablet 2   Fluticasone -Umeclidin-Vilant (TRELEGY ELLIPTA ) 100-62.5-25 MCG/ACT AEPB Inhale 1 puff into the lungs daily. 60 each 11   metoprolol  succinate (TOPROL  XL) 25 MG 24 hr tablet Take 1/2 tablet (12.5 mg total) by mouth daily. 90 tablet 2   montelukast  (SINGULAIR ) 10 MG tablet Take 1 tablet (10 mg total) by mouth every evening. 30  tablet 11   naphazoline-glycerin  (CLEAR EYES REDNESS) 0.012-0.25 % SOLN Place 1-2 drops into both eyes 4 (four) times daily as needed (Dry eyes).     nicotine  polacrilex (NICORETTE ) 2 MG lozenge Use one lozenge every 2-3 hours as needed (Patient taking differently: Take 2 mg by mouth daily.) 72 tablet 0   nitroGLYCERIN  (NITROSTAT ) 0.4 MG SL tablet Place 1 tablet (0.4 mg total) under the tongue every 5 (five) minutes as needed for chest pain. 25 tablet 4   Respiratory Therapy Supplies (FLUTTER) DEVI Use as directed 1 each 0   rosuvastatin  (CRESTOR ) 10 MG tablet Take 1 tablet (10 mg total) by mouth daily. 90 tablet 3   tamsulosin  (FLOMAX ) 0.4 MG CAPS capsule Take 2 capsules (0.8 mg total) by mouth at bedtime. Pt needs follow up for additional refills 90 capsule 2   tamsulosin  (FLOMAX ) 0.4 MG CAPS capsule Take 2 capsules (0.8 mg total) by mouth daily. 180 capsule 0   valsartan  (DIOVAN ) 80 MG tablet Take 1 tablet (80 mg total) by mouth daily. 90 tablet 1   No current facility-administered medications for this visit.    SURGICAL HISTORY:  Past Surgical History:  Procedure Laterality Date   BRONCHIAL BIOPSY  01/22/2023   Procedure: BRONCHIAL BIOPSIES;  Surgeon: Shelah Lamar RAMAN, MD;  Location: [facility] ENDOSCOPY;  Service: Pulmonary;;   BRONCHIAL BRUSHINGS  01/22/2023   Procedure: BRONCHIAL BRUSHINGS;  Surgeon: Shelah Lamar RAMAN, MD;  Location: [facility] ENDOSCOPY;  Service: Pulmonary;;   BRONCHIAL NEEDLE ASPIRATION BIOPSY  01/22/2023   Procedure: BRONCHIAL NEEDLE ASPIRATION BIOPSIES;  Surgeon: Shelah Lamar RAMAN, MD;  Location[facility] ENDOSCOPY;  Service: Pulmonary;;   BRONCHIAL WASHINGS  01/22/2023   Procedure: BRONCHIAL WASHINGS;  Surgeon: Shelah Lamar RAMAN, MD;  Location: [facility] ENDOSCOPY;  Service: Pulmonary;;   CARDIAC CATHETERIZATION     COLONOSCOPY     CORONARY/GRAFT ACUTE MI REVASCULARIZATION N/A 01/14/2020   Procedure: Coronary/Graft Acute MI Revascularization;  Surgeon: Wonda Sharper, MD;  Location: [facility] INVASIVE  CV LAB;  Service: Cardiovascular;  Laterality: N/A;   FIDUCIAL MARKER PLACEMENT  01/22/2023   Procedure: FIDUCIAL MARKER PLACEMENT;  Surgeon: Shelah Lamar RAMAN, MD;  Location: [facility] ENDOSCOPY;  Service: Pulmonary;;   LEFT HEART CATH AND CORONARY ANGIOGRAPHY N/A 01/14/2020   Procedure: LEFT HEART CATH AND CORONARY ANGIOGRAPHY;  Surgeon: Wonda Sharper, MD;  Location: [facility] INVASIVE CV LAB;  Service: Cardiovascular;  Laterality: N/A;   LEFT HEART CATH AND CORONARY ANGIOGRAPHY N/A 12/13/2020   Procedure: LEFT HEART CATH AND CORONARY ANGIOGRAPHY;  Surgeon: Wonda Sharper, MD;  Location: [facility] INVASIVE CV LAB;  Service: Cardiovascular;  Laterality: N/A;   LEFT HEART CATH AND CORONARY ANGIOGRAPHY N/A 09/18/2023   Procedure: LEFT HEART CATH AND CORONARY ANGIOGRAPHY;  Surgeon: Wonda Sharper,  MD;  Location: [facility] INVASIVE CV LAB;  Service: Cardiovascular;  Laterality: N/A;   LUMBAR LAMINECTOMY/DECOMPRESSION MICRODISCECTOMY Right 05/03/2016   Procedure: MICRODISCECTOMY LUMBAR FIVE- SACRAL ONE RIGHT;  Surgeon: Rockey Peru, MD;  Location: [facility] OR;  Service: Neurosurgery;  Laterality: Right;   NO PAST SURGERIES      REVIEW OF SYSTEMS:  Constitutional: positive for fatigue Eyes: negative Ears, nose, mouth, throat, and face: negative Respiratory: positive for dyspnea on exertion Cardiovascular: negative Gastrointestinal: negative Genitourinary:negative Integument/breast: negative Hematologic/lymphatic: negative Musculoskeletal:negative Neurological: negative Behavioral/Psych: negative Endocrine: negative Allergic/Immunologic: negative   PHYSICAL EXAMINATION: General appearance: alert, cooperative, fatigued, and no distress Head: Normocephalic, without obvious abnormality, atraumatic Neck: no adenopathy, no JVD, supple, symmetrical, trachea midline, and thyroid  not enlarged, symmetric, no tenderness/mass/nodules Lymph nodes: Cervical, supraclavicular, and axillary nodes normal. Resp: clear to auscultation  bilaterally Back: symmetric, no curvature. ROM normal. No CVA tenderness. Cardio: regular rate and rhythm, S1, S2 normal, no murmur, click, rub or gallop GI: soft, non-tender; bowel sounds normal; no masses,  no organomegaly Extremities: extremities normal, atraumatic, no cyanosis or edema Neurologic: Alert and oriented X 3, normal strength and tone. Normal symmetric reflexes. Normal coordination and gait  ECOG PERFORMANCE STATUS: 1 - Symptomatic but completely ambulatory  Temperature 97.8 F (36.6 C), resp. rate 17, height 6' (1.829 m), weight 168 lb 4.8 oz (76.3 kg).  LABORATORY DATA: Lab Results  Component Value Date   WBC 6.4 12/04/2023   HGB 15.6 12/04/2023   HCT 45.6 12/04/2023   MCV 83.8 12/04/2023   PLT 197 12/04/2023      Chemistry      Component Value Date/Time   NA 138 12/04/2023 0935   NA 139 08/28/2023 1555   K 4.2 12/04/2023 0935   CL 106 12/04/2023 0935   CO2 27 12/04/2023 0935   BUN 14 12/04/2023 0935   BUN 18 08/28/2023 1555   CREATININE 1.10 12/04/2023 0935   CREATININE 1.17 05/25/2023 1551   GLU 100 04/04/2017 0000      Component Value Date/Time   CALCIUM  10.0 12/04/2023 0935   ALKPHOS 107 12/04/2023 0935   AST 18 12/04/2023 0935   ALT 20 12/04/2023 0935   BILITOT 0.7 12/04/2023 0935       RADIOGRAPHIC STUDIES: CT Chest W Contrast Result Date: 12/06/2023 CLINICAL DATA:  Restaging of non-small-cell lung cancer. * Tracking Code: BO * EXAM: CT CHEST WITH CONTRAST TECHNIQUE: Multidetector CT imaging of the chest was performed during intravenous contrast administration. RADIATION DOSE REDUCTION: This exam was performed according to the departmental dose-optimization program which includes automated exposure control, adjustment of the mA and/or kV according to patient size and/or use of iterative reconstruction technique. CONTRAST:  75mL OMNIPAQUE  IOHEXOL  300 MG/ML  SOLN COMPARISON:  06/04/2023 FINDINGS: Cardiovascular: Aortic atherosclerosis. Normal heart  size, without pericardial effusion. Multivessel coronary artery atherosclerosis. No central pulmonary embolism, on this non-dedicated study. Mediastinum/Nodes: No supraclavicular adenopathy. Prominent but not pathologically sized middle mediastinal nodes are similar. No hilar adenopathy. Lungs/Pleura: No pleural fluid. The pleural-based lesion within the anterolateral right hemithorax measures 6.7 x 1.8 cm on image 56/2. Compare 6.5 x 2.2 cm on the prior exam when remeasured in a similar fashion, suggesting stability. Advanced centrilobular emphysema. Inferior right upper lobe peribronchovascular area of soft tissue thickening/increased density is new at on the order of 1.5 x 1.3 cm on image 80/8. The nodule along the segmental bronchus of the lateral right middle lobe is similar, estimated at 12 x 7 mm on 103/8. Adjacent presumed radiation  and/or biopsy fiducial. Medial right lower lobe 4 mm pulmonary nodule on image 107/8 is new since the prior Just caudal to this, a peribronchovascular right lower lobe 7 mm nodule on image 112/8 is also new. Posterior left upper lobe surgical sutures. A pleural-based fluid density structure along the medial aspect of the left upper lobe measures 1.5 x 1.0 cm on 49/2 versus 1.7 x 1.1 cm on the prior exam. Upper Abdomen: Normal imaged portions of the liver, spleen, stomach, pancreas, gallbladder, adrenal glands, kidneys. Musculoskeletal: No acute osseous abnormality. IMPRESSION: 1. Similar appearance of left upper lobe resection site and right middle lobe peribronchovascular nodule. 2 right lower lobe pulmonary nodules of maximally 7 mm are new, suspicious for metastatic disease. 2. An inferior right upper lobe 1.5 cm area of peribronchovascular interstitial thickening/increased density is more indeterminate and could be infectious/inflammatory. Metastatic disease felt unlikely. Developing metachronous primary possible. 3. No thoracic adenopathy 4. Pleural-based lesions within the  upper hemithoraces bilaterally were not tracer avid on prior PET and are likely benign. 5. Aortic atherosclerosis (ICD10-I70.0), coronary artery atherosclerosis and emphysema (ICD10-J43.9). Electronically Signed   By: Rockey Kilts M.D.   On: 12/06/2023 14:56    ASSESSMENT AND PLAN: This is a very pleasant 73 years old African-American male with stage Ia (T1c, N0, M0) non-small cell lung cancer, squamous cell carcinoma presented with right middle lobe lung nodule status post SBRT under the care of Dr. Izell in January 2025 and the patient is currently on observation.  He had repeat CT scan of the chest performed recently.  I personally independently reviewed the scan images and discussed the result and showed the images to the patient and his wife. His scan showed no concerning findings for disease progression except for a new 7 mm right lower lobe pulmonary nodule that need close monitoring. Assessment and Plan Assessment & Plan Small cell lung cancer, status post SBRT, under surveillance Stage I small cell lung cancer in the right middle lobe, previously treated with SBRT in January 2025. Current CT scan shows the treated nodule appears similar, consistent with post-radiation changes and scarring. No significant changes in the previously treated area. - Continue surveillance with periodic imaging.  New right lung pulmonary nodule under surveillance New 7 mm nodule identified in the right lung, not present on previous imaging. Differential diagnosis includes possible malignancy versus inflammatory changes. Due to the presence of emphysema and multiple nodules, the etiology is uncertain. - Schedule follow-up CT scan in 3 months to monitor the new nodule. - If the nodule shows growth, refer back to Dr. Izell for potential further treatment.  Emphysema Significant emphysema noted on imaging, contributing to the complexity of lung findings.  Unintentional weight loss Reports unintentional weight  loss. Primary care physician is investigating the cause, but no definitive diagnosis has been made. No recent colonoscopy or endoscopy performed. No thyroid  issues reported. The patient voices understanding of current disease status and treatment options and is in agreement with the current care plan.  All questions were answered. The patient knows to call the clinic with any problems, questions or concerns. We can certainly see the patient much sooner if necessary.  The total time spent in the appointment was 30 minutes including review of chart and various tests results, discussions about plan of care and coordination of care plan .   Disclaimer: This note was dictated with voice recognition software. Similar sounding words can inadvertently be transcribed and may not be corrected upon review.

## 2023-12-14 ENCOUNTER — Telehealth: Payer: Self-pay | Admitting: Internal Medicine

## 2023-12-14 NOTE — Telephone Encounter (Signed)
 Left the patient a voicemail with the scheduled appointment details.

## 2024-01-10 ENCOUNTER — Other Ambulatory Visit (HOSPITAL_COMMUNITY): Payer: Self-pay

## 2024-01-10 ENCOUNTER — Other Ambulatory Visit: Payer: Self-pay | Admitting: Internal Medicine

## 2024-01-10 MED ORDER — METOPROLOL SUCCINATE ER 25 MG PO TB24
12.5000 mg | ORAL_TABLET | Freq: Every day | ORAL | 2 refills | Status: AC
Start: 2024-01-10 — End: ?
  Filled 2024-01-10: qty 45, 90d supply, fill #0

## 2024-01-15 ENCOUNTER — Other Ambulatory Visit: Payer: Self-pay | Admitting: Internal Medicine

## 2024-01-15 ENCOUNTER — Other Ambulatory Visit (HOSPITAL_COMMUNITY): Payer: Self-pay

## 2024-01-15 MED ORDER — DAPAGLIFLOZIN PROPANEDIOL 5 MG PO TABS
5.0000 mg | ORAL_TABLET | Freq: Every day | ORAL | 0 refills | Status: AC
  Filled 2024-01-15: qty 90, 90d supply, fill #0

## 2024-01-16 ENCOUNTER — Other Ambulatory Visit: Payer: Self-pay

## 2024-01-16 DIAGNOSIS — E1169 Type 2 diabetes mellitus with other specified complication: Secondary | ICD-10-CM

## 2024-01-17 ENCOUNTER — Telehealth: Payer: Self-pay | Admitting: Pharmacist

## 2024-01-17 ENCOUNTER — Other Ambulatory Visit (HOSPITAL_COMMUNITY): Payer: Self-pay

## 2024-01-17 DIAGNOSIS — E1169 Type 2 diabetes mellitus with other specified complication: Secondary | ICD-10-CM

## 2024-01-17 NOTE — Progress Notes (Addendum)
 01/17/2024  [name], male   DOB: 1950/06/04, 73 y.o.   MRN: [id number]  Patient was called regarding medication assistance with Farxiga .  HIPAA identifiers were obtained.  Peter Sims is a 73 year old male with multiple medical conditions including but not limited to:  type 2 diabetes, hyperlipidemia, arthritis, hypertension, coronary artery disease, GERD, history of MI, COPD, and chronic back pain.  His medications were reviewed:  Medications Reviewed Today     Reviewed by Jolee Cassius JINNY, [facility] (Pharmacist) on 01/17/24 at 1457  Med List Status: <None>   Medication Order Taking? Sig Documenting Provider Last Dose Status Informant  acetaminophen  (TYLENOL ) 325 MG tablet [id number] Yes Take 2 tablets (650 mg total) by mouth every 4 (four) hours as needed for headache or mild pain. Marylu Leita SAUNDERS, NP  Active Self  albuterol  (PROVENTIL ) (2.5 MG/3ML) 0.083% nebulizer solution [id number] Yes Take 3 mLs (2.5 mg total) by nebulization every 6 (six) hours as needed for wheezing or shortness of breath. Jude Harden GAILS, MD  Active Self  albuterol  (VENTOLIN  HFA) 108 [phone] Base) MCG/ACT inhaler [id number] Yes Inhale 2 puffs into the lungs every 6 (six) hours as needed for wheezing or shortness of breath. [name], MD  Active Self  aspirin  81 MG chewable tablet [id number] Yes Chew 1 tablet (81 mg total) by mouth daily. Marylu Leita SAUNDERS, NP  Active Self           Med Note JACKOLYN WADDELL DEL   Wed Jan 24, 2023  8:10 AM)    dapagliflozin  propanediol (FARXIGA ) 5 MG TABS tablet [id number]  Take 1 tablet (5 mg total) by mouth daily.  Patient not taking: Reported on 01/17/2024   [name], MD  Active   diclofenac  Sodium (VOLTAREN  ARTHRITIS PAIN) 1 % GEL [id number] Yes Apply 2 g topically daily as needed (pain). [provider]  Active Self  famotidine  (PEPCID ) 40 MG tablet [id number] Yes Take 1 tablet (40 mg total) by mouth every evening. [name], MD  Active Self   Fluticasone -Umeclidin-Vilant (TRELEGY ELLIPTA ) 100-62.5-25 MCG/ACT AEPB [id number] Yes Inhale 1 puff into the lungs daily. Perri Ronal JINNY, MD  Active Self  metoprolol  succinate (TOPROL  XL) 25 MG 24 hr tablet [id number] Yes Take 1/2 tablet (12.5 mg total) by mouth daily. [name], MD  Active   montelukast  (SINGULAIR ) 10 MG tablet [id number] Yes Take 1 tablet (10 mg total) by mouth every evening. [name], MD  Active Self  naphazoline-glycerin  (CLEAR EYES REDNESS) 0.012-0.25 % SOLN [id number] Yes Place 1-2 drops into both eyes 4 (four) times daily as needed (Dry eyes). [provider]  Active Self  nicotine  polacrilex (NICORETTE ) 2 MG lozenge [id number] Yes Use one lozenge every 2-3 hours as needed [name], MD  Active Self  nitroGLYCERIN  (NITROSTAT ) 0.4 MG SL tablet [id number] Yes Place 1 tablet (0.4 mg total) under the tongue every 5 (five) minutes as needed for chest pain. Raford Riggs, MD  Active Self    Discontinued 03/03/20 1640 (Change in therapy) Respiratory Therapy Supplies (FLUTTER) DEVI [id number] Yes Use as directed Darlean Peter NOVAK, MD  Active Self  rosuvastatin  (CRESTOR ) 10 MG tablet [id number] Yes Take 1 tablet (10 mg total) by mouth daily. Vannie Reche RAMAN, NP  Active Self  tamsulosin  (FLOMAX ) 0.4 MG CAPS capsule [id number] Yes Take 2 capsules (0.8 mg total) by mouth at bedtime. Pt needs follow up for additional refills [name], MD  Active   tamsulosin  (FLOMAX ) 0.4 MG CAPS capsule  [id number]  Take 2 capsules (0.8 mg total) by mouth daily.   Active   valsartan  (DIOVAN ) 80 MG tablet [id number] Yes Take 1 tablet (80 mg total) by mouth daily. Vannie Reche RAMAN, NP  Active Self            Lab Results  Component Value Date   HGBA1C 6.5 (H) 10/24/2023   HGBA1C 6.5 (H) 06/20/2023   HGBA1C 6.4 (H) 02/07/2023   On statin therapy- Rosuvastatin  10 mg (current fill) On Valsartan  80 mg  (current fill)  Findings:  Called [facility]- Spoke with a  Technician-Farxiga had been run through with a coupon.  The coapy was still around $125  Called [employer].the benefit structure for medications that are tier 3 is 20% of the total cost of the medication with a minimum cost of $70 and a max of $375.  The insurance quoted a 90 day supply of Farxiga as $375 so the Pharmacy applying a coupon brought the price down.  Since the Patient has adult nurse Medicare, He is NOT eligible for Patient Chiropractor.  I offered to call the Pharmacy back to have them see how much a 30 day supply would be with the coupon but the Patient said he wanted to wait and speak with his Provider.  Plan: Route note to Provider. Follow up upon request.  Cassius DOROTHA Brought, PharmD, BCACP Clinical Pharmacist [phone] .

## 2024-01-25 ENCOUNTER — Other Ambulatory Visit (HOSPITAL_COMMUNITY): Payer: Self-pay

## 2024-02-05 ENCOUNTER — Ambulatory Visit: Payer: Self-pay

## 2024-02-05 NOTE — Telephone Encounter (Signed)
 FYI Only or Action Required?: Action required by provider: update on patient condition.  Patient was last seen in primary care on 10/24/2023 by [name], MD.  CTriage Disposition: Information or Advice Only Call  Patient/caregiver understands and will follow disposition?: No, wishes to speak with PCP   Copied from CRM [id number]. Topic: Clinical - Red Word Triage >> Feb 05, 2024 10:45 AM [name] wrote: Red Word that prompted transfer to Nurse Triage:  Consistent pain,  left side .[name] Feels like a lump. For about two weeks. Patient called to r/s his appt on Dec. 16th. Reason for Disposition  Health information question, no triage required and triager able to answer question    No triage: pt refused to allow nurse to help  Answer Assessment - Initial Assessment Questions 1. REASON FOR CALL: What is the main reason for your call? or How can I best help you?     Pt called asked if nurse was on site at [facility]: stated he did not trust anyone that was not on site : stated last time he left a message the office stated they never received the message/not aware of his call therefore pt stated he will call a family member that works at the site and attempt to get a message to his doctor that way.  Pt then disconnected.  Protocols used: Information Only Call - No Triage-A-AH

## 2024-02-12 ENCOUNTER — Ambulatory Visit: Payer: Self-pay | Admitting: Internal Medicine

## 2024-02-27 ENCOUNTER — Other Ambulatory Visit (HOSPITAL_COMMUNITY): Payer: Self-pay

## 2024-03-04 ENCOUNTER — Inpatient Hospital Stay: Payer: Self-pay | Attending: Internal Medicine

## 2024-03-10 ENCOUNTER — Telehealth: Payer: Self-pay | Admitting: Internal Medicine

## 2024-03-10 NOTE — Telephone Encounter (Signed)
 Attempted to call pt again to sched his lab and scan review appt no answer went ahead and scheduled them

## 2024-03-11 ENCOUNTER — Inpatient Hospital Stay: Admitting: Internal Medicine

## 2024-03-25 ENCOUNTER — Ambulatory Visit: Payer: Self-pay | Admitting: Internal Medicine

## 2024-04-09 ENCOUNTER — Ambulatory Visit (HOSPITAL_COMMUNITY): Payer: Self-pay

## 2024-04-09 ENCOUNTER — Inpatient Hospital Stay: Payer: Self-pay | Attending: Internal Medicine

## 2024-04-16 ENCOUNTER — Inpatient Hospital Stay: Payer: Self-pay | Admitting: Internal Medicine

## 2024-05-21 ENCOUNTER — Ambulatory Visit: Payer: Self-pay | Admitting: Internal Medicine

## 2024-10-28 ENCOUNTER — Encounter: Payer: Self-pay | Admitting: Internal Medicine
